# Patient Record
Sex: Female | Born: 1945 | Race: Black or African American | Hispanic: No
Health system: Midwestern US, Academic
[De-identification: ages and names within clinical notes are randomized; demographics above are authoritative.]

---

## 2018-05-26 ENCOUNTER — Encounter: Admit: 2018-05-26 | Discharge: 2018-05-26 | Payer: MEDICARE

## 2018-05-26 ENCOUNTER — Encounter: Admit: 2018-05-26 | Discharge: 2018-05-27 | Payer: MEDICARE

## 2018-05-26 DIAGNOSIS — R69 Illness, unspecified: Secondary | ICD-10-CM

## 2018-06-08 ENCOUNTER — Encounter: Admit: 2018-06-08 | Discharge: 2018-06-08 | Payer: MEDICARE

## 2018-06-10 ENCOUNTER — Encounter: Admit: 2018-06-10 | Discharge: 2018-06-11 | Payer: MEDICARE

## 2018-06-10 DIAGNOSIS — R69 Illness, unspecified: Principal | ICD-10-CM

## 2018-06-13 ENCOUNTER — Encounter: Admit: 2018-06-13 | Discharge: 2018-06-13 | Payer: MEDICARE

## 2018-06-13 DIAGNOSIS — R69 Illness, unspecified: Principal | ICD-10-CM

## 2018-06-16 ENCOUNTER — Encounter: Admit: 2018-06-16 | Discharge: 2018-06-16 | Payer: MEDICARE

## 2018-06-16 DIAGNOSIS — F419 Anxiety disorder, unspecified: ICD-10-CM

## 2018-06-16 DIAGNOSIS — I519 Heart disease, unspecified: ICD-10-CM

## 2018-06-16 DIAGNOSIS — M1990 Unspecified osteoarthritis, unspecified site: ICD-10-CM

## 2018-06-16 DIAGNOSIS — I10 Essential (primary) hypertension: ICD-10-CM

## 2018-06-16 DIAGNOSIS — R1900 Intra-abdominal and pelvic swelling, mass and lump, unspecified site: ICD-10-CM

## 2018-06-16 DIAGNOSIS — K862 Cyst of pancreas: Principal | ICD-10-CM

## 2018-06-16 DIAGNOSIS — H547 Unspecified visual loss: ICD-10-CM

## 2018-06-16 DIAGNOSIS — G4730 Sleep apnea, unspecified: ICD-10-CM

## 2018-06-16 DIAGNOSIS — R609 Edema, unspecified: ICD-10-CM

## 2018-06-16 DIAGNOSIS — E785 Hyperlipidemia, unspecified: ICD-10-CM

## 2018-06-16 DIAGNOSIS — I6521 Occlusion and stenosis of right carotid artery: ICD-10-CM

## 2018-06-16 DIAGNOSIS — J449 Chronic obstructive pulmonary disease, unspecified: ICD-10-CM

## 2018-06-16 DIAGNOSIS — J45909 Unspecified asthma, uncomplicated: ICD-10-CM

## 2018-06-16 DIAGNOSIS — E119 Type 2 diabetes mellitus without complications: ICD-10-CM

## 2018-06-16 DIAGNOSIS — M549 Dorsalgia, unspecified: ICD-10-CM

## 2018-06-16 DIAGNOSIS — C55 Malignant neoplasm of uterus, part unspecified: Principal | ICD-10-CM

## 2018-06-16 DIAGNOSIS — I2510 Atherosclerotic heart disease of native coronary artery without angina pectoris: ICD-10-CM

## 2018-06-16 NOTE — Progress Notes
Neurological: Positive for dizziness.   Psychiatric/Behavioral: Negative.          Objective:         ??? albuterol 0.083% (PROVENTIL) 2.5 mg /3 mL (0.083 %) nebulizer solution Inhale 2.5 mg by mouth into the lungs every 6 hours as needed.   ??? aspirin 325 mg tablet Take 325 mg by mouth.   ??? carvediloL (COREG) 25 mg tablet    ??? clopiDOGrel (PLAVIX) 75 mg tablet    ??? cyanocobalamin (vitamin B-12) (VITAMIN B-12 PO) Take  by mouth.   ??? dicyclomine (BENTYL) 10 mg capsule    ??? ferrous sulfate (FEOSOL) 325 mg (65 mg iron) tablet Take 325 mg by mouth daily. Take on an empty stomach at least 1 hour before or 2 hours after food.   ??? FLUoxetine (PROZAC) 20 mg capsule    ??? furosemide (LASIX) 40 mg tablet 1 Tab, EVERY OTHER DAY, PO, 45 Tab, 2 Number of Refills, 2, Route to Pharmacy Electronically, [facility], [id number]   ??? hydrALAZINE (APRESOLINE) 25 mg tablet 1 Tab, TID, PO, 270 Tab, 3 Number of Refills, 3, Route to Pharmacy Electronically, [id number]   ??? insulin aspart U-100 (NOVOLOG) 100 unit/mL injection Inject  under the skin.   ??? lisinopril (ZESTRIL) 10 mg tablet    ??? lovastatin (MEVACOR) 40 mg tablet    ??? metFORMIN (GLUCOPHAGE) 500 mg tablet    ??? potassium citrate (UROCIT-K) 10 mEq (1,080 mg) tablet Take 10 mEq by mouth.   ??? torsemide (DEMADEX) 20 mg tablet      Vitals:    06/16/18 1305   BP: 137/61   BP Source: Arm, Right Upper   Patient Position: Sitting   Pulse: 87   Resp: 18   Temp: 36.8 ???C (98.2 ???F)   TempSrc: Oral   SpO2: 100%   Weight: 93.9 kg (207 lb)   Height: 157.5 cm (62)   PainSc: Zero     Body mass index is 37.86 kg/m???.     Pain Score: Zero       Fatigue Scale: 0-None    Pain Addressed:  N/A    Patient Evaluated for a Clinical Trial: No treatment clinical trial available for this patient.     [place] Cooperative Oncology Group performance status is 2, Ambulatory and capable of all selfcare but unable to carry out any work activities. Up

## 2018-06-23 ENCOUNTER — Encounter: Admit: 2018-06-23 | Discharge: 2018-06-23 | Payer: MEDICARE

## 2018-11-14 ENCOUNTER — Encounter: Admit: 2018-11-14 | Discharge: 2018-11-14

## 2018-11-14 DIAGNOSIS — K862 Cyst of pancreas: Secondary | ICD-10-CM

## 2018-11-16 LAB — CREATININE

## 2018-11-16 LAB — BUN

## 2018-11-17 ENCOUNTER — Encounter: Admit: 2018-11-17 | Discharge: 2018-11-17

## 2018-11-17 DIAGNOSIS — K862 Cyst of pancreas: Secondary | ICD-10-CM

## 2018-12-08 ENCOUNTER — Encounter: Admit: 2018-12-08 | Discharge: 2018-12-08

## 2019-03-18 ENCOUNTER — Encounter: Admit: 2019-03-18 | Discharge: 2019-03-18 | Payer: MEDICARE

## 2020-01-05 ENCOUNTER — Encounter: Admit: 2020-01-05 | Discharge: 2020-01-05 | Payer: MEDICARE

## 2020-01-05 DIAGNOSIS — C9000 Multiple myeloma not having achieved remission: Secondary | ICD-10-CM

## 2020-01-05 NOTE — Telephone Encounter
Heme/BMT Navigation Intake Assessment Document    Patient Name:  Raven [name]  DOB:  11-Feb-1946    Date of Referral:  01/05/20    Diagnosis & Reason for Visit:  Transplant/Treatment Options - Myeloma       [place]-MR:  [id number] APPOINTMENT:  Future Appointments   Date Time Provider Department Center   01/24/2020  9:10 AM Pauletta Browns, MD [facility] [place] Exam   01/24/2020 10:15 AM BMT LAB [facility] [place] [facility]   01/24/2020 10:30 AM Johnnye Lana [place] Exam   01/24/2020 11:00 AM Pincus Large, LCSW [facility] [place] Exam     REFERRING PHYSICIAN:  Dr. Lyndee Hensen       FACILITY:  [facility]        CONTACT NAME:  Oleh Genin   PHONE:  [phone]                        INSURANCE:   [employer]     Location of Films:  PACS    Location of Pathology:  MAILED/COURIER and Patient notified outside pathology slides will be obtained for review by [place] pathologist and a facility and professional fee will be billed to their insurance.    NEEDS Assessment:    Genetic Counseling:  Genetic Assessment: Not assessed at this time        Nutrition:  Additional Nutrition Assessment: Not assessed at this time     Social & Financial:  Social and Financial Assessment: Reports adequate support system;No needs identified     Social and Financial Intervention: Referral placed to Child psychotherapist;Referral placed to Financial Counselor;Provided information about available services    Spiritual & Emotional:  Spiritual and Emotional Assessment: Reports adequate support system;No needs identified  Spiritual and Emotional Intervention: Provided information about available services;Services declined at this time    Physical:  Fall Risk: None identified     Communication:  Communication Barrier: No     Onc Fertility:   Onc Fertility Assessment: Not applicable     Additional Education:  Additional Education Documented: Yes    Patient Education    COVID-19 guidelines reviewed with patient, including: visitor and universal masking policies, and a temperature check at the facility entrance upon arrival.

## 2020-01-08 ENCOUNTER — Encounter: Admit: 2020-01-08 | Discharge: 2020-01-08 | Payer: MEDICARE

## 2020-01-09 ENCOUNTER — Encounter: Admit: 2020-01-09 | Discharge: 2020-01-09 | Payer: MEDICARE

## 2020-01-09 NOTE — Progress Notes
H&E Slides of ACC # unknown, Path Date 12/05/19 & 01/03/19, requested on 01/09/20 from  [facility].    Contact for outside path lab is [phone].     UPS shipping label faxed (fax # [phone]) with shipment tracking # [id number].

## 2020-01-12 ENCOUNTER — Encounter: Admit: 2020-01-12 | Discharge: 2020-01-12 | Payer: MEDICARE

## 2020-01-12 DIAGNOSIS — C9000 Multiple myeloma not having achieved remission: Secondary | ICD-10-CM

## 2020-01-18 ENCOUNTER — Encounter: Admit: 2020-01-18 | Discharge: 2020-01-18 | Payer: MEDICARE

## 2020-01-18 NOTE — Progress Notes
Onc Timeline Overview Note       Patient: Raven [name]   DOB: 02/01/2046  Diagnosis: Multiple myeloma  [place]-MR:  [id number] APPOINTMENT:  01/24/20 Dr. Tasia Catchings   REFERRING PHYSICIAN: Dr. Lyndee Hensen       FACILITY:  [facility]        CONTACT NAME:  Oleh Genin   PHONE:  [phone]                        INSURANCE:  [employer] Medicare   Allergies: Sulfa   Family Hx: Mother: pancreatic cancer. Sister: breast cancer. 2 sisters: uterine cancer. Sister: multiple myeloma   Medical Hx: HTN, DM, Asthma, uterine cancer 2011, sleep apnea, MI x2, anxiety, COPD, CAD, CHF, HLD, osteoarthritis.    Surgical Hx: Angioplasty, bilateral cataract removal, cardiac defibrillator, cardiac cath, coronary artery bypass, tubal ligation, hysterectomy.    Social Hx:  No alcohol or illicit drug use. Former smoker.    [name] is a 74 year old female who was referred to Dr. Donnajean Lopes in 2020 for anemia and CKD. Myeloma labs revealed an IgG kappa monoclonal protein of 1.5 g/dL. BMBX showed only about 5% plasma cells. PET scan was negative for lytic lesions. 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 Her creatinine began to increase and a BMBX was repeated in Aug 2021 showing 15-20% plasma cells.  Skeletal survey again did not show any lesions. UPEP did not reveal any abnormal proteins. Dr. Donnajean Lopes is considering starting CyBorD. It is unclear if her anemia and CKD are related to myeloma or not so he has referred her to discuss treatment recommendations and the role of autologous stem cell transplant.     Timeline of Events:          Multiple myeloma not having achieved remission (HCC)   11/16/2018 Pertinent Labs    WBC: 8.7 RBC: 3.09 HGB: 9.8 HCT: 29 PLT: 263 ANC: 4.1   BUN: 24 Creat: 1.66 Ca: 8.9      12/07/2018 Pertinent Labs    SPEP TP: 7.1 Alb: 4 Abn Prot Band: 1.4   IFES: IgG kappa  IgA: 50 IgG: 1679 IgM: 21   FKLC: 745.4 mg/L FLLC: 12.9 mg/L K/L Ratio: 57.78      01/03/2019 Imaging    Bone Survey  [facility]    IMPRESSION: No lytic or destructive bone lesion.      01/03/2019 Biopsy    BMBX  [facility]    Peripheral blood, bone marrow aspirate, biopsy and clot section:   1. Plasma cell proliferative disorder, with 4-5% kappa light chain restricted plasma cells.   2. Normocellular bone marrow with morphologically normal trilineage hematopoiesis. Unable to assess marrow storage iron.   3. The submitted peripheral blood smear shows increased targets and hypochromic red blood cells.     [place] Red Stain: Negative    mSMART FISH: High risk. 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          01/24/2019 Imaging    PET  [facility]    IMPRESSION:   1. Allowing for limitation of low resolution CT, no CT evidence of an osteolytic FDG avid lesion.   2. Nonspecific focally increased activity of the left lateral pterygoid muscle with associated anterior positioning of the left mandibular condyle. And imaging findings  may be related to muscle activation however consider follow-up CT with contrast if clinically indicated for further evaluation.   3. No suspicious lymphadenopathy.   4. Panniculitis.   5. No FDG activity within 2 small hepatic hypodensities, incompletely characterized within the right hepatic segment 4B and hepatic segment 8. Query for small cysts.   6. Severe pulmonary hypertension, diverticulosis, left internal oblique intramuscular/intermuscular lipoma.      04/12/2019 Pertinent Labs    WBC: 7.8 RBC: 3.66 HGB: 9.7 HCT: 31.2 PLT: 208 Gran#: 4.1   BUN: 44 Creat: 1.9 Ca: 9.9 TP: 7.5 Alb: 3.5   SPEP TP: 6.8 Alb: 3.7 Abn Prot Band: 1.4   IFES: IgG kappa  IgA: 51 IgG: 1821 IgM: 21   FKLC: 872 mg/L FLLC: 13.2 mg/L Ratio: 66.06      06/28/2019 Pertinent Labs    SPEP TP: 6.8 Alb: 3.8 Abn Prot Band: 1.4   IFES: IgG kappa  IgA: 45 IgG: 1839 IgM: 20   FKLC: 886.3 mg/L FLLC: 16.3 mg/L K/L Ratio: 54.37 08/23/2019 Pertinent Labs    WBC: 7.6 RBC: 3.14 HGB: 8.7 HCT: 27.8 PLT: 293 Gran#: 4.4   BUN: 35 Creat: 2.4 Ca: 10.1 TP: 7.4 Alb: 3.6   SPEP TP: 7 Alb: 3.8 Abn Prot Band: 1.4   IFES: IgG kappa   IgA: 45 IgG: 1720 IgM: 17  FKLC: 952.3 mg/L FLLC: 17 mg/L K/L ratio: 56.02      11/01/2019 Pertinent Labs    SPEP TP: 7.8 Alb: 4.1 Abn Prot Band: 1.9   IFES: IgG kappa  IgA: 49 IgG: 2226 IgM: 21  FKLC: 931.9 mg/L FLLC: 25.4 mg/L K/L ratio: 36.69      11/29/2019 Pertinent Labs    WBC: 9.1 RBC: 2.72 HGB: 7.6 HCT: 24.3 PLT: 258 Gran#: 5.7   BUN: 35 Creat: 2.33 Ca: 8.9 TP: 6.7 Alb: 3.3   SPEP TP: 6.7 ALB: 3.5 Abn Prot Band: 1.4   IFES: IgG kappa   IgA: 38 IgG: 1683 IgM: 17  FKLC: 823.4 mg/L FLLC: 17.8 mg/L K/L Ratio: 46.26   B2M: 9.95     12/01/2019 Imaging    Skeletal Survey  [facility]    Impression: Mild to moderate degenerative changes of the cervical, thoracic, or lumbar spine. No destructive or blastic bone lesions. There is cardiomegaly with evidence of mild CHF.      12/05/2019 Biopsy    BMBX  [facility]    Bone marrow aspirate, biopsy, cell clot and peripheral blood:  Peripheral blood with moderate normocytic normochromic anemia.  Moderately hypercellular bone marrow (50-60%) with mild erythroid hyperplasia and 15-20% kappa light chain restricted plasma cells, consistent with plasma cell neoplasm.   Flow Cytometry: Monoclonal plasma cells detected. 2.3%.    Cytogenetics: 46,XX[20]  MM FISH Panel: normal       12/20/2019 Pertinent Labs    Urine Measured Kappa Chains: <1  Urine Measured Lambda Chains: <1    UPEP TP: 247 mg/24hr ALB: 100 No abnormal protein  UIFE: No monoclonal immunoglobulin detected.   Creat 24hr urine: 1.1 g/24hr  Prot/Creat Ratio 224 mg/g     01/03/2020 Pertinent Labs    WBC: 9.4 RBC: 3.1 HGB: 8.8 HCT: 27.1 PLT: 189 Gran#: 6.1   BUN: 52 Creat: 2.75 Ca: 8.8 TP: 7.6 Alb: 3.5 AST: 29 ALT: 40 Alk Phos: 91

## 2020-01-24 ENCOUNTER — Encounter: Admit: 2020-01-24 | Discharge: 2020-01-24 | Payer: MEDICARE

## 2020-01-24 DIAGNOSIS — F419 Anxiety disorder, unspecified: Secondary | ICD-10-CM

## 2020-01-24 DIAGNOSIS — C9000 Multiple myeloma not having achieved remission: Secondary | ICD-10-CM

## 2020-01-24 DIAGNOSIS — I2510 Atherosclerotic heart disease of native coronary artery without angina pectoris: Secondary | ICD-10-CM

## 2020-01-24 DIAGNOSIS — C801 Malignant (primary) neoplasm, unspecified: Secondary | ICD-10-CM

## 2020-01-24 DIAGNOSIS — E785 Hyperlipidemia, unspecified: Secondary | ICD-10-CM

## 2020-01-24 DIAGNOSIS — I10 Essential (primary) hypertension: Secondary | ICD-10-CM

## 2020-01-24 DIAGNOSIS — M1990 Unspecified osteoarthritis, unspecified site: Secondary | ICD-10-CM

## 2020-01-24 DIAGNOSIS — I519 Heart disease, unspecified: Secondary | ICD-10-CM

## 2020-01-24 DIAGNOSIS — I6521 Occlusion and stenosis of right carotid artery: Secondary | ICD-10-CM

## 2020-01-24 DIAGNOSIS — E119 Type 2 diabetes mellitus without complications: Secondary | ICD-10-CM

## 2020-01-24 DIAGNOSIS — M549 Dorsalgia, unspecified: Secondary | ICD-10-CM

## 2020-01-24 DIAGNOSIS — I509 Heart failure, unspecified: Secondary | ICD-10-CM

## 2020-01-24 DIAGNOSIS — N183 Anemia due to stage 3 chronic kidney disease, unspecified whether stage 3a or 3b CKD (HCC): Secondary | ICD-10-CM

## 2020-01-24 DIAGNOSIS — J45909 Unspecified asthma, uncomplicated: Secondary | ICD-10-CM

## 2020-01-24 DIAGNOSIS — G4730 Sleep apnea, unspecified: Secondary | ICD-10-CM

## 2020-01-24 DIAGNOSIS — R609 Edema, unspecified: Secondary | ICD-10-CM

## 2020-01-24 DIAGNOSIS — J449 Chronic obstructive pulmonary disease, unspecified: Secondary | ICD-10-CM

## 2020-01-24 DIAGNOSIS — H547 Unspecified visual loss: Secondary | ICD-10-CM

## 2020-01-24 DIAGNOSIS — C55 Malignant neoplasm of uterus, part unspecified: Secondary | ICD-10-CM

## 2020-01-24 DIAGNOSIS — R1900 Intra-abdominal and pelvic swelling, mass and lump, unspecified site: Secondary | ICD-10-CM

## 2020-01-24 NOTE — Progress Notes
Continuum of Care Case Management Note         Blood and Marrow Transplant Clinic    Plan:   Spoke with patient for new patient visit.    Interventions:  Spoke with patient and her son for new patient visit.  Introduced the social work role on the BMT team.  Patient has a diagnosis of multiple myeloma and has been referred for transplant options.      Patient lives in [place]. Patient's on reports they are right over 30 minutes from [facility] and did not expect to need lodging. SW reviewed distance protocol and indicated medical team determines, family agreed they understood. Discussed the requirement for 24/7 caregiver support. SW will e-mail form for Advance Directive/DPOA, provided education on document and discussed that SW can assist with notarizing. SW briefly discuss financial assistance through [employer]. Patient reported she is unsure if transplant will occur. SW advised grants can be discussed in length once closer to transplant, patient agreed.     SW emailed DPOA and contact information to patient and her son at [email or web address].    Pincus Large LSCSW LCSW

## 2020-01-24 NOTE — Progress Notes
Raven [name] is a 75 y.o. female seen in initial consultation with Dr. Tasia Catchings.  [name], son, accompanied patient.     Diagnosis: IgG Kappa Multiple Myeloma ISS III  Date of diagnosis: 11/2019  Cytogenetics/FISH at diagnosis:   46,XX[20]    Pertinent medical/surgical hx:   Medical Hx: HTN, DM, Asthma, uterine cancer 2011, sleep apnea, MI x2, anxiety, COPD, CAD, CHF, HLD, osteoarthritis.    Surgical Hx: Angioplasty, bilateral cataract removal, cardiac defibrillator, cardiac cath, coronary artery bypass, tubal ligation, hysterectomy.    States only 1 operating kidney has generalized edema  Referring physician/phone: Dr. Emmie Niemann Pendurti??[phone], Dr. Kennedy Bucker Cardiologist  Prior therapy: surgery for uterine cancer  Current therapy: none  Plan: MM labs with Dr. Donnajean Lopes for further investigation  Testing: none today-MM labs to be done with Dr. Donnajean Lopes at her next visit 10/6  Research/studies: not discussed  Donor Options (if applicable): NA Auto  Social Situation:   Social Hx:  No alcohol or illicit drug use. Former smoker.    Lives with son who is retired from the [employer].  Insurance: [employer] education [employer] Medicare  Pretransplant Coordinator: Anell Barr, RN  Primary Transplant Physician: Dr. Tasia Catchings  Team Color: green      NEWS Score:  0  CMI:  ~10    Last Mammogram (Date/where)  [facility], [facility] 2020 now known as [place]  Last Colonoscopy (date)  2018     I have reviewed the distress thermometer and needs assessment, including physical, psychological, social, spiritual and behavioral needs.  The patient completed the assessment during this visit to identify psychosocial needs that may interfere with the patient's plan of care.  I have made the following referrals based on this assessment and discussion with the patient:  Presenter, broadcasting of Engineer, manufacturing, consents, and coordinator contact information given to patient.    Anell Barr, RN

## 2020-01-24 NOTE — Progress Notes
bName: Raven [name]          MRN: [id number]      DOB: 21-Mar-1946      AGE: 74 y.o.   DATE OF SERVICE: 01/24/2020    Subjective:             Reason for Visit:  New CA Pt      Raven [name] is a 74 y.o. female. Here for opinion on plasma cell dyscrasia    Cancer Staging  No matching staging information was found for the patient.    History of Present Illness      Onc Timeline Overview Note       Patient: Raven [name]   DOB: 02-05-2046  Diagnosis: Multiple myeloma  [place]-MR:  [id number] APPOINTMENT:  01/24/20 Dr. Tasia Catchings   REFERRING PHYSICIAN: Dr. Emmie Niemann Pendurti       FACILITY:  [facility]        CONTACT NAME:  Oleh Genin   PHONE:  [phone]                        INSURANCE:  [employer]   Allergies: Sulfa   Family Hx: Mother: pancreatic cancer. Sister: breast cancer. 2 sisters: uterine cancer. Sister: multiple myeloma   Medical Hx: HTN, DM, Asthma, uterine cancer 2011, sleep apnea, MI x2, anxiety, COPD, CAD- s/p angioplasty and CABG 12 years ago, CHF, HLD, osteoarthritis.     Surgical Hx: Angioplasty, bilateral cataract removal, cardiac defibrillator, cardiac cath, coronary artery bypass, tubal ligation, hysterectomy.   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    Social Hx:  No alcohol or illicit drug use. Former smoker.    [name] is a 74 year old female who was referred to Dr. Donnajean Lopes in 2020 for anemia and CKD. Myeloma labs revealed an IgG kappa monoclonal protein of 1.5 g/dL. BMBX showed only about 5% plasma cells. PET scan was negative for lytic lesions. 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 Her creatinine began to increase and a BMBX was repeated in Aug 2021 showing 15-20% plasma cells.  Skeletal survey again did not show any lesions. UPEP did not reveal any abnormal proteins. Dr. Donnajean Lopes is considering starting CyBorD. It is unclear if her anemia and CKD are related to myeloma or not so he has referred her to discuss treatment recommendations and the role of autologous stem cell transplant.     Timeline of Events:          Multiple myeloma not having achieved remission (HCC)   11/16/2018 Pertinent Labs    WBC: 8.7 RBC: 3.09 HGB: 9.8 HCT: 29 PLT: 263 ANC: 4.1   BUN: 24 Creat: 1.66 Ca: 8.9      12/07/2018 Pertinent Labs    SPEP TP: 7.1 Alb: 4 Abn Prot Band: 1.4   IFES: IgG kappa  IgA: 50 IgG: 1679 IgM: 21   FKLC: 745.4 mg/L FLLC: 12.9 mg/L K/L Ratio: 57.78      01/03/2019 Imaging    Bone Survey  [facility]    IMPRESSION: No lytic or destructive bone lesion.      01/03/2019 Biopsy    BMBX  [facility]    Peripheral blood, bone marrow aspirate, biopsy and clot section:   1. Plasma cell proliferative disorder, with 4-5% kappa light chain restricted plasma cells.   2. Normocellular bone marrow with morphologically  normal trilineage hematopoiesis. Unable to assess marrow storage iron.   3. The submitted peripheral blood smear shows increased targets and hypochromic red blood cells.     [place] Red Stain: Negative    mSMART FISH: High risk. 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          01/24/2019 Imaging    PET  [facility]    IMPRESSION:   1. Allowing for limitation of low resolution CT, no CT evidence of an osteolytic FDG avid lesion.   2. Nonspecific focally increased activity of the left lateral pterygoid muscle with associated anterior positioning of the left mandibular condyle. And imaging findings may be related to muscle activation however consider follow-up CT with contrast if clinically indicated for further evaluation.   3. No suspicious lymphadenopathy.   4. Panniculitis.   5. No FDG activity within 2 small hepatic hypodensities, incompletely characterized within the right hepatic segment 4B and hepatic segment 8. Query for small cysts.   6. Severe pulmonary hypertension, diverticulosis, left internal oblique intramuscular/intermuscular lipoma.      04/12/2019 Pertinent Labs    WBC: 7.8 RBC: 3.66 HGB: 9.7 HCT: 31.2 PLT: 208 Gran#: 4.1   BUN: 44 Creat: 1.9 Ca: 9.9 TP: 7.5 Alb: 3.5   SPEP TP: 6.8 Alb: 3.7 Abn Prot Band: 1.4   IFES: IgG kappa  IgA: 51 IgG: 1821 IgM: 21   FKLC: 872 mg/L FLLC: 13.2 mg/L Ratio: 66.06      06/28/2019 Pertinent Labs    SPEP TP: 6.8 Alb: 3.8 Abn Prot Band: 1.4   IFES: IgG kappa  IgA: 45 IgG: 1839 IgM: 20   FKLC: 886.3 mg/L FLLC: 16.3 mg/L K/L Ratio: 54.37      08/23/2019 Pertinent Labs    WBC: 7.6 RBC: 3.14 HGB: 8.7 HCT: 27.8 PLT: 293 Gran#: 4.4   BUN: 35 Creat: 2.4 Ca: 10.1 TP: 7.4 Alb: 3.6   SPEP TP: 7 Alb: 3.8 Abn Prot Band: 1.4   IFES: IgG kappa   IgA: 45 IgG: 1720 IgM: 17  FKLC: 952.3 mg/L FLLC: 17 mg/L K/L ratio: 56.02      11/01/2019 Pertinent Labs    SPEP TP: 7.8 Alb: 4.1 Abn Prot Band: 1.9   IFES: IgG kappa  IgA: 49 IgG: 2226 IgM: 21  FKLC: 931.9 mg/L FLLC: 25.4 mg/L K/L ratio: 36.69      11/29/2019 Pertinent Labs    WBC: 9.1 RBC: 2.72 HGB: 7.6 HCT: 24.3 PLT: 258 Gran#: 5.7   BUN: 35 Creat: 2.33 Ca: 8.9 TP: 6.7 Alb: 3.3   SPEP TP: 6.7 ALB: 3.5 Abn Prot Band: 1.4   IFES: IgG kappa   IgA: 38 IgG: 1683 IgM: 17  FKLC: 823.4 mg/L FLLC: 17.8 mg/L K/L Ratio: 46.26   B2M: 9.95     12/01/2019 Imaging    Skeletal Survey  [facility]    Impression: Mild to moderate degenerative changes of the cervical, thoracic, or lumbar spine. No destructive or blastic bone lesions. There is cardiomegaly with evidence of mild CHF.      12/05/2019 Biopsy    BMBX  [facility]    Bone marrow aspirate, biopsy, cell clot and peripheral blood:  Peripheral blood with moderate normocytic normochromic anemia.  Moderately hypercellular bone marrow (50-60%) with mild erythroid hyperplasia and 15-20% kappa light chain restricted plasma cells, consistent with plasma cell neoplasm.   Flow Cytometry: Monoclonal plasma cells detected. 2.3%.    Cytogenetics: 46,XX[20]  MM FISH Panel: normal       12/20/2019 Pertinent Labs    Urine Measured Kappa Chains: <1  Urine Measured Lambda Chains: <1    UPEP TP: 247 mg/24hr ALB: 100 No abnormal protein  UIFE: No monoclonal immunoglobulin detected.   Creat 24hr urine: 1.1 g/24hr  Prot/Creat Ratio 224 mg/g     01/03/2020 Pertinent Labs    WBC: 9.4 RBC: 3.1 HGB: 8.8 HCT: 27.1 PLT: 189 Gran#: 6.1   BUN: 52 Creat: 2.75 Ca: 8.8 TP: 7.6 Alb: 3.5 AST: 29 ALT: 40 Alk Phos: 91               Review of Systems   Constitutional: Negative.    HENT: Negative.    Eyes: Negative.    Respiratory: Positive for shortness of breath.    Cardiovascular: Positive for leg swelling (mild).   Gastrointestinal: Negative.    Endocrine: Negative.    Genitourinary: Negative.    Musculoskeletal: Negative.    Neurological: Negative.    Hematological: Negative.    Psychiatric/Behavioral: Negative.  Negative for confusion.   All other systems reviewed and are negative.      Objective:         ? albuterol 0.083% (PROVENTIL) 2.5 mg /3 mL (0.083 %) nebulizer solution Inhale 2.5 mg by mouth into the lungs every 6 hours as needed.   ? budesonide-formoterol HFA (SYMBICORT) 160-4.5 mcg/actuation inhalation Inhale 2 puffs by mouth into the lungs twice daily as needed.   ? carvediloL (COREG) 25 mg tablet Take 25 mg by mouth twice daily.   ? cholecalciferol (VITAMIN D) 1,000 units tablet Take 1,000 Units by mouth daily.   ? clopiDOGrel (PLAVIX) 75 mg tablet Take 75 mg by mouth daily.   ? cyanocobalamin (VITAMIN B-12) 1,000 mcg tablet Take 5,000 Units by mouth daily.   ? dicyclomine (BENTYL) 10 mg capsule Take 10 mg by mouth daily.   ? EMU OIL (BULK) MISC Use  as directed. Apply topically as needed   ? ferrous sulfate (FEOSOL) 325 mg (65 mg iron) tablet Take 325 mg by mouth daily. Take on an empty stomach at least 1 hour before or 2 hours after food.   ? FLUoxetine (PROZAC) 20 mg capsule Take 20 mg by mouth daily.   ? furosemide (LASIX) 40 mg tablet Take 20 mg by mouth every 48 hours.   ? hydrALAZINE (APRESOLINE) 50 mg tablet Take 50 mg by mouth three times daily. ? insulin aspart U-100 (NOVOLOG) 100 unit/mL injection Inject  under the skin. Per sliding scale   ? insulin detemir U-100(+) (LEVEMIR) 100 unit/mL vial Inject 25 Units under the skin at bedtime daily.   ? isosorbide mononitrate ER (IMDUR) 30 mg tablet Take 30 mg by mouth daily.   ? lisinopriL (ZESTRIL) 5 mg tablet Take 5 mg by mouth daily.   ? magnesium oxide (MAGOX) 400 mg (241.3 mg magnesium) tablet Take 400 mg by mouth daily.   ? nitroglycerin (NITROSTAT) 0.3 mg tablet Place 0.3 mg under tongue every 5 minutes as needed for Chest Pain.   ? potassium citrate (UROCIT-K) 10 mEq (1,080 mg) tablet Take 10 mEq by mouth daily.   ? rosuvastatin (CRESTOR) 20 mg tablet Take 20 mg by mouth daily.   ? torsemide (DEMADEX) 20 mg tablet Take 20 mg by mouth daily.       BP 138/74 (BP Source: Arm, Left Upper, Patient Position: Sitting)  - Pulse 73  - Temp 36.9 ?C (98.4 ?F) (Oral)  -  Resp 14  - Ht 158.8 cm (62.5)  - Wt 92.7 kg (204 lb 6.4 oz)  - SpO2 100%  - BMI 36.79 kg/m?   Wt Readings from Last 5 Encounters:   01/24/20 92.7 kg (204 lb 6.4 oz)   06/16/18 93.9 kg (207 lb)     Body mass index is 36.79 kg/m?Marland Kitchen     Pain Score: Zero       Fatigue Scale: 0-None    Pain Addressed:  N/A    Patient Evaluated for a Clinical Trial: No treatment clinical trial available for this patient.     Karnofsky Scale: 70% Cares for self; unable to do normal activity, or active work     Physical Exam  Vitals and nursing note reviewed.   HENT:      Head: Normocephalic and atraumatic.   Eyes:      Conjunctiva/sclera: Conjunctivae normal.   Cardiovascular:      Rate and Rhythm: Normal rate and regular rhythm.   Pulmonary:      Effort: Pulmonary effort is normal. No respiratory distress.      Breath sounds: Normal breath sounds.   Abdominal:      General: Bowel sounds are normal. There is no distension.      Palpations: Abdomen is soft.      Tenderness: There is no abdominal tenderness.   Musculoskeletal:         General: Swelling (mild) present. No tenderness. Normal range of motion.      Cervical back: Normal range of motion.   Skin:     General: Skin is warm.   Neurological:      Mental Status: She is alert and oriented to person, place, and time.      Gait: Gait is intact.   Psychiatric:         Mood and Affect: Affect normal.               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   Her anemia is likely related to CKD, and not to gammopathy.   She is on EPO and has not required transfusion for 2 months.     She has 2 of 3  Mayo 2018 criteria for high risk smoldering MM  Based on labs in August 2021 and time to progression is estimated around 29 months.   cytogenetics: Gain of 1q.     Smoldering MM high risk is defined as BM biopsy PC > 10%, IgG > 3gm/ IgA > 2gm, Bence Jones > 1gm.   The PETHEMA trial demonstrated a delayed Time to progression and improved PFS with revlimid/ dex in high risk smoldering MM setting, however no survival benefit was demonstrated. In her case, given multiple comorbidities, would agree with observation.               Active MM would be determined in this pt based on end organ involvement.   In her case, it is not clear that her renal insufficiency is related to gammopathy, given other comorbidities  A renal biopsy would help establish a diagnosis. Negative Bence jones favors other etiologies of renal failure.    If her renal function continues to decline despite optimal medical management  by nephrology, would consider a renal biopsy or empiric treatment as active Myeloma with CyborD regimen or revlimid (renally adjusted dosing) with velcade and dex.    If her anemia workup is negative and not responding to EPO/ trt for CKD, then it is reasonable to consider this as active myeloma and treat as above.         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   Anemia: Workup: Anemia of CKD  On retacrit.     FEN/Renal:  Evaluated creatinine-will monitor closely and renally dose meds, fluid as needed.    Endocrine:  N/A   DM    Cardiovascular:  CHF - On treatment.  Monitor   Imaging showing cardiomegaly and mild CHF  Recently had increased dose of diuretics.      Infectious Disease:  Continue prophylaxis anti-infectives    Pulmonary:  Shortness of Air   Likely sec to CHF    GI:  No active issues related to nausea, emesis or diarrhea    Integumentary:  N/A    Pain:  N/A    Psychology:  No active issues, monitor and offer support as needed.     Time 10 min chart review in clinic today, labs, pertinent imaging, prior provider note reviewed, 20 min with pt face to face performing ROS, PE and discussing current status and assessment and plan, 10 minutes post visit management and care coordination in clinic today. Total time = 60 min.  Discussed with Dr Donnajean Lopes.       Pauletta Browns, MD  Assistant Professor  Hematologic Malignancies and Cellular Therapy

## 2020-01-31 ENCOUNTER — Encounter: Admit: 2020-01-31 | Discharge: 2020-01-31 | Payer: MEDICARE

## 2020-01-31 NOTE — Telephone Encounter
Spoke with patient and son, who were getting ready for a visit with Dr. Donnajean Lopes today.  Notified them that Dr. Tasia Catchings spoke with Dr. Donnajean Lopes, and that Dr. Donnajean Lopes would be referring the patient to a nephrologist for further investigation into her kidney issues.  Also informed patient and son that if Dr. Donnajean Lopes would like the patient to be considered for a ASCT in the future that he will place another referral at that time.  Both patient and son verbalized understanding.

## 2022-06-05 ENCOUNTER — Encounter: Admit: 2022-06-05 | Discharge: 2022-06-05 | Payer: MEDICARE

## 2022-06-09 ENCOUNTER — Encounter: Admit: 2022-06-09 | Discharge: 2022-06-09 | Payer: MEDICARE

## 2022-06-11 ENCOUNTER — Encounter: Admit: 2022-06-11 | Discharge: 2022-06-11 | Payer: MEDICARE

## 2022-06-11 NOTE — Progress Notes
.  This prechart is intended to be a reference for patient appointments. Information is gathered from in chart as well as external records review. Information will be clarified/verified/updated in final documentation in the office visit.     Pre Clinic Pre Chart:    Problem List:  Rectal stricture  Multiple myeloma  Endometrial cancer    06/04/2022 - Med Onc:  11. She does continue to have issues with diverticulitis. She was actually admitted to the hospital in December 2023 for possible diverticulitis. She did have a positive fecal occult blood test at that time. CT scan done on April 16, 2022 showed a left anterior chest wall mass increasing in size with further ultrasound noting it to be either hematoma or possible abscess. General surgery was consulted and they did start her on IV Levaquin for possible chest wall abscess. She did subsequently undergo colonoscopy on June 03, 2022 for evaluation of a possible colonic mass. Due to significant tortuous sigmoid colon along with edema they were not able to reach the area of stricture. Patient then underwent a barium enema which showed short segment irregular lobulated circumferential wall thickening in the mid to proximal sigmoid colon measuring at least 4.7 cm in length with proximal and distal apple core morphology. Suspicious for malignant stricture. I will reach out to Dr. Broadus John regarding the plan for ongoing workup of this abnormality.     06/03/2022 - Barium Enema:  1.  Findings overall suspicious for malignant stricture extended short segment polypoid sigmoid colonic wall thickening concerning for neoplastic etiology.  Correlation with colonoscopy findings recommended.    06/03/2022 - Colonoscopy:  The colonoscope was advanced up to the sigmoid colon.  The findings are noted above.  The scope could not safely be passed any further.  The scope was slowly withdrawn and the entire mucosal surface distal was inspected.  Including retroflexion in the rectum. Scope was removed and barium enema was ordered.  Findings:  Because of the strain tortuosity of the sigmoid colon and edema, we are unable to reach the lesion seen on CT scan.  There was severe diverticular disease, but again could not not reach the level of the strictured area.    04/16/2022 - CT Abd/Pelv:  Left anterior chest wall mass has significantly increased in size, measuring 4.3 x 3.4 cm in diameter.  Differential consideration would include primary soft tissue neoplasm or metastasis.  An abscess is less likely consideration although maintained in the differential.  There are no pulmonary nodules.  There is no adenopathy.  Small nodular pancreatic tail lesion appears unchanged.  There is associated wall thickening mass or masslike inflammation of the proximal sigmoid colon which appears to be similar to the prior study.  This is suspicious for colon neoplasm.  Atypical appearance of diverticulitis is less likely consideration.

## 2022-06-16 ENCOUNTER — Encounter: Admit: 2022-06-16 | Discharge: 2022-06-16 | Payer: MEDICARE

## 2022-06-16 DIAGNOSIS — C55 Malignant neoplasm of uterus, part unspecified: Secondary | ICD-10-CM

## 2022-06-16 DIAGNOSIS — F419 Anxiety disorder, unspecified: Secondary | ICD-10-CM

## 2022-06-16 DIAGNOSIS — E119 Type 2 diabetes mellitus without complications: Secondary | ICD-10-CM

## 2022-06-16 DIAGNOSIS — I519 Heart disease, unspecified: Secondary | ICD-10-CM

## 2022-06-16 DIAGNOSIS — J449 Chronic obstructive pulmonary disease, unspecified: Secondary | ICD-10-CM

## 2022-06-16 DIAGNOSIS — M549 Dorsalgia, unspecified: Secondary | ICD-10-CM

## 2022-06-16 DIAGNOSIS — E785 Hyperlipidemia, unspecified: Secondary | ICD-10-CM

## 2022-06-16 DIAGNOSIS — J45909 Unspecified asthma, uncomplicated: Secondary | ICD-10-CM

## 2022-06-16 DIAGNOSIS — I6521 Occlusion and stenosis of right carotid artery: Secondary | ICD-10-CM

## 2022-06-16 DIAGNOSIS — R609 Edema, unspecified: Secondary | ICD-10-CM

## 2022-06-16 DIAGNOSIS — M1990 Unspecified osteoarthritis, unspecified site: Secondary | ICD-10-CM

## 2022-06-16 DIAGNOSIS — I10 Essential (primary) hypertension: Secondary | ICD-10-CM

## 2022-06-16 DIAGNOSIS — I2510 Atherosclerotic heart disease of native coronary artery without angina pectoris: Secondary | ICD-10-CM

## 2022-06-16 DIAGNOSIS — C9000 Multiple myeloma not having achieved remission: Secondary | ICD-10-CM

## 2022-06-16 DIAGNOSIS — R1900 Intra-abdominal and pelvic swelling, mass and lump, unspecified site: Secondary | ICD-10-CM

## 2022-06-16 DIAGNOSIS — C801 Malignant (primary) neoplasm, unspecified: Secondary | ICD-10-CM

## 2022-06-16 DIAGNOSIS — K6389 Other specified diseases of intestine: Secondary | ICD-10-CM

## 2022-06-16 DIAGNOSIS — H547 Unspecified visual loss: Secondary | ICD-10-CM

## 2022-06-16 DIAGNOSIS — G4730 Sleep apnea, unspecified: Secondary | ICD-10-CM

## 2022-06-16 NOTE — Progress Notes
Name: Raven [name]          MRN: [id number]      DOB: 05-27-1945      AGE: 77 y.o.   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 The patient presents for evaluation of a colonic stricture identified on a Dec '23 CT scan for a chest wall mass.  This was interpreted as wall thickening mass or masslike inflammation of the proximal sigmoid colon.  A colonoscopy was attempted 06/03/22 but they were not able to pass thru the sigmoid because of tortuosity and edema.  Severe diverticular disease was noted and no mass visualized.  Barium enema with a moderate length segment of narrowing in the sigmoid read as concerning for neoplasm.    The patient reports alternating constipation and loose stools, with bowel movements occurring every one to three days on average. There are no regular symptoms of nausea, vomiting, or signs of obstruction. However, the patient was hospitalized approximately 1.5 months ago with vomiting and abdominal pain, suspected to be a diverticular flare, and was treated with antibiotics.    The patient has a history of abdominal surgeries, including a laparoscopic cholecystectomy and a robotic total abdominal hysterectomy with bilateral salpingo-oophorectomy for endometrial cancer. The patient denies any family history of colon cancer.    The patient also reports a history of multiple episodes of diverticulitis, which have been increasing in frequency recently. The patient has been experiencing early satiety and selective appetite, often preferring smaller meals or specific foods. The patient also reports occasional dizziness and shortness of breath, particularly when attempting to walk long distances or climb stairs.    The patient lives with their son and his wife and uses a mobility aid due to difficulty walking long distances. The patient has had a few falls in the past six months when feeling unwell. The patient is currently receiving monthly chemotherapy for multiple myeloma and takes insulin for diabetes. The patient also has a history of sleep apnea and high blood pressure but denies any history of smoking.     Medical History:   Diagnosis Date    Anxiety     Arthritis     Asthma     Asymptomatic stenosis of right carotid artery     Back pain     Cancer of uterus (HCC)     COPD (chronic obstructive pulmonary disease) (HCC)     Coronary artery disease     Diabetes mellitus (HCC)     Dyslipidemia     Flank mass     Fluid retention     Heart disease     Hypertension     Other malignant neoplasm without specification of site     Sleep apnea     Vision decreased      Surgical History:   Procedure Laterality Date    COLONOSCOPY      HX HEART CATHETERIZATION       Family History   Problem Relation Age of Onset    Cancer Mother  Diabetes Mother     Hypertension Mother     Cancer-Breast Sister     Cancer-Ovarian Sister     Cancer Sister     Diabetes Sister     Heart Disease Sister     Hypertension Sister     Heart Disease Brother     Hypertension Brother     Stroke Brother      Social History     Socioeconomic History    Marital status: Widowed   Tobacco Use    Smoking status: Former     Current packs/day: 0.00     Average packs/day: 1 pack/day for 20.0 years (20.0 ttl pk-yrs)     Types: Cigarettes     Start date: 11     Quit date: 2010     Years since quitting: 14.1    Smokeless tobacco: Never   Substance and Sexual Activity    Alcohol use: Not Currently    Drug use: Not Currently        Review of Systems   Constitutional: Negative.    HENT: Negative.     Eyes: Negative.    Respiratory: Negative.     Cardiovascular: Negative.    Gastrointestinal: Negative.    Endocrine: Negative.    Genitourinary: Negative.    Musculoskeletal: Negative.    Allergic/Immunologic: Negative.    Neurological: Negative.    Hematological: Negative.    Psychiatric/Behavioral: Negative.       Objective:          acyclovir (ZOVIRAX) 400 mg tablet TAKE 1 TABLET BY MOUTH IN THE MORNING AND 1 AT BEDTIME    albuterol 0.083% (PROVENTIL) 2.5 mg /3 mL (0.083 %) nebulizer solution Inhale 3 mL by mouth into the lungs every 6 hours as needed.    albuterol-ipratropium (DUONEB) 0.5 mg-3 mg(2.5 mg base)/3 mL nebulizer solution USE 3 ML VIA NEBULIZER EVERY 6 TO 8 HOURS AS NEEDED FOR SHORTNESS OF BREATH OR WHEEZING    amoxicillin-potassium clavulanate (AUGMENTIN) 875/125 mg tablet Take one tablet by mouth twice daily. for 5 days    benzonatate (TESSALON PERLES) 100 mg capsule TAKE 1 CAPSULE BY MOUTH EVERY 8 HOURS FOR 7 DAYS    budesonide-formoterol HFA (SYMBICORT) 160-4.5 mcg/actuation inhalation Inhale two puffs by mouth into the lungs twice daily as needed.    carvediloL (COREG) 25 mg tablet Take one tablet by mouth twice daily.    carvediloL (COREG) 6.25 mg tablet TAKE 1 TABLET BY MOUTH WITH BREAKFAST AND WITH EVENING MEAL    cholecalciferol (VITAMIN D) 1,000 units tablet Take one tablet by mouth daily.    clopiDOGrel (PLAVIX) 75 mg tablet Take one tablet by mouth daily.    cyanocobalamin (VITAMIN B-12) 1,000 mcg tablet Take 5,000 Units by mouth daily.    dexAMETHasone (DECADRON) 4 mg tablet TAKE 5 TABLETS(20 MG) BY MOUTH WEEKLY    dicyclomine (BENTYL) 10 mg capsule Take one capsule by mouth daily.    diphenhydrAMINE HCL (BENADRYL) 25 mg capsule Take one capsule by mouth nightly as needed.    EMU OIL (BULK) MISC Use  as directed. Apply topically as needed    famotidine (PEPCID) 20 mg tablet Take one tablet by mouth daily.    febuxostat (ULORIC) 40 mg tablet TAKE 1/2 TABLET(20 MG) BY MOUTH DAILY    ferrous sulfate (FEOSOL) 325 mg (65 mg iron) tablet Take one tablet by mouth daily. Take on an empty stomach at least 1 hour before or 2 hours after food.    FLUoxetine (  PROZAC) 20 mg capsule Take one capsule by mouth daily.    furosemide (LASIX) 40 mg tablet Take one-half tablet by mouth every 48 hours.    hydrALAZINE (APRESOLINE) 50 mg tablet Take one tablet by mouth three times daily.    HYDROcodone/acetaminophen (NORCO) 7.5/325 mg tablet as Needed.    insulin aspart U-100 (NOVOLOG) 100 unit/mL injection Inject  under the skin. Per sliding scale    insulin detemir U-100(+) (LEVEMIR) 100 unit/mL vial Inject twenty five Units under the skin at bedtime daily.    isosorbide mononitrate ER (IMDUR) 30 mg tablet Take one tablet by mouth daily.    lisinopriL (ZESTRIL) 5 mg tablet Take one tablet by mouth daily.    LORazepam (ATIVAN) 1 mg tablet Take one-half tablet by mouth.    magnesium oxide (MAGOX) 400 mg (241.3 mg magnesium) tablet Take one tablet by mouth daily.    metOLazone (ZAROXOLYN) 5 mg tablet Take one tablet by mouth DAILY PRN.    metoprolol succinate XL (TOPROL XL) 25 mg extended release tablet Take one tablet by mouth daily.    midodrine (PROAMATINE) 5 mg tablet Take two tablets by mouth twice daily as needed.    nitroglycerin (NITROSTAT) 0.3 mg tablet Place one tablet under tongue every 5 minutes as needed for Chest Pain.    ondansetron HCL (ZOFRAN) 4 mg tablet     ONETOUCH VERIO TEST STRIPS test strip USE STRIP TO CHECK GLUCOSE TWICE DAILY FOR DIABETES MELLITUS    oxyCODONE-acetaminophen (PERCOCET) 5-325 mg tablet TAKE 1 TABLET BY MOUTH EVERY 6 HOURS AS NEEDED FOR MODERATE PAIN UP TO 5 DAYS. DO NOT EXCEED 4 PER 24 HOURS    potassium chloride SR (K-DUR) 20 mEq tablet TAKE 2 TABLETS BY MOUTH ONCE DAILY AS DIRECTED    potassium citrate (UROCIT-K) 10 mEq (1,080 mg) tablet Take one tablet by mouth daily.    prochlorperazine maleate (COMPAZINE) 10 mg tablet Take one tablet by mouth every 6 hours as needed.    rosuvastatin (CRESTOR) 20 mg tablet Take one tablet by mouth daily.    sertraline (ZOLOFT) 50 mg tablet Take one tablet by mouth daily.    torsemide (DEMADEX) 20 mg tablet Take one tablet by mouth daily.     Vitals:    06/16/22 1139   BP: 133/79   BP Source: Arm, Right Upper   Pulse: 87   Temp: (!) 35.9 ?C (96.7 ?F)   Resp: 18   SpO2: 99%   TempSrc: Temporal   PainSc: Zero   Weight: 78.6 kg (173 lb 3.2 oz)   Height: 157.5 cm (5' 2)     Body mass index is 31.68 kg/m?Marland Kitchen     Pain Score: Zero       Fatigue Scale: 0-None     Physical Exam  Constitutional:       Appearance: Normal appearance.   HENT:      Head: Normocephalic and atraumatic.      Mouth/Throat:      Mouth: Mucous membranes are moist.      Pharynx: Oropharynx is clear.   Eyes:      Extraocular Movements: Extraocular movements intact.      Conjunctiva/sclera: Conjunctivae normal.   Cardiovascular:      Rate and Rhythm: Normal rate.      Pulses: Normal pulses.   Pulmonary:      Effort: Pulmonary effort is normal. No respiratory distress.   Abdominal:      General: Abdomen is flat. There is no distension.  Palpations: Abdomen is soft.   Musculoskeletal:         General: Normal range of motion.      Cervical back: Normal range of motion.   Skin:     General: Skin is warm and dry.   Neurological:      General: No focal deficit present.      Mental Status: She is alert and oriented to person, place, and time.   Psychiatric:         Mood and Affect: Mood normal.         Behavior: Behavior normal.         Assessment and Plan:  Sigmoid Stricture: Patient has a history of diverticulitis and recent imaging revealed a sigmoid stricture that is 4.7 cm in length on barium enema 06/03/22. CT AP on 04/16/22 also showing wall thickening of sigmoid colon that could represent a mass. This area was unable to be evaluated on colonoscopy 06/03/22. Discussed repeating colonoscopy would probably be inconclusive again. The surgery to remove this mass like thickening would require a sigmoidectomy and possible temporary diverting ostomy. Discussed she would be at a high risk of complications postoperatively including death given her significant medical co morbidities.  The ACS risk calculator was used to guide this part of the discussion.  - Refer to a dietician to improve nutritional status.  Her albumin was 2.5 on 06/04/22.  She reports eating one meal a day.  - Return to cardiology for a risk assessment and possible optimization standpoint.  - I will have her return in ~4 weeks to see if she can make progress and become a surgical candidate.  I could potentially offer her a sigmoid colectomy with permanent colostomy in her future state.  Perhaps she can improve her functional status and an anastomosis could be performed +/- DLI.  That remains to be seen.  I understand there is concern for malignancy but in my review of the CT scan, endoscopy reports, and gastrograffin enema this appears more consistent with chronic diverticular disease.  Hence, I do not feel an urge to push operating at this time.  - Will re-evaluate nutritional status with CBC, CMP and pre-albumin at follow-up appointment    Coronary Artery Disease (CAD) and Heart Failure: Patient has a history of CAD, status post coronary artery bypass graft (CABG), and heart failure with reduced ejection fraction, status post implantable cardioverter-defibrillator (ICD) placement.  -Schedule a return cardiology visit to assess the patient's current cardiac status and potential risks associated with surgical intervention.    Malnutrition: Patient's albumin level is low, indicating potential malnutrition.  - Refer to a dietician to improve nutritional status, particularly albumin levels.  - Consider supplementation with nutritional shakes like Ensure or Boost.    Follow-up in four weeks with labs to reassess the patient's nutritional status, cardiac status, and potential for surgical intervention.       Deedra Ehrich, MD  Colon and Rectal Surgery  Pager: [phone]

## 2022-06-16 NOTE — Progress Notes
Pt stated  she has had dizzy spells causing her to lose balance

## 2022-06-23 ENCOUNTER — Encounter: Admit: 2022-06-23 | Discharge: 2022-06-23 | Payer: MEDICARE

## 2022-07-07 ENCOUNTER — Encounter: Admit: 2022-07-07 | Discharge: 2022-07-07 | Payer: MEDICARE

## 2022-07-09 ENCOUNTER — Encounter: Admit: 2022-07-09 | Discharge: 2022-07-09 | Payer: MEDICARE

## 2022-07-14 ENCOUNTER — Encounter: Admit: 2022-07-14 | Discharge: 2022-07-14 | Payer: MEDICARE

## 2022-07-14 ENCOUNTER — Inpatient Hospital Stay: Admit: 2022-07-14 | Discharge: 2022-07-14 | Payer: MEDICARE

## 2022-07-14 DIAGNOSIS — E785 Hyperlipidemia, unspecified: Secondary | ICD-10-CM

## 2022-07-14 DIAGNOSIS — I6521 Occlusion and stenosis of right carotid artery: Secondary | ICD-10-CM

## 2022-07-14 DIAGNOSIS — J449 Chronic obstructive pulmonary disease, unspecified: Secondary | ICD-10-CM

## 2022-07-14 DIAGNOSIS — C9000 Multiple myeloma not having achieved remission: Secondary | ICD-10-CM

## 2022-07-14 DIAGNOSIS — M549 Dorsalgia, unspecified: Secondary | ICD-10-CM

## 2022-07-14 DIAGNOSIS — J45909 Unspecified asthma, uncomplicated: Secondary | ICD-10-CM

## 2022-07-14 DIAGNOSIS — R609 Edema, unspecified: Secondary | ICD-10-CM

## 2022-07-14 DIAGNOSIS — K6389 Other specified diseases of intestine: Secondary | ICD-10-CM

## 2022-07-14 DIAGNOSIS — E119 Type 2 diabetes mellitus without complications: Secondary | ICD-10-CM

## 2022-07-14 DIAGNOSIS — C55 Malignant neoplasm of uterus, part unspecified: Secondary | ICD-10-CM

## 2022-07-14 DIAGNOSIS — H547 Unspecified visual loss: Secondary | ICD-10-CM

## 2022-07-14 DIAGNOSIS — C801 Malignant (primary) neoplasm, unspecified: Secondary | ICD-10-CM

## 2022-07-14 DIAGNOSIS — I10 Essential (primary) hypertension: Secondary | ICD-10-CM

## 2022-07-14 DIAGNOSIS — F419 Anxiety disorder, unspecified: Secondary | ICD-10-CM

## 2022-07-14 DIAGNOSIS — R1900 Intra-abdominal and pelvic swelling, mass and lump, unspecified site: Secondary | ICD-10-CM

## 2022-07-14 DIAGNOSIS — I519 Heart disease, unspecified: Secondary | ICD-10-CM

## 2022-07-14 DIAGNOSIS — G4730 Sleep apnea, unspecified: Secondary | ICD-10-CM

## 2022-07-14 DIAGNOSIS — I2510 Atherosclerotic heart disease of native coronary artery without angina pectoris: Secondary | ICD-10-CM

## 2022-07-14 DIAGNOSIS — M1990 Unspecified osteoarthritis, unspecified site: Secondary | ICD-10-CM

## 2022-07-14 LAB — PREALBUMIN: PREALBUMIN: 21 mg/dL (ref 17–34)

## 2022-07-14 LAB — CBC
HEMATOCRIT: 30 % — ABNORMAL LOW (ref 36–45)
HEMOGLOBIN: 9.6 g/dL — ABNORMAL LOW (ref 12.0–15.0)
MCH: 31 pg (ref 26–34)
MCHC: 31 g/dL — ABNORMAL LOW (ref 32.0–36.0)
MCV: 99 FL — ABNORMAL HIGH (ref 80–100)
MPV: 9 FL (ref 7–11)
PLATELET COUNT: 285 K/UL (ref 150–400)
RBC COUNT: 3 M/UL — ABNORMAL LOW (ref 4.0–5.0)
RDW: 22 % — ABNORMAL HIGH (ref 11–15)
WBC COUNT: 11 K/UL (ref 4.5–11.0)

## 2022-07-14 LAB — COMPREHENSIVE METABOLIC PANEL
ALT: 18 U/L (ref 7–56)
ANION GAP: 7 (ref 3–12)
AST: 15 U/L (ref 7–40)
CO2: 28 MMOL/L (ref 21–30)
EGFR: 30 mL/min — ABNORMAL LOW (ref >60)
SODIUM: 142 MMOL/L (ref 137–147)

## 2022-07-14 MED ORDER — NEOMYCIN 500 MG PO TAB
ORAL_TABLET | 0 refills | Status: AC
Start: 2022-07-14 — End: ?
  Filled 2022-07-28: qty 6, 1d supply, fill #1

## 2022-07-14 MED ORDER — SODIUM CHLORIDE 0.9 % IV SOLP
250 mL | INTRAVENOUS | 0 refills
Start: 2022-07-14 — End: ?

## 2022-07-14 MED ORDER — NALOXEGOL 12.5 MG PO TAB
25 mg | Freq: Once | ORAL | 0 refills
Start: 2022-07-14 — End: ?

## 2022-07-14 MED ORDER — ACETAMINOPHEN 500 MG PO TAB
1000 mg | Freq: Once | ORAL | 0 refills
Start: 2022-07-14 — End: ?

## 2022-07-14 MED ORDER — PRESURGERY KIT A
PACK | 0 refills | Status: AC
Start: 2022-07-14 — End: ?
  Filled 2022-07-28: qty 1, 1d supply, fill #1

## 2022-07-14 MED ORDER — CEFAZOLIN 2 GRAM IV SOLR
2 g | Freq: Once | INTRAVENOUS | 0 refills
Start: 2022-07-14 — End: ?

## 2022-07-14 MED ORDER — GABAPENTIN 300 MG PO CAP
600 mg | Freq: Once | ORAL | 0 refills
Start: 2022-07-14 — End: ?

## 2022-07-14 MED ORDER — CELECOXIB 100 MG PO CAP
200 mg | Freq: Once | ORAL | 0 refills
Start: 2022-07-14 — End: ?

## 2022-07-14 MED ORDER — METRONIDAZOLE IN NACL (ISO-OS) 500 MG/100 ML IV PGBK
500 mg | Freq: Once | INTRAVENOUS | 0 refills
Start: 2022-07-14 — End: ?

## 2022-07-14 MED ORDER — METRONIDAZOLE 500 MG PO TAB
ORAL_TABLET | 0 refills | Status: AC
Start: 2022-07-14 — End: ?
  Filled 2022-07-28: qty 6, 1d supply, fill #1

## 2022-07-14 NOTE — Progress Notes
Clinical Nutrition Assessment Summary    Raven [name] is a 77 y.o. 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 Pt seen by Dr Daphine Deutscher for surgical evaluation of a colonic stricture; dietitian referral placed for assistance with improving nutritional status.    Nutrition Assessment of Patient:  BMI Categories Adult: Obesity Class I: 30-34.9  Desired Weight: 62.2 kg  Estimated Calorie Needs: 1870-2175 kcal (30-35 kcal/kg desired wt)  Estimated Protein Needs: 81-94 g (1.3-1.5 g/kg)  Needs to promote: weight maintenance    Wt Readings from Last 5 Encounters:   07/14/22 81.6 kg (180 lb)   06/16/22 78.6 kg (173 lb 3.2 oz)   01/24/20 92.7 kg (204 lb 6.4 oz)   06/16/18 93.9 kg (207 lb)      Pt and family seen in clinic. Pt reports she has no appetite and little interest in food. Pt reports that she eats 1 meal per day and has been drinking 2-3 Boost per day; unsure of which type of Boost they have, but pt really likes it. Agreeable to trying higher kcal and protein options discussed. Pt reports that she eats mainly soft food. Pt denies wt loss. Pt reports she will need to take her lasix again since she is supposed to take them when her wt is over 170s. All questions answered.     Malnutrition Details:  Does not meet criteria    Nutrition Diagnosis:  Inadequate oral intake      Etiology: poor appetite      Signs & Symptoms: pt report          Intervention/Plan:  Educated patient on nutritional needs, protein recommendations, adequate hydration to maintain weight and strength.   Provided patient with list of foods high in protein. Reviewed dietary sources of protein, including meats, poultry, fish, eggs, milk, Greek yogurt, cottage cheese, soybeans and tofu, nuts/seeds/legumes, nut butters, oral supplements (powders and drinks). Encouraged inclusion of protein at all meals/snacks, discussed ideas including protein sources pt likes.  Recommend switching to Ensure Plus or Ensure Complete. Provided samples/ coupons and [facility] pharmacy discounted supplement program information.  Provided healthy shake recipes/recommendations for ready to drink shakes. Encouraged use of homemade smoothies, potentially using oral supplement as base with additions of ice cream, nut butter, fruit, spoonful oil for more calories.  Encouraged small, frequent meals and snacks, every 2-3 hours throughout the day and choose nutrient dense, high protein foods.   Discussed strategies to optimize intake in setting of low appetite, encouraged small frequent meals including preferred foods for appetite stimulation. Suggest using timer or cues to remind pt to attempt eating q2-3hr and maximizing intake when appetite is present.   Discussed energy-dense foods and methods of caloric enhancement, to include healthy sources of fat such as olive oil, avocado, hummus or garbanzo beans, nut butters.  Patient was provided with RD's contact information for further assistance if needed. Will be available PRN.    Nutrition Monitoring and Evaluation:  Goal: weight maintenance  Time Frame: ongoing    The patient was allowed to ask questions and actively participated in creating plan of care. I provided the patient with my contact information and instructed pt on how to get in touch with me if questions or concerns arise. Thank you for allowing nutrition services to participate in this patients care.  Follow up Date: prn        Bluford Main, MS, RD, LD  Phone: [phone]

## 2022-07-15 ENCOUNTER — Encounter: Admit: 2022-07-15 | Discharge: 2022-07-15 | Payer: MEDICARE

## 2022-07-16 ENCOUNTER — Encounter: Admit: 2022-07-16 | Discharge: 2022-07-16 | Payer: MEDICARE

## 2022-07-17 ENCOUNTER — Encounter: Admit: 2022-07-17 | Discharge: 2022-07-17 | Payer: MEDICARE

## 2022-07-21 ENCOUNTER — Encounter: Admit: 2022-07-21 | Discharge: 2022-07-21 | Payer: MEDICARE

## 2022-07-21 ENCOUNTER — Ambulatory Visit: Admit: 2022-07-21 | Discharge: 2022-07-21 | Payer: MEDICARE

## 2022-07-21 DIAGNOSIS — Z01818 Encounter for other preprocedural examination: Secondary | ICD-10-CM

## 2022-07-21 DIAGNOSIS — M549 Dorsalgia, unspecified: Secondary | ICD-10-CM

## 2022-07-21 DIAGNOSIS — J449 Chronic obstructive pulmonary disease, unspecified: Secondary | ICD-10-CM

## 2022-07-21 DIAGNOSIS — H547 Unspecified visual loss: Secondary | ICD-10-CM

## 2022-07-21 DIAGNOSIS — F419 Anxiety disorder, unspecified: Secondary | ICD-10-CM

## 2022-07-21 DIAGNOSIS — I2510 Atherosclerotic heart disease of native coronary artery without angina pectoris: Secondary | ICD-10-CM

## 2022-07-21 DIAGNOSIS — C55 Malignant neoplasm of uterus, part unspecified: Secondary | ICD-10-CM

## 2022-07-21 DIAGNOSIS — E785 Hyperlipidemia, unspecified: Secondary | ICD-10-CM

## 2022-07-21 DIAGNOSIS — R1900 Intra-abdominal and pelvic swelling, mass and lump, unspecified site: Secondary | ICD-10-CM

## 2022-07-21 DIAGNOSIS — J45909 Unspecified asthma, uncomplicated: Secondary | ICD-10-CM

## 2022-07-21 DIAGNOSIS — C801 Malignant (primary) neoplasm, unspecified: Secondary | ICD-10-CM

## 2022-07-21 DIAGNOSIS — G4730 Sleep apnea, unspecified: Secondary | ICD-10-CM

## 2022-07-21 DIAGNOSIS — I6521 Occlusion and stenosis of right carotid artery: Secondary | ICD-10-CM

## 2022-07-21 DIAGNOSIS — M1990 Unspecified osteoarthritis, unspecified site: Secondary | ICD-10-CM

## 2022-07-21 DIAGNOSIS — R609 Edema, unspecified: Secondary | ICD-10-CM

## 2022-07-21 DIAGNOSIS — I10 Essential (primary) hypertension: Secondary | ICD-10-CM

## 2022-07-21 DIAGNOSIS — E119 Type 2 diabetes mellitus without complications: Secondary | ICD-10-CM

## 2022-07-21 DIAGNOSIS — I519 Heart disease, unspecified: Secondary | ICD-10-CM

## 2022-07-21 NOTE — Pre-Anesthesia Patient Instructions
PREPROCEDURE INFORMATION    Arrival at the hospital  [facility]  [address]  [place], [place] [address]    Park in the P5 parking garage located at [address] [place], [place] [address].   If parking in the P5 garage, take the east elevators in the parking garage to the second level and walk to the entrance of the [employer].    Enter through the 1st floor main entrance and check in with Information Desk.   If you are a woman between the ages of 72 and 42, and have not had a hysterectomy, you will be asked for a urine sample prior to surgery.  Please do not urinate before arriving in the Surgery Waiting Room.  Once there, check in and let the attendant know if you need to provide a sample.    You will receive a call with your surgery arrival time between 2:30pm and 4:30pm the last business day before your procedure.  If you do not receive a call, please call [phone] before 4:30pm or [phone] after 4:30pm.    Eating or drinking before surgery  If you received a pre-surgery kit from your surgeon, follow the instructions in the kit. If you are diabetic, do not drink the carbohydrate nutritional drinks that may be included in your kit.    Planning transportation for outpatient procedure  For your safety, you will need to arrange for a responsible ride/person to accompany you home due to sedation or anesthesia with your procedure.  An [employer], taxi or other public transportation driver is not considered a responsible person to accompany you home.    Bath/Shower Instructions  Put on clean clothes after bath or shower.  Avoid using lotion and oils.  Wash using the antibacterial wipes you received in your pre-surgery kit as directed.    Morning of your procedure:  Brush your teeth and tongue  Do not smoke, vape, chew or user any tobacco products.  Do not shave the area where you will have surgery.  Remove nail polish, makeup and all jewelry (including piercings) before coming to the hospital.  Dress in clean, loose, comfortable clothing.    Valuables  Leave money, credit cards, jewelry, and any other valuables at home. If medications are being filled and picked up at a [facility] pharmacy, payment will be needed. The [facility] is not responsible for the loss or breakage of personal items.    What to bring to the hospital  ID/Insurance card  Medical Device card  Official documents for legal guardianship  Copy of your Living Will, Advanced Directives, and/or Durable Power of Attorney. If you have these documents, please bring them to the admissions office on the day of your surgery to be scanned into your records.  Do not bring medications from home unless instructed by a pharmacist.  CPAP/BiPAP machine (including all supplies)  Walker, cane, or motorized scooter  Cases for glasses/hearing aids/contact lens (bring solutions for contacts)  Stimulator remote  Insulin pump and supplies as directed by pharmacy     Notify us at [employer]: [phone] on the day of your procedure if:  You need to cancel your procedure.  You are going to be late.    Notify your surgeon if:  There is a possibility that you are pregnant.  You become ill with a cough, fever, sore throat, nausea, vomiting or flu-like symptoms.  You have any open wounds/sores that are red, painful, draining, or are  new since you last saw the doctor.  You need to cancel your procedure.    Preparing to get your medications at discharge  Your surgeon may prescribe you medications to take after your procedure.  If you like the convenience of having your medications filled here at [facility], please do the following:  Go to [facility] after your [facility] appointment to put a credit card on file.  Call [facility] pharmacy at [phone] (Monday-Friday 7am-9pm or Saturday and Sunday 9am-5pm) to put a credit card on file.  Bring a credit card or cash on the day of your procedure- please leave with a family member rather than bringing it into the preop area.    Current Visitor Policy:  Visitors must be free of fever and symptoms to be in our facilities.  No more than 2 visitors per patient are allowed.  Additional guidelines may vary, based on patient care area or patient's condition.  Patients in semiprivate rooms may have visitors, but visits should be coordinated so only two total visitors are in a room at a time due to space limitations.  Children younger than age 33 are allowed to visit inpatients.    Thank you for participating in your Preoperative Assessment visit today.    If you have any changes to your health or hospitalizations between now and your surgery, please call us at [phone].    Instructions given to patient via: verbal and printed copy

## 2022-07-21 NOTE — Progress Notes
Preoperative Medication Plan Note:    Raven [name] was seen in the [facility] on 07/21/22.  As part of the visit, an accurate medication list was obtained and the patient was given pre-op medication instructions for upcoming procedure scheduled on Plavix with Dr. Hassell Done.      Plavix : per fax from Dr. Tod Persia (cards), approved Plavix hold for 7 days prior to procedure, with the last dose on 07/30/22.    The plan above was communicated to the patient at [facility] visit and they verbalized understanding.    Colletta Maryland Alpa [place], [place]

## 2022-07-22 ENCOUNTER — Encounter: Admit: 2022-07-22 | Discharge: 2022-07-22 | Payer: MEDICARE

## 2022-07-23 ENCOUNTER — Encounter: Admit: 2022-07-23 | Discharge: 2022-07-23 | Payer: MEDICARE

## 2022-07-27 ENCOUNTER — Encounter: Admit: 2022-07-27 | Discharge: 2022-07-27 | Payer: MEDICARE

## 2022-07-28 ENCOUNTER — Encounter: Admit: 2022-07-28 | Discharge: 2022-07-28 | Payer: MEDICARE

## 2022-07-28 NOTE — Telephone Encounter
ERAS PROTOCOL NUTRITION CONSULT    RD contacted patient to assess nutritional status for upcoming surgery. Noted MST score of 1. Patient is planning for ROBOT ASSISTED LOW ANTERIOR RESECTION, POSSIBLE OPEN, POSSIBLE OSTOMY on 4/15.  RD spoke with patients son via telephone today. Patient son states that patient is doing well with nutrition, intake has improved recently. Patient is drinking Boost daily. Son states that he feels weight is stable, has not noticed any weight loss. Documented weights appear reported although do reflect a 10lb weight loss since 06/16/22. Pre-surgery kit has not yet arrived, although patient was told yesterday that it is on its way. Discussed ERAS protocol, encouraged using ERAS shakes to make milkshakes/smoothies. Encouraged continued efforts at PO intake leading up to surgery. All questions answered, RD remains available as needed.     RD reviewed the following information:  Discussed protein needs for surgery/healing. Reviewed foods high in protein and healthy shake recipes/recommendations for ready to drink shakes.   Reviewed nutritional shake component to PreSurgry Kit. Discussed Impact AR BID for 5 days before surgery, starting pre op day 6 as day prior to surgery is bowel prep. Reviewed carbohydrate drink 2-3 hours before procedure.    Richmond Campbell MS, RD, LD  Ambulatory Float Dietitian  Department of Clinical Nutrition

## 2022-07-30 ENCOUNTER — Encounter: Admit: 2022-07-30 | Discharge: 2022-07-30 | Payer: MEDICARE

## 2022-08-07 ENCOUNTER — Encounter: Admit: 2022-08-07 | Discharge: 2022-08-07 | Payer: MEDICARE

## 2022-08-10 ENCOUNTER — Encounter: Admit: 2022-08-10 | Discharge: 2022-08-10 | Payer: MEDICARE

## 2022-08-10 ENCOUNTER — Inpatient Hospital Stay: Admit: 2022-08-10 | Discharge: 2022-08-10 | Payer: MEDICARE

## 2022-08-10 DIAGNOSIS — I6521 Occlusion and stenosis of right carotid artery: Secondary | ICD-10-CM

## 2022-08-10 DIAGNOSIS — J449 Chronic obstructive pulmonary disease, unspecified: Secondary | ICD-10-CM

## 2022-08-10 DIAGNOSIS — H547 Unspecified visual loss: Secondary | ICD-10-CM

## 2022-08-10 DIAGNOSIS — C55 Malignant neoplasm of uterus, part unspecified: Secondary | ICD-10-CM

## 2022-08-10 DIAGNOSIS — M1990 Unspecified osteoarthritis, unspecified site: Secondary | ICD-10-CM

## 2022-08-10 DIAGNOSIS — I519 Heart disease, unspecified: Secondary | ICD-10-CM

## 2022-08-10 DIAGNOSIS — R1900 Intra-abdominal and pelvic swelling, mass and lump, unspecified site: Secondary | ICD-10-CM

## 2022-08-10 DIAGNOSIS — R609 Edema, unspecified: Secondary | ICD-10-CM

## 2022-08-10 DIAGNOSIS — I10 Essential (primary) hypertension: Secondary | ICD-10-CM

## 2022-08-10 DIAGNOSIS — G4730 Sleep apnea, unspecified: Secondary | ICD-10-CM

## 2022-08-10 DIAGNOSIS — J45909 Unspecified asthma, uncomplicated: Secondary | ICD-10-CM

## 2022-08-10 DIAGNOSIS — F419 Anxiety disorder, unspecified: Secondary | ICD-10-CM

## 2022-08-10 DIAGNOSIS — C801 Malignant (primary) neoplasm, unspecified: Secondary | ICD-10-CM

## 2022-08-10 DIAGNOSIS — E785 Hyperlipidemia, unspecified: Secondary | ICD-10-CM

## 2022-08-10 DIAGNOSIS — M549 Dorsalgia, unspecified: Secondary | ICD-10-CM

## 2022-08-10 DIAGNOSIS — I2510 Atherosclerotic heart disease of native coronary artery without angina pectoris: Secondary | ICD-10-CM

## 2022-08-10 DIAGNOSIS — E119 Type 2 diabetes mellitus without complications: Secondary | ICD-10-CM

## 2022-08-10 MED ORDER — DEXAMETHASONE SODIUM PHOSPHATE 10 MG/ML IJ SOLN
0 refills | Status: CP
Start: 2022-08-10 — End: ?

## 2022-08-10 MED ORDER — NOREPINEPHRINE IV DRIP STD CONC (AM)(OR)
INTRAVENOUS | 0 refills | Status: DC
Start: 2022-08-10 — End: 2022-08-10
  Administered 2022-08-10: 18:00:00 .03 ug/kg/min via INTRAVENOUS

## 2022-08-10 MED ORDER — ONDANSETRON HCL (PF) 4 MG/2 ML IJ SOLN
INTRAVENOUS | 0 refills | Status: DC
Start: 2022-08-10 — End: 2022-08-10

## 2022-08-10 MED ORDER — ROCURONIUM 10 MG/ML IV SOLN
INTRAVENOUS | 0 refills | Status: DC
Start: 2022-08-10 — End: 2022-08-10

## 2022-08-10 MED ORDER — SUGAMMADEX 100 MG/ML IV SOLN
INTRAVENOUS | 0 refills | Status: DC
Start: 2022-08-10 — End: 2022-08-10

## 2022-08-10 MED ORDER — ARTIFICIAL TEARS (PF) SINGLE DOSE DROPS GROUP
OPHTHALMIC | 0 refills | Status: DC
Start: 2022-08-10 — End: 2022-08-10

## 2022-08-10 MED ORDER — LIDOCAINE (PF) 200 MG/10 ML (2 %) IJ SYRG
INTRAVENOUS | 0 refills | Status: DC
Start: 2022-08-10 — End: 2022-08-10

## 2022-08-10 MED ORDER — CEFAZOLIN 1 GRAM IJ SOLR
INTRAVENOUS | 0 refills | Status: DC
Start: 2022-08-10 — End: 2022-08-10

## 2022-08-10 MED ORDER — FENTANYL CITRATE (PF) 50 MCG/ML IJ SOLN
INTRAVENOUS | 0 refills | Status: DC
Start: 2022-08-10 — End: 2022-08-10

## 2022-08-10 MED ORDER — FAMOTIDINE (PF) 20 MG/2 ML IV SOLN
INTRAVENOUS | 0 refills | Status: DC
Start: 2022-08-10 — End: 2022-08-10

## 2022-08-10 MED ORDER — BUPIVACAINE HCL 0.25 % (2.5 MG/ML) IJ SOLN
0 refills | Status: CP
Start: 2022-08-10 — End: ?

## 2022-08-10 MED ORDER — PROPOFOL INJ 10 MG/ML IV VIAL
INTRAVENOUS | 0 refills | Status: DC
Start: 2022-08-10 — End: 2022-08-10

## 2022-08-10 MED ADMIN — SODIUM CHLORIDE 0.9 % IV SOLP [27838]: 250.000 mL | INTRAVENOUS | @ 19:00:00 | Stop: 2022-08-11 | NDC 00338004902

## 2022-08-10 MED ADMIN — HYDROMORPHONE (PF) 2 MG/ML IJ SYRG [163476]: 0.5 mg | INTRAVENOUS | @ 23:00:00 | Stop: 2022-08-11 | NDC 00409131203

## 2022-08-10 MED ADMIN — FENTANYL CITRATE (PF) 50 MCG/ML IJ SOLN [3037]: 12.5 ug | INTRAVENOUS | Stop: 2022-08-11 | NDC 00409909412

## 2022-08-10 MED ADMIN — SODIUM CHLORIDE 0.9 % IV SOLP [27838]: 250 mL | INTRAVENOUS | @ 18:00:00 | Stop: 2022-08-11 | NDC 00338004902

## 2022-08-10 MED ADMIN — FENTANYL CITRATE (PF) 50 MCG/ML IJ SOLN [3037]: 25 ug | INTRAVENOUS | @ 23:00:00 | Stop: 2022-08-11 | NDC 00409909412

## 2022-08-10 MED ADMIN — FENTANYL CITRATE (PF) 50 MCG/ML IJ SOLN [3037]: 12.5 ug | INTRAVENOUS | @ 23:00:00 | Stop: 2022-08-11 | NDC 00409909412

## 2022-08-10 MED ADMIN — ACETAMINOPHEN 500 MG PO TAB [102]: 1000 mg | ORAL | @ 17:00:00 | Stop: 2022-08-10 | NDC 00904673061

## 2022-08-10 MED ADMIN — NALOXEGOL 12.5 MG PO TAB [324272]: 25 mg | ORAL | @ 17:00:00 | Stop: 2022-08-10 | NDC 82625880101

## 2022-08-10 MED ADMIN — GABAPENTIN 300 MG PO CAP [18308]: 600 mg | ORAL | @ 17:00:00 | Stop: 2022-08-10 | NDC 00904666661

## 2022-08-10 MED ADMIN — CELECOXIB 200 MG PO CAP [76958]: 200 mg | ORAL | @ 17:00:00 | Stop: 2022-08-10 | NDC 00904650361

## 2022-08-10 MED ADMIN — OXYCODONE 5 MG PO TAB [10814]: 5 mg | ORAL | @ 23:00:00 | Stop: 2022-08-10 | NDC 00904696661

## 2022-08-10 MED ADMIN — METRONIDAZOLE IN NACL (ISO-OS) 500 MG/100 ML IV PGBK [5018]: 500 mg | INTRAVENOUS | @ 18:00:00 | Stop: 2022-08-10 | NDC 00338105548

## 2022-08-11 ENCOUNTER — Ambulatory Visit: Admit: 2022-08-11 | Discharge: 2022-08-11 | Payer: MEDICARE

## 2022-08-11 ENCOUNTER — Encounter: Admit: 2022-08-11 | Discharge: 2022-08-11 | Payer: MEDICARE

## 2022-08-11 DIAGNOSIS — Z959 Presence of cardiac and vascular implant and graft, unspecified: Secondary | ICD-10-CM

## 2022-08-11 MED ADMIN — INSULIN ASPART 100 UNIT/ML SC FLEXPEN [87504]: 2 [IU] | SUBCUTANEOUS | Stop: 2022-08-11 | NDC 00169633910

## 2022-08-11 MED ADMIN — ACETAMINOPHEN 500 MG PO TAB [102]: 1000 mg | ORAL | @ 18:00:00 | Stop: 2022-08-14 | NDC 00904673061

## 2022-08-11 MED ADMIN — CARVEDILOL 3.125 MG PO TAB [79317]: 6.25 mg | ORAL | @ 14:00:00 | NDC 00904630061

## 2022-08-11 MED ADMIN — POTASSIUM CHLORIDE 20 MEQ PO TBTQ [35943]: 60 meq | ORAL | @ 11:00:00 | Stop: 2022-08-11 | NDC 00832532511

## 2022-08-11 MED ADMIN — SODIUM CHLORIDE 0.9 % IV SOLP [27838]: 250 mL | INTRAVENOUS | @ 02:00:00 | Stop: 2022-08-11 | NDC 00338004902

## 2022-08-11 MED ADMIN — ACETAMINOPHEN 500 MG PO TAB [102]: 1000 mg | ORAL | @ 11:00:00 | Stop: 2022-08-14 | NDC 00904673061

## 2022-08-11 MED ADMIN — ACETAMINOPHEN 500 MG PO TAB [102]: 1000 mg | ORAL | @ 02:00:00 | Stop: 2022-08-14 | NDC 00904673061

## 2022-08-11 MED ADMIN — WATER FOR INJECTION, STERILE IJ SOLN [79513]: 15 mL | INTRAVENOUS | @ 07:00:00 | Stop: 2022-08-11 | NDC 00409488723

## 2022-08-11 MED ADMIN — GABAPENTIN 100 MG PO CAP [18309]: 200 mg | ORAL | @ 02:00:00 | Stop: 2022-08-13 | NDC 00904666561

## 2022-08-11 MED ADMIN — METRONIDAZOLE IN NACL (ISO-OS) 500 MG/100 ML IV PGBK [5018]: 500 mg | INTRAVENOUS | @ 09:00:00 | Stop: 2022-08-11 | NDC 00409015201

## 2022-08-11 MED ADMIN — ROSUVASTATIN 10 MG PO TAB [88503]: 20 mg | ORAL | @ 14:00:00 | NDC 00904677961

## 2022-08-11 MED ADMIN — CEFAZOLIN 2 GRAM IV SOLR [462609]: 2 g | INTRAVENOUS | @ 18:00:00 | Stop: 2022-08-11 | NDC 44567084001

## 2022-08-11 MED ADMIN — TORSEMIDE 20 MG PO TAB [18293]: 20 mg | ORAL | @ 14:00:00 | NDC 68084053911

## 2022-08-11 MED ADMIN — ENOXAPARIN 30 MG/0.3 ML SC SYRG [85048]: 30 mg | SUBCUTANEOUS | @ 11:00:00 | NDC 63323055921

## 2022-08-11 MED ADMIN — INSULIN ASPART 100 UNIT/ML SC FLEXPEN [87504]: 2 [IU] | SUBCUTANEOUS | @ 07:00:00 | NDC 00169633910

## 2022-08-11 MED ADMIN — ISOSORBIDE MONONITRATE 30 MG PO TB24 [24521]: 30 mg | ORAL | @ 14:00:00 | NDC 00904644961

## 2022-08-11 MED ADMIN — FAMOTIDINE 20 MG PO TAB [10011]: 20 mg | ORAL | @ 14:00:00 | NDC 00904719361

## 2022-08-11 MED ADMIN — SERTRALINE 50 MG PO TAB [82509]: 50 mg | ORAL | @ 14:00:00 | NDC 00904692561

## 2022-08-11 MED ADMIN — LACTATED RINGERS IV SOLP [4318]: 1000.000 mL | INTRAVENOUS | @ 02:00:00 | Stop: 2022-08-12 | NDC 00338011704

## 2022-08-11 MED ADMIN — CEFAZOLIN 2 GRAM IV SOLR [462609]: 2 g | INTRAVENOUS | @ 07:00:00 | Stop: 2022-08-11 | NDC 44567084001

## 2022-08-11 MED ADMIN — TRAMADOL 50 MG PO TAB [14632]: 50 mg | ORAL | @ 02:00:00 | NDC 00904717961

## 2022-08-11 MED ADMIN — METRONIDAZOLE IN NACL (ISO-OS) 500 MG/100 ML IV PGBK [5018]: 500 mg | INTRAVENOUS | @ 02:00:00 | Stop: 2022-08-11 | NDC 00338105548

## 2022-08-11 MED ADMIN — CARVEDILOL 3.125 MG PO TAB [79317]: 6.25 mg | ORAL | @ 22:00:00 | NDC 00904630061

## 2022-08-11 MED ADMIN — GABAPENTIN 100 MG PO CAP [18309]: 200 mg | ORAL | @ 14:00:00 | Stop: 2022-08-13 | NDC 00904666561

## 2022-08-11 MED ADMIN — FAMOTIDINE 20 MG PO TAB [10011]: 20 mg | ORAL | @ 02:00:00 | NDC 63739064510

## 2022-08-11 MED ADMIN — NALOXEGOL 12.5 MG PO TAB [324272]: 25 mg | ORAL | @ 11:00:00 | Stop: 2022-08-11 | NDC 82625880101

## 2022-08-12 ENCOUNTER — Encounter: Admit: 2022-08-12 | Discharge: 2022-08-12 | Payer: MEDICARE

## 2022-08-12 DIAGNOSIS — E119 Type 2 diabetes mellitus without complications: Secondary | ICD-10-CM

## 2022-08-12 DIAGNOSIS — I6521 Occlusion and stenosis of right carotid artery: Secondary | ICD-10-CM

## 2022-08-12 DIAGNOSIS — E785 Hyperlipidemia, unspecified: Secondary | ICD-10-CM

## 2022-08-12 DIAGNOSIS — I519 Heart disease, unspecified: Secondary | ICD-10-CM

## 2022-08-12 DIAGNOSIS — I10 Essential (primary) hypertension: Secondary | ICD-10-CM

## 2022-08-12 DIAGNOSIS — C801 Malignant (primary) neoplasm, unspecified: Secondary | ICD-10-CM

## 2022-08-12 DIAGNOSIS — J45909 Unspecified asthma, uncomplicated: Secondary | ICD-10-CM

## 2022-08-12 DIAGNOSIS — G4730 Sleep apnea, unspecified: Secondary | ICD-10-CM

## 2022-08-12 DIAGNOSIS — M549 Dorsalgia, unspecified: Secondary | ICD-10-CM

## 2022-08-12 DIAGNOSIS — R1900 Intra-abdominal and pelvic swelling, mass and lump, unspecified site: Secondary | ICD-10-CM

## 2022-08-12 DIAGNOSIS — C55 Malignant neoplasm of uterus, part unspecified: Secondary | ICD-10-CM

## 2022-08-12 DIAGNOSIS — I2510 Atherosclerotic heart disease of native coronary artery without angina pectoris: Secondary | ICD-10-CM

## 2022-08-12 DIAGNOSIS — H547 Unspecified visual loss: Secondary | ICD-10-CM

## 2022-08-12 DIAGNOSIS — J449 Chronic obstructive pulmonary disease, unspecified: Secondary | ICD-10-CM

## 2022-08-12 DIAGNOSIS — R609 Edema, unspecified: Secondary | ICD-10-CM

## 2022-08-12 DIAGNOSIS — M1990 Unspecified osteoarthritis, unspecified site: Secondary | ICD-10-CM

## 2022-08-12 DIAGNOSIS — F419 Anxiety disorder, unspecified: Secondary | ICD-10-CM

## 2022-08-12 MED ADMIN — ISOSORBIDE MONONITRATE 30 MG PO TB24 [24521]: 30 mg | ORAL | @ 14:00:00 | NDC 00904644961

## 2022-08-12 MED ADMIN — ACETAMINOPHEN 500 MG PO TAB [102]: 1000 mg | ORAL | @ 03:00:00 | Stop: 2022-08-14 | NDC 00904673061

## 2022-08-12 MED ADMIN — ACETAMINOPHEN 500 MG PO TAB [102]: 1000 mg | ORAL | @ 11:00:00 | Stop: 2022-08-14 | NDC 00904673061

## 2022-08-12 MED ADMIN — METOLAZONE 5 MG PO TAB [10588]: 5 mg | ORAL | @ 14:00:00 | Stop: 2022-08-12 | NDC 51079002401

## 2022-08-12 MED ADMIN — CARVEDILOL 6.25 MG PO TAB [77309]: 6.25 mg | ORAL | @ 22:00:00 | NDC 00904630161

## 2022-08-12 MED ADMIN — CARVEDILOL 3.125 MG PO TAB [79317]: 6.25 mg | ORAL | @ 14:00:00 | NDC 00904630061

## 2022-08-12 MED ADMIN — ROSUVASTATIN 10 MG PO TAB [88503]: 20 mg | ORAL | @ 14:00:00 | NDC 00904677961

## 2022-08-12 MED ADMIN — GABAPENTIN 100 MG PO CAP [18309]: 200 mg | ORAL | @ 02:00:00 | Stop: 2022-08-13 | NDC 00904666561

## 2022-08-12 MED ADMIN — TORSEMIDE 20 MG PO TAB [18293]: 20 mg | ORAL | @ 14:00:00 | NDC 68084053911

## 2022-08-12 MED ADMIN — INSULIN GLARGINE 100 UNIT/ML (3 ML) SC INJ PEN [163596]: 12 [IU] | SUBCUTANEOUS | @ 03:00:00 | NDC 00088221901

## 2022-08-12 MED ADMIN — FAMOTIDINE 20 MG PO TAB [10011]: 20 mg | ORAL | @ 14:00:00 | NDC 00904719361

## 2022-08-12 MED ADMIN — ENOXAPARIN 30 MG/0.3 ML SC SYRG [85048]: 30 mg | SUBCUTANEOUS | @ 11:00:00 | NDC 00781323801

## 2022-08-12 MED ADMIN — ACETAMINOPHEN 500 MG PO TAB [102]: 1000 mg | ORAL | @ 20:00:00 | Stop: 2022-08-14 | NDC 00904673061

## 2022-08-12 MED ADMIN — POTASSIUM CHLORIDE 20 MEQ PO TBTQ [35943]: 20 meq | ORAL | @ 11:00:00 | Stop: 2022-08-12 | NDC 00832532511

## 2022-08-12 MED ADMIN — GABAPENTIN 100 MG PO CAP [18309]: 200 mg | ORAL | @ 14:00:00 | Stop: 2022-08-12 | NDC 00904666561

## 2022-08-12 MED ADMIN — SERTRALINE 50 MG PO TAB [82509]: 50 mg | ORAL | @ 14:00:00 | NDC 00904692561

## 2022-08-12 MED ADMIN — INSULIN ASPART 100 UNIT/ML SC FLEXPEN [87504]: 2 [IU] | SUBCUTANEOUS | @ 17:00:00 | NDC 00169633910

## 2022-08-13 ENCOUNTER — Encounter: Admit: 2022-08-13 | Discharge: 2022-08-13 | Payer: MEDICARE

## 2022-08-13 MED ADMIN — ACETAMINOPHEN 500 MG PO TAB [102]: 1000 mg | ORAL | @ 02:00:00 | Stop: 2022-08-14 | NDC 00904673061

## 2022-08-13 MED ADMIN — ROSUVASTATIN 20 MG PO TAB [88504]: 20 mg | ORAL | @ 13:00:00 | Stop: 2022-08-13 | NDC 68462026390

## 2022-08-13 MED ADMIN — CARVEDILOL 6.25 MG PO TAB [77309]: 6.25 mg | ORAL | @ 13:00:00 | Stop: 2022-08-13 | NDC 68084085411

## 2022-08-13 MED ADMIN — TORSEMIDE 20 MG PO TAB [18293]: 20 mg | ORAL | @ 13:00:00 | Stop: 2022-08-13 | NDC 68084053911

## 2022-08-13 MED ADMIN — ACETAMINOPHEN 500 MG PO TAB [102]: 1000 mg | ORAL | @ 20:00:00 | Stop: 2022-08-13 | NDC 00904673061

## 2022-08-13 MED ADMIN — METOLAZONE 5 MG PO TAB [10588]: 5 mg | ORAL | @ 15:00:00 | Stop: 2022-08-13 | NDC 51079002401

## 2022-08-13 MED ADMIN — ISOSORBIDE MONONITRATE 30 MG PO TB24 [24521]: 30 mg | ORAL | @ 13:00:00 | Stop: 2022-08-13 | NDC 00904644961

## 2022-08-13 MED ADMIN — HEPARIN, PORCINE (PF) 5,000 UNIT/0.5 ML IJ SYRG [95535]: 5000 [IU] | SUBCUTANEOUS | @ 11:00:00 | Stop: 2022-08-13 | NDC 00409131611

## 2022-08-13 MED ADMIN — POTASSIUM CHLORIDE 20 MEQ PO TBTQ [35943]: 20 meq | ORAL | @ 13:00:00 | Stop: 2022-08-13 | NDC 00832532510

## 2022-08-13 MED ADMIN — SERTRALINE 50 MG PO TAB [82509]: 50 mg | ORAL | @ 13:00:00 | Stop: 2022-08-13 | NDC 60687024211

## 2022-08-13 MED ADMIN — ACETAMINOPHEN 500 MG PO TAB [102]: 1000 mg | ORAL | @ 11:00:00 | Stop: 2022-08-13 | NDC 00904673061

## 2022-08-13 MED FILL — TRAMADOL 50 MG PO TAB: 50 mg | ORAL | 7 days supply | Qty: 20 | Fill #1 | Status: AC

## 2022-08-14 ENCOUNTER — Encounter: Admit: 2022-08-14 | Discharge: 2022-08-14 | Payer: MEDICARE

## 2022-08-14 NOTE — Telephone Encounter
The patient was called to follow up following their hospital discharge. Patient's son answered and states patient is asleep. I gave him our number for patient to call back if she has any concerns or questions. He states patient is feeling great and healing well.

## 2022-08-23 ENCOUNTER — Encounter: Admit: 2022-08-23 | Discharge: 2022-08-23 | Payer: MEDICARE

## 2022-08-27 ENCOUNTER — Encounter: Admit: 2022-08-27 | Discharge: 2022-08-27 | Payer: MEDICARE

## 2022-08-27 NOTE — Progress Notes
.  This prechart is intended to be a reference for patient appointments. Information is gathered from in chart as well as external records review. Information will be clarified/verified/updated in final documentation in the office visit.     Pre Clinic Pre Chart:    Raven [name] is a 77 year old female with a history of multiple myeloma, endometrial cancer status post total hysterectomy and bilateral salpingo-oophorectomy, recurrent sigmoid diverticulitis, coronary artery disease S/P coronary artery bypass graft, heart failure with reduced ejection fraction and implantable cardioverter-defibrillator placement, chronic kidney disease, and chronic obstructive pulmonary disease.  During a CT C/A/P in December '23 to followup on a chest wall mass she was found to have a segment in the sigmoid colon that could be a stricture or a circumferential mass. A colonoscopy in February was unable to fully pass the sigmoid colon due to tortuosity and edema, with severe diverticular disease noted but no mass visualized. A subsequent barium enema showed a moderate length segmentation in the sigmoid colon, raising concerns for a neoplasm.  After a period of prehabilitation Raven [name] underwent a robotic sigmoid colectomy and splenic flexure mobilization on 08/10/2022. Pathology returned as diverticulitis.    Problem List:          08/10/2022 - 1) Robotic sigmoid colectomy 2) Splenic flexure mobilization 3) ICG with assessment of colonic conduit perfusion 4) Flexible sigmoidoscopy **22 modifier for increased operative complexity - significant retroperitoneal fibrosis, multiple medical comorbidities.**  Findings:  Thickened edematous proximal and mid sigmoid colon, dense fibrosis to Gerota's fascia   Pathology:  A. Sigmoid colon, resection:    Segment of colon with diverticulosis, diverticulitis, abscess   formation, fat necrosis and reactive changes.   Fifteen reactive lymph nodes.  (0/15)

## 2022-09-01 ENCOUNTER — Encounter: Admit: 2022-09-01 | Discharge: 2022-09-01 | Payer: MEDICARE

## 2022-09-01 DIAGNOSIS — I519 Heart disease, unspecified: Secondary | ICD-10-CM

## 2022-09-01 DIAGNOSIS — R609 Edema, unspecified: Secondary | ICD-10-CM

## 2022-09-01 DIAGNOSIS — E785 Hyperlipidemia, unspecified: Secondary | ICD-10-CM

## 2022-09-01 DIAGNOSIS — E119 Type 2 diabetes mellitus without complications: Secondary | ICD-10-CM

## 2022-09-01 DIAGNOSIS — F419 Anxiety disorder, unspecified: Secondary | ICD-10-CM

## 2022-09-01 DIAGNOSIS — R1900 Intra-abdominal and pelvic swelling, mass and lump, unspecified site: Secondary | ICD-10-CM

## 2022-09-01 DIAGNOSIS — C55 Malignant neoplasm of uterus, part unspecified: Secondary | ICD-10-CM

## 2022-09-01 DIAGNOSIS — I10 Essential (primary) hypertension: Secondary | ICD-10-CM

## 2022-09-01 DIAGNOSIS — K6389 Other specified diseases of intestine: Secondary | ICD-10-CM

## 2022-09-01 DIAGNOSIS — C801 Malignant (primary) neoplasm, unspecified: Secondary | ICD-10-CM

## 2022-09-01 DIAGNOSIS — J449 Chronic obstructive pulmonary disease, unspecified: Secondary | ICD-10-CM

## 2022-09-01 DIAGNOSIS — M1990 Unspecified osteoarthritis, unspecified site: Secondary | ICD-10-CM

## 2022-09-01 DIAGNOSIS — K5792 Diverticulitis of intestine, part unspecified, without perforation or abscess without bleeding: Secondary | ICD-10-CM

## 2022-09-01 DIAGNOSIS — J45909 Unspecified asthma, uncomplicated: Secondary | ICD-10-CM

## 2022-09-01 DIAGNOSIS — I2510 Atherosclerotic heart disease of native coronary artery without angina pectoris: Secondary | ICD-10-CM

## 2022-09-01 DIAGNOSIS — G4730 Sleep apnea, unspecified: Secondary | ICD-10-CM

## 2022-09-01 DIAGNOSIS — I6521 Occlusion and stenosis of right carotid artery: Secondary | ICD-10-CM

## 2022-09-01 DIAGNOSIS — M549 Dorsalgia, unspecified: Secondary | ICD-10-CM

## 2022-09-01 DIAGNOSIS — H547 Unspecified visual loss: Secondary | ICD-10-CM

## 2022-09-01 NOTE — Progress Notes
Name: Raven [name]          MRN: [id number]      DOB: January 19, 1946      AGE: 77 y.o.   DATE OF SERVICE: 09/01/2022    Subjective:             Reason for Visit:  Post Operative Visit      Raven [name] is a 77 y.o. female.      Cancer Staging   No matching staging information was found for the patient.      History of Present Illness  Raven [name] is a 77 year old female with a history of multiple myeloma, endometrial cancer status post total hysterectomy and bilateral salpingo-oophorectomy, recurrent sigmoid diverticulitis, coronary artery disease S/P coronary artery bypass graft, heart failure with reduced ejection fraction and implantable cardioverter-defibrillator placement, chronic kidney disease, and chronic obstructive pulmonary disease.  During a CT C/A/P in December '23 to followup on a chest wall mass she was found to have a segment in the sigmoid colon that could be a stricture or a circumferential mass. A colonoscopy in February was unable to fully pass the sigmoid colon due to tortuosity and edema, with severe diverticular disease noted but no mass visualized. A subsequent barium enema showed a moderate length segmentation in the sigmoid colon, raising concerns for a neoplasm.  After a period of prehabilitation Raven [name] underwent a robotic sigmoid colectomy and splenic flexure mobilization on 08/10/2022. Pathology returned as diverticulitis.    She returns today for post operative visit. She reports a sensation of needing to have a bowel movement multiple times a day, with only a small amount of stool passed each time. They describe this as a new symptom post-surgery. The patient also mentions occasional straining during bowel movements.    The patient's post-operative recovery appears to be progressing well, with no other new symptoms reported. They have been adhering to the post-operative instructions, including the weight lifting restriction.    08/10/2022 - 1) Robotic sigmoid colectomy 2) Splenic flexure mobilization 3) ICG with assessment of colonic conduit perfusion 4) Flexible sigmoidoscopy **22 modifier for increased operative complexity - significant retroperitoneal fibrosis, multiple medical comorbidities.**  Findings:  Thickened edematous proximal and mid sigmoid colon, dense fibrosis to Gerota's fascia   Pathology:  A. Sigmoid colon, resection:    Segment of colon with diverticulosis, diverticulitis, abscess   formation, fat necrosis and reactive changes.   Fifteen reactive lymph nodes.  (0/15)     Medical History:   Diagnosis Date    Anxiety     Arthritis     Asthma     Asymptomatic stenosis of right carotid artery     Back pain     Cancer of uterus (HCC)     COPD (chronic obstructive pulmonary disease) (HCC)     Coronary artery disease     Diabetes mellitus (HCC)     Dyslipidemia     Flank mass     Fluid retention     Heart disease     Hypertension     Other malignant neoplasm without specification of site     Sleep apnea     Compliant w CPAP    Vision decreased      Surgical History:   Procedure Laterality Date    ROBOT ASSISTED LOW ANTERIOR RESECTION N/A 08/10/2022    Performed by Benetta Spar, MD at [facility]    BONE MARROW BIOPSY      CARDIAC DEFIBRILLATOR PLACEMENT  [employer]    CARDIAC SURGERY      CABG 3 V    CAROTID ENDARDECTOMY Right     COLONOSCOPY      HX CORONARY STENT PLACEMENT      HX HEART CATHETERIZATION      HX HYSTERECTOMY      TUNNELED VENOUS PORT PLACEMENT Right     Chest     Family History   Problem Relation Age of Onset    Cancer Mother     Diabetes Mother     Hypertension Mother     Cancer-Breast Sister     Cancer-Ovarian Sister     Cancer Sister     Diabetes Sister     Heart Disease Sister     Hypertension Sister     Heart Disease Brother     Hypertension Brother     Stroke Brother      Social History     Socioeconomic History    Marital status: Widowed   Tobacco Use    Smoking status: Former     Current packs/day: 0.00     Average packs/day: 1 pack/day for 20.0 years (20.0 ttl pk-yrs)     Types: Cigarettes     Start date: 65     Quit date: 2010     Years since quitting: 14.3    Smokeless tobacco: Never   Vaping Use    Vaping status: Never Used   Substance and Sexual Activity    Alcohol use: Not Currently    Drug use: Not Currently     Vaping/E-liquid Use    Vaping Use Never User      Vaping/E-liquid Substances    CBD No     Nicotine No     Flavored No     THC No                   Review of Systems   Constitutional: Negative.  Negative for chills, fever and unexpected weight change.   HENT: Negative.     Eyes: Negative.    Respiratory: Negative.     Cardiovascular: Negative.    Gastrointestinal: Negative.  Negative for abdominal distention, abdominal pain, anal bleeding, blood in stool, constipation, diarrhea, nausea, rectal pain and vomiting.   Endocrine: Negative.    Genitourinary: Negative.    Musculoskeletal: Negative.    Skin: Negative.  Negative for color change and wound.   Allergic/Immunologic: Negative.    Neurological: Negative.  Negative for weakness and light-headedness.   Hematological: Negative.    Psychiatric/Behavioral: Negative.           Objective:          acetaminophen (TYLENOL) 325 mg tablet Take three tablets by mouth every 8 hours as needed for Pain. Indications: pain    acyclovir (ZOVIRAX) 400 mg tablet Take two tablets by mouth every 7 days.    albuterol 0.083% (PROVENTIL) 2.5 mg /3 mL (0.083 %) nebulizer solution Inhale 3 mL by mouth into the lungs every 6 hours as needed.    albuterol sulfate (PROAIR HFA) 90 mcg/actuation HFA aerosol inhaler Inhale two puffs by mouth into the lungs every 6 hours as needed for Wheezing or Shortness of Breath.    budesonide-formoterol HFA (SYMBICORT) 160-4.5 mcg/actuation inhalation Inhale two puffs by mouth into the lungs twice daily as needed.    carvediloL (COREG) 6.25 mg tablet TAKE 1 TABLET BY MOUTH WITH BREAKFAST AND WITH EVENING MEAL    cholecalciferol (VITAMIN D) 1,000 units tablet  Take one tablet by mouth daily.    clopiDOGreL (PLAVIX) 75 mg tablet Take one tablet by mouth daily. Please hold until your clinic appointment  Indications: a sudden worsening of angina called acute coronary syndrome    cyanocobalamin (VITAMIN B-12) 1,000 mcg tablet Take 5,000 Units by mouth daily.    dexAMETHasone (DECADRON) 4 mg tablet TAKE 5 TABLETS(20 MG) BY MOUTH WEEKLY    dicyclomine (BENTYL) 10 mg capsule Take one capsule by mouth daily.    EMU OIL (BULK) MISC Use  as directed. Apply topically as needed    famotidine (PEPCID) 20 mg tablet Take one tablet by mouth daily.    febuxostat (ULORIC) 40 mg tablet TAKE 1/2 TABLET(20 MG) BY MOUTH DAILY    ferrous sulfate (FEOSOL) 325 mg (65 mg iron) tablet Take one tablet by mouth daily. Take on an empty stomach at least 1 hour before or 2 hours after food.    insulin aspart (U-100) (NOVOLOG FLEXPEN U-100 INSULIN) 100 unit/mL (3 mL) PEN Inject  under the skin. Per sliding scale ( she doesn't have sheet with her)    insulin detemir U-100 (LEVEMIR FLEXTOUCH) 100 unit/mL (3 mL) injection pen Inject twenty five Units under the skin at bedtime daily.    isosorbide mononitrate ER (IMDUR) 30 mg tablet Take one tablet by mouth daily.    metOLazone (ZAROXOLYN) 5 mg tablet Take one tablet by mouth DAILY PRN.    nitroglycerin (NITROSTAT) 0.4 mg tablet Place one tablet under tongue every 5 minutes as needed for Chest Pain.    ondansetron HCL (ZOFRAN) 4 mg tablet     ONETOUCH VERIO TEST STRIPS test strip USE STRIP TO CHECK GLUCOSE TWICE DAILY FOR DIABETES MELLITUS    potassium chloride SR (K-DUR) 20 mEq tablet TAKE 2 TABLETS BY MOUTH ONCE DAILY AS DIRECTED    prochlorperazine maleate (COMPAZINE) 10 mg tablet Take one tablet by mouth every 6 hours as needed.    rosuvastatin (CRESTOR) 20 mg tablet Take one tablet by mouth daily.    sertraline (ZOLOFT) 50 mg tablet Take one tablet by mouth daily.    torsemide (DEMADEX) 20 mg tablet Take one tablet by mouth daily.    traMADoL (ULTRAM) 50 mg tablet Take one tablet by mouth every 8 hours as needed for Pain. Indications: pain     Vitals:    09/01/22 1249 09/01/22 1254   BP: 109/87    BP Source: Arm, Right Upper    Pulse: 95    Temp: 36.2 ?C (97.2 ?F)    Resp: 18    SpO2: 100%    TempSrc: Temporal    PainSc:  Zero   Weight: 75.8 kg (167 lb 3.2 oz)      Body mass index is 30.58 kg/m?Marland Kitchen     Pain Score: Zero       Fatigue Scale: 4     Physical Exam  Vitals reviewed.   Constitutional:       General: She is not in acute distress.     Appearance: Normal appearance. She is well-developed.   HENT:      Head: Normocephalic and atraumatic.      Nose: Nose normal.   Eyes:      General: Lids are normal.      Conjunctiva/sclera: Conjunctivae normal.   Pulmonary:      Effort: Pulmonary effort is normal. No respiratory distress.   Abdominal:      Comments: Well healed incisions.   Musculoskeletal:  General: Normal range of motion.      Cervical back: Normal range of motion.   Neurological:      Mental Status: She is alert and oriented to person, place, and time.   Psychiatric:         Speech: Speech normal.         Behavior: Behavior normal.         Thought Content: Thought content normal.         Judgment: Judgment normal.               Assessment and Plan:  77yo female s/p robotic sigmoid colectomy and splenic flexure mobilization on 08/10/2022. Pathology returned as diverticulitis. Doing great.    Post-Operative Care:  - Reviewed that the patient has a 10lb weightlifting restriction for 6wks from the date of surgery. I warned the patient of the risk of hernia if lifting too soon. However, encouraged aerobic activity.  - Discussed that small frequent high protein meals will assist with healing. Examples of hard-boiled egg, peanut butter and crackers, or yogurt was given. Ideally should eat 6 small meals a day. If not having 6 supplements with protein supplemental shake.   - Wound care reviewed.  - Bowel habits change reviewed. Start a fiber supplement. This is common after a sigmoid resection.   - Pathology reviewed/released previously.     Diverticulosis:  - Fiber as above.   - Did discuss there would be a recommendation     Plan:  - RTC PRN.    .The patient and family were allowed to ask questions and voice concerns; these were addressed to the best of our ability. They expressed understanding of what was explained to them, and they agreed with the present plan. Patient has the phone numbers for the Cancer Center and was instructed on how to contact us with any questions or concerns.    My collaborating provider on this patient is Dr. Deedra Ehrich MD.    Brunilda Payor. Fredricka Bonine MSN, APRN, AGCNS-BC, AGPCNP-BC  Advanced Practice Provider  Colon and Rectal Surgery  Surgical Oncology  APP for Dr. Kathee Delton, Dr. Daphine Deutscher and Dr. Daiva Nakayama  Pager: [phone]  Available via Amie Critchley and [facility]

## 2022-09-15 ENCOUNTER — Encounter: Admit: 2022-09-15 | Discharge: 2022-09-15 | Payer: MEDICARE

## 2022-09-16 ENCOUNTER — Inpatient Hospital Stay: Admit: 2022-09-16 | Payer: MEDICARE

## 2022-09-16 ENCOUNTER — Encounter: Admit: 2022-09-16 | Discharge: 2022-09-16 | Payer: MEDICARE

## 2022-09-16 ENCOUNTER — Emergency Department: Admit: 2022-09-16 | Discharge: 2022-09-16 | Payer: MEDICARE

## 2022-09-16 DIAGNOSIS — K2900 Acute gastritis without bleeding: Secondary | ICD-10-CM

## 2022-09-16 DIAGNOSIS — R112 Nausea with vomiting, unspecified: Secondary | ICD-10-CM

## 2022-09-16 DIAGNOSIS — I5023 Acute on chronic systolic (congestive) heart failure: Secondary | ICD-10-CM

## 2022-09-16 DIAGNOSIS — N189 Chronic kidney disease, unspecified: Secondary | ICD-10-CM

## 2022-09-16 DIAGNOSIS — I509 Heart failure, unspecified: Secondary | ICD-10-CM

## 2022-09-16 LAB — LIPID PROFILE
CHOLESTEROL: 107 mg/dL (ref <200)
HDL: 50 mg/dL (ref >40)
LDL: 45 mg/dL (ref <100)
NON HDL CHOLESTEROL: 57 mg/dL
TRIGLYCERIDES: 72 mg/dL (ref <150)
VLDL: 14 mg/dL

## 2022-09-16 LAB — POC LACTATE: LACTIC ACID POC: 1.1 MMOL/L (ref 0.5–2.0)

## 2022-09-16 LAB — HIGH SENSITIVITY TROPONIN I 4 HR: HI SEN TNI 4 HR: 35 ng/L — ABNORMAL HIGH (ref <12)

## 2022-09-16 LAB — HIGH SENSITIVITY TROPONIN I 2 HOUR: HIGH SENSITIVITY TROPONIN I 2 HOUR: 37 ng/L — ABNORMAL HIGH (ref <12)

## 2022-09-16 LAB — COMPREHENSIVE METABOLIC PANEL
ALBUMIN: 3.7 g/dL (ref 3.5–5.0)
ALK PHOSPHATASE: 105 U/L — ABNORMAL LOW (ref 25–110)
ALT: 35 U/L (ref 7–56)
ANION GAP: 13 K/UL — ABNORMAL HIGH (ref 3–12)
AST: 43 U/L — ABNORMAL HIGH (ref 7–40)
BLD UREA NITROGEN: 64 mg/dL — ABNORMAL HIGH (ref 7–25)
CALCIUM: 8.8 mg/dL — ABNORMAL HIGH (ref 8.5–10.6)
CO2: 25 MMOL/L — ABNORMAL HIGH (ref 21–30)
EGFR: 24 mL/min — ABNORMAL LOW (ref >60)
SODIUM: 143 MMOL/L — ABNORMAL LOW (ref 137–147)
TOTAL BILIRUBIN: 0.9 mg/dL (ref 0.2–1.3)
TOTAL PROTEIN: 5.9 g/dL — ABNORMAL LOW (ref 6.0–8.0)

## 2022-09-16 LAB — POC GLUCOSE
POC GLUCOSE: 65 mg/dL — ABNORMAL LOW (ref 70–100)
POC GLUCOSE: 141 mg/dL — ABNORMAL HIGH (ref 70–100)
POC GLUCOSE: 201 mg/dL — ABNORMAL HIGH (ref 70–100)

## 2022-09-16 LAB — IRON + BINDING CAPACITY + %SAT+ FERRITIN
% SATURATION: 20 % — ABNORMAL LOW (ref 28–42)
FERRITIN: 174 ng/mL — ABNORMAL HIGH (ref 10–200)
IRON BINDING: 192 ug/dL — ABNORMAL LOW (ref 270–380)
IRON: 38 ug/dL — ABNORMAL LOW (ref 50–160)

## 2022-09-16 LAB — CBC AND DIFF
ABSOLUTE BASO COUNT: 0 K/UL (ref 0–0.20)
ABSOLUTE EOS COUNT: 0.8 K/UL — ABNORMAL HIGH (ref 0–0.45)
ABSOLUTE MONO COUNT: 1.2 K/UL — ABNORMAL HIGH (ref 0–0.80)
MDW (MONOCYTE DISTRIBUTION WIDTH): 19 (ref <20.7)
WBC COUNT: 10 K/UL (ref 4.5–11.0)

## 2022-09-16 LAB — HIGH SENSITIVITY TROPONIN I 0 HOUR: HIGH SENSITIVITY TROPONIN I 0 HOUR: 39 ng/L — ABNORMAL HIGH (ref <12)

## 2022-09-16 LAB — POC CREATININE, RAD: CREATININE, POC: 2.3 mg/dL — ABNORMAL HIGH (ref 0.4–1.00)

## 2022-09-16 LAB — MAGNESIUM: MAGNESIUM: 1.8 mg/dL — ABNORMAL HIGH (ref 1.6–2.6)

## 2022-09-16 MED ORDER — ONDANSETRON 4 MG PO TBDI
4 mg | ORAL | 0 refills | Status: AC | PRN
Start: 2022-09-16 — End: ?

## 2022-09-16 MED ORDER — PIPERACILLIN/TAZOBACTAM 4.5 G/100ML NS IVPB (MB+)
4.5 g | Freq: Two times a day (BID) | INTRAVENOUS | 0 refills | Status: AC
Start: 2022-09-16 — End: ?
  Administered 2022-09-17 – 2022-09-18 (×6): 4.5 g via INTRAVENOUS

## 2022-09-16 MED ORDER — POLYETHYLENE GLYCOL 3350 17 GRAM PO PWPK
1 | Freq: Every day | ORAL | 0 refills | Status: AC | PRN
Start: 2022-09-16 — End: ?

## 2022-09-16 MED ORDER — ONDANSETRON HCL (PF) 4 MG/2 ML IJ SOLN
4 mg | INTRAVENOUS | 0 refills | Status: AC | PRN
Start: 2022-09-16 — End: ?
  Administered 2022-09-17 – 2022-09-18 (×2): 4 mg via INTRAVENOUS

## 2022-09-16 MED ORDER — MELATONIN 5 MG PO TAB
5 mg | Freq: Every evening | ORAL | 0 refills | Status: AC | PRN
Start: 2022-09-16 — End: ?
  Administered 2022-09-19 – 2022-09-21 (×2): 5 mg via ORAL

## 2022-09-16 MED ORDER — DEXTROSE 50 % IN WATER (D50W) IV SYRG
12.5-25 g | INTRAVENOUS | 0 refills | Status: AC | PRN
Start: 2022-09-16 — End: ?

## 2022-09-16 MED ORDER — PANTOPRAZOLE 40 MG IV SOLR
40 mg | Freq: Every day | INTRAVENOUS | 0 refills | Status: DC
Start: 2022-09-16 — End: 2022-09-16

## 2022-09-16 MED ORDER — PANTOPRAZOLE 40 MG IV SOLR
40 mg | Freq: Once | INTRAVENOUS | 0 refills | Status: CP
Start: 2022-09-16 — End: ?
  Administered 2022-09-16: 19:00:00 40 mg via INTRAVENOUS

## 2022-09-16 MED ORDER — FUROSEMIDE 10 MG/ML IJ SOLN
40 mg | Freq: Once | INTRAVENOUS | 0 refills | Status: CP
Start: 2022-09-16 — End: ?
  Administered 2022-09-16: 19:00:00 40 mg via INTRAVENOUS

## 2022-09-16 MED ORDER — BUDESONIDE-FORMOTEROL 160-4.5 MCG/ACTUATION IN HFAA
2 | Freq: Two times a day (BID) | RESPIRATORY_TRACT | 0 refills | Status: AC
Start: 2022-09-16 — End: ?
  Administered 2022-09-17: 04:00:00 2 via RESPIRATORY_TRACT

## 2022-09-16 MED ORDER — SENNOSIDES-DOCUSATE SODIUM 8.6-50 MG PO TAB
1 | Freq: Every day | ORAL | 0 refills | Status: AC | PRN
Start: 2022-09-16 — End: ?

## 2022-09-16 MED ORDER — IMS MIXTURE TEMPLATE
20 mg | ORAL | 0 refills | Status: AC
Start: 2022-09-16 — End: ?
  Administered 2022-09-18 (×2): 20 mg via ORAL

## 2022-09-16 MED ORDER — INSULIN GLARGINE 100 UNIT/ML (3 ML) SC INJ PEN
15 [IU] | Freq: Every evening | SUBCUTANEOUS | 0 refills | Status: AC
Start: 2022-09-16 — End: ?
  Administered 2022-09-17: 02:00:00 15 [IU] via SUBCUTANEOUS

## 2022-09-16 MED ORDER — SERTRALINE 50 MG PO TAB
50 mg | Freq: Every day | ORAL | 0 refills | Status: AC
Start: 2022-09-16 — End: ?
  Administered 2022-09-17 – 2022-09-23 (×7): 50 mg via ORAL

## 2022-09-16 MED ORDER — FERROUS SULFATE 325 MG (65 MG IRON) PO TAB
325 mg | ORAL | 0 refills | Status: AC
Start: 2022-09-16 — End: ?
  Administered 2022-09-18 – 2022-09-23 (×3): 325 mg via ORAL

## 2022-09-16 MED ORDER — INSULIN ASPART 100 UNIT/ML SC FLEXPEN
0-6 [IU] | Freq: Before meals | SUBCUTANEOUS | 0 refills | Status: AC
Start: 2022-09-16 — End: ?
  Administered 2022-09-18: 14:00:00 2 [IU] via SUBCUTANEOUS

## 2022-09-16 MED ORDER — ROSUVASTATIN 20 MG PO TAB
20 mg | Freq: Every day | ORAL | 0 refills | Status: AC
Start: 2022-09-16 — End: ?
  Administered 2022-09-17 – 2022-09-21 (×5): 20 mg via ORAL

## 2022-09-16 MED ORDER — PANTOPRAZOLE 40 MG IV SOLR
40 mg | Freq: Every day | INTRAVENOUS | 0 refills | Status: AC
Start: 2022-09-16 — End: ?
  Administered 2022-09-17 – 2022-09-19 (×3): 40 mg via INTRAVENOUS

## 2022-09-16 MED ORDER — HEPARIN, PORCINE (PF) 5,000 UNIT/0.5 ML IJ SYRG
5000 [IU] | SUBCUTANEOUS | 0 refills | Status: AC
Start: 2022-09-16 — End: ?
  Administered 2022-09-17 – 2022-09-19 (×5): 5000 [IU] via SUBCUTANEOUS

## 2022-09-16 MED ORDER — ALBUTEROL SULFATE 2.5 MG /3 ML (0.083 %) IN NEBU
2.5 mg | RESPIRATORY_TRACT | 0 refills | Status: AC | PRN
Start: 2022-09-16 — End: ?

## 2022-09-16 MED ORDER — FUROSEMIDE 10 MG/ML IJ SOLN
40 mg | Freq: Every day | INTRAVENOUS | 0 refills | Status: AC
Start: 2022-09-16 — End: ?
  Administered 2022-09-17: 14:00:00 40 mg via INTRAVENOUS

## 2022-09-16 MED ORDER — PIPERACILLIN/TAZOBACTAM 4.5 G/NS (MB+)(EXTENDED INFUSION)
4.5 g | INTRAVENOUS | 0 refills | Status: DC
Start: 2022-09-16 — End: 2022-09-17

## 2022-09-16 MED ORDER — PIPERACILLIN/TAZOBACTAM 4.5 G/100ML NS IVPB (MB+)
4.5 g | Freq: Once | INTRAVENOUS | 0 refills | Status: CP
Start: 2022-09-16 — End: ?
  Administered 2022-09-17 (×2): 4.5 g via INTRAVENOUS

## 2022-09-16 NOTE — Progress Notes
RT Adult Assessment Note    NAME:Raven [name]             MRN: [id number]             DOB:Sep 30, 1945          AGE: 78 y.o.  ADMISSION DATE: 09/16/2022             DAYS ADMITTED: LOS: 0 days    Additional Comments:  Impressions of the patient: pt resting on bed.  Intervention(s)/outcome(s): RT eval.    Vital Signs:  Pulse: 92  RR: 18 PER MINUTE  SpO2: 98 %  O2 Device: None (Room air)  Liter Flow:    O2%:      Breath Sounds:   All Breath Sounds: Decreased  Respiratory Effort:   Respiratory Effort/Pattern: SOA (Short of air)  Comments:

## 2022-09-16 NOTE — Consults
Cardiology Consultation Report    Raven [name]  Admission Date:  09/16/2022                     Assessment/Plan:    Principal Problem:    Acute on chronic systolic heart failure [facility])      Reason for Consult: Heart failure    Assessment:    Acute on chronic HFrEF (EF 30-35%)   Ischemic cardiomyopathy  CAD s/p CABG 12/06/2008 x 3 (LIMA, RSV-EVH)  History of PCI  Severe mitral valve regurgitation  ICD in situ  Sigmoid mass status post surgical resection 08/10/2022  Multiple myeloma on chemotherapy  Carotid artery disease with prior endarterectomy  Chronic kidney disease, stage III    Recent Cardiac Testing:  EKG 09/16/2022: Sinus rhythm, heart rate 86, PACs, incomplete left bundle branch block  Echocardiogram 11/26/2021 ([facility]): Moderate reduced LV systolic function, EF 30-35%, grade 2 diastolic dysfunction, mildly reduced RV function, biatrial dilation, severe mitral valve regurgitation, moderate tricuspid valve regurgitation, estimated RVSP 43 mmHg  Echocardiogram 09/30/2021 ([facility]): Moderately dilated LV, severely reduced EF 25%, global hypokinesis, grade 3 diastolic dysfunction, normal RV size and function, severe mitral valve regurgitation, moderate TR  Stress test 12/30/2011: LVEF 33%, large size, severely intense, fixed perfusion abnormality in the mid anterior, apical anterior, apical septal, apical inferior, apical lateral and apex segments consistent with infarction in LAD territory    Home Cardiac Medications: Carvedilol 6.25 mg twice daily, clopidogrel 75 mg daily, isosorbide mononitrate 30 mg daily, metolazone 5 mg as needed, rosuvastatin 20 mg daily, torsemide 40 mg daily    Primary Cardiologist: Dr. Electa Sniff [facility]    Cardiac markers notable for NT proBNP 16,500, high-sensitivity troponin 39.  Chest x-ray showing diffuse interstitial lung edema.  CT abdomen pelvis with nodular opacities in the lung bases concerning for inflammatory or infectious etiology.    Impression and Recommendations:    I had the pleasure of seeing Raven [name] for evaluation of acute on chronic systolic heart failure.  Her EF has been moderate to severely reduced for years in the 30% range.  She has documented severe mitral valve regurgitation since at least 2021.  She has not been having necessarily anginal symptoms and unclear when last ischemic evaluation was performed.    Agree with IV diuretics and would start with furosemide 40mg  IV BID and reassess response  Okay to hold carvedilol given marginal blood pressures.  Will reassess reintroducing, potentially at a lower dose if needed, or switching to metoprolol  Hold isosorbide mononitrate.  May benefit from BiDil however unsure if blood pressure would tolerate.  Please monitor I's and O's and obtain daily standing weights.  Her dry weight has been a moving target with recent surgery and weight loss  If kidney function remains stable, may be candidate for spironolactone or SGLT2 to add to her GDMT  Would obtain updated echocardiogram  Please interrogate ICD to evaluate heart failure trends and any occult arrhythmias  May ultimately benefit from referral to the structural heart team regarding her severe mitral valve regurgitation  Please check iron panel and replace as needed    I appreciate the consult.  Please feel free to reach out with further questions.    Patient was seen and discussed with Dr. Angus Palms.  Please see attestation for any additional recommendations.    Higinio Plan, MD  Cardiovascular Disease Fellow  __________________________________________________________________________________    Chief Complaint: Shortness of breath  History of Present Illness: Raven [name] is a 77 y.o. female with CAD and prior CABG, ischemic cardiomyopathy (EF 30-35%), ICD, severe mitral valve regurgitation, multiple myeloma on maintenance therapy, and recent sigmoid mass resection who presents to the ER today for evaluation of shortness of breath as well as dry heaving and vomiting.  She reports feeling short of breath for the last couple weeks.  She gets short of breath walking around her house from her bedroom to the bathroom.  She has good days and bad days.  Sometimes she does not have any energy.  She feels better after dexamethasone which she takes weekly as part of her multiple myeloma therapy.  She has done reasonably well after recent sigmoid mass resection.  This was found to be diverticulitis and was not cancerous.  She saw her primary cardiologist at the beginning of May and sounded like things were stable.  She reports taking torsemide 20 mg a day for symptom management of her heart failure.  However yesterday she felt more short of breath and her weight was up some.  She took of metolazone in the morning.  She was having difficulty breathing last night and could not sleep very well.  She took another metolazone this morning open symptoms would improve but she remains short of breath.  She denies any chest pain or palpitations.  She is unsure when the last stress test or heart catheterization was performed.  She does not recall any discussion about interventions for her mitral valve.  She had her ICD checked last month and seems to be working appropriately.    Past Medical History:   Diagnosis Date    Anxiety     Arthritis     Asthma     Asymptomatic stenosis of right carotid artery     Back pain     Cancer of uterus (HCC)     COPD (chronic obstructive pulmonary disease) (HCC)     Coronary artery disease     Diabetes mellitus (HCC)     Dyslipidemia     Flank mass     Fluid retention     Heart disease     Hypertension     Other malignant neoplasm without specification of site     Sleep apnea     Compliant w CPAP    Vision decreased      Surgical History:   Procedure Laterality Date    ROBOT ASSISTED LOW ANTERIOR RESECTION N/A 08/10/2022    Performed by Benetta Spar, MD at [facility]    BONE MARROW BIOPSY CARDIAC DEFIBRILLATOR PLACEMENT      [employer]    CARDIAC SURGERY      CABG 3 V    CAROTID ENDARDECTOMY Right     COLONOSCOPY      HX CORONARY STENT PLACEMENT      HX HEART CATHETERIZATION      HX HYSTERECTOMY      TUNNELED VENOUS PORT PLACEMENT Right     Chest     Family History   Problem Relation Name Age of Onset    Cancer Mother      Diabetes Mother      Hypertension Mother      Cancer-Breast Sister      Cancer-Ovarian Sister      Cancer Sister      Diabetes Sister      Heart Disease Sister      Hypertension Sister      Heart Disease Brother  Hypertension Brother      Stroke Brother       Social History     Socioeconomic History    Marital status: Widowed   Tobacco Use    Smoking status: Former     Current packs/day: 0.00     Average packs/day: 1 pack/day for 20.0 years (20.0 ttl pk-yrs)     Types: Cigarettes     Start date: 36     Quit date: 2010     Years since quitting: 14.3    Smokeless tobacco: Never   Vaping Use    Vaping status: Never Used   Substance and Sexual Activity    Alcohol use: Not Currently    Drug use: Not Currently        Allergies:  Bortezomib, Allopurinol, and Sulfa (sulfonamide antibiotics)    Medications:  Scheduled Meds:Continuous Infusions:  PRN and Respiratory Meds:       Review of Systems:  Gen: Normal   HEENT: Normal   Resp: Shortness of breath  CV: Normal  Abd: Nausea  GU: Normal  Musculoskeletal: Normal  Heme: Normal   Endo: Normal  Derm: Normal  Psych: Normal   Neuro: Normal  Extremities: Normal      Physical Exam:  Vital Signs: Last Filed In 24 Hours Vital Signs: 24 Hour Range   BP: 117/80 (05/22 1426)  Temp: 37.1 ?C (98.8 ?F) (05/22 1117)  Pulse: 93 (05/22 1426)  Respirations: 19 PER MINUTE (05/22 1426)  SpO2: 97 % (05/22 1426)  O2 Device: None (Room air) (05/22 1117) BP: (104-117)/(60-83)   Temp:  [37.1 ?C (98.8 ?F)]   Pulse:  [92-93]   Respirations:  [14 PER MINUTE-19 PER MINUTE]   SpO2:  [96 %-98 %]   O2 Device: None (Room air)     No intake or output data in the 24 hours ending 09/16/22 1517     Physical Exam   GENERAL: The patient is well developed, well nourished, resting comfortably and in no distress.   HEENT: No abnormalities of the visible oro-nasopharynx, conjunctiva or sclera are noted.  NECK: There is elevated JVP approximately 10 cm.  Surgical scar overlying right neck from prior endarterectomy.  No bruits are heard.  Chest: Lung fields are diminished in the anterior fields and reduced in the bases.  CV: There is a regular rhythm with extrasystole. The heart sounds are obscured by significant low pitched systolic murmur best appreciated at the apex.  Displaced PMI  ABD: The abdomen is soft and supple with normal bowel sounds.  Healing laparoscopic incision sites.  Neuro: There are no focal motor defects. Cognitive function appears normal.  Ext: There is no peripheral edema.  Peripheral pulses are satisfactory.    SKIN: There are no rashes and no cellulitis.  Skin is warm and well perfused.    PSYCH: The patient is calm, rationale and oriented.    Lab/Radiology/Other Diagnostic Tests:  CBC w/Diff    Lab Results   Component Value Date/Time    WBC 10.8 09/16/2022 12:17 PM    RBC 3.26 (L) 09/16/2022 12:17 PM    HGB 10.1 (L) 09/16/2022 12:17 PM    HCT 30.4 (L) 09/16/2022 12:17 PM    MCV 93.1 09/16/2022 12:17 PM    MCH 31.0 09/16/2022 12:17 PM    MCHC 33.3 09/16/2022 12:17 PM    RDW 23.1 (H) 09/16/2022 12:17 PM    PLTCT 200 09/16/2022 12:17 PM    MPV 9.9 09/16/2022 12:17 PM    Lab  Results   Component Value Date/Time    NEUT 57 09/16/2022 12:17 PM    ANC 6.18 09/16/2022 12:17 PM    LYMA 23 (L) 09/16/2022 12:17 PM    ALC 2.43 09/16/2022 12:17 PM    MONA 12 09/16/2022 12:17 PM    AMC 1.28 (H) 09/16/2022 12:17 PM    EOSA 8 (H) 09/16/2022 12:17 PM    AEC 0.82 (H) 09/16/2022 12:17 PM    BASA 0 09/16/2022 12:17 PM    ABC 0.04 09/16/2022 12:17 PM       Comprehensive Metabolic Profile    Lab Results   Component Value Date/Time    NA 143 09/16/2022 12:17 PM    K 4.2 09/16/2022 12:17 PM CL 105 09/16/2022 12:17 PM    CO2 25 09/16/2022 12:17 PM    GAP 13 (H) 09/16/2022 12:17 PM    BUN 64 (H) 09/16/2022 12:17 PM    CR 2.3 (H) 09/16/2022 12:19 PM    CR 2.08 (H) 09/16/2022 12:17 PM    GLU 112 (H) 09/16/2022 12:17 PM    Lab Results   Component Value Date/Time    CA 8.8 09/16/2022 12:17 PM    ALBUMIN 3.7 09/16/2022 12:17 PM    TOTPROT 5.9 (L) 09/16/2022 12:17 PM    ALKPHOS 105 09/16/2022 12:17 PM    AST 43 (H) 09/16/2022 12:17 PM    ALT 35 09/16/2022 12:17 PM    TOTBILI 0.9 09/16/2022 12:17 PM       Thyroid Studies    No results found for: TSH, FREET4 No results found for: TNI, CKMB, MYOGLB        Donne Anon, MD

## 2022-09-16 NOTE — Consults
[employer] Surgery Consult     Patient: Raven [name], [id number]  Admission Date:  09/16/2022, LOS: 0 days  Admission Diagnosis: Acute on chronic systolic heart failure [facility]) [I50.23]  Date of Service: Sep 16, 2022  CONSULT ONCOLOGIC SURGERY PHYSICIAN  Consult performed by: Starleen Arms, MD  Consult ordered by: Hinton Rao, MD       ASSESSMENT:   Raven [name] is a 77 y.o. female with PMH of history of multiple myeloma, endometrial cancer status post total hysterectomy and bilateral salpingo-oophorectomy recurrent sigmoid diverticulitis who went to the OR 5/4 for LAR for diverticular disease.     Tachycardic but hemodynamically stable.  Normal abdominal exam.  Labs demonstrate normal hemoglobin, normal white blood cell count, normal bicarb, normal creatinine (baseline) with elevated BNP of over 16,000.  CT scan of the emergency room without IV contrast demonstrates mild stranding fat in the right upper quadrant near the gastric outlet and also around the bladder; no free air, no free fluid, no evidence of small bowel obstruction.    PLAN:  - No surgically intervenable pathology.   - If admission is required, would consider medical consult for question of HF vs COPD excerebration.     Discussed with Dr. Arva Chafe, MD  _____________________________________________________________________________    HPI: Raven [name] is a 77 y.o. female known history of multiple myeloma, endometrial cancer status post total hysterectomy and bilateral salpingo-oophorectomy recurrent sigmoid diverticulitis.  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    Patient went to the operating room with Dr. Martin/15/2024 with a question of sigmoid mass for which malignancy versus diverticular strictures or of concern.  Patient ultimately tolerated the operation well and was seen in clinic 09/01/2022 or pathology demonstrated benign disease.    Patient describes shortness of breath over the past two days such that she was having trouble lying flat. She also describes nausea and vomiting. She has been having bowel function and passing flatus over this time. She has had some mild RUQ pain that she describes as distinctly different than any pain she had post-operatively.     Past Medical History:   Diagnosis Date    Anxiety     Arthritis     Asthma     Asymptomatic stenosis of right carotid artery     Back pain     Cancer of uterus (HCC)     COPD (chronic obstructive pulmonary disease) (HCC)     Coronary artery disease     Diabetes mellitus (HCC)     Dyslipidemia     Flank mass     Fluid retention     Heart disease     Hypertension     Other malignant neoplasm without specification of site     Sleep apnea     Compliant w CPAP    Vision decreased      Surgical History:   Procedure Laterality Date    ROBOT ASSISTED LOW ANTERIOR RESECTION N/A 08/10/2022    Performed by Benetta Spar, MD at [facility] OR    BONE MARROW BIOPSY      CARDIAC DEFIBRILLATOR PLACEMENT      [employer]    CARDIAC SURGERY      CABG 3 V    CAROTID ENDARDECTOMY Right     COLONOSCOPY      HX CORONARY STENT PLACEMENT  HX HEART CATHETERIZATION      HX HYSTERECTOMY      TUNNELED VENOUS PORT PLACEMENT Right     Chest     Family History   Problem Relation Name Age of Onset    Cancer Mother      Diabetes Mother      Hypertension Mother      Cancer-Breast Sister      Cancer-Ovarian Sister      Cancer Sister      Diabetes Sister      Heart Disease Sister      Hypertension Sister      Heart Disease Brother      Hypertension Brother      Stroke Brother       Social History     Tobacco Use    Smoking status: Former     Current packs/day: 0.00     Average packs/day: 1 pack/day for 20.0 years (20.0 ttl pk-yrs)     Types: Cigarettes     Start date: 81     Quit date: 2010     Years since quitting: 14.3    Smokeless tobacco: Never   Substance Use Topics    Alcohol use: Not Currently     Your Current Medications:         Instructions    acetaminophen (TYLENOL) 325 mg tablet Take three tablets by mouth every 8 hours as needed for Pain. Indications: pain    acyclovir (ZOVIRAX) 400 mg tablet Take two tablets by mouth every 7 days.    albuterol 0.083% (PROVENTIL) 2.5 mg /3 mL (0.083 %) nebulizer solution Inhale 3 mL by mouth into the lungs every 6 hours as needed.    albuterol sulfate (PROAIR HFA) 90 mcg/actuation HFA aerosol inhaler Inhale two puffs by mouth into the lungs every 6 hours as needed for Wheezing or Shortness of Breath.    budesonide-formoterol HFA (SYMBICORT) 160-4.5 mcg/actuation inhalation Inhale two puffs by mouth into the lungs twice daily as needed.    carvediloL (COREG) 6.25 mg tablet TAKE 1 TABLET BY MOUTH WITH BREAKFAST AND WITH EVENING MEAL    cholecalciferol (VITAMIN D) 1,000 units tablet Take one tablet by mouth daily.    clopiDOGreL (PLAVIX) 75 mg tablet Take one tablet by mouth daily. Please hold until your clinic appointment  Indications: a sudden worsening of angina called acute coronary syndrome    cyanocobalamin (VITAMIN B-12) 1,000 mcg tablet Take 5,000 Units by mouth daily.    dexAMETHasone (DECADRON) 4 mg tablet TAKE 5 TABLETS(20 MG) BY MOUTH WEEKLY    dicyclomine (BENTYL) 10 mg capsule Take one capsule by mouth daily.    EMU OIL (BULK) MISC Use  as directed. Apply topically as needed    famotidine (PEPCID) 20 mg tablet Take one tablet by mouth every 7 days. On chemo days    febuxostat (ULORIC) 40 mg tablet TAKE 1/2 TABLET(20 MG) BY MOUTH DAILY    ferrous sulfate (FEOSOL) 325 mg (65 mg iron) tablet Take one tablet by mouth daily. Take on an empty stomach at least 1 hour before or 2 hours after food.    insulin aspart (U-100) (NOVOLOG FLEXPEN U-100 INSULIN) 100 unit/mL (3 mL) PEN Inject  under the skin. Per sliding scale ( she doesn't have sheet with her)    insulin detemir U-100 (LEVEMIR FLEXTOUCH) 100 unit/mL (3 mL) injection pen Inject twenty five Units under the skin at bedtime daily.    isosorbide mononitrate ER (IMDUR) 30 mg tablet Take one  tablet by mouth daily.    metOLazone (ZAROXOLYN) 5 mg tablet Take one tablet by mouth DAILY PRN.    nitroglycerin (NITROSTAT) 0.4 mg tablet Place one tablet under tongue every 5 minutes as needed for Chest Pain.    ondansetron HCL (ZOFRAN) 4 mg tablet     ONETOUCH VERIO TEST STRIPS test strip USE STRIP TO CHECK GLUCOSE TWICE DAILY FOR DIABETES MELLITUS    potassium chloride SR (K-DUR) 20 mEq tablet TAKE 2 TABLETS BY MOUTH ONCE DAILY AS DIRECTED    prochlorperazine maleate (COMPAZINE) 10 mg tablet Take one tablet by mouth every 6 hours as needed.    rosuvastatin (CRESTOR) 20 mg tablet Take one tablet by mouth daily.    sertraline (ZOLOFT) 50 mg tablet Take one tablet by mouth daily.    torsemide (DEMADEX) 20 mg tablet Take one tablet by mouth daily.    traMADoL (ULTRAM) 50 mg tablet Take one tablet by mouth every 8 hours as needed for Pain. Indications: pain            ROS  Negative other than that which is reviewed in HPI.    Vitals:  BP: (104-117)/(60-83)   Temp:  [37.1 ?C (98.8 ?F)]   Pulse:  [92-93]   Respirations:  [14 PER MINUTE-19 PER MINUTE]   SpO2:  [96 %-98 %]   O2 Device: None (Room air)    Physical Exam  Vitals reviewed.   Constitutional:       General: She is not in acute distress.     Appearance: She is not ill-appearing.   Cardiovascular:      Rate and Rhythm: Normal rate and regular rhythm.   Pulmonary:      Effort: Pulmonary effort is normal. No respiratory distress.   Abdominal:      General: Abdomen is flat. There is no distension.      Palpations: Abdomen is soft. There is no mass.      Tenderness: There is no abdominal tenderness. There is no guarding.      Comments: Incisions healing appropriately.    Skin:     General: Skin is dry.      Coloration: Skin is not jaundiced or pale.   Neurological:      General: No focal deficit present.      Mental Status: She is alert and oriented to person, place, and time.       Lab/Radiology/Other Diagnostic Tests:  Lab Results   Component Value Date/Time    NA 143 09/16/2022 12:17 PM    K 4.2 09/16/2022 12:17 PM    CL 105 09/16/2022 12:17 PM    CO2 25 09/16/2022 12:17 PM    BUN 64 (H) 09/16/2022 12:17 PM    CR 2.3 (H) 09/16/2022 12:19 PM    CR 2.08 (H) 09/16/2022 12:17 PM    MG 1.8 09/16/2022 12:17 PM        Lab Results   Component Value Date/Time    HGB 10.1 (L) 09/16/2022 12:17 PM    HCT 30.4 (L) 09/16/2022 12:17 PM    WBC 10.8 09/16/2022 12:17 PM    PLTCT 200 09/16/2022 12:17 PM     Lab Results   Component Value Date/Time    GLUPOC 265 (H) 08/13/2022 12:26 PM    GLUPOC 169 (H) 08/13/2022 08:04 AM    GLUPOC 189 (H) 08/13/2022 03:42 AM      Physical Exam      CT ABD/PELV WO CONTRAST   Final Result  1. Interval sigmoidectomy and high rectal anastomosis August 10, 2022. Mild stranding within the surrounding fat that either represents inflammation or postoperative scarring.   2. Development of minimal reactive ascites.   3. Development of mild stranding within the fat in the right upper quadrant that abuts the gastric outlet and duodenal bulb that is also in the bladder fossa region. This most likely represents inflammation related to gastritis or duodenitis. Endoscopy would better evaluate.   4. Development of numerous tiny, ill-defined nodular opacities in the lung bases that are most likely infectious or inflammatory.   5. Several cystic lesions within the pancreatic tail are unchanged since December 2023, though have progressed since September 2020. Endoscopic ultrasound is recommended to better evaluate.          Finalized by Rosilyn Mings, M.D. on 09/16/2022 1:08 PM. Dictated by Rosilyn Mings, M.D. on 09/16/2022 12:50 PM.         CHEST SINGLE VIEW   Final Result         Cardiac silhouette enlargement with diffuse interstitial lung edema. Superimposed infection or aspiration difficult to entirely exclude.          Finalized by Micki Riley, MD on 09/16/2022 12:35 PM. Dictated by Micki Riley, MD on 09/16/2022 12:32 PM.

## 2022-09-16 NOTE — H&P (View-Only)
Admission History and Physical    Name:  Raven [name]   NGEXB'M Date:  09/16/2022  Admission Date: 09/16/2022  LOS: 0 days                     Assessment/Plan:    Principal Problem:    Acute on chronic systolic heart failure (HCC)      Raven [name] is a 77 y.o. female with a history of multiple myeloma on daratumumab, endometrial cancer s/p hysterectomy, HFrEF s/p ICD, CAD s/p CABG, COPD, CKD, and diverticulosis s/p sigmoid colon mass resection 08/10/22 who was admitted for nausea/vomiting, poor PO tolerance, and acute on chronic HFrEF.    Nausea/vomiting  Oral intolerance  Fat stranding at gastric outlet/proximal duodenum  Diverticulosis s/p sigmoid colon mass resection 08/10/22  CT abd/pelv 5/22: RUQ fat abutting the gastric outlet, possibly gastritis or duodenitis.  Recent surgery with surgical oncology for colonic mass resection, found to be diverticulitis. No signs of malignancy.  PLAN  > Clear liquid diet per patient preference  > NPO at [facility] for possible intervention  > Blood cultures x 2  > Start Zosyn for possible gastritis/duodenitis  > IV pantoprazole 40mg  daily  > Consult surgical oncology due to recent surgery  > Consult GI for consideration of endoscopy to visualize CT findings    Acute on chronic HFrEF  CAD s/p CABG  ICD  Dry weight reportedly 164 lb, although has been changing due to recent surgery and weight loss  BNP 16,488  PTA carvedilol 6.25 mg twice daily, Imdur 30 mg daily, torsemide 20 mg daily, metolazone 5 mg daily as needed  ED: IV Lasix 40mg   PLAN  > Consult heart failure  > Start IV Lasix 40mg  daily  > Daily standing weights, strict I/O  > Device interrogation  > Echocardiogram ordered  > Hold PTA carvedilol and Imdur due to soft blood pressures, will slowly re-introduce tomorrow  > Hold PTA Plavix, will resume tomorrow if no plans for intervention    Multiple myeloma  Endometrial cancer s/p total hysterectomy and bilateral salpingo oophorectomy  Follows at [facility] oncology for daratumumab, next treatment due 09/24/22  PLAN  > PTA dexamethasone 20mg  every Thursday  > Holding PTA febuxostat due to non-formulary and patient has allergy to allopurinol    T2DM  PTA glargine 25 units daily, sliding scale aspart  PLAN  > Start glargine 15 units daily, LDCF  > Check HbA1c    CKD  Baseline Cr ~1.7-2.0    COPD > PTA Symbicort  OSA > PTA CPAP    FEN: DIET CLEAR LIQUID  Code Status: Full  DVT PPx: heparin  Disposition: Admit to medicine.    Seen and discussed with Dr. Lynnae January.    Roetta Sessions. Arlester Marker, MD  Internal Medicine PGY-3  ________________________________________________________________________    HPI:  Patient reports acute onset of nausea, vomiting, and poor p.o. intake a couple of days prior to presentation.  She also has associated generalized abdominal pain.  Denies diarrhea or constipation, had a small bowel movement earlier this morning.  Has not been able to tolerate very much p.o. intake and was not able to take her oral meds today.  She also reports progressive worsening of orthopnea and dyspnea on exertion over the past few days.  She denies increased lower extremity swelling.  Reports that her dry weight is around 164 pounds.  She has been compliant with her GDMT and diuretics.  Denies tobacco, alcohol, or recreational substance use.  She lives with her son, daughter-in-law.  She manages her own medications but her daughter sometimes helps out.    Review of Systems:  A 14-point review of systems is negative except for: HPI.    Past Medical History:   Diagnosis Date    Anxiety     Arthritis     Asthma     Asymptomatic stenosis of right carotid artery     Back pain     Cancer of uterus (HCC)     COPD (chronic obstructive pulmonary disease) (HCC)     Coronary artery disease     Diabetes mellitus (HCC)     Dyslipidemia     Flank mass     Fluid retention     Heart disease     Hypertension     Other malignant neoplasm without specification of site     Sleep apnea     Compliant w CPAP    Vision decreased      Surgical History:   Procedure Laterality Date    ROBOT ASSISTED LOW ANTERIOR RESECTION N/A 08/10/2022    Performed by Benetta Spar, MD at [facility] OR    BONE MARROW BIOPSY      CARDIAC DEFIBRILLATOR PLACEMENT      [employer]    CARDIAC SURGERY      CABG 3 V    CAROTID ENDARDECTOMY Right     COLONOSCOPY      HX CORONARY STENT PLACEMENT      HX HEART CATHETERIZATION      HX HYSTERECTOMY      TUNNELED VENOUS PORT PLACEMENT Right     Chest     Family History   Problem Relation Name Age of Onset    Cancer Mother      Diabetes Mother      Hypertension Mother      Cancer-Breast Sister      Cancer-Ovarian Sister      Cancer Sister      Diabetes Sister      Heart Disease Sister      Hypertension Sister      Heart Disease Brother      Hypertension Brother      Stroke Brother       Social History     Tobacco Use    Smoking status: Former     Current packs/day: 0.00     Average packs/day: 1 pack/day for 20.0 years (20.0 ttl pk-yrs)     Types: Cigarettes     Start date: 32     Quit date: 2010     Years since quitting: 14.3    Smokeless tobacco: Never   Vaping Use    Vaping status: Never Used   Substance and Sexual Activity    Alcohol use: Not Currently    Drug use: Not Currently        Medications  Scheduled Meds:Continuous Infusions:  PRN and Respiratory Meds:      Objective:                          Vital Signs: Last Filed                 Vital Signs: 24 Hour Range   BP: 117/80 (05/22 1426)  Temp: 37.1 ?C (98.8 ?F) (05/22 1117)  Pulse: 93 (05/22 1426)  Respirations: 19 PER MINUTE (05/22 1426)  SpO2: 97 % (05/22 1426)  O2 Device: None (Room air) (05/22 1117) BP: (  104-117)/(60-83)   Temp:  [37.1 ?C (98.8 ?F)]   Pulse:  [92-93]   Respirations:  [14 PER MINUTE-19 PER MINUTE]   SpO2:  [96 %-98 %]   O2 Device: None (Room air)     There were no vitals filed for this visit.    Intake/Output Summary:  (Last 24 hours)  No intake or output data in the 24 hours ending 09/16/22 1542        Physical Exam  General:  Alert, oriented x3, in no acute distress  HEENT:  NC/AT. EOMI, no scleral icterus.  Heart:  RRR. No murmurs, rubs, or gallops.  Lungs:  CTAB. No wheeze, rales, or rhonchi. Normal work of breathing. On room air.  Abdomen:  Soft, non-distended, mildly tender to palpation in bilateral lower quadrants. Normal bowel sounds.  Extremities:  No BLE edema.  Skin:  No visible rash or lesions. No jaundice. Normal turgor.   MSK:  No joint swelling or tenderness  Neuro:  Moves all extremities.  Psych:  Normal mood and affect    Lab Review  Pertinent labs reviewed    Point of Care Testing  (Last 24 hours)  Glucose: (!) 112 (09/16/22 1217)    Radiology and other Diagnostics Review:    CT ABD/PELV WO CONTRAST    Result Date: 09/16/2022  CT ABDOMEN AND PELVIS Clinical Indication: vomiting, recent surgery. Robotic sigmoid colectomy and splenic flexure mobilization August 10, 2022. Technique: Multiple contiguous axial CT images were obtained through the abdomen and pelvis without IV contrast. Post processing coronal and sagittal reconstruction images were made from the axial images. IV contrast: None. Bowel contrast:  None Comparison: Several prior exams, including the most recent CT April 16, 2022 FINDINGS: Limited evaluation without the use of IV contrast which includes the viscera and vasculature. Lower Thorax: Mild cardiomegaly. Partial visualization of a cardiac conduction device. Calcific coronary artery disease. Development of a miniscule right pleural effusion. Development of numerous sub-5 mm ill-defined nodular opacities in both lung bases. Liver and Biliary system: Normal size liver. Redemonstration of a prior cholecystectomy, though there is now mild ill-defined stranding within the gallbladder fossa fat that also abuts the gastric outlet and duodenal bulb. Spleen: Unremarkable. Adrenal Glands and Kidneys: Small bilateral adrenal gland nodules that are centrally fat attenuation and peripherally calcified are all unchanged since at least September 2020 and benign. Normal size left kidney without a renal calculus or hydronephrosis. 9 mm hemorrhagic cyst is present anteriorly within the left kidney. Right kidney is again severely atrophic without hydronephrosis and with an unchanged small cyst. Pancreas and Retroperitoneum: Unchanged mild diffuse atrophy of the pancreas. Several low-attenuation cystic lesions are present in the pancreatic tail that have not significantly changed since 2023, though have progressed in size and number since September 2020. The largest of these is a bilobed cystic lesion measuring 32 mm, previously 30 mm (series 302 image 50). Development of mild ill-defined and nodular thickening in the lower central retroperitoneum, such as adjacent to the expected course of the IMA origin that is most likely postoperative (series 302 image 87). Aorta and Major Vessels: Normal caliber abdominal aorta. Severe, diffuse calcific atherosclerosis. Bowel, Mesentery, and Peritoneal space: High rectal anastomosis with mild fat stranding about the anastomosis site. Mild distal colonic diverticulosis. Normal appendix. No dilated bowel loops. No pneumoperitoneum. Minimal ascites. Pelvis: Prior hysterectomy. Minimally distended urinary bladder. No iliac or inguinal lymphadenopathy. Abdominal Wall and Osseous Structures: Unchanged large intramuscular lipoma in the lateral left abdominal wall. Mild body wall  edema. Mild thoracolumbar spondylosis. Unchanged mild central superior endplate compression deformity of L1.     1. Interval sigmoidectomy and high rectal anastomosis August 10, 2022. Mild stranding within the surrounding fat that either represents inflammation or postoperative scarring. 2. Development of minimal reactive ascites. 3. Development of mild stranding within the fat in the right upper quadrant that abuts the gastric outlet and duodenal bulb that is also in the bladder fossa region. This most likely represents inflammation related to gastritis or duodenitis. Endoscopy would better evaluate. 4. Development of numerous tiny, ill-defined nodular opacities in the lung bases that are most likely infectious or inflammatory. 5. Several cystic lesions within the pancreatic tail are unchanged since December 2023, though have progressed since September 2020. Endoscopic ultrasound is recommended to better evaluate.  Finalized by Rosilyn Mings, M.D. on 09/16/2022 1:08 PM. Dictated by Rosilyn Mings, M.D. on 09/16/2022 12:50 PM.     CHEST SINGLE VIEW    Result Date: 09/16/2022  CHEST SINGLE VIEW INDICATION: SOB. COMPARISON STUDY: External chest CT dated 04/16/2022. FINDINGS: Right subclavian chest port catheter with tip in the mid SVC. Left chest wall transvenous single lead ICD with leads coursing to the right ventricle. Diffuse ill-defined opacities with vascular indistinctness. No large pleural effusion. Stable cardiac silhouette enlargement. Aorta is calcified, indicating atherosclerosis. Coronary artery bypass grafting.     Cardiac silhouette enlargement with diffuse interstitial lung edema. Superimposed infection or aspiration difficult to entirely exclude.  Finalized by Micki Riley, MD on 09/16/2022 12:35 PM. Dictated by Micki Riley, MD on 09/16/2022 12:32 PM.       Roetta Sessions. Arlester Marker, MD

## 2022-09-16 NOTE — Consults
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 CT obtained in the ED notable for fat stranding in the RUQ abutting the gastric outlet and duodenal bulb for which GI is consulted.    Patient reports onset of nonradiating epigastric abdominal pain along with nonbloody emesis about two days ago. Daughter also with N/V symptoms yesterday, but she attributes this to a new medication she has been taking. No fever or chills. She is having small bowel movements since surgery, passing gas. Not requiring any pain medications. She also notes a shortness of breath with lying flat which is partially what prompted her to present to the ED today.     Denies prior EGD. No NSAIDs. No alcohol or tobacco.  Last colonoscopy appears to be 2020 at OSH, reportedly with left sided diverticular disease, and two 1-7mm polyps which were treated with bipolar cautery. Flex sig at OSH in Feb was unable to traverse the sigmoid due to dense diverticular disease but no mass was noted.     CTAP 5/22: mild diffuse atrophy of the pancrease, several low attentuation cystic lesion in the pancreatic tail unchanged since 2023 but increase since 2020 (largest 32mm), development of mild ill-defined and nodular thickening of the lower central retroperitoneum likely postoperative, high rectal anastomosis with mild fat stranding about the anastomosis site (inflammatory vs post op), ill defined stranding within the gallbladder fossa fat that also abuts the gastric outlet and duodenal bulb likely related to gastritis or duodenitis      Assessment/ Plan:  Raven [name] is a 77 y.o. 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 CT obtained in the ED notable for fat stranding in the RUQ abutting the gastric outlet and duodenal bulb for which GI is consulted.    #Abnormal CT - fat stranding at the gastric outlet / proximal duodenum  #Epigastric pain  #Nausea/vomiting  #Complicated sigmoid diverticulitis sp LAR 08/10/22  #Antiplatelet use (Plavix, last dose 5/21)  #Pancreatic cyst    Recommendations:  -- NPO at midnight  -- Tentative plan for EGD tomorrow for further evaluation of CT findings, nausea/vomiting, epigastric pain  -- Recommend outpatient EUS for evaluation of pancreatic cysts      Patient seen/discussed with Dr. Janene Madeira.    Loanne Drilling, MD  Gastroenterology and Hepatology    -----------------------------  PMH:  Past Medical History:   Diagnosis Date    Anxiety     Arthritis     Asthma     Asymptomatic stenosis of right carotid artery     Back pain     Cancer of uterus [facility])     COPD (chronic obstructive pulmonary disease) (HCC)     Coronary artery disease     Diabetes mellitus (HCC)     Dyslipidemia  Flank mass     Fluid retention     Heart disease     Hypertension     Other malignant neoplasm without specification of site     Sleep apnea     Compliant w CPAP    Vision decreased        Current medications:  No current facility-administered medications on file prior to encounter.     Current Outpatient Medications on File Prior to Encounter   Medication Sig Dispense Refill    acetaminophen (TYLENOL) 325 mg tablet Take three tablets by mouth every 8 hours as needed for Pain. Indications: pain 40 tablet 0    acyclovir (ZOVIRAX) 400 mg tablet Take two tablets by mouth every 7 days.      albuterol 0.083% (PROVENTIL) 2.5 mg /3 mL (0.083 %) nebulizer solution Inhale 3 mL by mouth into the lungs every 6 hours as needed.      albuterol sulfate (PROAIR HFA) 90 mcg/actuation HFA aerosol inhaler Inhale two puffs by mouth into the lungs every 6 hours as needed for Wheezing or Shortness of Breath.      budesonide-formoterol HFA (SYMBICORT) 160-4.5 mcg/actuation inhalation Inhale two puffs by mouth into the lungs twice daily as needed.      carvediloL (COREG) 6.25 mg tablet TAKE 1 TABLET BY MOUTH WITH BREAKFAST AND WITH EVENING MEAL      cholecalciferol (VITAMIN D) 1,000 units tablet Take one tablet by mouth daily.      clopiDOGreL (PLAVIX) 75 mg tablet Take one tablet by mouth daily. Please hold until your clinic appointment  Indications: a sudden worsening of angina called acute coronary syndrome      cyanocobalamin (VITAMIN B-12) 1,000 mcg tablet Take 5,000 Units by mouth daily.      dexAMETHasone (DECADRON) 4 mg tablet TAKE 5 TABLETS(20 MG) BY MOUTH WEEKLY      dicyclomine (BENTYL) 10 mg capsule Take one capsule by mouth daily.      EMU OIL (BULK) MISC Use  as directed. Apply topically as needed      famotidine (PEPCID) 20 mg tablet Take one tablet by mouth every 7 days. On chemo days      febuxostat (ULORIC) 40 mg tablet TAKE 1/2 TABLET(20 MG) BY MOUTH DAILY      ferrous sulfate (FEOSOL) 325 mg (65 mg iron) tablet Take one tablet by mouth daily. Take on an empty stomach at least 1 hour before or 2 hours after food.      insulin aspart (U-100) (NOVOLOG FLEXPEN U-100 INSULIN) 100 unit/mL (3 mL) PEN Inject  under the skin. Per sliding scale ( she doesn't have sheet with her)      insulin detemir U-100 (LEVEMIR FLEXTOUCH) 100 unit/mL (3 mL) injection pen Inject twenty five Units under the skin at bedtime daily.      isosorbide mononitrate ER (IMDUR) 30 mg tablet Take one tablet by mouth daily.      metOLazone (ZAROXOLYN) 5 mg tablet Take one tablet by mouth DAILY PRN.      nitroglycerin (NITROSTAT) 0.4 mg tablet Place one tablet under tongue every 5 minutes as needed for Chest Pain.      ondansetron HCL (ZOFRAN) 4 mg tablet       ONETOUCH VERIO TEST STRIPS test strip USE STRIP TO CHECK GLUCOSE TWICE DAILY FOR DIABETES MELLITUS      potassium chloride SR (K-DUR) 20 mEq tablet TAKE 2 TABLETS BY MOUTH ONCE DAILY AS DIRECTED      prochlorperazine maleate (COMPAZINE)  10 mg tablet Take one tablet by mouth every 6 hours as needed.      rosuvastatin (CRESTOR) 20 mg tablet Take one tablet by mouth daily.      sertraline (ZOLOFT) 50 mg tablet Take one tablet by mouth daily.      torsemide (DEMADEX) 20 mg tablet Take one tablet by mouth daily.      traMADoL (ULTRAM) 50 mg tablet Take one tablet by mouth every 8 hours as needed for Pain. Indications: pain 20 tablet 0       PSH:  Surgical History:   Procedure Laterality Date    ROBOT ASSISTED LOW ANTERIOR RESECTION N/A 08/10/2022    Performed by Benetta Spar, MD at [facility] OR    BONE MARROW BIOPSY      CARDIAC DEFIBRILLATOR PLACEMENT      [employer]    CARDIAC SURGERY      CABG 3 V    CAROTID ENDARDECTOMY Right     COLONOSCOPY      HX CORONARY STENT PLACEMENT      HX HEART CATHETERIZATION      HX HYSTERECTOMY      TUNNELED VENOUS PORT PLACEMENT Right     Chest       SH:  Social History     Socioeconomic History    Marital status: Widowed   Tobacco Use    Smoking status: Former     Current packs/day: 0.00     Average packs/day: 1 pack/day for 20.0 years (20.0 ttl pk-yrs)     Types: Cigarettes     Start date: 27     Quit date: 2010     Years since quitting: 14.3    Smokeless tobacco: Never   Vaping Use    Vaping status: Never Used   Substance and Sexual Activity    Alcohol use: Not Currently    Drug use: Not Currently       FH:  Family History   Problem Relation Name Age of Onset    Cancer Mother      Diabetes Mother      Hypertension Mother      Cancer-Breast Sister      Cancer-Ovarian Sister      Cancer Sister Diabetes Sister      Heart Disease Sister      Hypertension Sister      Heart Disease Brother      Hypertension Brother      Stroke Brother         Review of Systems:  10 point ROS negative except as above.  Please see HPI for additional pertinent documentation    Physical Exam:  Vitals:    09/16/22 1117 09/16/22 1300 09/16/22 1426   BP: 104/60 108/83 117/80   BP Source: Arm, Right Upper     Pulse: 93 92 93   Temp: 37.1 ?C (98.8 ?F)     SpO2: 98% 96% 97%   O2 Device: None (Room air)       Constitutional- Vitals above, no acute distress.   Head - Normocephalic, atraumatic.   Eyes -  EOMI grossly. No icterus or injection.   Ears, nose, mouth, throat- No oral ulcer or bleeding.   Neck - No swelling or tracheal deviation.   Respiratory - Symmetric chest rise, no increased work of breathing.  Cardiovascular - Peripheral pulses intact, no pedal edema.  Gastrointestinal - Soft, nondistended, mild epigastric tenderness, healing laparoscopic sites  Skin - No exposed rash, lesion.  Neurologic -  No CN deficit, normal fluid speech  Psychiatric - Judgement intact, thought content appropriate.    Labs/Imaging:  Pertient labs/imaging were reviewed on initiation of progress note.

## 2022-09-16 NOTE — Telephone Encounter
Raven [name]'s daughter in [name] had this RN paged and reports that Raven [name] has been constipated and vomiting and they knew they should call should symptoms begin again. I called Raven [name] back and she confirmed that Raven [name] has been unable to keep any food down for several days.     They were already going to be headed to the ED, but asked if they should come to [place] versus another location. I advised if they prefer to have Dr. Daphine Deutscher available to be apart of care needs, we would encourage the [place] ED, but it is their choice of local / closest ED vs [place]. She v/u.

## 2022-09-17 ENCOUNTER — Inpatient Hospital Stay: Admit: 2022-09-17 | Discharge: 2022-09-17 | Payer: MEDICARE

## 2022-09-17 ENCOUNTER — Encounter: Admit: 2022-09-17 | Discharge: 2022-09-17 | Payer: MEDICARE

## 2022-09-17 ENCOUNTER — Inpatient Hospital Stay: Admit: 2022-09-17 | Discharge: 2022-09-16 | Payer: MEDICARE

## 2022-09-17 DIAGNOSIS — I2510 Atherosclerotic heart disease of native coronary artery without angina pectoris: Secondary | ICD-10-CM

## 2022-09-17 DIAGNOSIS — I10 Essential (primary) hypertension: Secondary | ICD-10-CM

## 2022-09-17 DIAGNOSIS — R609 Edema, unspecified: Secondary | ICD-10-CM

## 2022-09-17 DIAGNOSIS — C55 Malignant neoplasm of uterus, part unspecified: Secondary | ICD-10-CM

## 2022-09-17 DIAGNOSIS — R1900 Intra-abdominal and pelvic swelling, mass and lump, unspecified site: Secondary | ICD-10-CM

## 2022-09-17 DIAGNOSIS — J45909 Unspecified asthma, uncomplicated: Secondary | ICD-10-CM

## 2022-09-17 DIAGNOSIS — M549 Dorsalgia, unspecified: Secondary | ICD-10-CM

## 2022-09-17 DIAGNOSIS — E119 Type 2 diabetes mellitus without complications: Secondary | ICD-10-CM

## 2022-09-17 DIAGNOSIS — C801 Malignant (primary) neoplasm, unspecified: Secondary | ICD-10-CM

## 2022-09-17 DIAGNOSIS — E785 Hyperlipidemia, unspecified: Secondary | ICD-10-CM

## 2022-09-17 DIAGNOSIS — M1990 Unspecified osteoarthritis, unspecified site: Secondary | ICD-10-CM

## 2022-09-17 DIAGNOSIS — I519 Heart disease, unspecified: Secondary | ICD-10-CM

## 2022-09-17 DIAGNOSIS — J449 Chronic obstructive pulmonary disease, unspecified: Secondary | ICD-10-CM

## 2022-09-17 DIAGNOSIS — G4730 Sleep apnea, unspecified: Secondary | ICD-10-CM

## 2022-09-17 DIAGNOSIS — H547 Unspecified visual loss: Secondary | ICD-10-CM

## 2022-09-17 DIAGNOSIS — F419 Anxiety disorder, unspecified: Secondary | ICD-10-CM

## 2022-09-17 DIAGNOSIS — I6521 Occlusion and stenosis of right carotid artery: Secondary | ICD-10-CM

## 2022-09-17 MED ORDER — LACTATED RINGERS IV SOLP
INTRAVENOUS | 0 refills | Status: DC
Start: 2022-09-17 — End: 2022-09-17

## 2022-09-17 MED ORDER — PROPOFOL 10 MG/ML IV EMUL 50 ML (INFUSION)(AM)(OR)
INTRAVENOUS | 0 refills | Status: DC
Start: 2022-09-17 — End: 2022-09-17

## 2022-09-17 MED ORDER — EPINEPHRINE 0.1 MG/ML IJ SYRG
INTRAVENOUS | 0 refills | Status: DC
Start: 2022-09-17 — End: 2022-09-17

## 2022-09-17 MED ORDER — PHENYLEPHRINE HCL IN 0.9% NACL 1 MG/10 ML (100 MCG/ML) IV SYRG
INTRAVENOUS | 0 refills | Status: DC
Start: 2022-09-17 — End: 2022-09-17

## 2022-09-17 MED ORDER — LIDOCAINE (PF) 20 MG/ML (2 %) IJ SOLN
INTRAVENOUS | 0 refills | Status: DC
Start: 2022-09-17 — End: 2022-09-17

## 2022-09-17 MED ORDER — PROPOFOL INJ 10 MG/ML IV VIAL
INTRAVENOUS | 0 refills | Status: DC
Start: 2022-09-17 — End: 2022-09-17

## 2022-09-17 MED ADMIN — POTASSIUM CHLORIDE IN WATER 10 MEQ/50 ML IV PGBK [11075]: 10 meq | INTRAVENOUS | @ 15:00:00 | Stop: 2022-09-17 | NDC 00338070541

## 2022-09-17 MED ADMIN — POTASSIUM CHLORIDE IN WATER 10 MEQ/50 ML IV PGBK [11075]: 10 meq | INTRAVENOUS | @ 18:00:00 | Stop: 2022-09-17 | NDC 00338070541

## 2022-09-17 MED ADMIN — POTASSIUM CHLORIDE IN WATER 10 MEQ/50 ML IV PGBK [11075]: 10 meq | INTRAVENOUS | @ 17:00:00 | Stop: 2022-09-17 | NDC 00338070541

## 2022-09-17 MED ADMIN — SODIUM CHLORIDE 0.9 % IV SOLP [27838]: 250 mL | INTRAVENOUS | @ 01:00:00 | Stop: 2022-09-17 | NDC 00338004902

## 2022-09-17 NOTE — Progress Notes
Vascular Access Team consulted to obtain lab specimen.    Ultrasound Used: Yes    How Many Attempts: 1    Location of Unsuccessful Attempts: n/a    At 1930 approximately 20 ml of blood obtained from RAC. Patient tolerated the procedure well. Specimen labeled and sent to lab    At 1945 approximately 20 ml of blood obtained from RAC. Patient tolerated the procedure well. Specimen labeled and sent to lab

## 2022-09-18 ENCOUNTER — Inpatient Hospital Stay: Admit: 2022-09-18 | Discharge: 2022-09-18 | Payer: MEDICARE

## 2022-09-18 ENCOUNTER — Encounter: Admit: 2022-09-18 | Discharge: 2022-09-18 | Payer: MEDICARE

## 2022-09-18 DIAGNOSIS — I519 Heart disease, unspecified: Secondary | ICD-10-CM

## 2022-09-18 DIAGNOSIS — M1990 Unspecified osteoarthritis, unspecified site: Secondary | ICD-10-CM

## 2022-09-18 DIAGNOSIS — C55 Malignant neoplasm of uterus, part unspecified: Secondary | ICD-10-CM

## 2022-09-18 DIAGNOSIS — J45909 Unspecified asthma, uncomplicated: Secondary | ICD-10-CM

## 2022-09-18 DIAGNOSIS — C801 Malignant (primary) neoplasm, unspecified: Secondary | ICD-10-CM

## 2022-09-18 DIAGNOSIS — R1900 Intra-abdominal and pelvic swelling, mass and lump, unspecified site: Secondary | ICD-10-CM

## 2022-09-18 DIAGNOSIS — I10 Essential (primary) hypertension: Secondary | ICD-10-CM

## 2022-09-18 DIAGNOSIS — J449 Chronic obstructive pulmonary disease, unspecified: Secondary | ICD-10-CM

## 2022-09-18 DIAGNOSIS — M549 Dorsalgia, unspecified: Secondary | ICD-10-CM

## 2022-09-18 DIAGNOSIS — F419 Anxiety disorder, unspecified: Secondary | ICD-10-CM

## 2022-09-18 DIAGNOSIS — I6521 Occlusion and stenosis of right carotid artery: Secondary | ICD-10-CM

## 2022-09-18 DIAGNOSIS — H547 Unspecified visual loss: Secondary | ICD-10-CM

## 2022-09-18 DIAGNOSIS — G4730 Sleep apnea, unspecified: Secondary | ICD-10-CM

## 2022-09-18 DIAGNOSIS — E785 Hyperlipidemia, unspecified: Secondary | ICD-10-CM

## 2022-09-18 DIAGNOSIS — I2510 Atherosclerotic heart disease of native coronary artery without angina pectoris: Secondary | ICD-10-CM

## 2022-09-18 DIAGNOSIS — R609 Edema, unspecified: Secondary | ICD-10-CM

## 2022-09-18 DIAGNOSIS — E119 Type 2 diabetes mellitus without complications: Secondary | ICD-10-CM

## 2022-09-18 MED ADMIN — POTASSIUM CHLORIDE 20 MEQ PO TBTQ [35943]: 30 meq | ORAL | @ 14:00:00 | Stop: 2022-09-18 | NDC 00832532510

## 2022-09-18 MED ADMIN — POTASSIUM CHLORIDE 10 MEQ PO TBTQ [35942]: 30 meq | ORAL | @ 14:00:00 | Stop: 2022-09-18 | NDC 00245531789

## 2022-09-18 MED ADMIN — SODIUM CHLORIDE 0.9 % IV SOLP [27838]: 250 mL | INTRAVENOUS | @ 14:00:00 | Stop: 2022-09-18 | NDC 00338004902

## 2022-09-19 ENCOUNTER — Inpatient Hospital Stay: Admit: 2022-09-19 | Discharge: 2022-09-19 | Payer: MEDICARE

## 2022-09-19 ENCOUNTER — Encounter: Admit: 2022-09-19 | Discharge: 2022-09-19 | Payer: MEDICARE

## 2022-09-19 DIAGNOSIS — R609 Edema, unspecified: Secondary | ICD-10-CM

## 2022-09-19 DIAGNOSIS — I6521 Occlusion and stenosis of right carotid artery: Secondary | ICD-10-CM

## 2022-09-19 DIAGNOSIS — J45909 Unspecified asthma, uncomplicated: Secondary | ICD-10-CM

## 2022-09-19 DIAGNOSIS — J449 Chronic obstructive pulmonary disease, unspecified: Secondary | ICD-10-CM

## 2022-09-19 DIAGNOSIS — G4730 Sleep apnea, unspecified: Secondary | ICD-10-CM

## 2022-09-19 DIAGNOSIS — M549 Dorsalgia, unspecified: Secondary | ICD-10-CM

## 2022-09-19 DIAGNOSIS — I2510 Atherosclerotic heart disease of native coronary artery without angina pectoris: Secondary | ICD-10-CM

## 2022-09-19 DIAGNOSIS — I519 Heart disease, unspecified: Secondary | ICD-10-CM

## 2022-09-19 DIAGNOSIS — I10 Essential (primary) hypertension: Secondary | ICD-10-CM

## 2022-09-19 DIAGNOSIS — R1900 Intra-abdominal and pelvic swelling, mass and lump, unspecified site: Secondary | ICD-10-CM

## 2022-09-19 DIAGNOSIS — C55 Malignant neoplasm of uterus, part unspecified: Secondary | ICD-10-CM

## 2022-09-19 DIAGNOSIS — C801 Malignant (primary) neoplasm, unspecified: Secondary | ICD-10-CM

## 2022-09-19 DIAGNOSIS — E119 Type 2 diabetes mellitus without complications: Secondary | ICD-10-CM

## 2022-09-19 DIAGNOSIS — E785 Hyperlipidemia, unspecified: Secondary | ICD-10-CM

## 2022-09-19 DIAGNOSIS — M1990 Unspecified osteoarthritis, unspecified site: Secondary | ICD-10-CM

## 2022-09-19 DIAGNOSIS — F419 Anxiety disorder, unspecified: Secondary | ICD-10-CM

## 2022-09-19 DIAGNOSIS — H547 Unspecified visual loss: Secondary | ICD-10-CM

## 2022-09-19 MED ADMIN — POTASSIUM CHLORIDE IN WATER 10 MEQ/50 ML IV PGBK [11075]: 10 meq | INTRAVENOUS | @ 14:00:00 | Stop: 2022-09-19 | NDC 00338070541

## 2022-09-19 MED ADMIN — SODIUM CHLORIDE 0.9 % IV SOLP [27838]: 1175 [IU]/h | INTRAVENOUS | @ 18:00:00 | NDC 00338004902

## 2022-09-19 MED ADMIN — AMOXICILLIN-POT CLAVULANATE 500-125 MG PO TAB [33227]: 500 mg | ORAL | @ 14:00:00 | Stop: 2022-09-22 | NDC 00093227434

## 2022-09-19 MED ADMIN — CLOPIDOGREL 75 MG PO TAB [78966]: 75 mg | ORAL | @ 14:00:00 | NDC 00904629461

## 2022-09-19 MED ADMIN — POTASSIUM CHLORIDE 20 MEQ PO TBTQ [35943]: 60 meq | ORAL | @ 11:00:00 | Stop: 2022-09-19 | NDC 00832532510

## 2022-09-19 MED ADMIN — AMOXICILLIN-POT CLAVULANATE 500-125 MG PO TAB [33227]: 500 mg | ORAL | @ 03:00:00 | Stop: 2022-09-22 | NDC 00093227434

## 2022-09-19 MED ADMIN — POTASSIUM CHLORIDE 20 MEQ PO TBTQ [35943]: 40 meq | ORAL | @ 04:00:00 | Stop: 2022-09-19 | NDC 00832532510

## 2022-09-19 MED ADMIN — BUMETANIDE 0.25 MG/ML IJ SOLN [9308]: 2 mg | INTRAVENOUS | @ 04:00:00 | Stop: 2022-09-19 | NDC 72205010101

## 2022-09-19 MED ADMIN — AMOXICILLIN-POT CLAVULANATE 500-125 MG PO TAB [33227]: 500 mg | ORAL | @ 22:00:00 | Stop: 2022-09-22 | NDC 00093227434

## 2022-09-19 MED ADMIN — ISOSORBIDE MONONITRATE 30 MG PO TB24 [24521]: 15 mg | ORAL | @ 18:00:00 | NDC 00904644961

## 2022-09-19 MED ADMIN — BUMETANIDE 0.25 MG/ML IJ SOLN [9308]: 4 mg | INTRAVENOUS | @ 07:00:00 | Stop: 2022-09-19 | NDC 72205010101

## 2022-09-19 MED ADMIN — BUMETANIDE 0.25 MG/ML IJ SOLN [9308]: 1 mg/h | INTRAVENOUS | @ 07:00:00 | Stop: 2022-09-19 | NDC 70860040641

## 2022-09-19 MED ADMIN — METHOCARBAMOL 750 MG PO TAB [4972]: 750 mg | ORAL | @ 14:00:00 | NDC 70010077005

## 2022-09-19 MED ADMIN — POTASSIUM CHLORIDE IN WATER 10 MEQ/50 ML IV PGBK [11075]: 10 meq | INTRAVENOUS | @ 11:00:00 | Stop: 2022-09-19 | NDC 00338070541

## 2022-09-19 MED ADMIN — ACETAMINOPHEN 325 MG PO TAB [101]: 650 mg | ORAL | @ 09:00:00 | NDC 00904677361

## 2022-09-19 MED ADMIN — MAGNESIUM SULFATE IN D5W 1 GRAM/100 ML IV PGBK [166578]: 1 g | INTRAVENOUS | @ 05:00:00 | Stop: 2022-09-19 | NDC 00264440054

## 2022-09-19 MED ADMIN — ACETAMINOPHEN 325 MG PO TAB [101]: 650 mg | ORAL | @ 14:00:00 | NDC 00904677361

## 2022-09-19 MED ADMIN — CHLOROTHIAZIDE SODIUM 500 MG IV SOLR [81318]: 500 mg | INTRAVENOUS | @ 07:00:00 | Stop: 2022-09-19 | NDC 25021030520

## 2022-09-19 MED ADMIN — MILRINONE IN 5 % DEXTROSE 20 MG/100 ML (200 MCG/ML) IV PGBK [169596]: 0.25 ug/kg/min | INTRAVENOUS | @ 22:00:00 | NDC 55150028701

## 2022-09-19 MED ADMIN — ACETAMINOPHEN 325 MG PO TAB [101]: 650 mg | ORAL | @ 03:00:00 | NDC 00904677361

## 2022-09-19 MED ADMIN — LIDOCAINE 5 % TP PTMD [80759]: 2 | TOPICAL | @ 12:00:00 | NDC 00591352511

## 2022-09-19 MED ADMIN — HEPARIN (PORCINE) 10,000 UNIT/ML IJ SOLN [10177]: 1175 [IU]/h | INTRAVENOUS | @ 18:00:00 | NDC 63323054209

## 2022-09-19 MED ADMIN — MAGNESIUM SULFATE IN D5W 1 GRAM/100 ML IV PGBK [166578]: 1 g | INTRAVENOUS | @ 04:00:00 | Stop: 2022-09-19 | NDC 00264440054

## 2022-09-19 MED ADMIN — BUMETANIDE 0.25 MG/ML IJ SOLN [9308]: 1 mg/h | INTRAVENOUS | @ 15:00:00 | Stop: 2022-09-19 | NDC 70860040641

## 2022-09-19 MED ADMIN — MELATONIN 3 MG PO TAB [16830]: 3 mg | ORAL | @ 06:00:00 | Stop: 2022-09-19 | NDC 20555003600

## 2022-09-19 MED ADMIN — MILRINONE IN 5 % DEXTROSE 20 MG/100 ML (200 MCG/ML) IV PGBK [169596]: 0.125 ug/kg/min | INTRAVENOUS | @ 03:00:00 | NDC 55150028701

## 2022-09-20 ENCOUNTER — Inpatient Hospital Stay: Admit: 2022-09-20 | Discharge: 2022-09-20 | Payer: MEDICARE

## 2022-09-20 MED ADMIN — CLOPIDOGREL 75 MG PO TAB [78966]: 75 mg | ORAL | @ 14:00:00 | NDC 00904629461

## 2022-09-20 MED ADMIN — AMOXICILLIN-POT CLAVULANATE 500-125 MG PO TAB [33227]: 500 mg | ORAL | @ 22:00:00 | Stop: 2022-09-22 | NDC 00093227434

## 2022-09-20 MED ADMIN — MILRINONE IN 5 % DEXTROSE 20 MG/100 ML (200 MCG/ML) IV PGBK [169596]: 0.25 ug/kg/min | INTRAVENOUS | @ 15:00:00 | NDC 55150028701

## 2022-09-20 MED ADMIN — PANTOPRAZOLE 40 MG PO TBEC [80436]: 40 mg | ORAL | @ 15:00:00 | NDC 00904647461

## 2022-09-20 MED ADMIN — POTASSIUM CHLORIDE 20 MEQ PO TBTQ [35943]: 60 meq | ORAL | @ 10:00:00 | Stop: 2022-09-20 | NDC 00832532510

## 2022-09-20 MED ADMIN — MAGNESIUM SULFATE IN D5W 1 GRAM/100 ML IV PGBK [166578]: 1 g | INTRAVENOUS | @ 06:00:00 | Stop: 2022-09-20 | NDC 00264440054

## 2022-09-20 MED ADMIN — ISOSORBIDE MONONITRATE 30 MG PO TB24 [24521]: 15 mg | ORAL | @ 14:00:00 | Stop: 2022-09-20 | NDC 00904644961

## 2022-09-20 MED ADMIN — AMOXICILLIN-POT CLAVULANATE 500-125 MG PO TAB [33227]: 500 mg | ORAL | @ 14:00:00 | Stop: 2022-09-22 | NDC 00093227434

## 2022-09-20 MED ADMIN — DICLOFENAC SODIUM 1 % TP GEL [168032]: 2 g | TOPICAL | @ 02:00:00 | NDC 69097052444

## 2022-09-20 MED ADMIN — POTASSIUM CHLORIDE 20 MEQ PO TBTQ [35943]: 40 meq | ORAL | @ 03:00:00 | Stop: 2022-09-20 | NDC 00832532510

## 2022-09-20 MED ADMIN — POTASSIUM CHLORIDE IN WATER 10 MEQ/50 ML IV PGBK [11075]: 10 meq | INTRAVENOUS | @ 19:00:00 | Stop: 2022-09-20 | NDC 00338070541

## 2022-09-20 MED ADMIN — METOPROLOL SUCCINATE 25 MG PO TB24 [81866]: 12.5 mg | ORAL | @ 02:00:00 | NDC 00904632261

## 2022-09-20 MED ADMIN — POTASSIUM CHLORIDE IN WATER 10 MEQ/50 ML IV PGBK [11075]: 10 meq | INTRAVENOUS | @ 20:00:00 | Stop: 2022-09-20 | NDC 00338070541

## 2022-09-20 MED ADMIN — SODIUM CHLORIDE 0.9 % IV SOLP [27838]: 575 [IU]/h | INTRAVENOUS | @ 23:00:00 | NDC 00338004902

## 2022-09-20 MED ADMIN — HEPARIN (PORCINE) 10,000 UNIT/ML IJ SOLN [10177]: 575 [IU]/h | INTRAVENOUS | @ 23:00:00 | NDC 63323054209

## 2022-09-20 MED ADMIN — POTASSIUM CHLORIDE IN WATER 10 MEQ/50 ML IV PGBK [11075]: 10 meq | INTRAVENOUS | @ 18:00:00 | Stop: 2022-09-20 | NDC 00338070541

## 2022-09-21 ENCOUNTER — Inpatient Hospital Stay: Admit: 2022-09-21 | Discharge: 2022-09-21 | Payer: MEDICARE

## 2022-09-21 ENCOUNTER — Encounter: Admit: 2022-09-21 | Discharge: 2022-09-21 | Payer: MEDICARE

## 2022-09-21 MED ADMIN — BUMETANIDE 0.25 MG/ML IJ SOLN [9308]: 2 mg | INTRAVENOUS | @ 17:00:00 | Stop: 2022-09-21 | NDC 72205010101

## 2022-09-21 MED ADMIN — MILRINONE IN 5 % DEXTROSE 20 MG/100 ML (200 MCG/ML) IV PGBK [169596]: 0.25 ug/kg/min | INTRAVENOUS | @ 08:00:00 | NDC 55150028701

## 2022-09-21 MED ADMIN — MAGNESIUM SULFATE IN D5W 1 GRAM/100 ML IV PGBK [166578]: 1 g | INTRAVENOUS | @ 20:00:00 | Stop: 2022-09-21 | NDC 00264440054

## 2022-09-21 MED ADMIN — MAGNESIUM SULFATE IN D5W 1 GRAM/100 ML IV PGBK [166578]: 1 g | INTRAVENOUS | @ 02:00:00 | Stop: 2022-09-21 | NDC 00264440054

## 2022-09-21 MED ADMIN — POTASSIUM CHLORIDE 20 MEQ PO TBTQ [35943]: 20 meq | ORAL | @ 13:00:00 | Stop: 2022-09-21 | NDC 00832532510

## 2022-09-21 MED ADMIN — CLOPIDOGREL 75 MG PO TAB [78966]: 75 mg | ORAL | @ 13:00:00 | Stop: 2022-09-21 | NDC 00904629461

## 2022-09-21 MED ADMIN — AMOXICILLIN-POT CLAVULANATE 500-125 MG PO TAB [33227]: 500 mg | ORAL | @ 23:00:00 | Stop: 2022-09-21 | NDC 00093227434

## 2022-09-21 MED ADMIN — METOPROLOL SUCCINATE 25 MG PO TB24 [81866]: 12.5 mg | ORAL | @ 01:00:00 | NDC 00904632261

## 2022-09-21 MED ADMIN — AMOXICILLIN-POT CLAVULANATE 500-125 MG PO TAB [33227]: 500 mg | ORAL | @ 13:00:00 | Stop: 2022-09-21 | NDC 00093227434

## 2022-09-21 MED ADMIN — PANTOPRAZOLE 40 MG PO TBEC [80436]: 40 mg | ORAL | @ 17:00:00 | NDC 00904647461

## 2022-09-22 MED ADMIN — BUMETANIDE 0.5 MG PO TAB [9309]: 2 mg | ORAL | @ 17:00:00 | NDC 69238148901

## 2022-09-22 MED ADMIN — MAGNESIUM SULFATE IN D5W 1 GRAM/100 ML IV PGBK [166578]: 1 g | INTRAVENOUS | @ 20:00:00 | Stop: 2022-09-22 | NDC 00264440054

## 2022-09-22 MED ADMIN — ASPIRIN 81 MG PO CHEW [680]: 81 mg | ORAL | @ 14:00:00 | NDC 00904679430

## 2022-09-22 MED ADMIN — POTASSIUM CHLORIDE 20 MEQ PO TBTQ [35943]: 20 meq | ORAL | @ 09:00:00 | Stop: 2022-09-22 | NDC 00832532510

## 2022-09-22 MED ADMIN — HEPARIN (PORCINE) BOLUS FOR CONTINUOUS INFUSION (VIAL) - APTT MAIN [251015]: 1570 [IU] | INTRAVENOUS | @ 09:00:00 | Stop: 2022-09-22 | NDC 63323054005

## 2022-09-22 MED ADMIN — APIXABAN 5 MG PO TAB [315778]: 5 mg | ORAL | @ 23:00:00 | NDC 00003089431

## 2022-09-22 MED ADMIN — PROPOFOL INJ 10 MG/ML IV VIAL [210331]: 80 ug/kg/min | INTRAVENOUS | @ 19:00:00 | Stop: 2022-09-22 | NDC 25021060820

## 2022-09-22 MED ADMIN — ROSUVASTATIN 10 MG PO TAB [88503]: 10 mg | ORAL | @ 14:00:00 | NDC 00904677961

## 2022-09-22 MED ADMIN — MILRINONE IN 5 % DEXTROSE 20 MG/100 ML (200 MCG/ML) IV PGBK [169596]: 0.25 ug/kg/min | INTRAVENOUS | @ 02:00:00 | NDC 55150028701

## 2022-09-22 MED ADMIN — PANTOPRAZOLE 40 MG PO TBEC [80436]: 40 mg | ORAL | @ 16:00:00 | NDC 00904647461

## 2022-09-22 MED ADMIN — METOPROLOL SUCCINATE 25 MG PO TB24 [81866]: 12.5 mg | ORAL | @ 17:00:00 | Stop: 2022-09-22 | NDC 51079016901

## 2022-09-22 MED ADMIN — HEPARIN (PORCINE) 10,000 UNIT/ML IJ SOLN [10177]: 675 [IU]/h | INTRAVENOUS | @ 11:00:00 | Stop: 2022-09-22 | NDC 63323054209

## 2022-09-22 MED ADMIN — MILRINONE IN 5 % DEXTROSE 20 MG/100 ML (200 MCG/ML) IV PGBK [169596]: 0.25 ug/kg/min | INTRAVENOUS | @ 19:00:00 | NDC 55150028701

## 2022-09-22 MED ADMIN — METOPROLOL SUCCINATE 25 MG PO TB24 [81866]: 25 mg | ORAL | @ 02:00:00 | NDC 00904632261

## 2022-09-22 MED ADMIN — SODIUM CHLORIDE 0.9 % IV SOLP [27838]: 675 [IU]/h | INTRAVENOUS | @ 11:00:00 | Stop: 2022-09-22 | NDC 00338004902

## 2022-09-23 ENCOUNTER — Encounter: Admit: 2022-09-23 | Discharge: 2022-09-23 | Payer: MEDICARE

## 2022-09-23 ENCOUNTER — Inpatient Hospital Stay: Admit: 2022-09-23 | Discharge: 2022-09-23 | Payer: MEDICARE

## 2022-09-23 MED ADMIN — APIXABAN 5 MG PO TAB [315778]: 5 mg | ORAL | @ 14:00:00 | Stop: 2022-09-24 | NDC 00003089431

## 2022-09-23 MED ADMIN — POTASSIUM CHLORIDE 20 MEQ PO TBTQ [35943]: 60 meq | ORAL | @ 14:00:00 | Stop: 2022-09-23 | NDC 00832532510

## 2022-09-23 MED ADMIN — PANTOPRAZOLE 40 MG PO TBEC [80436]: 40 mg | ORAL | @ 16:00:00 | Stop: 2022-09-24 | NDC 00904647461

## 2022-09-23 MED ADMIN — MILRINONE IN 5 % DEXTROSE 20 MG/100 ML (200 MCG/ML) IV PGBK [169596]: 0.25 ug/kg/min | INTRAVENOUS | @ 10:00:00 | Stop: 2022-09-24 | NDC 55150028701

## 2022-09-23 MED ADMIN — ASPIRIN 81 MG PO CHEW [680]: 81 mg | ORAL | @ 14:00:00 | Stop: 2022-09-24 | NDC 00904679430

## 2022-09-23 MED ADMIN — BUMETANIDE 1 MG PO TAB [9310]: 2 mg | ORAL | @ 14:00:00 | Stop: 2022-09-23 | NDC 00904701606

## 2022-09-23 MED ADMIN — METOPROLOL SUCCINATE 25 MG PO TB24 [81866]: 25 mg | ORAL | @ 02:00:00 | Stop: 2022-09-23 | NDC 00904632261

## 2022-09-23 MED ADMIN — ROSUVASTATIN 20 MG PO TAB [88504]: 10 mg | ORAL | @ 14:00:00 | Stop: 2022-09-24 | NDC 68462026390

## 2022-09-23 MED FILL — METOLAZONE 5 MG PO TAB: 5 mg | ORAL | 30 days supply | Qty: 30 | Fill #1 | Status: CP

## 2022-09-23 MED FILL — ROSUVASTATIN 10 MG PO TAB: 10 mg | ORAL | 90 days supply | Qty: 90 | Fill #1 | Status: CP

## 2022-09-23 MED FILL — POTASSIUM CHLORIDE 20 MEQ PO TBTQ: 20 mEq | ORAL | 90 days supply | Qty: 90 | Fill #1 | Status: CP

## 2022-09-23 MED FILL — TORSEMIDE 20 MG PO TAB: 20 mg | ORAL | 30 days supply | Qty: 60 | Fill #1 | Status: CP

## 2022-09-23 MED FILL — METOPROLOL SUCCINATE 25 MG PO TB24: 25 mg | ORAL | 30 days supply | Qty: 45 | Fill #1 | Status: CP

## 2022-09-23 MED FILL — APIXABAN 5 MG PO TAB: 5 mg | ORAL | 30 days supply | Qty: 60 | Fill #1 | Status: CP

## 2022-09-24 ENCOUNTER — Encounter: Admit: 2022-09-24 | Discharge: 2022-09-24 | Payer: MEDICARE

## 2022-09-24 MED ORDER — PANTOPRAZOLE 40 MG PO TBEC
40 mg | ORAL_TABLET | Freq: Every day | ORAL | 1 refills | 90.00000 days | Status: AC
Start: 2022-09-24 — End: ?

## 2022-09-24 NOTE — Telephone Encounter
Patient Discharge Date from hospital: 09/23/22  Date Call Attempted: 09/24/22  Number of Attempts: 2  Date Call Completed:  09/24/22      Two Patient Identifier complete: Yes [x]     Next Appointment    Next follow-up appointment on  09/30/2022 at 1300 with [name], APRN at her [facility]    Does pt have transportation?  Yes [x]     No []    NA []      Home Health    [facility] Ph) [phone] Fax) [phone]  [facility] Ph) [phone] Fax) [phone]     Medications    Does pt have all medications? Yes  []     No [x]     Uloric, Protonix, ASA. Pt will pick up ASA and Uloric at [facility]. Protonix script sent to [facility] per protocol. Reviewed changes with pt.     START taking:  aspirin  Start taking on: Sep 24, 2022  ELIQUIS (apixaban)  metoprolol succinate XL (TOPROL XL)  milrinone (PRIMACOR)  pantoprazole DR (PROTONIX)  CHANGE how you take:  febuxostat (ULORIC)  metOLazone (ZAROXOLYN)  potassium chloride SR (K-DUR)  rosuvastatin (CRESTOR)  torsemide (DEMADEX)  STOP taking:  carvediloL 6.25 mg tablet (COREG)  clopiDOGreL 75 mg tablet (PLAVIX)  famotidine 20 mg tablet (PEPCID)  isosorbide mononitrate ER 30 mg tablet,extended  release 24 hr (IMDUR)    Diet    - 200 mg cholesterol, 2 G Na, 2 L fluid restriction   - 1600 and 2000 calories per day. This is equal to 60g (grams) of carbohydrates per meal, and 30g of carbohydrates for a bedtime snack     Is patient following prescribed diet and restrictions?  Yes [x]    No []      Scale/Weight    Does pt have a scale at home?  Yes [x]    No []     Did pt weight first thing this morning?  Yes []    No [x]      If yes, what was pt's first morning weight today?  Educated provided    Signs and Symptoms    Pt reports the following symptoms:     No BLE edema or upper abdominal bloating/chest tightness. No SOA.      PICC line dressing intact without drainage, increased redness/swelling.    Pt verbalized understanding of signs and symptoms of HF and when to contact a provider or seek immediate assistance at the ER.    Was pt given zone sheet? Yes [x]   No []     Intervention(s)    Pt educated on the importance of weighing daily first thing in the morning before dressing, before eating or drinking, and after voiding using the same scale in the same location and write results down in note pad or log. Notify us for weight gains of 3 lbs in one day or 5 lbs in one week. Notify your provider for increased SOA.  Notify your provider for swelling or increased swelling in BLE or abdominal fullness/bloating. Advised to check B/P at least once daily. Check 1-2 hours after am meds. Log results. Check B/P other times if feeling lightheaded, dizzy, or if you feel your heart rate is elevated. Document the time you checked and any symptoms you may be feeling at the time. Call 911 for sudden, severe chest/pain pressure/SOA develops. Be sure to keep your follow up appointment and bring your weight logs, B/P logs, and medication list with you to your appointment. Call us at  [phone] if you have any questions.    Tunneled Catheter Line Care As directed  Home Care Instructions:  The bandage over the catheter exit site must be kept clean and dry. Only a healthcare provider may change the  dressing. Place a waterproof covering, such as plastic wrap, over the catheter when showering. Never submerge the  catheter in a bathtub, hot tub, or swimming pool.    Plan of Care    Continued education needed for heart failure symptom management and when to contact our office.

## 2022-09-29 ENCOUNTER — Encounter: Admit: 2022-09-29 | Discharge: 2022-09-29 | Payer: MEDICARE

## 2022-09-29 DIAGNOSIS — R0609 Other forms of dyspnea: Secondary | ICD-10-CM

## 2022-09-29 DIAGNOSIS — I340 Nonrheumatic mitral (valve) insufficiency: Secondary | ICD-10-CM

## 2022-09-29 DIAGNOSIS — I5043 Acute on chronic combined systolic (congestive) and diastolic (congestive) heart failure: Secondary | ICD-10-CM

## 2022-09-29 DIAGNOSIS — R9389 Abnormal findings on diagnostic imaging of other specified body structures: Secondary | ICD-10-CM

## 2022-09-29 DIAGNOSIS — I361 Nonrheumatic tricuspid (valve) insufficiency: Secondary | ICD-10-CM

## 2022-09-29 DIAGNOSIS — K862 Cyst of pancreas: Secondary | ICD-10-CM

## 2022-09-29 DIAGNOSIS — C9000 Multiple myeloma not having achieved remission: Secondary | ICD-10-CM

## 2022-09-29 DIAGNOSIS — R570 Cardiogenic shock: Secondary | ICD-10-CM

## 2022-09-29 DIAGNOSIS — Z79899 Other long term (current) drug therapy: Secondary | ICD-10-CM

## 2022-09-29 DIAGNOSIS — N1832 Stage 3b chronic kidney disease (HCC): Secondary | ICD-10-CM

## 2022-09-29 DIAGNOSIS — K2900 Acute gastritis without bleeding: Secondary | ICD-10-CM

## 2022-09-29 DIAGNOSIS — K317 Polyp of stomach and duodenum: Secondary | ICD-10-CM

## 2022-09-30 ENCOUNTER — Encounter: Admit: 2022-09-30 | Discharge: 2022-09-30 | Payer: MEDICARE

## 2022-09-30 ENCOUNTER — Ambulatory Visit: Admit: 2022-09-30 | Discharge: 2022-09-30 | Payer: MEDICARE

## 2022-09-30 DIAGNOSIS — R609 Edema, unspecified: Secondary | ICD-10-CM

## 2022-09-30 DIAGNOSIS — R1900 Intra-abdominal and pelvic swelling, mass and lump, unspecified site: Secondary | ICD-10-CM

## 2022-09-30 DIAGNOSIS — E119 Type 2 diabetes mellitus without complications: Secondary | ICD-10-CM

## 2022-09-30 DIAGNOSIS — I10 Essential (primary) hypertension: Secondary | ICD-10-CM

## 2022-09-30 DIAGNOSIS — M1990 Unspecified osteoarthritis, unspecified site: Secondary | ICD-10-CM

## 2022-09-30 DIAGNOSIS — G4730 Sleep apnea, unspecified: Secondary | ICD-10-CM

## 2022-09-30 DIAGNOSIS — C55 Malignant neoplasm of uterus, part unspecified: Secondary | ICD-10-CM

## 2022-09-30 DIAGNOSIS — C801 Malignant (primary) neoplasm, unspecified: Secondary | ICD-10-CM

## 2022-09-30 DIAGNOSIS — F419 Anxiety disorder, unspecified: Secondary | ICD-10-CM

## 2022-09-30 DIAGNOSIS — J45909 Unspecified asthma, uncomplicated: Secondary | ICD-10-CM

## 2022-09-30 DIAGNOSIS — M549 Dorsalgia, unspecified: Secondary | ICD-10-CM

## 2022-09-30 DIAGNOSIS — I519 Heart disease, unspecified: Secondary | ICD-10-CM

## 2022-09-30 DIAGNOSIS — I6521 Occlusion and stenosis of right carotid artery: Secondary | ICD-10-CM

## 2022-09-30 DIAGNOSIS — E785 Hyperlipidemia, unspecified: Secondary | ICD-10-CM

## 2022-09-30 DIAGNOSIS — H547 Unspecified visual loss: Secondary | ICD-10-CM

## 2022-09-30 DIAGNOSIS — J449 Chronic obstructive pulmonary disease, unspecified: Secondary | ICD-10-CM

## 2022-09-30 DIAGNOSIS — I2510 Atherosclerotic heart disease of native coronary artery without angina pectoris: Secondary | ICD-10-CM

## 2022-09-30 DIAGNOSIS — Z79899 Other long term (current) drug therapy: Secondary | ICD-10-CM

## 2022-09-30 MED ORDER — TORSEMIDE 20 MG PO TAB
20 mg | ORAL_TABLET | Freq: Every day | ORAL | 1 refills | 67.50000 days | Status: AC
Start: 2022-09-30 — End: ?

## 2022-09-30 NOTE — Telephone Encounter
Call to pt for update on planned vacation to [facility] with request for pt to cb as DM would like to discuss pt heart function before pt decides to go to [facility] no answer LM w cb#

## 2022-09-30 NOTE — Progress Notes
Date of Service: 09/30/2022      HPI       I had the pleasure of seeing Raven [name], who is accompanied by a family member in the [facility] today for posthospital follow-up.    Patient is a 77 y.o. 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      She was inititaly admitted 5/22 to the medicine service for nausea/vomiting, poor oral intake, and worsening shortness of breath/orthopnea. A CT abd/pelvis demonstrated fat stranding at the gastric outlet/proximal duodenum, likely gastritis or duodenitis, pancreatic cysts, and numerous tiny, ill-defined nodular opacities in the lung bases, likely infectious or inflammatory. Surgery- Onc was consulted due her recent colon resection, with no acute surgical recommendations. GI was consulted and she under went EGD on 5/23 with normal esophagus, 3cm hiatal hernia, congestive gastropathy, erythematous mucosa biopsied in prepyloric region 6mm sessile polyp in gastric body, 2 polyps in duodenum, poylps not removed due to Plavix taken 5/21. She completed course of Augmentin for possible gastritis/duodenitis.      Cardiology was consulted for assistance with her acute on chronic HFrEF. Her NT-proBNP was 16,500. An echocardiogram showed an EF 30% with severe TR and mod-severe MR. She was diuresed with lasix but her creatinine continued to worsen with diuretics. On 09/18/2022, she was taken for a RHC which demonstrated elevated filling pressures with marginal indices. She was transferred to CICU with leave in [place]. She was started on milrinone and aggressively diuresed with IV bumex. She also developed new onset atrial fibrillation and was started on IV heparin and beta blocker for rate control. She was transitioned to Eliquis prior to discharge. Her BB was up titrated for PVC frequency. Her PTA Plavix was discontinued 5/27 and she was started on bASA to allow intervention from GI prospective in near future. After outpatient GI procedures with biopsy, if biopsies are negative for malignancy, plan to evaluate for potential mitral valve intervention in future. Palliative care was consulted given multiple co morbidities with plan to follow along outpatient. She was continued on milrinone 0.55mcg/kg/min at discharge. She will be seen in post hospital follow up on 09/30/22 by Raven Abraham, APRN. Follow up labs will be performed 09/28/22.         The patient, born on 03-Sep-1945, presents for a follow-up visit after a recent hospitalization for heart failure. She reports feeling well since discharge, with the exception of some residual dyspnea. The patient notes that she can comfortably walk short distances, such as from her car to the clinic, but becomes short of breath with further exertion. She utilizes a walker and a scooter for mobility assistance.    The patient has been attempting to monitor her weight daily, but reports issues with her scale. She sleeps well, using a CPAP machine for sleep apnea, and denies any nocturnal cough or dyspnea. She also denies any chest pressure, tightness, or palpitations.    The patient is responsible for managing her own medications, which include Eliquis 5 mg twice daily, metolazone as needed, metoprolol (one and a half pills), milrinone, Crestor, and torsemide (two 20 mg pills daily). She reports no vomiting or blood in her  stool, but did note a small amount of blood in her nasal mucus upon blowing her nose.    In addition to her cardiac issues, the patient has a history of multiple myeloma, which is reportedly under control. She receives weekly treatments, including a shot to boost her blood count, at an oncology clinic. She also has a port for infusions. Her most recent labs showed a slight increase in creatinine and a drop in hemoglobin, which may necessitate a blood transfusion during her next chemotherapy session.          Patient Discharge Date from hospital: 09/23/22  Date Call Attempted: 09/24/22  Number of Attempts: 2  Date Call Completed:  09/24/22        Two Patient Identifier complete: Yes [x]      Next Appointment     Next follow-up appointment on  09/30/2022 at 1300 with Raven [name]               Vitals:    09/30/22 1252   BP: 109/55   BP Source: Arm, Right Upper   Pulse: 58   SpO2: 98%   O2 Device: None (Room air)   PainSc: Zero   Weight: 75.8 kg (167 lb)   Height: 157.5 cm (5' 2)     Body mass index is 30.54 kg/m?Marland Kitchen    Past Medical History  Patient Active Problem List    Diagnosis Date Noted    Acute gastritis without hemorrhage 09/23/2022    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    Goals of care, counseling/discussion 09/22/2022    Receiving inotropic medication 09/19/2022    Hypokalemia 09/19/2022    Coronary artery disease 09/19/2022    Hx of CABG 09/19/2022    Type 2 diabetes mellitus (HCC) 09/19/2022    Chronic kidney disease 09/19/2022    Mitral valve insufficiency 09/19/2022    Abnormal finding on imaging 09/18/2022    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    Gastric polyp 09/18/2022    Polyp of duodenum 09/18/2022    Acute on chronic combined systolic and diastolic CHF (congestive heart failure) (HCC) 09/16/2022    Diverticulitis 08/10/2022    Multiple myeloma not having achieved remission (HCC) 01/05/2020       ROS:See HPI      Physical Exam  General Appearance: In NAD, pale  Neck Veins: normal JVP, neck veins are not distended; no HJR   Chest Inspection: right SCL dressing CDI  Respiratory Effort: breathing comfortably, no respiratory distress   Auscultation/Percussion: lungs clear to auscultation, no rales, rhonchi or wheezing   Cardiac Rhythm: regular rhythm and tachycardic rate Cardiac Auscultation: S1, S2 normal, no rub, no definite S3  or S4   Murmurs: no murmur   Peripheral Circulation: normal peripheral circulation   Pedal Pulses: normal symmetric pedal pulses   Lower Extremity Edema: no lower extremity edema   Abdominal Exam: soft, non-tender, no obvious masses, bowel sounds normal   Gait & Station: walks without assistance   Orientation: oriented to person, place and time   Affect & Mood: appropriate and sustained affect   Language and Memory: patient responsive and seems to comprehend information   Neurologic Exam: neurological assessment grossly intact   Vital signs were reviewed      Cardiovascular Studies:    Echocardiographic Findings 09/18/2022     Left Ventricle The left ventricular wall thickness is normal. Normal geometry. The left ventricular systolic function is moderately reduced. The visually estimated ejection fraction is 30%. Unable to assess left ventricular diastolic function.  Unable to assess left atrial pressure.   Right Ventricle The right ventricle is mildly dilated. The right ventricular systolic function is mildly reduced. Pacemaker lead present in the ventricle.   Left Atrium Mildly dilated.   Right Atrium Not well seen. Normal size. Pacemaker lead present in the right atrium.   IVC/SVC Inferior vena cava was not well seen; right atrial pressure could not be estimated.   Mitral Valve No stenosis. Moderate to severe regurgitation. There is mild mitral annular calcification without stenosis.   Tricuspid Valve Valve has a dilated annulus. No stenosis. Severe regurgitation.   Aortic Valve The aortic valve was not well seen. Trace regurgitation.   Pulmonary The pulmonic valve was not seen well but no Doppler evidence of stenosis.   Aorta The ascending aorta is mildly dilated.   Pericardium No pericardial effusion.   Preliminary read of EKG today shows sinus tachycardia with ventricular rate 126 bpm   5/24 RHC RA 13, RV 70/12, PA 72/27/47, PCWP 17, CO 1.9, CI 1.1 via FICK, CO 2.5, CI 1.4 via thermo with PA sat 41%.      Gastric biopsy 09/17/22:  Oxyntic-type mucosa with focally active gastritis.   Antral-type mucosa with features of reactive gastropathy.   Immunohistochemical stain for H. pylori is negative.      Problems Addressed Today  No diagnosis found.           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    The patient today describes NYHA Functional Class III, stage C symptoms.    >Diuretic Therapy   Current Dose:  torsemide 40 mg daily Changes:    Reduce torsemide to 20 mg daily     Patient appears hypovolemic today   Estimated dry weight is 170.     I reviewed most recent labs and most recent cardiac testing with patient and family.  Shared medical decision making involves eliciting patient and/or family preferences, education and explaining risks and benefits of management options.  HF Therapy:   GDMT Current Dose Changes made at visit   BB Toprol-XL 37.5 mg daily    ACEI/ARB/ARNI None, CKD    Aldosterone antagonist None, CKD    SGLT2i None PTA, at next office visit can consider addition of this    Hydralazine/Nitrate Not applicable, on inotropes    Ivabradine Not applicable    Cardiac rehab Not applicable    Advanced Therapies Candidacy/Contraindications   N/A            Ambulatory cardiogenic shock: She underwent right heart cath on 09/18/2022 that showed cardiac index of 1.1, she was started on milrinone, currently on 0.25 mcg/kg/min  Multiple myeloma: Follows at [facility], was seen during this admission by Dr. Mitchell Heir.  She is scheduled to have chemotherapy tomorrow.  -She is anemic with CBC dropping from 8.6-7.7, she tells me that if her hemoglobin is less than 8 they usually give her a unit of blood, she will be seen in oncology tomorrow  Lab Results   Component Value Date    KAPPAFLC 1.65 09/19/2022    LAMBDAFLC 0.81 09/19/2022    FREELCRAT 2.04 (H) 09/19/2022    IMMUNOFIXS  09/19/2022     NO PARAPROTEIN SEEN  CONFIRMED BY IMMUNOFIXATION GEL      NTPROBNP 17,048.0 (H) 09/18/2022    NTPROBNP 16,488.0 (H) 09/16/2022       [employer] ICD in Situ  - Device Interrogation: Presenting rhythm:  VS, irregular,  Suggestive of Afib, V rates 60s-90s bpm    HR histogram since 09/02/22 shows V rates >90bpm ~60% of time, > 110bpm ~40%     Atrial fibrillation; new   - Rates 90's -100's  > Continue Eliquis 5mg  BID   > Toprol as above   > If rate uncontrolled could consider AVN ablation in future          CAD  s/p CABG x3 '10  hx PCI in '10  HLD  Carotid artery disease with prior endarterectomy '16  - PTA Plavix s/p CEA, this was stopped 09/21/22 and started on bASA so intervention by GI could be performed in outpt setting       COPD   OSA with CPAP use  - Oxygenating well on room air  - Reports improvement of shortness of breath since admission  - PTA Proair    Hx diverticulosis s/p sigmoid colon mass resection 08/10/22  Pancreatic cysts  Fat stranding at gastric outlet/proximal duodenum  - Recent surgery with surgical oncology for colonic mass resection, found to be diverticulitis. No signs of malignancy.  - BC on admission Negative   - CT abd/pelv 5/22: RUQ fat abutting the gastric outlet, possibly gastritis or duodenitis. Several cystic lesions in tail of pancreas  - EGD 5/23: normal esophagus, 3cm hiatal hernia, congestive gastropathy, erythematous mucosa biopsied in prepyloric region, 6mm sessile polyp in gastric  body, 2 polyps in duodenum, poylps not removed due to Plavix taken 5/21  - No surgical intervention per surgical oncology   - Completed course of Augmentin 500mg  BID for possible gastritis/duodenitis         CKD stage III:  Lab Results   Component Value Date    CR 1.85 (H) 09/28/2022    CR 1.76 (H) 09/23/2022    CR 1.77 (H) 09/22/2022           Diabetes Mellitus Type II- Managed by Primary team.    Lab Results   Component Value Date/Time    HGBA1C 8.0 (H) 09/16/2022 07:02 PM    HGBA1C 7.4 (H) 07/14/2022 02:44 PM         Lab Results   Component Value Date    CHOL 107 09/16/2022    TRIG 72 09/16/2022    HDL 50 09/16/2022    LDL 45 09/16/2022    VLDL 14 09/16/2022    NONHDLCHOL 57 09/16/2022   .         Follow up appointment: 6/28 with me (, it sounds like she is planning on a trip to [facility], she did not discuss that with me, I am concerned about her recently being discharged from cardiac ICU, being on inotropes and having a heart rate of 126 at rest.  She might need a follow-up phone call on Friday to see how she is doing, however her hemoglobin is doing.    I have personally documented the HPI, exam and medical decision making.  Patient education: I reviewed recent lab results and current medications, medication instructions, discussed heart failure signs & symptoms,  low  sodium diet, fluid restriction and daily weights.I have instructed the patient on the plan of care and they verbalize understanding of the plan. Please see AVS for full patient teaching. Patient advised to call our office if s/he has any problems, questions, worsening symptoms, or concerns prior to the next appointment.   Thanks for allowing me to see this nice patient. If I can be of additional assistance, please don't hesitate  to contact me.     Current Outpatient Medications on File Prior to Visit   Medication Sig Dispense Refill    acetaminophen (TYLENOL) 325 mg tablet Take three tablets by mouth every 8 hours as needed for Pain. Indications: pain 40 tablet 0    acyclovir (ZOVIRAX) 400 mg tablet Take two tablets by mouth every 7 days.      albuterol sulfate (PROAIR HFA) 90 mcg/actuation HFA aerosol inhaler Inhale two puffs by mouth into the lungs every 6 hours as needed for Wheezing or Shortness of Breath.      apixaban (ELIQUIS) 5 mg tablet Take one tablet by mouth twice daily. Indications: treatment to prevent blood clots in chronic atrial fibrillation 180 tablet 0    aspirin 81 mg chewable tablet Chew one tablet by mouth daily. Indications: stroke prevention cholecalciferol (VITAMIN D) 1,000 units tablet Take one tablet by mouth daily.      cyanocobalamin (VITAMIN B-12) 1,000 mcg tablet Take 5,000 Units by mouth daily.      dexAMETHasone (DECADRON) 4 mg tablet TAKE 5 TABLETS(20 MG) BY MOUTH WEEKLY      dicyclomine (BENTYL) 10 mg capsule Take one capsule by mouth daily.      EMU OIL (BULK) MISC Use  as directed. Apply topically as needed      febuxostat (ULORIC) 40 mg tablet Take one-half tablet by mouth daily. Indications: taken on chemo days 45 tablet 0    ferrous sulfate (FEOSOL) 325 mg (65 mg iron) tablet Take one tablet by mouth daily. Take on an empty stomach at least 1 hour before or 2 hours after food.      insulin aspart (U-100) (NOVOLOG FLEXPEN U-100 INSULIN) 100 unit/mL (3 mL) PEN Inject  under the skin. Per sliding scale ( she doesn't have sheet with her)      insulin detemir U-100 (LEVEMIR FLEXTOUCH) 100 unit/mL (3 mL) injection pen Inject twenty five Units under the skin at bedtime daily.      metOLazone (ZAROXOLYN) 5 mg tablet Take one tablet by mouth as Needed (For weight gain of 3lbs in 24 hrs). Indications: accumulation of fluid resulting from chronic heart failure 30 tablet 0    metoprolol succinate XL (TOPROL XL) 25 mg extended release tablet Take 1.5 tablets by mouth daily. Indications: chronic heart failure, ventricular rate control in atrial fibrillation 90 tablet 1    milrinone (PRIMACOR) 200 mcg/mL infusion Administer 19.575 mcg/min through vein Continuous. Indications: sudden and serious symptoms of heart failure called acute decompensated heart failure      nitroglycerin (NITROSTAT) 0.4 mg tablet Place one tablet under tongue every 5 minutes as needed for Chest Pain.      ONETOUCH VERIO TEST STRIPS test strip USE STRIP TO CHECK GLUCOSE TWICE DAILY FOR DIABETES MELLITUS      pantoprazole DR (PROTONIX) 40 mg tablet Take one tablet by mouth daily. Indications: gastritis 90 tablet 1    potassium chloride SR (K-DUR) 20 mEq tablet Take one tablet by mouth daily. Indications: prevention of low potassium in the blood 90 tablet 1    prochlorperazine maleate (COMPAZINE) 10 mg tablet Take one tablet by mouth every 6 hours as needed.      rosuvastatin (CRESTOR) 10 mg tablet Take one tablet by mouth daily. Indications: hardening of the arteries due to plaque buildup 90 tablet 1    sertraline (ZOLOFT) 50 mg tablet Take one tablet by mouth daily.      torsemide (DEMADEX) 20 mg tablet Take two tablets by  mouth daily. Indications: accumulation of fluid resulting from chronic heart failure 60 tablet 1     No current facility-administered medications on file prior to visit.       Raven [name] AGPCNP-BC, CHFN,HF-Cert  Advanced Heart Failure APP  [facility]  Collaborating physician: Dr. Vanetta Shawl      Plan:  The following is a copy of the written instructions and plan I gave to the patient during her office visit.    There are no Patient Instructions on file for this visit.    Future Appointments   Date Time Provider Department Center   10/28/2022  1:00 PM Jarrett Ables, APRN-NP [email or web address]   03/10/2023  2:20 PM Dahiya, Juliann Pulse [facility]

## 2022-10-01 ENCOUNTER — Encounter: Admit: 2022-10-01 | Discharge: 2022-10-01 | Payer: MEDICARE

## 2022-10-02 ENCOUNTER — Encounter: Admit: 2022-10-02 | Discharge: 2022-10-02 | Payer: MEDICARE

## 2022-10-02 NOTE — Telephone Encounter
Received call from [facility] reporting Dr. Chales Abrahams wants to speak with Dr. Bing Matter about pts care, call back number [phone]

## 2022-10-05 ENCOUNTER — Encounter: Admit: 2022-10-05 | Discharge: 2022-10-05 | Payer: MEDICARE

## 2022-10-05 NOTE — Telephone Encounter
See encounter from 6/7

## 2022-10-05 NOTE — Telephone Encounter
Spoke with pt, she is concerned with milrinone line stating her oncology provider says it is too close to her port. Pt says she was not able to get chemo due to this. Pt asks that oncology with [facility] be contacted, sge sees Dr. Chales Abrahams.

## 2022-10-06 ENCOUNTER — Encounter: Admit: 2022-10-06 | Discharge: 2022-10-06 | Payer: MEDICARE

## 2022-10-06 NOTE — Telephone Encounter
Received call from [facility] stating that the patient is nearing ERI and when they called her about an alert from her device she stated that she has transferred care to [facility]. Transfer request has been submitted. BC

## 2022-10-07 ENCOUNTER — Encounter: Admit: 2022-10-07 | Discharge: 2022-10-07 | Payer: MEDICARE

## 2022-10-07 NOTE — Telephone Encounter
Spoke to pt on the phone she reports no signs of infection, no fever, no chills, she denies any drainage/heat/discoloration at site of the line. Will call if she develops any.

## 2022-10-07 NOTE — Telephone Encounter
-----   Message from Cherly Beach sent at 10/06/2022  5:32 PM CDT -----  Raven [name] weekly inotrope labs show white blood cell elevation of 13.58 from 9.76 a week ago, her hemoglobin has dropped from 7.7-7.3, her chemistry shows stable potassium of 4.2, creatinine is stable as well.  Can you please check with her tomorrow to see if she has any fever or chills or any drainage from her inotrope site?  I think she was leaving for [facility] (I did not encourage this as she is on inotropes with low hemoglobin and fast heart rate)

## 2022-10-08 ENCOUNTER — Encounter: Admit: 2022-10-08 | Discharge: 2022-10-08 | Payer: MEDICARE

## 2022-10-08 NOTE — Telephone Encounter
Received call from APRN Eber Jones with [facility] oncology called reporting pts Hemoglobin 7.5 today so blood was given and pts WBC was over 15000 which is higher than it was previously. Call back number if needed is [phone]

## 2022-10-10 ENCOUNTER — Encounter: Admit: 2022-10-10 | Discharge: 2022-10-10 | Payer: MEDICARE

## 2022-10-10 NOTE — Telephone Encounter
Received AMS notification regarding weight gain.  I returned call to [facility] Debbie at [phone] and she reports patient weight yesterday was 169.6 and today was 172.4.  Patient reports an increase in lower extremity swelling, but otherwise feels well.  Patient denies increase in SOA, but reports oxygen saturation of 86%.  HHRN reports patient was not at home to re-check that and her oximeter does not read well a lot of times.  BP today 116/65, HR 109.  Both normal for patient.  Patient taking torsemide 20mg  daily (reduced from 40mg  daily on 6/5).  Patient has f/u on 6/28.  I told HHRN I would reach out to provider and call back with recommendation.    Message sent to Bunnie Philips through AMS connect.

## 2022-10-10 NOTE — Telephone Encounter
Received message from Bunnie Philips:    Take one extra dose of torsemide 20mg  this afternoon.  She should take an extra dose of potassium chloride with this extra dose of torsemide.    I called and reviewed with Debbie, [facility], who verbalizes understanding and is going to call instruction to patient.

## 2022-10-13 ENCOUNTER — Encounter: Admit: 2022-10-13 | Discharge: 2022-10-13 | Payer: MEDICARE

## 2022-10-13 DIAGNOSIS — K5792 Diverticulitis of intestine, part unspecified, without perforation or abscess without bleeding: Secondary | ICD-10-CM

## 2022-10-13 LAB — CBC
HEMATOCRIT: 9.4 — ABNORMAL LOW (ref 12–15)
HEMOGLOBIN: 9.4 — ABNORMAL LOW (ref 12–15)
MCH: 29
MCHC: 31 — ABNORMAL LOW (ref 32–36)
MCV: 94
MPV: 10
PLATELET COUNT: 368
RBC COUNT: 3.2 — ABNORMAL LOW (ref 4–5)
RDW: 20 — ABNORMAL LOW (ref 11.5–14.5)
WBC COUNT: 17 — ABNORMAL HIGH (ref 4–11)

## 2022-10-14 ENCOUNTER — Encounter: Admit: 2022-10-14 | Discharge: 2022-10-14 | Payer: MEDICARE

## 2022-10-16 ENCOUNTER — Encounter: Admit: 2022-10-16 | Discharge: 2022-10-16 | Payer: MEDICARE

## 2022-10-19 ENCOUNTER — Encounter: Admit: 2022-10-19 | Discharge: 2022-10-19 | Payer: MEDICARE

## 2022-10-20 ENCOUNTER — Encounter: Admit: 2022-10-20 | Discharge: 2022-10-20 | Payer: MEDICARE

## 2022-10-20 DIAGNOSIS — I5043 Acute on chronic combined systolic (congestive) and diastolic (congestive) heart failure: Secondary | ICD-10-CM

## 2022-10-21 ENCOUNTER — Encounter: Admit: 2022-10-21 | Discharge: 2022-10-21 | Payer: MEDICARE

## 2022-10-22 ENCOUNTER — Encounter: Admit: 2022-10-22 | Discharge: 2022-10-22 | Payer: MEDICARE

## 2022-10-23 ENCOUNTER — Encounter: Admit: 2022-10-23 | Discharge: 2022-10-23 | Payer: MEDICARE

## 2022-10-23 ENCOUNTER — Ambulatory Visit: Admit: 2022-10-23 | Discharge: 2022-10-23 | Payer: MEDICARE

## 2022-10-23 DIAGNOSIS — I340 Nonrheumatic mitral (valve) insufficiency: Secondary | ICD-10-CM

## 2022-10-23 DIAGNOSIS — Z79899 Other long term (current) drug therapy: Secondary | ICD-10-CM

## 2022-10-23 DIAGNOSIS — I5043 Acute on chronic combined systolic (congestive) and diastolic (congestive) heart failure: Secondary | ICD-10-CM

## 2022-10-23 DIAGNOSIS — Z951 Presence of aortocoronary bypass graft: Secondary | ICD-10-CM

## 2022-10-23 DIAGNOSIS — I071 Rheumatic tricuspid insufficiency: Secondary | ICD-10-CM

## 2022-10-23 DIAGNOSIS — J449 Chronic obstructive pulmonary disease, unspecified: Secondary | ICD-10-CM

## 2022-10-23 DIAGNOSIS — K5792 Diverticulitis of intestine, part unspecified, without perforation or abscess without bleeding: Secondary | ICD-10-CM

## 2022-10-23 DIAGNOSIS — R1900 Intra-abdominal and pelvic swelling, mass and lump, unspecified site: Secondary | ICD-10-CM

## 2022-10-23 DIAGNOSIS — I2510 Atherosclerotic heart disease of native coronary artery without angina pectoris: Secondary | ICD-10-CM

## 2022-10-23 DIAGNOSIS — E119 Type 2 diabetes mellitus without complications: Secondary | ICD-10-CM

## 2022-10-23 DIAGNOSIS — I519 Heart disease, unspecified: Secondary | ICD-10-CM

## 2022-10-23 DIAGNOSIS — M549 Dorsalgia, unspecified: Secondary | ICD-10-CM

## 2022-10-23 DIAGNOSIS — M1990 Unspecified osteoarthritis, unspecified site: Secondary | ICD-10-CM

## 2022-10-23 DIAGNOSIS — F419 Anxiety disorder, unspecified: Secondary | ICD-10-CM

## 2022-10-23 DIAGNOSIS — H547 Unspecified visual loss: Secondary | ICD-10-CM

## 2022-10-23 DIAGNOSIS — J45909 Unspecified asthma, uncomplicated: Secondary | ICD-10-CM

## 2022-10-23 DIAGNOSIS — I6521 Occlusion and stenosis of right carotid artery: Secondary | ICD-10-CM

## 2022-10-23 DIAGNOSIS — C55 Malignant neoplasm of uterus, part unspecified: Secondary | ICD-10-CM

## 2022-10-23 DIAGNOSIS — R609 Edema, unspecified: Secondary | ICD-10-CM

## 2022-10-23 DIAGNOSIS — G4730 Sleep apnea, unspecified: Secondary | ICD-10-CM

## 2022-10-23 DIAGNOSIS — C801 Malignant (primary) neoplasm, unspecified: Secondary | ICD-10-CM

## 2022-10-23 DIAGNOSIS — E785 Hyperlipidemia, unspecified: Secondary | ICD-10-CM

## 2022-10-23 DIAGNOSIS — I10 Essential (primary) hypertension: Secondary | ICD-10-CM

## 2022-10-23 LAB — BASIC METABOLIC PANEL
ANION GAP: 15 — ABNORMAL HIGH (ref 3–12)
BLD UREA NITROGEN: 48 mg/dL — ABNORMAL HIGH (ref 7–25)
CALCIUM: 9.3 mg/dL (ref 8.5–10.6)
CHLORIDE: 100 MMOL/L (ref 98–110)
CO2: 22 MMOL/L (ref 21–30)
CREATININE: 1.8 mg/dL — ABNORMAL HIGH (ref 0.4–1.00)
EGFR: 28 mL/min — ABNORMAL LOW (ref >60)
GLUCOSE,PANEL: 354 mg/dL — ABNORMAL HIGH (ref 70–100)
POTASSIUM: 4.8 MMOL/L (ref 3.5–5.1)
SODIUM: 137 MMOL/L (ref 137–147)

## 2022-10-23 MED ORDER — TORSEMIDE 20 MG PO TAB
20 mg | ORAL | 0 refills | 67.50000 days | Status: AC
Start: 2022-10-23 — End: ?

## 2022-10-23 NOTE — Progress Notes
Date of Service: 10/23/2022      HPI       I had the pleasure of seeing Raven [name], who is accompanied by a family member in the [facility] today for heart failure follow-up.    Patient is a 77 y.o. 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      She was inititaly admitted 5/22 to the medicine service for nausea/vomiting, poor oral intake, and worsening shortness of breath/orthopnea. A CT abd/pelvis demonstrated fat stranding at the gastric outlet/proximal duodenum, likely gastritis or duodenitis, pancreatic cysts, and numerous tiny, ill-defined nodular opacities in the lung bases, likely infectious or inflammatory. Surgery- Onc was consulted due her recent colon resection, with no acute surgical recommendations. GI was consulted and she under went EGD on 5/23 with normal esophagus, 3cm hiatal hernia, congestive gastropathy, erythematous mucosa biopsied in prepyloric region 6mm sessile polyp in gastric body, 2 polyps in duodenum, poylps not removed due to Plavix taken 5/21. She completed course of Augmentin for possible gastritis/duodenitis.      Cardiology was consulted for assistance with her acute on chronic HFrEF. Her NT-proBNP was 16,500. An echocardiogram showed an EF 30% with severe TR and mod-severe MR. She was diuresed with lasix but her creatinine continued to worsen with diuretics. On 09/18/2022, she was taken for a RHC which demonstrated elevated filling pressures with marginal indices. She was transferred to CICU with leave in [place]. She was started on milrinone and aggressively diuresed with IV bumex. She also developed new onset atrial fibrillation and was started on IV heparin and beta blocker for rate control. She was transitioned to Eliquis prior to discharge. Her BB was up titrated for PVC frequency. Her PTA Plavix was discontinued 5/27 and she was started on bASA to allow intervention from GI prospective in near future. After outpatient GI procedures with biopsy, if biopsies are negative for malignancy, plan to evaluate for potential mitral valve intervention in future. Palliative care was consulted given multiple co morbidities with plan to follow along outpatient. She was continued on milrinone 0.36mcg/kg/min at discharge.          I had last seen her in clinic for posthospital follow-up on 09/30/2022, at that office visit I had reduced her torsemide from 40 mg daily to 20 mg daily.      TODAY'S VISIT     The patient, with a history of myeloma, presents for a post-hospital follow-up. She reports her breathing is same as usual, with limited ambulation due to shortness of breath. She has a persistent cough, for which medication was recently prescribed. She denies fever, chills, chest pain, pressure, and palpitations. She has a home health nurse who provided a scale and pulse monitor; the patient reports her pulse usually runs high, around 110.    The patient is on Eliquis 5mg  twice a day and denies any issues with vomiting blood or blood in stools. She has noticed an increase in nocturnal sweating, with her gown becoming soaked, but denies any associated fever.    She has a port for her myeloma infusion, which shows some redness but no drainage. She also reports unexplained bruising and marks appearing  on her skin.    The patient is also on Torsemide 20mg  once a day for fluid management, which she only takes if she gains two pounds. She reports it has been a long time since she last needed to take it.    She recently had chemotherapy and is on dexamethasone as part of her treatment regimen. She has not seen her primary care physician since her hospital discharge.            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    Goals of care, counseling/discussion 09/22/2022    Receiving inotropic medication 09/19/2022    Hypokalemia 09/19/2022    Coronary artery disease 09/19/2022    Hx of CABG 09/19/2022    Type 2 diabetes mellitus (HCC) 09/19/2022    Chronic kidney disease 09/19/2022    Mitral valve insufficiency 09/19/2022    Abnormal finding on imaging 09/18/2022    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    Gastric polyp 09/18/2022    Polyp of duodenum 09/18/2022    Acute on chronic combined systolic and diastolic CHF (congestive heart failure) (HCC) 09/16/2022    Diverticulitis 08/10/2022    Multiple myeloma not having achieved remission (HCC) 01/05/2020       ROS:See HPI      Physical Exam  General Appearance: In NAD, pale  Neck Veins: normal JVP, neck veins are not distended; no HJR   Chest Inspection: right SCL dressing CDI, slight redness noted at insertion site, no drainage or palpable cord noted  Respiratory Effort: breathing comfortably, no respiratory distress   Auscultation/Percussion: lungs clear to auscultation, no rales, rhonchi or wheezing   Cardiac Rhythm: regular rhythm and tachycardic rate   Cardiac Auscultation: S1, S2 normal, no rub, no definite S3  or S4   Murmurs: no murmur   Peripheral Circulation: normal peripheral circulation   Pedal Pulses: normal symmetric pedal pulses   Lower Extremity Edema: no lower extremity edema   Abdominal Exam: soft, non-tender, no obvious masses, bowel sounds normal   Gait & Station: walks without assistance   Orientation: oriented to person, place and time   Affect & Mood: appropriate and sustained affect   Language and Memory: patient responsive and seems to comprehend information   Neurologic Exam: neurological assessment grossly intact   Vital signs were reviewed      Cardiovascular Studies:Echocardiographic Findings 09/18/2022   Left Ventricle The left ventricular wall thickness is normal. Normal geometry. The left ventricular systolic function is moderately reduced. The visually estimated ejection fraction is 30%. Unable to assess left ventricular diastolic function. Unable to assess left atrial pressure.   Right Ventricle The right ventricle is mildly dilated. The right ventricular systolic function is mildly reduced. Pacemaker lead present in the ventricle.   Left Atrium Mildly dilated.   Right Atrium Not well seen. Normal size. Pacemaker lead present in the right atrium.   IVC/SVC Inferior vena cava was not well seen; right atrial pressure could  not be estimated.   Mitral Valve No stenosis. Moderate to severe regurgitation. There is mild mitral annular calcification without stenosis.   Tricuspid Valve Valve has a dilated annulus. No stenosis. Severe regurgitation.   Aortic Valve The aortic valve was not well seen. Trace regurgitation.   Pulmonary The pulmonic valve was not seen well but no Doppler evidence of stenosis.   Aorta The ascending aorta is mildly dilated.   Pericardium No pericardial effusion.   Preliminary read of EKG today shows sinus tachycardia with ventricular rate 126 bpm   5/24 RHC RA 13, RV 70/12, PA 72/27/47, PCWP 17, CO 1.9, CI 1.1 via FICK, CO 2.5, CI 1.4 via thermo with PA sat 41%.      Gastric biopsy 09/17/22:  Oxyntic-type mucosa with focally active gastritis.   Antral-type mucosa with features of reactive gastropathy.   Immunohistochemical stain for H. pylori is negative.      Problems Addressed Today  No diagnosis found.           Assessment/Plan:  HFrEF due to ICM  The most recent echo as noted above. The patient today describes NYHA Functional Class III, stage C symptoms.    >Diuretic Therapy   Current Dose:  torsemide 20 mg daily and metolazone 5 mg as needed weight gain 3 pounds overnight or 5 pounds in 1 week Changes:   6/28: Will change the torsemide to 20 mg every other day, DC the metolazone.  I think she needs an NT proBNP today     She appears euvolemic on physical exam, estimated dry weight is 165 pounds.  I reviewed most recent labs and most recent cardiac testing with patient and family.  Shared medical decision making involves eliciting patient and/or family preferences, education and explaining risks and benefits of management options.  HF Therapy:   GDMT Current Dose Changes made at visit   BB Toprol-XL 37.5 mg daily    ACEI/ARB/ARNI None, CKD    Aldosterone antagonist None, CKD    SGLT2i None PTA, at next office visit can consider addition of this    Hydralazine/Nitrate Not applicable, on inotropes    Ivabradine Not applicable    Cardiac rehab Not applicable    Advanced Therapies Candidacy/Contraindications   N/A            Ambulatory cardiogenic shock: She underwent right heart cath on 09/18/2022 that showed cardiac index of 1.1, she was started on milrinone, currently on 0.25 mcg/kg/min  -She remains tachycardic, she has elevated white blood cell count that is difficult to ascertain whether this is related to her dexamethasone when she gets chemotherapy.  She had initially denied any fever or chills however she sometimes wakes up with her nightgown soaked.  There is slight redness at the insertion site of her tunneled PICC line, she tells me her chemotherapy port is right next to it And they are unable to access the port for chemotherapy.  I think we need to get a set of blood cultures on her just to make sure that this is not having any infection, if there are are any changes in blood cultures then we may need to discontinue the PICC line and put it in the other arm.  -I will update Dr. Hedda Slade who discharged her and Dr. [name] who will see her next in September    Multiple myeloma: Follows at [facility], was seen during this admission by Dr. Mitchell Heir.    -She had chemotherapy on 6/27 at [facility] oncology she  was given Darzalex subcutaneous as well as Retacrit.  Next oncology G visit is scheduled on 7/11  -  Lab Results   Component Value Date    KAPPAFLC 1.65 09/19/2022    LAMBDAFLC 0.81 09/19/2022    FREELCRAT 2.04 (H) 09/19/2022    IMMUNOFIXS  09/19/2022     NO PARAPROTEIN SEEN  CONFIRMED BY IMMUNOFIXATION GEL      NTPROBNP 17,048.0 (H) 09/18/2022    NTPROBNP 16,488.0 (H) 09/16/2022       [employer] ICD in Situ  - Device Interrogation: Presenting rhythm:  VS, irregular, Suggestive of Afib, V rates 60s-90s bpm    HR histogram since 09/02/22 shows V rates >90bpm ~60% of time, > 110bpm ~40%     Atrial fibrillation; last EKG on 6/5 showed sinus tachycardia with PVCs.  - Rates 90's -100's  > Continue Eliquis 5mg  BID   > Toprol as above   > If rate uncontrolled could consider AVN ablation in future          CAD s/p CABG x3 '10, PCI in '10  HLD  Carotid artery disease with prior endarterectomy '16  - PTA Plavix s/p CEA, this was stopped 09/21/22 and started on bASA so intervention by GI could be performed in outpt setting       COPD   OSA with CPAP use  - PTA Proair    Hx diverticulosis s/p sigmoid colon mass resection 08/10/22  Pancreatic cysts  Fat stranding at gastric outlet/proximal duodenum  - Recent surgery with surgical oncology for colonic mass resection, found to be diverticulitis. No signs of malignancy.  - BC on admission Negative   - CT abd/pelv 5/22: RUQ fat abutting the gastric outlet, possibly gastritis or duodenitis. Several cystic lesions in tail of pancreas  - EGD 5/23: normal esophagus, 3cm hiatal hernia, congestive gastropathy, erythematous mucosa biopsied in prepyloric region, 6mm sessile polyp in gastric  body, 2 polyps in duodenum, poylps not removed due to Plavix taken 5/21  - No surgical intervention per surgical oncology   - Completed course of Augmentin 500mg  BID for possible gastritis/duodenitis         CKD stage III:  Lab Results   Component Value Date    CR 1.49 (H) 10/19/2022    CR 1.74 (H) 10/12/2022    CR 1.70 (H) 10/05/2022           Diabetes Mellitus Type II- Managed by Primary team.    Lab Results   Component Value Date/Time    HGBA1C 8.0 (H) 09/16/2022 07:02 PM    HGBA1C 7.4 (H) 07/14/2022 02:44 PM         Lab Results   Component Value Date    CHOL 107 09/16/2022    TRIG 72 09/16/2022    HDL 50 09/16/2022    LDL 45 09/16/2022    VLDL 14 09/16/2022    NONHDLCHOL 57 09/16/2022   .         Follow up appointment: 1 month with me, then see Dr. [name] on 9/9.  Will see her sooner if there is any worsening NT proBNP or abnormalities with the blood cultures    I have personally documented the HPI, exam and medical decision making.  Patient education: I reviewed recent lab results and current medications, medication instructions, discussed heart failure signs & symptoms,  low  sodium diet, fluid restriction and daily weights.I have instructed the patient on the plan of care and they verbalize understanding of the plan. Please  see AVS for full patient teaching. Patient advised to call our office if s/he has any problems, questions, worsening symptoms, or concerns prior to the next appointment.   Thanks for allowing me to see this nice patient. If I can be of additional assistance, please don't hesitate to contact me.     Current Outpatient Medications on File Prior to Visit   Medication Sig Dispense Refill    acetaminophen (TYLENOL) 325 mg tablet Take three tablets by mouth every 8 hours as needed for Pain. Indications: pain 40 tablet 0    acyclovir (ZOVIRAX) 400 mg tablet Take two tablets by mouth every 7 days.      albuterol sulfate (PROAIR HFA) 90 mcg/actuation HFA aerosol inhaler Inhale two puffs by mouth into the lungs every 6 hours as needed for Wheezing or Shortness of Breath.      apixaban (ELIQUIS) 5 mg tablet Take one tablet by mouth twice daily. Indications: treatment to prevent blood clots in chronic atrial fibrillation 180 tablet 0    aspirin 81 mg chewable tablet Chew one tablet by mouth daily. Indications: stroke prevention      cholecalciferol (VITAMIN D) 1,000 units tablet Take one tablet by mouth daily.      cyanocobalamin (VITAMIN B-12) 1,000 mcg tablet Take 5,000 Units by mouth daily.      dexAMETHasone (DECADRON) 4 mg tablet TAKE 5 TABLETS(20 MG) BY MOUTH WEEKLY      dicyclomine (BENTYL) 10 mg capsule Take one capsule by mouth daily.      EMU OIL (BULK) MISC Use  as directed. Apply topically as needed      febuxostat (ULORIC) 40 mg tablet Take one-half tablet by mouth daily. Indications: taken on chemo days 45 tablet 0    ferrous sulfate (FEOSOL) 325 mg (65 mg iron) tablet Take one tablet by mouth daily. Take on an empty stomach at least 1 hour before or 2 hours after food.      insulin aspart (U-100) (NOVOLOG FLEXPEN U-100 INSULIN) 100 unit/mL (3 mL) PEN Inject  under the skin. Per sliding scale ( she doesn't have sheet with her)      insulin detemir U-100 (LEVEMIR FLEXTOUCH) 100 unit/mL (3 mL) injection pen Inject twenty five Units under the skin at bedtime daily.      metOLazone (ZAROXOLYN) 5 mg tablet Take one tablet by mouth as Needed (For weight gain of 3lbs in 24 hrs). Indications: accumulation of fluid resulting from chronic heart failure 30 tablet 0    metoprolol succinate XL (TOPROL XL) 25 mg extended release tablet Take 1.5 tablets by mouth daily. Indications: chronic heart failure, ventricular rate control in atrial fibrillation 90 tablet 1    milrinone (PRIMACOR) 200 mcg/mL infusion Administer 19.575 mcg/min through vein Continuous. Indications: sudden and serious symptoms of heart failure called acute decompensated heart failure      nitroglycerin (NITROSTAT) 0.4 mg tablet Place one tablet under tongue every 5 minutes as needed for Chest Pain.      ONETOUCH VERIO TEST STRIPS test strip USE STRIP TO CHECK GLUCOSE TWICE DAILY FOR DIABETES MELLITUS      pantoprazole DR (PROTONIX) 40 mg tablet Take one tablet by mouth daily. Indications: gastritis 90 tablet 1    potassium chloride SR (K-DUR) 20 mEq tablet Take one tablet by mouth daily. Indications: prevention of low potassium in the blood 90 tablet 1    prochlorperazine maleate (COMPAZINE) 10 mg tablet Take one tablet by mouth every 6 hours as needed.  rosuvastatin (CRESTOR) 10 mg tablet Take one tablet by mouth daily. Indications: hardening of the arteries due to plaque buildup 90 tablet 1    sertraline (ZOLOFT) 50 mg tablet Take one tablet by mouth daily.      torsemide (DEMADEX) 20 mg tablet Take one tablet by mouth daily. Indications: accumulation of fluid resulting from chronic heart failure 180 tablet 1     No current facility-administered medications on file prior to visit.       [name] AGPCNP-BC, CHFN,HF-Cert  Advanced Heart Failure APP  [facility]  Collaborating physician: Dr. Vanetta Shawl      Plan:  The following is a copy of the written instructions and plan I gave to the patient during her office visit.    There are no Patient Instructions on file for this visit.    Future Appointments   Date Time Provider Department Center   10/23/2022  2:30 PM Fredricka Bonine, APRN-NP [id number] Exam   10/28/2022  1:00 PM Jarrett Ables, APRN-NP [email or web address]   01/04/2023  3:30 PM Vanetta Shawl I, MD [facility] C[facility] Exam   03/17/2023  2:40 PM Dahiya, Juliann Pulse [facility]       Total Time Today was 40 minutes in the following activities: Preparing to see the patient, Obtaining and/or reviewing separately obtained history, Performing a medically appropriate examination and/or evaluation, Counseling and educating the patient/family/caregiver, Ordering medications, tests, or procedures, Referring and communication with other health care professionals (when not separately reported), Documenting clinical information in the electronic or other health record, Independently interpreting results (not separately reported) and communicating results to the patient/family/caregiver, and Care coordination (not separately reported)[id number]

## 2022-10-26 ENCOUNTER — Encounter: Admit: 2022-10-26 | Discharge: 2022-10-26 | Payer: MEDICARE

## 2022-10-26 DIAGNOSIS — I5043 Acute on chronic combined systolic (congestive) and diastolic (congestive) heart failure: Secondary | ICD-10-CM

## 2022-10-26 MED ORDER — TORSEMIDE 20 MG PO TAB
20 mg | Freq: Every day | ORAL | 0 refills | 67.50000 days | Status: AC | PRN
Start: 2022-10-26 — End: ?
  Filled 2022-11-09: qty 60, 30d supply, fill #1

## 2022-10-26 NOTE — Telephone Encounter
Spoke with pt, she is agreeable to plan. Lab order faxed to [facility] per pt request.

## 2022-10-27 ENCOUNTER — Encounter: Admit: 2022-10-27 | Discharge: 2022-10-27 | Payer: MEDICARE

## 2022-10-28 ENCOUNTER — Encounter: Admit: 2022-10-28 | Discharge: 2022-10-28 | Payer: MEDICARE

## 2022-10-28 ENCOUNTER — Ambulatory Visit: Admit: 2022-10-28 | Discharge: 2022-10-29 | Payer: MEDICARE

## 2022-10-28 ENCOUNTER — Ambulatory Visit: Admit: 2022-10-28 | Discharge: 2022-10-28 | Payer: MEDICARE

## 2022-10-28 DIAGNOSIS — C55 Malignant neoplasm of uterus, part unspecified: Secondary | ICD-10-CM

## 2022-10-28 DIAGNOSIS — H547 Unspecified visual loss: Secondary | ICD-10-CM

## 2022-10-28 DIAGNOSIS — I519 Heart disease, unspecified: Secondary | ICD-10-CM

## 2022-10-28 DIAGNOSIS — R198 Other specified symptoms and signs involving the digestive system and abdomen: Secondary | ICD-10-CM

## 2022-10-28 DIAGNOSIS — I10 Essential (primary) hypertension: Secondary | ICD-10-CM

## 2022-10-28 DIAGNOSIS — M1990 Unspecified osteoarthritis, unspecified site: Secondary | ICD-10-CM

## 2022-10-28 DIAGNOSIS — K862 Cyst of pancreas: Secondary | ICD-10-CM

## 2022-10-28 DIAGNOSIS — E785 Hyperlipidemia, unspecified: Secondary | ICD-10-CM

## 2022-10-28 DIAGNOSIS — C801 Malignant (primary) neoplasm, unspecified: Secondary | ICD-10-CM

## 2022-10-28 DIAGNOSIS — K317 Polyp of stomach and duodenum: Secondary | ICD-10-CM

## 2022-10-28 DIAGNOSIS — R1900 Intra-abdominal and pelvic swelling, mass and lump, unspecified site: Secondary | ICD-10-CM

## 2022-10-28 DIAGNOSIS — F419 Anxiety disorder, unspecified: Secondary | ICD-10-CM

## 2022-10-28 DIAGNOSIS — K219 Gastro-esophageal reflux disease without esophagitis: Secondary | ICD-10-CM

## 2022-10-28 DIAGNOSIS — G4730 Sleep apnea, unspecified: Secondary | ICD-10-CM

## 2022-10-28 DIAGNOSIS — I2510 Atherosclerotic heart disease of native coronary artery without angina pectoris: Secondary | ICD-10-CM

## 2022-10-28 DIAGNOSIS — M549 Dorsalgia, unspecified: Secondary | ICD-10-CM

## 2022-10-28 DIAGNOSIS — I6521 Occlusion and stenosis of right carotid artery: Secondary | ICD-10-CM

## 2022-10-28 DIAGNOSIS — J45909 Unspecified asthma, uncomplicated: Secondary | ICD-10-CM

## 2022-10-28 DIAGNOSIS — E119 Type 2 diabetes mellitus without complications: Secondary | ICD-10-CM

## 2022-10-28 DIAGNOSIS — J449 Chronic obstructive pulmonary disease, unspecified: Secondary | ICD-10-CM

## 2022-10-28 DIAGNOSIS — R609 Edema, unspecified: Secondary | ICD-10-CM

## 2022-10-28 NOTE — Progress Notes
Date of Service: 10/28/2022    Subjective:             Raven [name] is a 77 y.o. female.    History of Present Illness    Raven [name] is a pleasant 77 y.o. female patient of Dr. 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     She was admitted to [facility] for acute on chronic heart failure exacerbation. She reported epigastric abdominal pain, nausea and vomiting. CT obtained in the ED was notable for fat stranding in the RUQ abutting the gastric outlet and duodenal bulb for which GI was consulted. She had EGD 09/17/2022 that showed hiatal hernia, no evidence of obstruction; 1 gastric polyp and 2 duodenal polyps that were not removed due to recent Plavix dose, and erythematous mucosa in the stomach (biopsied); pathology revealed mild inflammation, no H pylori.     She presents today to establish care in the GI clinic and is accompanied by her daughter and daughter-in-law. She reports that she has been feeling well since discharge. She denies abdominal pain. She also reports that nausea and vomiting has improved. She remains on pantoprazole 40mg  daily for acid reflux and this works well. She denies breakthrough symptoms. She denies specific GI concerns. She does report that her stools can fluctuate between constipation and diarrhea.     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 She was unaware that these cysts were present, although CT report mentions they were visualized previously in 2020 and 2023.  She reports that her mom had pancreatic cancer and was diagnosed in her 10s. We also discussed gastric and duodenal polyps that were not removed during inpatient EGD due to patient being on Plavix. She is still taking Plavix and this would need to be held prior to polypectomy.                      Objective:         acetaminophen (TYLENOL) 325 mg tablet Take three tablets by mouth every 8 hours as needed for Pain. Indications: pain    acyclovir (ZOVIRAX) 400 mg tablet Take two tablets by mouth every 7 days.    albuterol sulfate (PROAIR HFA) 90 mcg/actuation HFA aerosol inhaler Inhale two puffs by mouth into the lungs every 6 hours as needed for Wheezing or Shortness of Breath.    apixaban (ELIQUIS) 5 mg tablet Take one tablet by mouth twice daily. Indications: treatment to prevent blood clots in chronic atrial fibrillation    aspirin 81 mg chewable tablet Chew one tablet by mouth daily. Indications: stroke prevention    cholecalciferol (VITAMIN D) 1,000 units tablet Take one tablet by mouth daily.    cyanocobalamin (VITAMIN B-12) 1,000 mcg tablet Take 5,000 Units by mouth daily.    dexAMETHasone (DECADRON) 4 mg tablet TAKE 5 TABLETS(20 MG) BY MOUTH WEEKLY    dicyclomine (BENTYL) 10 mg capsule Take one capsule by mouth daily.    EMU OIL (BULK) MISC Use  as directed. Apply topically as needed    febuxostat (ULORIC) 40 mg tablet Take one-half tablet by mouth daily. Indications: taken on chemo days    ferrous sulfate (FEOSOL) 325 mg (65 mg iron) tablet Take one tablet by mouth daily. Take on an empty stomach at least 1 hour  before or 2 hours after food.    insulin aspart (U-100) (NOVOLOG FLEXPEN U-100 INSULIN) 100 unit/mL (3 mL) PEN Inject  under the skin. Per sliding scale ( she doesn't have sheet with her)    insulin detemir U-100 (LEVEMIR FLEXTOUCH) 100 unit/mL (3 mL) injection pen Inject twenty five Units under the skin at bedtime daily.    metoprolol succinate XL (TOPROL XL) 25 mg extended release tablet Take 1.5 tablets by mouth daily. Indications: chronic heart failure, ventricular rate control in atrial fibrillation    milrinone (PRIMACOR) 200 mcg/mL infusion Administer 19.575 mcg/min through vein Continuous. Indications: sudden and serious symptoms of heart failure called acute decompensated heart failure    nitroglycerin (NITROSTAT) 0.4 mg tablet Place one tablet under tongue every 5 minutes as needed for Chest Pain.    ONETOUCH VERIO TEST STRIPS test strip USE STRIP TO CHECK GLUCOSE TWICE DAILY FOR DIABETES MELLITUS    pantoprazole DR (PROTONIX) 40 mg tablet Take one tablet by mouth daily. Indications: gastritis    potassium chloride SR (K-DUR) 20 mEq tablet Take one tablet by mouth daily. Indications: prevention of low potassium in the blood    prochlorperazine maleate (COMPAZINE) 10 mg tablet Take one tablet by mouth every 6 hours as needed.    rosuvastatin (CRESTOR) 10 mg tablet Take one tablet by mouth daily. Indications: hardening of the arteries due to plaque buildup    sertraline (ZOLOFT) 50 mg tablet Take one tablet by mouth daily.    torsemide (DEMADEX) 20 mg tablet Take one tablet by mouth daily as needed. Indications: accumulation of fluid resulting from chronic heart failure     Vitals:    10/28/22 1257   BP: 117/74   Pulse: 109   Temp: 36.4 ?C (97.6 ?F)   SpO2: 100%   TempSrc: Skin   PainSc: Zero   Weight: 74.8 kg (165 lb)   Height: 157.5 cm (5' 2)     Body mass index is 30.18 kg/m?Marland Kitchen     Physical Exam  Constitutional:       Appearance: Normal appearance. She is not ill-appearing.   Neurological:      Mental Status: She is alert.   Psychiatric:         Attention and Perception: Attention normal.         Mood and Affect: Mood normal.         Speech: Speech normal.         Behavior: Behavior normal. Behavior is cooperative.              Assessment and Plan:      1. Pancreatic cyst  - EGD/EUS with polypectomy with Dr. Keenan Bachelor, dx: pancreatic cyst, gastric polyp, duodenal polyp, Main, PAC appointment first. Will need to hold Plavix    2. Gastric polyp  - EGD with polypectomy as above    3. Polyp of duodenum  - EGD with polypectomy as above    4. Alternating constipation and diarrhea  - Can begin fiber supplement if alternating constipation and diarrhea are bothersome      5. Gastroesophageal reflux disease without esophagitis  - Continue pantoprazole 40mg  daily                       RTC 6 months, in person with Adelina Mings, APRN             Total Time Today was 60 minutes in the following activities: Preparing to see the patient, Obtaining and/or reviewing  separately obtained history, Performing a medically appropriate examination and/or evaluation, Counseling and educating the patient/family/caregiver, Ordering medications, tests, or procedures, Referring and communication with other health care professionals (when not separately reported), Documenting clinical information in the electronic or other health record, and Care coordination (not separately reported)

## 2022-10-30 ENCOUNTER — Encounter: Admit: 2022-10-30 | Discharge: 2022-10-30 | Payer: MEDICARE

## 2022-10-30 DIAGNOSIS — I5043 Acute on chronic combined systolic (congestive) and diastolic (congestive) heart failure: Secondary | ICD-10-CM

## 2022-10-30 NOTE — Telephone Encounter
Spoke with pt, she was appreciative on update from labs. Pt also confirms she would like her device to be managed at [facility].

## 2022-11-01 ENCOUNTER — Encounter: Admit: 2022-11-01 | Discharge: 2022-11-01 | Payer: MEDICARE

## 2022-11-01 ENCOUNTER — Emergency Department: Admit: 2022-11-01 | Discharge: 2022-11-01 | Payer: MEDICARE

## 2022-11-01 ENCOUNTER — Inpatient Hospital Stay: Admit: 2022-11-01 | Payer: MEDICARE

## 2022-11-01 DIAGNOSIS — I5023 Acute on chronic systolic (congestive) heart failure: Secondary | ICD-10-CM

## 2022-11-01 DIAGNOSIS — I5043 Acute on chronic combined systolic (congestive) and diastolic (congestive) heart failure: Secondary | ICD-10-CM

## 2022-11-01 DIAGNOSIS — E8779 Other fluid overload: Secondary | ICD-10-CM

## 2022-11-01 LAB — CBC AND DIFF
ABSOLUTE NEUTROPHIL COUNT MANUAL: 12 K/UL — ABNORMAL HIGH (ref 1.8–7.0)
MDW (MONOCYTE DISTRIBUTION WIDTH): 15 (ref <20.7)
WBC COUNT: 14 K/UL — ABNORMAL HIGH (ref 4.5–11.0)

## 2022-11-01 LAB — HIGH SENSITIVITY TROPONIN I 0 HOUR: HIGH SENSITIVITY TROPONIN I 0 HOUR: 31 ng/L — ABNORMAL HIGH (ref <12)

## 2022-11-01 LAB — INFLUENZA A/B AND RSV PCR
FLU A: NEGATIVE
FLU B: NEGATIVE
RSV: NEGATIVE

## 2022-11-01 LAB — COMPREHENSIVE METABOLIC PANEL
ALBUMIN: 3.3 g/dL — ABNORMAL LOW (ref 3.5–5.0)
ALK PHOSPHATASE: 64 U/L — ABNORMAL HIGH (ref 25–110)
ALT: 16 U/L (ref 7–56)
ANION GAP: 13 % — ABNORMAL HIGH (ref 3–12)
AST: 14 U/L (ref 7–40)
BLD UREA NITROGEN: 70 mg/dL — ABNORMAL HIGH (ref 7–25)
CALCIUM: 8.4 mg/dL — ABNORMAL LOW (ref 8.5–10.6)
CHLORIDE: 113 MMOL/L — ABNORMAL HIGH (ref 98–110)
CO2: 16 MMOL/L — ABNORMAL LOW (ref 21–30)
CREATININE: 1.8 mg/dL — ABNORMAL HIGH (ref 0.4–1.00)
EGFR: 28 mL/min — ABNORMAL LOW (ref >60)
GLUCOSE,PANEL: 164 mg/dL — ABNORMAL HIGH (ref 70–100)
SODIUM: 142 MMOL/L — ABNORMAL LOW (ref 137–147)
TOTAL BILIRUBIN: 0.6 mg/dL (ref 0.2–1.3)
TOTAL PROTEIN: 5.4 g/dL — ABNORMAL LOW (ref 6.0–8.0)

## 2022-11-01 LAB — HIGH SENSITIVITY TROPONIN I 2 HOUR: HIGH SENSITIVITY TROPONIN I 2 HOUR: 37 ng/L — ABNORMAL HIGH (ref <12)

## 2022-11-01 LAB — HIGH SENSITIVITY TROPONIN I 4 HR
HI SEN TNI 4 HR: 38 ng/L — ABNORMAL HIGH (ref <12)
HI SEN TNI DELTA 4-2: 1

## 2022-11-01 LAB — COVID-19 (SARS-COV-2) PCR

## 2022-11-01 LAB — MAGNESIUM: MAGNESIUM: 2 mg/dL — ABNORMAL LOW (ref 1.6–2.6)

## 2022-11-01 LAB — D-DIMER: D-DIMER: 575 ng{FEU}/mL — ABNORMAL HIGH (ref <500)

## 2022-11-01 LAB — LACTIC ACID(LACTATE): LACTIC ACID: 1.5 MMOL/L (ref 0.5–2.0)

## 2022-11-01 LAB — BETA HYDROXYBUTYRATE (KETONES): BETA HYDROXYBUTYRATE: 0.2 MMOL/L (ref <0.3)

## 2022-11-01 MED ORDER — SERTRALINE 50 MG PO TAB
50 mg | Freq: Every day | ORAL | 0 refills
Start: 2022-11-01 — End: ?
  Administered 2022-11-02 – 2022-11-10 (×9): 50 mg via ORAL

## 2022-11-01 MED ORDER — POLYETHYLENE GLYCOL 3350 17 GRAM PO PWPK
1 | Freq: Every day | ORAL | 0 refills | PRN
Start: 2022-11-01 — End: ?
  Administered 2022-11-05: 02:00:00 17 g via ORAL

## 2022-11-01 MED ORDER — PANTOPRAZOLE 40 MG PO TBEC
40 mg | Freq: Every day | ORAL | 0 refills
Start: 2022-11-01 — End: ?
  Administered 2022-11-02 – 2022-11-10 (×9): 40 mg via ORAL

## 2022-11-01 MED ORDER — METOPROLOL SUCCINATE 25 MG PO TB24
37.5 mg | Freq: Every day | ORAL | 0 refills
Start: 2022-11-01 — End: ?
  Administered 2022-11-02 – 2022-11-10 (×9): 37.5 mg via ORAL

## 2022-11-01 MED ORDER — MILRINONE IN 5 % DEXTROSE 20 MG/100 ML (200 MCG/ML) IV PGBK
.25 ug/kg/min | INTRAVENOUS | 0 refills
Start: 2022-11-01 — End: ?
  Administered 2022-11-02: 06:00:00 0.25 ug/kg/min via INTRAVENOUS
  Administered 2022-11-02 – 2022-11-10 (×15): 0.3 ug/kg/min via INTRAVENOUS

## 2022-11-01 MED ORDER — DICYCLOMINE 10 MG PO CAP
10 mg | Freq: Every day | ORAL | 0 refills
Start: 2022-11-01 — End: ?
  Administered 2022-11-02 – 2022-11-10 (×9): 10 mg via ORAL

## 2022-11-01 MED ORDER — ROSUVASTATIN 20 MG PO TAB
10 mg | Freq: Every day | ORAL | 0 refills
Start: 2022-11-01 — End: ?
  Administered 2022-11-02 – 2022-11-10 (×9): 10 mg via ORAL

## 2022-11-01 MED ORDER — INSULIN GLARGINE 100 UNIT/ML (3 ML) SC INJ PEN
25 [IU] | Freq: Every evening | SUBCUTANEOUS | 0 refills
Start: 2022-11-01 — End: ?

## 2022-11-01 MED ORDER — ACETAMINOPHEN 500 MG PO TAB
1000 mg | ORAL | 0 refills | PRN
Start: 2022-11-01 — End: ?
  Administered 2022-11-06: 11:00:00 1000 mg via ORAL

## 2022-11-01 MED ORDER — SENNOSIDES-DOCUSATE SODIUM 8.6-50 MG PO TAB
1 | Freq: Every day | ORAL | 0 refills | PRN
Start: 2022-11-01 — End: ?
  Administered 2022-11-05: 02:00:00 1 via ORAL

## 2022-11-01 MED ORDER — HELP MEDICATION
Freq: Every day | 0 refills
Start: 2022-11-01 — End: ?

## 2022-11-01 MED ORDER — ONDANSETRON HCL (PF) 4 MG/2 ML IJ SOLN
4 mg | INTRAVENOUS | 0 refills | PRN
Start: 2022-11-01 — End: ?
  Administered 2022-11-04: 14:00:00 4 mg via INTRAVENOUS

## 2022-11-01 MED ORDER — FUROSEMIDE 10 MG/ML IJ SOLN
40 mg | Freq: Once | INTRAVENOUS | 0 refills | Status: CP
Start: 2022-11-01 — End: ?
  Administered 2022-11-02: 03:00:00 40 mg via INTRAVENOUS

## 2022-11-01 MED ORDER — MELATONIN 5 MG PO TAB
5 mg | Freq: Every evening | ORAL | 0 refills | PRN
Start: 2022-11-01 — End: ?
  Administered 2022-11-02 – 2022-11-10 (×6): 5 mg via ORAL

## 2022-11-01 MED ORDER — APIXABAN 5 MG PO TAB
5 mg | Freq: Two times a day (BID) | ORAL | 0 refills
Start: 2022-11-01 — End: ?
  Administered 2022-11-02 – 2022-11-09 (×13): 5 mg via ORAL

## 2022-11-01 MED ORDER — ONDANSETRON 4 MG PO TBDI
4 mg | ORAL | 0 refills | PRN
Start: 2022-11-01 — End: ?
  Administered 2022-11-10: 14:00:00 4 mg via ORAL

## 2022-11-01 MED ORDER — ASPIRIN 81 MG PO CHEW
81 mg | Freq: Every day | ORAL | 0 refills
Start: 2022-11-01 — End: ?
  Administered 2022-11-02 – 2022-11-10 (×9): 81 mg via ORAL

## 2022-11-02 ENCOUNTER — Encounter: Admit: 2022-11-02 | Discharge: 2022-11-02 | Payer: MEDICARE

## 2022-11-02 ENCOUNTER — Inpatient Hospital Stay: Admit: 2022-11-02 | Discharge: 2022-11-02 | Payer: MEDICARE

## 2022-11-02 MED ADMIN — FUROSEMIDE 10 MG/ML IJ SOLN [3291]: 40 mg | INTRAVENOUS | @ 15:00:00 | Stop: 2022-11-02 | NDC 71288020304

## 2022-11-02 MED ADMIN — FUROSEMIDE 10 MG/ML IJ SOLN [3291]: 40 mg | INTRAVENOUS | @ 21:00:00 | NDC 71288020304

## 2022-11-02 MED ADMIN — BUDESONIDE-FORMOTEROL 160-4.5 MCG/ACTUATION IN HFAA [163006]: 2 | RESPIRATORY_TRACT | @ 14:00:00 | NDC 00186037028

## 2022-11-02 MED ADMIN — INSULIN GLARGINE 100 UNIT/ML (3 ML) SC INJ PEN [163596]: 25 [IU] | SUBCUTANEOUS | @ 06:00:00 | NDC 00088221905

## 2022-11-02 NOTE — ED Notes
Patient transported to medical floor via cart by transport staff. Patient has all belongings. No signs of acute distress. Transport took motorized scooter with patient at this time.

## 2022-11-03 ENCOUNTER — Encounter: Admit: 2022-11-03 | Discharge: 2022-11-03 | Payer: MEDICARE

## 2022-11-03 MED ADMIN — FUROSEMIDE 10 MG/ML IJ SOLN [3291]: 40 mg | INTRAVENOUS | @ 14:00:00 | NDC 71288020304

## 2022-11-03 MED ADMIN — SODIUM CHLORIDE 0.9 % IV SOLP [27838]: 250 mL | INTRAVENOUS | @ 10:00:00 | Stop: 2022-11-03 | NDC 00338004902

## 2022-11-03 MED ADMIN — BENZONATATE 100 MG PO CAP [988]: 100 mg | ORAL | @ 03:00:00 | NDC 00904715361

## 2022-11-03 MED ADMIN — POTASSIUM CHLORIDE 20 MEQ PO TBTQ [35943]: 40 meq | ORAL | @ 22:00:00 | Stop: 2022-11-03 | NDC 00832532510

## 2022-11-03 MED ADMIN — MAGNESIUM SULFATE IN D5W 1 GRAM/100 ML IV PGBK [166578]: 1 g | INTRAVENOUS | @ 15:00:00 | Stop: 2022-11-03 | NDC 00264440054

## 2022-11-03 MED ADMIN — POTASSIUM CHLORIDE 20 MEQ PO TBTQ [35943]: 40 meq | ORAL | @ 10:00:00 | Stop: 2022-11-03 | NDC 00832532510

## 2022-11-03 MED ADMIN — MAGNESIUM SULFATE IN D5W 1 GRAM/100 ML IV PGBK [166578]: 1 g | INTRAVENOUS | @ 10:00:00 | Stop: 2022-11-03 | NDC 00264440054

## 2022-11-03 MED ADMIN — MAGNESIUM SULFATE IN D5W 1 GRAM/100 ML IV PGBK [166578]: 1 g | INTRAVENOUS | @ 19:00:00 | Stop: 2022-11-03 | NDC 00264440054

## 2022-11-03 MED ADMIN — INSULIN ASPART 100 UNIT/ML SC FLEXPEN [87504]: 1 [IU] | SUBCUTANEOUS | @ 02:00:00 | NDC 00169633910

## 2022-11-03 MED ADMIN — FUROSEMIDE 10 MG/ML IJ SOLN [3291]: 40 mg | INTRAVENOUS | @ 22:00:00 | NDC 71288020304

## 2022-11-04 ENCOUNTER — Encounter: Admit: 2022-11-04 | Discharge: 2022-11-04 | Payer: MEDICARE

## 2022-11-04 ENCOUNTER — Inpatient Hospital Stay: Admit: 2022-11-04 | Discharge: 2022-11-04 | Payer: MEDICARE

## 2022-11-04 DIAGNOSIS — J449 Chronic obstructive pulmonary disease, unspecified: Secondary | ICD-10-CM

## 2022-11-04 DIAGNOSIS — I10 Essential (primary) hypertension: Secondary | ICD-10-CM

## 2022-11-04 DIAGNOSIS — C55 Malignant neoplasm of uterus, part unspecified: Secondary | ICD-10-CM

## 2022-11-04 DIAGNOSIS — H547 Unspecified visual loss: Secondary | ICD-10-CM

## 2022-11-04 DIAGNOSIS — I2510 Atherosclerotic heart disease of native coronary artery without angina pectoris: Secondary | ICD-10-CM

## 2022-11-04 DIAGNOSIS — J45909 Unspecified asthma, uncomplicated: Secondary | ICD-10-CM

## 2022-11-04 DIAGNOSIS — F419 Anxiety disorder, unspecified: Secondary | ICD-10-CM

## 2022-11-04 DIAGNOSIS — R1900 Intra-abdominal and pelvic swelling, mass and lump, unspecified site: Secondary | ICD-10-CM

## 2022-11-04 DIAGNOSIS — C801 Malignant (primary) neoplasm, unspecified: Secondary | ICD-10-CM

## 2022-11-04 DIAGNOSIS — M1990 Unspecified osteoarthritis, unspecified site: Secondary | ICD-10-CM

## 2022-11-04 DIAGNOSIS — I519 Heart disease, unspecified: Secondary | ICD-10-CM

## 2022-11-04 DIAGNOSIS — I6521 Occlusion and stenosis of right carotid artery: Secondary | ICD-10-CM

## 2022-11-04 DIAGNOSIS — E119 Type 2 diabetes mellitus without complications: Secondary | ICD-10-CM

## 2022-11-04 DIAGNOSIS — G4730 Sleep apnea, unspecified: Secondary | ICD-10-CM

## 2022-11-04 DIAGNOSIS — M549 Dorsalgia, unspecified: Secondary | ICD-10-CM

## 2022-11-04 DIAGNOSIS — R609 Edema, unspecified: Secondary | ICD-10-CM

## 2022-11-04 DIAGNOSIS — E785 Hyperlipidemia, unspecified: Secondary | ICD-10-CM

## 2022-11-04 MED ADMIN — SODIUM CHLORIDE 0.9 % IV SOLP [27838]: 500 mL | @ 16:00:00 | Stop: 2022-11-04 | NDC 00338004904

## 2022-11-04 MED ADMIN — BENZONATATE 100 MG PO CAP [988]: 100 mg | ORAL | @ 01:00:00 | NDC 00904715361

## 2022-11-04 MED ADMIN — FUROSEMIDE 10 MG/ML IJ SOLN [3291]: 40 mg | INTRAVENOUS | @ 14:00:00 | Stop: 2022-11-04 | NDC 71288020304

## 2022-11-04 MED ADMIN — POTASSIUM CHLORIDE 20 MEQ PO TBTQ [35943]: 40 meq | ORAL | @ 14:00:00 | Stop: 2022-11-04 | NDC 00832532510

## 2022-11-04 MED ADMIN — AMIODARONE 200 MG PO TAB [9066]: 400 mg | ORAL | @ 20:00:00 | Stop: 2022-11-12 | NDC 00904699361

## 2022-11-04 MED ADMIN — CEFAZOLIN 1 GRAM IJ SOLR [1445]: 500 mL | @ 16:00:00 | Stop: 2022-11-04 | NDC 00143992490

## 2022-11-04 MED ADMIN — ACYCLOVIR 400 MG PO TAB [8971]: 400 mg | ORAL | @ 14:00:00 | NDC 00904579061

## 2022-11-04 MED ADMIN — SODIUM CHLORIDE 0.9 % IV SOLP [27838]: 1000.000 mL | INTRAVENOUS | @ 14:00:00 | Stop: 2022-11-05 | NDC 00338004904

## 2022-11-04 MED ADMIN — ACYCLOVIR 400 MG PO TAB [8971]: 400 mg | ORAL | @ 02:00:00 | NDC 00904579061

## 2022-11-05 ENCOUNTER — Inpatient Hospital Stay: Admit: 2022-11-05 | Discharge: 2022-11-05 | Payer: MEDICARE

## 2022-11-05 MED ADMIN — DEXAMETHASONE 6 MG PO TAB [2328]: 20 mg | ORAL | @ 16:00:00 | NDC 00054418625

## 2022-11-05 MED ADMIN — AMIODARONE 200 MG PO TAB [9066]: 400 mg | ORAL | @ 02:00:00 | Stop: 2022-11-12 | NDC 00904699361

## 2022-11-05 MED ADMIN — ACYCLOVIR 400 MG PO TAB [8971]: 400 mg | ORAL | @ 16:00:00 | NDC 00904579061

## 2022-11-05 MED ADMIN — AMIODARONE 200 MG PO TAB [9066]: 400 mg | ORAL | @ 16:00:00 | Stop: 2022-11-12 | NDC 00904699361

## 2022-11-05 MED ADMIN — ACYCLOVIR 400 MG PO TAB [8971]: 400 mg | ORAL | @ 02:00:00 | NDC 00904579061

## 2022-11-05 MED ADMIN — TORSEMIDE 20 MG PO TAB [18293]: 20 mg | ORAL | @ 19:00:00 | Stop: 2022-11-05 | NDC 68084053911

## 2022-11-05 MED ADMIN — TORSEMIDE 20 MG PO TAB [18293]: 20 mg | ORAL | @ 16:00:00 | Stop: 2022-11-05 | NDC 68084053911

## 2022-11-05 MED ADMIN — POLYETHYLENE GLYCOL 3350 17 GRAM PO PWPK [25424]: 17 g | ORAL | @ 16:00:00 | NDC 00904693186

## 2022-11-05 MED ADMIN — DEXAMETHASONE 2 MG PO TAB [2326]: 20 mg | ORAL | @ 16:00:00 | NDC 00054817625

## 2022-11-06 MED ADMIN — ACYCLOVIR 400 MG PO TAB [8971]: 400 mg | ORAL | @ 13:00:00 | NDC 00904579061

## 2022-11-06 MED ADMIN — POLYETHYLENE GLYCOL 3350 17 GRAM PO PWPK [25424]: 17 g | ORAL | @ 02:00:00 | NDC 00904693186

## 2022-11-06 MED ADMIN — TORSEMIDE 20 MG PO TAB [18293]: 40 mg | ORAL | @ 13:00:00 | NDC 68084053911

## 2022-11-06 MED ADMIN — INSULIN ASPART 100 UNIT/ML SC FLEXPEN [87504]: 1 [IU] | SUBCUTANEOUS | @ 13:00:00 | NDC 00169633910

## 2022-11-06 MED ADMIN — AMIODARONE 200 MG PO TAB [9066]: 400 mg | ORAL | @ 13:00:00 | Stop: 2022-11-12 | NDC 00904699361

## 2022-11-06 MED ADMIN — HYDROXYZINE HCL 25 MG PO TAB [3774]: 50 mg | ORAL | @ 07:00:00 | Stop: 2022-11-06 | NDC 00904661761

## 2022-11-06 MED ADMIN — ACYCLOVIR 400 MG PO TAB [8971]: 400 mg | ORAL | @ 02:00:00 | NDC 50268006111

## 2022-11-06 MED ADMIN — AMIODARONE 200 MG PO TAB [9066]: 400 mg | ORAL | @ 02:00:00 | Stop: 2022-11-12 | NDC 00904699361

## 2022-11-06 MED ADMIN — POLYETHYLENE GLYCOL 3350 17 GRAM PO PWPK [25424]: 17 g | ORAL | @ 13:00:00 | NDC 00904693186

## 2022-11-07 MED ADMIN — TORSEMIDE 20 MG PO TAB [18293]: 40 mg | ORAL | @ 14:00:00 | NDC 68084053911

## 2022-11-07 MED ADMIN — ACYCLOVIR 400 MG PO TAB [8971]: 400 mg | ORAL | @ 14:00:00 | NDC 00904579061

## 2022-11-07 MED ADMIN — AMIODARONE 200 MG PO TAB [9066]: 400 mg | ORAL | @ 14:00:00 | Stop: 2022-11-12 | NDC 00904699361

## 2022-11-07 MED ADMIN — AMIODARONE 200 MG PO TAB [9066]: 400 mg | ORAL | @ 03:00:00 | Stop: 2022-11-12 | NDC 00904699361

## 2022-11-07 MED ADMIN — ACYCLOVIR 400 MG PO TAB [8971]: 400 mg | ORAL | @ 03:00:00 | NDC 50268006111

## 2022-11-07 MED ADMIN — INSULIN GLARGINE 100 UNIT/ML (3 ML) SC INJ PEN [163596]: 25 [IU] | SUBCUTANEOUS | @ 03:00:00 | NDC 00088221905

## 2022-11-08 MED ADMIN — TORSEMIDE 20 MG PO TAB [18293]: 40 mg | ORAL | @ 13:00:00 | NDC 68084053911

## 2022-11-08 MED ADMIN — ACYCLOVIR 400 MG PO TAB [8971]: 400 mg | ORAL | @ 13:00:00 | NDC 50268006111

## 2022-11-08 MED ADMIN — ACYCLOVIR 400 MG PO TAB [8971]: 400 mg | ORAL | @ 03:00:00 | NDC 50268006111

## 2022-11-08 MED ADMIN — AMIODARONE 200 MG PO TAB [9066]: 400 mg | ORAL | @ 13:00:00 | Stop: 2022-11-12 | NDC 00904699361

## 2022-11-08 MED ADMIN — AMIODARONE 200 MG PO TAB [9066]: 400 mg | ORAL | @ 03:00:00 | Stop: 2022-11-12 | NDC 00904699361

## 2022-11-09 ENCOUNTER — Encounter: Admit: 2022-11-09 | Discharge: 2022-11-09 | Payer: MEDICARE

## 2022-11-09 ENCOUNTER — Inpatient Hospital Stay: Admit: 2022-11-09 | Discharge: 2022-11-09 | Payer: MEDICARE

## 2022-11-09 MED ADMIN — ACYCLOVIR 400 MG PO TAB [8971]: 400 mg | ORAL | @ 14:00:00 | NDC 50268006111

## 2022-11-09 MED ADMIN — POLYETHYLENE GLYCOL 3350 17 GRAM PO PWPK [25424]: 17 g | ORAL | @ 03:00:00 | NDC 00904693186

## 2022-11-09 MED ADMIN — POTASSIUM CHLORIDE 20 MEQ PO TBTQ [35943]: 20 meq | ORAL | @ 17:00:00 | Stop: 2022-11-09 | NDC 00832532510

## 2022-11-09 MED ADMIN — AMIODARONE 200 MG PO TAB [9066]: 400 mg | ORAL | @ 14:00:00 | Stop: 2022-11-12 | NDC 63739005110

## 2022-11-09 MED ADMIN — POTASSIUM CHLORIDE 20 MEQ PO TBTQ [35943]: 60 meq | ORAL | @ 11:00:00 | Stop: 2022-11-09 | NDC 00832532510

## 2022-11-09 MED ADMIN — AMIODARONE 200 MG PO TAB [9066]: 400 mg | ORAL | @ 02:00:00 | Stop: 2022-11-12 | NDC 63739005110

## 2022-11-09 MED ADMIN — ACYCLOVIR 400 MG PO TAB [8971]: 400 mg | ORAL | @ 02:00:00 | NDC 50268006111

## 2022-11-09 MED ADMIN — TORSEMIDE 20 MG PO TAB [18293]: 40 mg | ORAL | @ 14:00:00 | NDC 68084053911

## 2022-11-09 MED FILL — AMIODARONE 200 MG PO TAB: 200 mg | ORAL | 34 days supply | Qty: 40 | Fill #1 | Status: CP

## 2022-11-09 MED FILL — BUDESONIDE-FORMOTEROL 160-4.5 MCG/ACTUATION IN HFAA: RESPIRATORY_TRACT | 30 days supply | Qty: 10.2 | Fill #1 | Status: CP

## 2022-11-10 ENCOUNTER — Inpatient Hospital Stay: Admit: 2022-11-10 | Discharge: 2022-11-10 | Payer: MEDICARE

## 2022-11-10 ENCOUNTER — Encounter: Admit: 2022-11-10 | Discharge: 2022-11-10 | Payer: MEDICARE

## 2022-11-10 DIAGNOSIS — I6521 Occlusion and stenosis of right carotid artery: Secondary | ICD-10-CM

## 2022-11-10 DIAGNOSIS — I519 Heart disease, unspecified: Secondary | ICD-10-CM

## 2022-11-10 DIAGNOSIS — H547 Unspecified visual loss: Secondary | ICD-10-CM

## 2022-11-10 DIAGNOSIS — E119 Type 2 diabetes mellitus without complications: Secondary | ICD-10-CM

## 2022-11-10 DIAGNOSIS — M1990 Unspecified osteoarthritis, unspecified site: Secondary | ICD-10-CM

## 2022-11-10 DIAGNOSIS — M549 Dorsalgia, unspecified: Secondary | ICD-10-CM

## 2022-11-10 DIAGNOSIS — G4730 Sleep apnea, unspecified: Secondary | ICD-10-CM

## 2022-11-10 DIAGNOSIS — J45909 Unspecified asthma, uncomplicated: Secondary | ICD-10-CM

## 2022-11-10 DIAGNOSIS — R609 Edema, unspecified: Secondary | ICD-10-CM

## 2022-11-10 DIAGNOSIS — F419 Anxiety disorder, unspecified: Secondary | ICD-10-CM

## 2022-11-10 DIAGNOSIS — I2510 Atherosclerotic heart disease of native coronary artery without angina pectoris: Secondary | ICD-10-CM

## 2022-11-10 DIAGNOSIS — E785 Hyperlipidemia, unspecified: Secondary | ICD-10-CM

## 2022-11-10 DIAGNOSIS — C801 Malignant (primary) neoplasm, unspecified: Secondary | ICD-10-CM

## 2022-11-10 DIAGNOSIS — J449 Chronic obstructive pulmonary disease, unspecified: Secondary | ICD-10-CM

## 2022-11-10 DIAGNOSIS — C55 Malignant neoplasm of uterus, part unspecified: Secondary | ICD-10-CM

## 2022-11-10 DIAGNOSIS — I10 Essential (primary) hypertension: Secondary | ICD-10-CM

## 2022-11-10 DIAGNOSIS — R1900 Intra-abdominal and pelvic swelling, mass and lump, unspecified site: Secondary | ICD-10-CM

## 2022-11-10 MED ORDER — PROPOFOL 10 MG/ML IV EMUL 20 ML (INFUSION)(AM)(OR)
INTRAVENOUS | 0 refills | Status: DC
Start: 2022-11-10 — End: 2022-11-10
  Administered 2022-11-10: 18:00:00 20 mg via INTRAVENOUS

## 2022-11-10 MED ORDER — SODIUM CHLORIDE 0.9 % IV SOLP
INTRAVENOUS | 0 refills | Status: DC
Start: 2022-11-10 — End: 2022-11-10

## 2022-11-10 MED ADMIN — ACYCLOVIR 400 MG PO TAB [8971]: 400 mg | ORAL | @ 02:00:00 | NDC 50268006111

## 2022-11-10 MED ADMIN — AMIODARONE 200 MG PO TAB [9066]: 400 mg | ORAL | @ 02:00:00 | Stop: 2022-11-12 | NDC 63739005110

## 2022-11-10 MED ADMIN — TORSEMIDE 20 MG PO TAB [18293]: 40 mg | ORAL | @ 14:00:00 | Stop: 2022-11-10 | NDC 68084053911

## 2022-11-10 MED ADMIN — AMIODARONE 200 MG PO TAB [9066]: 400 mg | ORAL | @ 14:00:00 | Stop: 2022-11-10 | NDC 63739005110

## 2022-11-10 MED ADMIN — DEXTROSE 50 % IN WATER (D50W) IV SYRG [2365]: 25 mL | INTRAVENOUS | @ 13:00:00 | Stop: 2022-11-10 | NDC 70092147550

## 2022-11-10 MED ADMIN — ACYCLOVIR 400 MG PO TAB [8971]: 400 mg | ORAL | @ 14:00:00 | Stop: 2022-11-10 | NDC 50268006111

## 2022-11-10 MED ADMIN — DEXTROSE 50 % IN WATER (D50W) IV SYRG [2365]: 25 mL | INTRAVENOUS | @ 16:00:00 | Stop: 2022-11-10 | NDC 70092147550

## 2022-11-10 MED ADMIN — POTASSIUM CHLORIDE 20 MEQ PO TBTQ [35943]: 40 meq | ORAL | @ 14:00:00 | Stop: 2022-11-10 | NDC 00832532510

## 2022-11-10 MED ADMIN — DEXTROSE 50 % IN WATER (D50W) IV SYRG [2365]: 25 mL | INTRAVENOUS | @ 09:00:00 | Stop: 2022-11-10 | NDC 70092147550

## 2022-11-10 NOTE — Anesthesia Pre-Procedure Evaluation
Anesthesia Pre-Procedure Evaluation    Name: Raven [name]      MRN: [id number]     DOB: 10/01/1945     Age: 77 y.o.     Sex: female   _________________________________________________________________________     Procedure Info:   Procedure Information       Date/Time: 11/10/22 1300    Scheduled providers: Gabriel Cirri, MD; Clance Boll, RN; Lavell Luster, RT(R),LRT; Demetra Shiner, MD    Procedure: IR CENTRAL VENOUS CATHETER    Location: Interventional Radiology: [facility]          Physical Assessment  Vital Signs (last filed in past 24 hours):  BP: 107/44 (07/16 1102)  Temp: 36.8 ?C (98.2 ?F) (07/16 1102)  Pulse: 78 (07/16 1102)  Respirations: 16 PER MINUTE (07/16 1102)  SpO2: 98 % (07/16 1102)  O2 Device: None (Room air) (07/16 1102)  Weight: 75.3 kg (166 lb) (07/16 0215)      Patient History   Allergies   Allergen Reactions    Bortezomib RASH     Noted in Provider note 8/22 cancer treatment drug    Allopurinol HIVES    Sulfa (Sulfonamide Antibiotics) NAUSEA ONLY        Current Medications    Medication Directions   acetaminophen (TYLENOL) 325 mg tablet Take three tablets by mouth every 8 hours as needed for Pain. Indications: pain   acyclovir (ZOVIRAX) 400 mg tablet Take one tablet by mouth twice daily.   albuterol sulfate (PROAIR HFA) 90 mcg/actuation HFA aerosol inhaler Inhale two puffs by mouth into the lungs every 6 hours as needed for Wheezing or Shortness of Breath.   amiodarone (CORDARONE) 200 mg tablet Take two tablets by mouth twice daily for through 11/11/22, THEN one tablet daily thereafter starting 11/12/22. Continue on this dose until follow up. Indications: prevention of recurrent atrial fibrillation   apixaban (ELIQUIS) 5 mg tablet Take one tablet by mouth twice daily. Indications: treatment to prevent blood clots in chronic atrial fibrillation   aspirin 81 mg chewable tablet Chew one tablet by mouth daily. Indications: stroke prevention   budesonide-formoterol HFA (SYMBICORT) 160-4.5 mcg/actuation aerosol inhaler Inhale two puffs by mouth into the lungs twice daily. Indications: bronchospasm prevention with COPD   cholecalciferol (VITAMIN D) 1,000 units tablet Take one tablet by mouth daily.   cyanocobalamin (VITAMIN B-12) 1,000 mcg tablet Take 5,000 Units by mouth daily.   dexAMETHasone (DECADRON) 4 mg tablet TAKE 5 TABLETS(20 MG) BY MOUTH WEEKLY   dicyclomine (BENTYL) 10 mg capsule Take one capsule by mouth daily.   EMU OIL (BULK) MISC Use  as directed. Apply topically as needed   febuxostat (ULORIC) 40 mg tablet Take one-half tablet by mouth daily. Indications: taken on chemo days   ferrous sulfate (FEOSOL) 325 mg (65 mg iron) tablet Take one tablet by mouth daily. Take on an empty stomach at least 1 hour before or 2 hours after food.   insulin aspart (U-100) (NOVOLOG FLEXPEN U-100 INSULIN) 100 unit/mL (3 mL) PEN Inject  under the skin. Per sliding scale ( she doesn't have sheet with her)   insulin detemir U-100 (LEVEMIR FLEXTOUCH) 100 unit/mL (3 mL) injection pen Inject twenty five Units under the skin at bedtime daily.   metoprolol succinate XL (TOPROL XL) 25 mg extended release tablet Take 1.5 tablets by mouth daily. Indications: chronic heart failure, ventricular rate control in atrial fibrillation   milrinone (PRIMACOR) 200 mcg/mL infusion Administer 22.68 mcg/min through vein Continuous. Indications: sudden and  serious symptoms of heart failure called acute decompensated heart failure   nitroglycerin (NITROSTAT) 0.4 mg tablet Place one tablet under tongue every 5 minutes as needed for Chest Pain.   ONETOUCH VERIO TEST STRIPS test strip USE STRIP TO CHECK GLUCOSE TWICE DAILY FOR DIABETES MELLITUS   pantoprazole DR (PROTONIX) 40 mg tablet Take one tablet by mouth daily. Indications: gastritis   potassium chloride SR (K-DUR) 20 mEq tablet Take one tablet by mouth daily. Indications: prevention of low potassium in the blood   prochlorperazine maleate (COMPAZINE) 10 mg tablet Take one tablet by mouth every 6 hours as needed.   rosuvastatin (CRESTOR) 10 mg tablet Take one tablet by mouth daily. Indications: hardening of the arteries due to plaque buildup   sertraline (ZOLOFT) 50 mg tablet Take one tablet by mouth daily.   torsemide (DEMADEX) 20 mg tablet Take two tablets by mouth daily. Indications: accumulation of fluid resulting from chronic heart failure       Review of Systems/Medical History      Patient summary reviewed  Pertinent labs reviewed    PONV Screening: Non-smoker and Female sex    No history of anesthetic complications    No family history of anesthetic complications      Airway - negative        Pulmonary       Not a current smoker (20 pyh; quit 2010)        Asthma      COPD (rare SOA; denies hospitalizations), mild         Obstructive Sleep Apnea          Interventions: CPAP; compliant      Cardiovascular       Recent diagnostic studies:          ECG and echocardiogram      Exercise tolerance: <4 METS (3.63 METs per [facility]ASI)       Beta Blocker therapy: Yes      Beta blockers within 24 hours: Yes       : AICD and checked 07/07/2022- battery life 4.9 months                        Device Indication(s): heart failure and ventricular arrhythmia                        ICD with no prior shock          CIED interrogated last 12 months                        Battery life < 1 year              Magnet response: Defibrillator disabled                Hypertension, well controlled          Valvular problems/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):  MR          Coronary artery disease      Coronary artery bypass graft (2010)        PTCA (2010)            Dysrhythmias (VT)      PVD (moderate L carotid stenosis 2023 [place]; Hx of R CEA 2016)      CHF (EF 30-35% 11/2021;)        Dyspnea on exertion (stable)      GI/Hepatic/Renal  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)         No nausea      No vomiting        Severe diverticular disease        Neuro/Psych       No seizures        No CVA      Musculoskeletal Back pain      Arthritis:  osteo        Endocrine/Other       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; 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), well controlled, type 2, using insulin      Most recent Hgb A1C:7.2 - 9        Anemia      History of blood transfusion (07/2021- no reaction)        Malignancy (multiple myeloma- monthly Darzalex monthly (last dose 07/02/2022), uterine cancer- TAH/BSO; current colon mass s/p LAR):      Constitution - negative       Physical Exam    Airway Findings      Mallampati: I      TM distance: >3 FB      Neck ROM: full      Mouth opening: good    Dental Findings:       Upper dentures      Comments: 7 bottom teeth remain    Cardiovascular Findings:       Rhythm: regular      Rate: normal      Other findings: Murmur, peripheral edema (trace BLE)    Pulmonary Findings:       Breath sounds clear to auscultation.    Abdominal Findings:       Obese    Neurological Findings:       Alert and oriented x 3    Constitutional findings:       No acute distress      Well-developed       Diagnostic Tests  Hematology:   Lab Results   Component Value Date    HGB 7.4 11/10/2022    HCT 22.5 11/10/2022    PLTCT 224 11/10/2022    WBC 10.3 11/10/2022    NEUT 61 10/26/2022    ANC 12.22 11/01/2022    ANC 2 10/05/2022    LYMPH 10 11/01/2022    ALC 1.80 10/26/2022    MONA 1.18 10/26/2022    AMC 1.18 10/26/2022    EOSA 8 09/23/2022    ABC 0.05 10/26/2022    MCV 90.9 11/10/2022    MCH 29.7 11/10/2022    MCHC 32.7 11/10/2022    MPV 7.8 11/10/2022    RDW 24.5 11/10/2022         General Chemistry:   Lab Results   Component Value Date    NA 141 11/10/2022    K 3.5 11/10/2022    CL 106 11/10/2022    CO2 28 11/10/2022    GAP 7 11/10/2022    BUN 55 11/10/2022    CR 1.98 11/10/2022    GLU 128 11/10/2022    CA 8.4 11/10/2022    ALBUMIN 3.2 11/10/2022    LACTIC 1.5 11/01/2022    MG 2.0 11/06/2022    TOTBILI 0.9 11/10/2022    PO4 4.1 11/02/2022      Coagulation:   Lab Results   Component Value Date    PT 14.5 09/18/2022    PTT 70.6 09/22/2022    INR 1.3 09/18/2022 PAC Plan  T&S    T&C, CBC and BMP              Alerts    Anesthesia Plan    ASA score: 4   Plan: MAC  Induction method: intravenous  NPO status: acceptable      Informed Consent  Anesthetic plan and risks discussed with patient.  Use of blood products discussed with patient  Blood Consent: consented      Plan discussed with: anesthesiologist, CRNA and surgeon/proceduralist.

## 2022-11-10 NOTE — Anesthesia Post-Procedure Evaluation
Post-Anesthesia Evaluation    Name: Ronnye Casagrande      MRN: [id number]     DOB: [birth year]     Age: 77 y.o.     Sex: female   __________________________________________________________________________     Procedure Information       Anesthesia Start Date/Time: 11/10/22 1310    Scheduled providers: Gabriel Cirri, MD; Clance Boll, RN; Lavell Luster, RT(R),LRT; Demetra Shiner, MD    Procedure: IR CENTRAL VENOUS CATHETER    Location: Interventional Radiology: [facility]            Post-Anesthesia Vitals  BP: 139/65 (07/16 1415)  Pulse: 83 (07/16 1415)  SpO2: 97 % (07/16 1415)  O2 Device: None (Room air) (07/16 1350)   Vitals Value Taken Time   BP 139/65 11/10/22 1415   Temp     Pulse 83 11/10/22 1415   Respirations     SpO2 97 % 11/10/22 1415   O2 Device None (Room air) 11/10/22 1350   ABP     ART BP           Post Anesthesia Evaluation Note    Evaluation location: Pre/Post  Patient participation: recovered; patient participated in evaluation  Level of consciousness: alert    Pain score: 0  Pain management: adequate    Hydration: normovolemia  Temperature: 36.0?C - 38.4?C  Airway patency: adequate    Perioperative Events       Post-op nausea and vomiting: no PONV    Postoperative Status  Cardiovascular status: hemodynamically stable  Respiratory status: spontaneous ventilation        Perioperative Events  There were no known complications for this encounter.

## 2022-11-11 ENCOUNTER — Encounter: Admit: 2022-11-11 | Discharge: 2022-11-11 | Payer: MEDICARE

## 2022-11-11 NOTE — Telephone Encounter
Patient Discharge Date from hospital: 11/10/22  Date Call Attempted: 11/11/22  Number of Attempts: 1  Date Call Completed:  11/05/22    Two Patient Identifier complete: Yes [x]     Next Appointment    Next follow-up appointment on  11/17/2022 at 1300 with [name], APRN at our [facility] clinic    Transportation    Does pt have transportation?  Yes [x]     No []    NA []      Home Health    [facility] Ph) [phone] Fax) [phone]  [facility] Ph) [phone] Fax) [phone]     Medications    Does pt have all medications? Yes  [x]     No []       START taking:  amiodarone (CORDARONE)  Start taking on: November 09, 2022  CHANGE how you take:  milrinone (PRIMACOR)  torsemide [facility])    Diet    200 mg cholesterol, 2 G Na, 2 L fluid restriction     Is patient following prescribed diet and restrictions?  Yes [x]    No []      Scale/Weight    Does pt have a scale at home?  Yes [x]    No []     Did pt weight first thing this morning?  Yes [x]    No []      If yes, what was pt's first morning weight today? 164.4     Signs and Symptoms    Pt reports the following symptoms:     No BLE edema or upper abdominal bloating/chest tightness. States her SOA is better since admission.      PICC line dressing intact, without drainage, increased redness/swelling     L chest incision approximated, without drainage, increased redness/swelling. Rates pain 0/10.     Pt verbalized understanding of signs and symptoms of HF and when to contact a provider or seek immediate assistance at the ER.    Was pt given zone sheet? Yes [x]   No []     Intervention(s)    Pt educated on the importance of weighing daily first thing in the morning before dressing, before eating or drinking, and after voiding using the same scale in the same location and write results down in note pad or log. Notify us for weight gains of 3 lbs in one day or 5 lbs in one week. Notify your provider for increased SOA.  Notify your provider for swelling or increased swelling in BLE or abdominal fullness/bloating. Advised to check B/P at least once daily. Check 1-2 hours after am meds. Log results. Check B/P other times if feeling lightheaded, dizzy, or if you feel your heart rate is elevated. Document the time you checked and any symptoms you may be feeling at the time. Call 911 for sudden, severe chest/pain pressure/SOA develops. Be sure to keep your follow up appointment and bring your weight logs, B/P logs, and medication list with you to your appointment. Call us at [phone] if you have any questions.      DISCHARGE INCISION CARE As directed  You may shower once you are home; however avoid direct contact with the incision (allow the water to hit the back of  your shoulder rather than directly on the incision).  Do not submerge incision in tub, pool, hot tub, or lake for 4 weeks.  Unless your incision is bleeding or draining, keep it open to air.   Avoid applying deodorants, powders, creams, lotions, etc. to your incision for 4 weeks.  There are  no stitches to be removed. Your incision is closed with skin glue and there are internal sutures which will dissolve on their own. Please do not pick or pull at the skin glue. Please allow it to wear off on its own.  Your incision should gradually look better each day. Please notify our office immediately if you notice any of the following:  -an increase in swelling or redness  -any drainage  -increasing pain at the incision site  -fever over 100 degrees    Incision Care As directed  -Your incision was closed with surgical glue called Dermabond. There is no need for an additional dressing  -Do not pick or peel at the Dermabond. It will gradually flake off in a few weeks  -Avoid cream, lotion, or salve near the incision until it has fully healed  -It is normal to have discoloration or bruising    Bathing Restrictions/OK to shower  - Ok to shower but allow water to flow over the incision briefly with the shower stream to your back  - Avoid allowing the water to spray directly on the incision  - Gently pat dry with a clean towel  ?Avoid soaking or submerging the incision in water (bathtubs, swimming pools, hot tubs, or lakes) for 1  month  POST PROCEDURE ACTIVITY  Do not raise your elbow higher than shoulder level on the affected side for the first 4 weeks. It is important to continue to move your shoulder joint gently each day to prevent stiffening or freezing of the joint.  You can typically resume driving 5 days after surgery. There may be special considerations if you passed out before your device was implanted.  If you are an active sleeper, you should use your sling at night until the restrictions on your affected arm are lifted.  No pushing, pulling, or lifting greater than 10 - 15 pounds for one month with the affected arm.  You should avoid bowling, swimming, golfing or swinging a baseball bat with the affected arm for 90 days.  Avoid any activity/exercise which involves rough contact with device site or might cause a heavy blow to the skin over the device.  You may participate in sexual activity in 1 week; however avoid placing all your weight on arms for 4 weeks.  You may return to work in 3-5 days unless instructed otherwise. This will vary with your occupation, age, and overall physical condition.      Plan of Care    Continued education needed for heart failure symptom management and when to contact our office.

## 2022-11-12 ENCOUNTER — Encounter: Admit: 2022-11-12 | Discharge: 2022-11-12 | Payer: MEDICARE

## 2022-11-16 ENCOUNTER — Encounter: Admit: 2022-11-16 | Discharge: 2022-11-16 | Payer: MEDICARE

## 2022-11-17 ENCOUNTER — Encounter: Admit: 2022-11-17 | Discharge: 2022-11-17 | Payer: MEDICARE

## 2022-11-17 ENCOUNTER — Ambulatory Visit: Admit: 2022-11-17 | Discharge: 2022-11-18 | Payer: MEDICARE

## 2022-11-17 DIAGNOSIS — I10 Essential (primary) hypertension: Secondary | ICD-10-CM

## 2022-11-17 DIAGNOSIS — J449 Chronic obstructive pulmonary disease, unspecified: Secondary | ICD-10-CM

## 2022-11-17 DIAGNOSIS — I5022 Chronic systolic (congestive) heart failure: Secondary | ICD-10-CM

## 2022-11-17 DIAGNOSIS — F419 Anxiety disorder, unspecified: Secondary | ICD-10-CM

## 2022-11-17 DIAGNOSIS — I519 Heart disease, unspecified: Secondary | ICD-10-CM

## 2022-11-17 DIAGNOSIS — C55 Malignant neoplasm of uterus, part unspecified: Secondary | ICD-10-CM

## 2022-11-17 DIAGNOSIS — E119 Type 2 diabetes mellitus without complications: Secondary | ICD-10-CM

## 2022-11-17 DIAGNOSIS — J45909 Unspecified asthma, uncomplicated: Secondary | ICD-10-CM

## 2022-11-17 DIAGNOSIS — I2510 Atherosclerotic heart disease of native coronary artery without angina pectoris: Secondary | ICD-10-CM

## 2022-11-17 DIAGNOSIS — C801 Malignant (primary) neoplasm, unspecified: Secondary | ICD-10-CM

## 2022-11-17 DIAGNOSIS — Z79899 Other long term (current) drug therapy: Secondary | ICD-10-CM

## 2022-11-17 DIAGNOSIS — E785 Hyperlipidemia, unspecified: Secondary | ICD-10-CM

## 2022-11-17 DIAGNOSIS — M549 Dorsalgia, unspecified: Secondary | ICD-10-CM

## 2022-11-17 DIAGNOSIS — R609 Edema, unspecified: Secondary | ICD-10-CM

## 2022-11-17 DIAGNOSIS — H547 Unspecified visual loss: Secondary | ICD-10-CM

## 2022-11-17 DIAGNOSIS — M1990 Unspecified osteoarthritis, unspecified site: Secondary | ICD-10-CM

## 2022-11-17 DIAGNOSIS — R1900 Intra-abdominal and pelvic swelling, mass and lump, unspecified site: Secondary | ICD-10-CM

## 2022-11-17 DIAGNOSIS — I6521 Occlusion and stenosis of right carotid artery: Secondary | ICD-10-CM

## 2022-11-17 DIAGNOSIS — G4730 Sleep apnea, unspecified: Secondary | ICD-10-CM

## 2022-11-17 MED ORDER — POTASSIUM CHLORIDE 20 MEQ PO TBTQ
30 meq | ORAL_TABLET | Freq: Every day | ORAL | 3 refills | 30.00000 days | Status: AC
Start: 2022-11-17 — End: ?

## 2022-11-17 MED ORDER — TORSEMIDE 20 MG PO TAB
ORAL_TABLET | ORAL | 3 refills | 67.50000 days | Status: AC
Start: 2022-11-17 — End: ?

## 2022-11-17 NOTE — Progress Notes
Date of Service: 11/17/2022      HPI       I had the pleasure of seeing Raven [name], who is accompanied by a family member in the [facility] today for heart failure follow-up. Patient follows with Dr. Vanetta Shawl    Patient is a 77 y.o. 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       She was recently hospitalized at [facility] from 7/7 through 7/16 with a 4 pound weight gain since last office visit with me.  Her milrinone dose was increased to 0.3 mics per kilogram per minute, device interrogation showed ERI, EP was consulted with recommended upgrade of her ICD to CRT-D.  She had leukocytosis, ID was consulted and had no contraindications for GEN change and device upgrade.  She was transition to torsemide 40 mg daily in addition she also had significant anemia with a hemoglobin of 6.6 requiring blood transfusion.  On 7/15 her CBC for her milrinone became dislodged, she underwent replacement in interventional radiology on 7/16.  She was recommended to have outpatient follow-up with GI for EGD/EUS with polypectomy for gastric and duodenal polyps and needs an incision check today.          Patient Discharge Date from hospital: 11/10/22  Date Call Attempted: 11/11/22  Number of Attempts: 1  Date Call Completed:  11/05/22   Two Patient Identifier complete: Yes [x]    Next Appointment  11/17/2022 at 1300 with [name], APRN at our [facility] clinic        TODAY'S VISIT      Raven [name], accompanied by her son Raven [name], presents for a follow-up visit after a recent hospitalization. During the hospital stay, her hemoglobin had dropped to 6.6, but has since improved to 7.4 post-discharge. She denies any episodes of hematemesis or hematochezia. She reports nocturnal dyspnea but is able to sleep in bed with one pillow. She denies any need to sleep in a recliner.    During her hospitalization, a new pacemaker defibrillator was implanted. She has been experiencing weight fluctuations, with a recent increase of approximately 5 pounds. She reports occasional swelling in her ankles and feet, which her son also noticed.    Her current medication regimen includes amiodarone 200 mg daily, Eliquis 5 mg twice daily, torsemide 40 mg daily, potassium 20 mg daily, and metoprolol 37 mg daily. She has no issues with her melanoma pump infusion.    She has a history of melanoma and is currently receiving treatment, including regular injections. However, due to recent hospitalization and a holiday, she missed a couple of weeks of her injections. She also received a blood transfusion during her hospital stay.    She has plans for a trip to [facility] in the future, but there are concerns about her ability to travel due to her current health status.                 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    Goals of care, counseling/discussion 09/22/2022    Receiving inotropic medication 09/19/2022    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 09/19/2022    Mitral valve insufficiency 09/19/2022    Abnormal finding on imaging 09/18/2022    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    Gastric polyp 09/18/2022    Polyp of duodenum 09/18/2022    Diverticulitis 08/10/2022    History of MI (myocardial infarction) 05/21/2022    Acute cystitis without hematuria 03/18/2022    Colitis 03/18/2022    Compression fracture of L1 lumbar vertebra (HCC) 03/18/2022    Leukocytosis 03/18/2022    Intractable low back pain 03/17/2022    Chronic kidney disease, 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/2022    Hypokalemia 11/25/2021    S/P drug eluting coronary stent placement 08/21/2021    Benign essential hypertension 08/21/2021    Left carotid stenosis 08/21/2021    Malignant neoplasm of uterus (HCC) 08/21/2021    Obstructive sleep apnea syndrome 08/21/2021    Coronary artery disease involving native coronary artery of native heart without angina pectoris 08/12/2021    Cardiomyopathy, ischemic 08/12/2021    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    Multiple myeloma (HCC) 01/05/2020    Type 2 diabetes mellitus without complications (HCC) 12/06/2018    Chronic obstructive pulmonary disease, unspecified (HCC) 12/06/2018    Dyslipidemia 12/06/2018       ROS:See HPI      Physical Exam  General Appearance: In NAD, pale  Neck Veins: normal JVP, neck veins are not distended; no HJR   Chest Inspection: right SCL dressing CDI, slight redness noted at insertion site, no drainage or palpable cord noted  Respiratory Effort: breathing comfortably, no respiratory distress   Auscultation/Percussion: lungs clear to auscultation, no rales, rhonchi or wheezing   Cardiac Rhythm: regular rhythm and tachycardic rate   Cardiac Auscultation: S1, S2 normal, no rub, no definite S3  or S4   Murmurs: no murmur   Peripheral Circulation: normal peripheral circulation   Pedal Pulses: normal symmetric pedal pulses   Lower Extremity Edema: 1+ right lower extremity edema   Abdominal Exam: soft, non-tender, no obvious masses, bowel sounds normal   Gait & Station: In a wheelchair today  Orientation: oriented to person, place and time   Affect & Mood: appropriate and sustained affect   Language and Memory: patient responsive and seems to comprehend information Neurologic Exam: neurological assessment grossly intact   Vital signs were reviewed      Cardiovascular Studies:Echocardiographic Findings 09/18/2022   Left Ventricle The left ventricular wall thickness is normal. Normal geometry. The left ventricular systolic function is moderately reduced. The visually estimated ejection fraction is 30%. Unable to assess left ventricular diastolic function. Unable to assess left atrial pressure.   Right Ventricle The right ventricle is mildly dilated. The right ventricular systolic function is mildly reduced. Pacemaker lead present in the ventricle.   Left Atrium Mildly dilated.   Right Atrium Not well seen. Normal size. Pacemaker lead present in the right atrium.   IVC/SVC Inferior vena cava was not well seen; right atrial pressure could not be  estimated.   Mitral Valve No stenosis. Moderate to severe regurgitation. There is mild mitral annular calcification without stenosis.   Tricuspid Valve Valve has a dilated annulus. No stenosis. Severe regurgitation.   Aortic Valve The aortic valve was not well seen. Trace regurgitation.   Pulmonary The pulmonic valve was not seen well but no Doppler evidence of stenosis.   Aorta The ascending aorta is mildly dilated.   Pericardium No pericardial effusion.      5/24 RHC RA 13, RV 70/12, PA 72/27/47, PCWP 17, CO 1.9, CI 1.1 via FICK, CO 2.5, CI 1.4 via thermo with PA sat 41%.      Gastric biopsy 09/17/22:  Oxyntic-type mucosa with focally active gastritis.   Antral-type mucosa with features of reactive gastropathy.   Immunohistochemical stain for H. pylori is negative.      Problems Addressed Today  Encounter Diagnoses   Name Primary?    Chronic systolic heart failure (HCC) Yes    Receiving inotropic medication               Assessment/Plan:  HFrEF due to ICM  The most recent echo as noted above.    The patient today describes NYHA Functional Class III, stage C symptoms.    >Diuretic Therapy   Current Dose:  torsemide 40mg  daily   Changes: 7/23: Will change the torsemide to 40 mg twice daily for 3 days then reduce to 40 mg a.m., 20 mg p.m.     She appears hypervolemic today weight today is 172 pounds, dry weight has been 165 pounds..  I reviewed most recent labs and most recent cardiac testing with patient and family.  Shared medical decision making involves eliciting patient and/or family preferences, education and explaining risks and benefits of management options.  HF Therapy:   GDMT Current Dose Changes made at visit   BB Toprol-XL 37.5 mg daily    ACEI/ARB/ARNI None, CKD    Aldosterone antagonist None, CKD    SGLT2i None PTA, at next office visit can consider addition of this    Hydralazine/Nitrate Not applicable, on inotropes    Ivabradine Not applicable    Cardiac rehab Not applicable    Advanced Therapies Candidacy/Contraindications   N/A            Ambulatory cardiogenic shock: She underwent right heart cath on 09/18/2022 that showed cardiac index of 1.1, she was started on milrinone, currently on 0. 3 0 mcg/kg/min    CRT-D in situ: She underwent GEN change with Dr. Venita Lick, she did not meet criteria for upgrade to CRT-D since the QRS duration was less than 0.12.    Multiple myeloma: Follows at [facility], was seen during this admission by Dr. Mitchell Heir.    She has chronic anemia, her hemoglobin on 7/13 was 6.6, she did have a unit of blood, repeat CBC on 7/22 showed hemoglobin 7.4  -She is scheduled to see GI in November.  She reports that she will be seeing oncology and she usually gets a medication that stimulates RBC production    -She follows with  [facility] oncology she was given Darzalex subcutaneous as well as Retacrit.     -  Lab Results   Component Value Date    KAPPAFLC 1.65 09/19/2022    LAMBDAFLC 0.81 09/19/2022    FREELCRAT 2.04 (H) 09/19/2022    IMMUNOFIXS  09/19/2022     NO PARAPROTEIN SEEN  CONFIRMED BY IMMUNOFIXATION GEL      NTPROBNP 14,358.0 (H) 11/01/2022  NTPROBNP 13,442.0 (H) 11/01/2022    NTPROBNP 5,573.0 (H) 10/23/2022       [employer]. Jude ICD in Situ  - Device Interrogation: Presenting rhythm:  VS, irregular, Suggestive of Afib, V rates 60s-90s bpm    HR histogram since 09/02/22 shows V rates >90bpm ~60% of time, > 110bpm ~40%     Atrial fibrillation; last EKG on 6/5 showed sinus tachycardia with PVCs.  - Rates 90's -100's  > Continue Eliquis 5mg  BID   > Toprol as above   > If rate uncontrolled could consider AVN ablation in future          CAD s/p CABG x3 '10, PCI in '10  HLD  Carotid artery disease with prior endarterectomy '16  - PTA Plavix s/p CEA, this was stopped 09/21/22 and started on bASA so intervention by GI could be performed in outpt setting       COPD   OSA with CPAP use  - PTA Proair    Hx diverticulosis s/p sigmoid colon mass resection 08/10/22  Pancreatic cysts  Fat stranding at gastric outlet/proximal duodenum  - Recent surgery with surgical oncology for colonic mass resection, found to be diverticulitis. No signs of malignancy.  - BC on admission Negative   - CT abd/pelv 5/22: RUQ fat abutting the gastric outlet, possibly gastritis or duodenitis. Several cystic lesions in tail of pancreas  - EGD 5/23: normal esophagus, 3cm hiatal hernia, congestive gastropathy, erythematous mucosa biopsied in prepyloric region, 6mm sessile polyp in gastric  body, 2 polyps in duodenum, poylps not removed due to Plavix taken 5/21  - No surgical intervention per surgical oncology    -Will see GI in November         CKD stage III:  Lab Results   Component Value Date    CR 2.1 (H) 11/16/2022    CR 1.98 (H) 11/10/2022    CR 2.13 (H) 11/09/2022           Diabetes Mellitus Type II- Managed by Primary team.    Lab Results   Component Value Date/Time    HGBA1C 8.0 (H) 09/16/2022 07:02 PM    HGBA1C 7.4 (H) 07/14/2022 02:44 PM         Lab Results   Component Value Date    CHOL 107 09/16/2022    TRIG 72 09/16/2022    HDL 50 09/16/2022    LDL 45 09/16/2022    VLDL 14 09/16/2022    NONHDLCHOL 57 09/16/2022       Follow up appointment:   she will see Dr. [name] on 9/9, I will see her on 8/8.    I have personally documented the HPI, exam and medical decision making.  Patient education: I reviewed recent lab results and current medications, medication instructions, discussed heart failure signs & symptoms,  low  sodium diet, fluid restriction and daily weights.I have instructed the patient on the plan of care and they verbalize understanding of the plan. Please see AVS for full patient teaching. Patient advised to call our office if s/he has any problems, questions, worsening symptoms, or concerns prior to the next appointment.   Thanks for allowing me to see this nice patient. If I can be of additional assistance, please don't hesitate to contact me.     Current Outpatient Medications on File Prior to Visit   Medication Sig Dispense Refill    acetaminophen (TYLENOL) 325 mg tablet Take three tablets by mouth every 8 hours as needed for Pain. Indications: pain  40 tablet 0    acyclovir (ZOVIRAX) 400 mg tablet Take one tablet by mouth twice daily.      albuterol sulfate (PROAIR HFA) 90 mcg/actuation HFA aerosol inhaler Inhale two puffs by mouth into the lungs every 6 hours as needed for Wheezing or Shortness of Breath.      amiodarone (CORDARONE) 200 mg tablet Take two tablets by mouth twice daily for through 11/11/22, THEN one tablet daily thereafter starting 11/12/22. Continue on this dose until follow up. Indications: prevention of recurrent atrial fibrillation 40 tablet 0    apixaban (ELIQUIS) 5 mg tablet Take one tablet by mouth twice daily. Indications: treatment to prevent blood clots in chronic atrial fibrillation 180 tablet 0    aspirin 81 mg chewable tablet Chew one tablet by mouth daily. Indications: stroke prevention      budesonide-formoterol HFA (SYMBICORT) 160-4.5 mcg/actuation aerosol inhaler Inhale two puffs by mouth into the lungs twice daily. Indications: bronchospasm prevention with COPD 30.6 g 0    cholecalciferol (VITAMIN D) 1,000 units tablet Take one tablet by mouth daily.      cyanocobalamin (VITAMIN B-12) 1,000 mcg tablet Take 5,000 Units by mouth daily.      dexAMETHasone (DECADRON) 4 mg tablet TAKE 5 TABLETS(20 MG) BY MOUTH WEEKLY      dicyclomine (BENTYL) 10 mg capsule Take one capsule by mouth daily.      EMU OIL (BULK) MISC Use  as directed. Apply topically as needed      febuxostat (ULORIC) 40 mg tablet Take one-half tablet by mouth daily. Indications: taken on chemo days 45 tablet 0    ferrous sulfate (FEOSOL) 325 mg (65 mg iron) tablet Take one tablet by mouth daily. Take on an empty stomach at least 1 hour before or 2 hours after food.      insulin aspart (U-100) (NOVOLOG FLEXPEN U-100 INSULIN) 100 unit/mL (3 mL) PEN Inject  under the skin. Per sliding scale ( she doesn't have sheet with her)      insulin detemir U-100 (LEVEMIR FLEXTOUCH) 100 unit/mL (3 mL) injection pen Inject twenty five Units under the skin at bedtime daily.      metoprolol succinate XL (TOPROL XL) 25 mg extended release tablet Take 1.5 tablets by mouth daily. Indications: chronic heart failure, ventricular rate control in atrial fibrillation 90 tablet 1    milrinone (PRIMACOR) 200 mcg/mL infusion Administer 22.68 mcg/min through vein Continuous. Indications: sudden and serious symptoms of heart failure called acute decompensated heart failure      nitroglycerin (NITROSTAT) 0.4 mg tablet Place one tablet under tongue every 5 minutes as needed for Chest Pain.      ONETOUCH VERIO TEST STRIPS test strip USE STRIP TO CHECK GLUCOSE TWICE DAILY FOR DIABETES MELLITUS      pantoprazole DR (PROTONIX) 40 mg tablet Take one tablet by mouth daily. Indications: gastritis 90 tablet 1    prochlorperazine maleate (COMPAZINE) 10 mg tablet Take one tablet by mouth every 6 hours as needed.      rosuvastatin (CRESTOR) 10 mg tablet Take one tablet by mouth daily. Indications: hardening of the arteries due to plaque buildup 90 tablet 1    sertraline (ZOLOFT) 50 mg tablet Take one tablet by mouth daily.       No current facility-administered medications on file prior to visit.       [name] AGPCNP-BC, CHFN,HF-Cert  Advanced Heart Failure APP  [facility]  Collaborating physician: Dr. Vanetta Shawl  Plan:  The following is a copy of the written instructions and plan I gave to the patient during her office visit.    Patient Instructions   Thank you for choosing the [facility].   Your Care Plan:   Medications: Will increase your torsemide to 40 mg twice daily for 3 days, then reduce it to 40 mg in the morning, 20 mg in the afternoon.  Take an extra 10 mill equivalents potassium every day.  Labs: Get a repeat chemistry in 1 week.- WIll ask [facility] to do with next Monday's home visit  Test:   Follow up appointment lets move that appointment that is scheduled with me on August 13 up to the eighth.      Please let us know if your weight is not getting closer to your dry weight of 165 pounds, if you are dizzy or lightheaded.    If you are being seen in the Heart Failure clinic:   Follow a 2000 mg sodium diet and 2-liter fluid restriction.    Call for any worsening symptoms of shortness of breath, swelling, sudden weight gain, lightheadedness, heart racing, or chest pain.  Weigh yourself daily and call us if your weight increase is greater than 3 pounds overnight OR 5 pounds in one week.   It is helpful for you to bring your list of current medications with you to all clinic visit.     Contacting our office:     -Business Hours: Monday-Friday, 8:00 am-4:30 pm (excluding Holidays).      -For medical questions or concerns, please send [place] a message through your MyChart account or call the Advance Heart Failure team nursing triage line at [phone]. Please leave a detailed message with your name, date of birth, and reason for your call.  If your message is received before 3:30pm, every effort will be made to call you back the same day. Please allow time for [place] to review your chart prior to call back.      -For medication refills please start by contacting your pharmacy. You can also send [place] a prescription question through your MyChart or call the nurse triage line above.      -Should you have an immediate need of the weekend/nights and holidays, please call our on-call triage line at [phone].    -To schedule any follow up appointments or to change an existing appointment please contact our scheduling department at [phone].     Below are [place] outpatient lab locations that do not require an appointment. You can walk in during the times below:     [facility] ([address] [place], [place], [place]): M-F 7:30am-4:30pm; Avoid noon-1pm   [facility] ([address], [place], [place]): M-F 7a-6p; Saturday 7a-12p   [facility] ([address], [place], [place]) M-F 7:30a-5:30p;  Sat and Sun 7a-12p   [facility] ([address], [place], [place]) M-F 8a-5p   [facility] ([address] [place], [place], [place]) M-F 8a-4:30p  [facility] ([address] [place], [place]) M-F 8a-4p  Jeanell Sparrow: [address] [place], [place] [address] Phone [phone] M-T 7a-6p F: 7a -4p    Team [place] Providers:  Dr. Vanetta Shawl, MD  Dr. Lyndel Safe, MD   [name], AGCNP-BC, [facility], HF-Cert                                   Ivory Broad, APRN, ANP-C, [facility]                                          [place], [place]                                                 Nurses:   Elayne Snare  Magdalen Spatz  [place] Poore  [facility]  [address] [place], [place] [address]  Phone: [phone] Fax: [phone]    At the [facility], you and your family are our top priority.  You may receive a survey via email or text message that we are asking you to complete.  We value your feedback to ensure you are satisfied with every visit and we are continually providing the highest quality of patient care.  We know your time is valuable and thank you in advance for completing the survey.                             Future Appointments   Date Time Provider Department Center   12/08/2022  3:30 PM Jed Limerick CV[facility] CVM Exam   01/04/2023  3:30 PM Arneta Cliche, MD [facility] CVM Exam   03/17/2023  2:40 PM Dahiya, Dellis Anes IM

## 2022-11-19 ENCOUNTER — Encounter: Admit: 2022-11-19 | Discharge: 2022-11-19 | Payer: MEDICARE

## 2022-11-23 ENCOUNTER — Encounter: Admit: 2022-11-23 | Discharge: 2022-11-23 | Payer: MEDICARE

## 2022-11-24 ENCOUNTER — Encounter: Admit: 2022-11-24 | Discharge: 2022-11-24 | Payer: MEDICARE

## 2022-11-26 ENCOUNTER — Encounter: Admit: 2022-11-26 | Discharge: 2022-11-26 | Payer: MEDICARE

## 2022-11-26 MED ORDER — TORSEMIDE 20 MG PO TAB
40 mg | ORAL_TABLET | Freq: Two times a day (BID) | ORAL | 3 refills | 67.50000 days | Status: AC
Start: 2022-11-26 — End: ?

## 2022-11-26 NOTE — Telephone Encounter
[name], Raven  P Cvm Nurse Hf Team [place]  Hello!    Pt states shed does not have enough Torsemide to get through the weekend due to frequency & dose increase.

## 2022-11-27 ENCOUNTER — Encounter: Admit: 2022-11-27 | Discharge: 2022-11-27 | Payer: MEDICARE

## 2022-11-27 NOTE — Progress Notes
Date of Service: 11/30/2022      HPI       Raven [name] was seen today in the Heart Failure Clinic today. Raven [name] is a 77 y.o. 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     Raven [name] was last seen by [name], APRN  on 11/17/22.  At that time, her weight was noted to be 172 pounds and she was hypervolemic upon exam, and  she was instructed to increase her torsemide for 3 days and then resume her normal dosing.     There was a phone call from an APRN from [facility] reporting that the patient appeared hypervolemic upon exam.  She was instructed to increase her torsemide to 40mg  po BID until she was seen today.      However, an additional phone call was placed on 11/29/2022 with complaints of ongoing weight gain.  Raven Anis, APRN, was working that day and instructed her to increase her torsemide to 60 mg twice daily.    Today Raven [name] since the clinic accompanied by family.  Her weight decreased 1 pound since yesterday.  Her dry weight on her home scale was 165 pounds.  Today, on her scale, she was 172.4 pounds.  She does admit to being a little more dyspneic with exertion.  She does have lower extremity edema as well.  She denies any PND or orthopnea, dizziness or lightheadedness, chest pain or palpitations.  She did take increased dose of torsemide 60 mg last night as well as again this morning.    She is hoping to travel with family to [facility] in late October 2024.         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 09/19/2022    Mitral valve insufficiency 09/19/2022    Abnormal finding on imaging 09/18/2022    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    Gastric polyp 09/18/2022    Polyp of duodenum 09/18/2022    Diverticulitis 08/10/2022    History of MI (myocardial infarction) 05/21/2022    Acute cystitis without hematuria 03/18/2022    Colitis 03/18/2022    Compression fracture of L1 lumbar vertebra (HCC) 03/18/2022    Leukocytosis 03/18/2022    Intractable low back pain 03/17/2022    Chronic kidney disease, 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/2022    Hypokalemia 11/25/2021    S/P drug eluting coronary stent placement 08/21/2021    Benign essential hypertension 08/21/2021    Left carotid stenosis 08/21/2021  Malignant neoplasm of uterus (HCC) 08/21/2021    Obstructive sleep apnea syndrome 08/21/2021    Coronary artery disease involving native coronary artery of native heart without angina pectoris 08/12/2021    Cardiomyopathy, ischemic 08/12/2021    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    Multiple myeloma (HCC) 01/05/2020    Type 2 diabetes mellitus without complications (HCC) 12/06/2018    Chronic obstructive pulmonary disease, unspecified (HCC) 12/06/2018    Dyslipidemia 12/06/2018             Physical Exam  General Appearance: In NAD  Neck Veins: JVD ~6cm sitting upright, +HJR  Chest Inspection: chest is normal in appearance   Respiratory Effort: breathing comfortably, no respiratory distress   Auscultation/Percussion: lungs clear to auscultation, no rales, rhonchi or wheezing   Cardiac Rhythm: regular rhythm and normal rate   Cardiac Auscultation: S1, S2 normal, no rub, no definite S3  or S4   Murmurs: 2/6 systolic  murmur   Abdominal Exam: soft, non-tender, bowel sounds normal   Gait & Station: uses motorized scooter  Orientation: oriented to person, place and time   Affect & Mood: appropriate and sustained affect   Language and Memory: patient responsive and seems to comprehend information   Neurologic Exam: neurological assessment grossly intact   Vital signs were reviewed  CVC to right chest      Cardiovascular Studies  Echo 09/17/22:  Severely depressed left ventricular systolic function. LVEF 30%  Mild right ventricular enlargement.   Moderate to severe mitral regurgitation. Mitral annular calcification is present.   The aortic valve is not well seen.  Trace regurgitation. Calcified annulus with acoustic shadowing. Mean gradient is 4-5 mmHg.  Dilated annulus with severe tricuspid valve regurgitation   No pericardial effusion.     RHC 09/18/22  RA 13  RV 70/12  PA 72/27 with mean 47  PCWP 17  TPG 30  PVR 12  CO/CI 2.5/1.4 by thermo  CO/CI 1.9/1.1 by Fick      Problems Addressed Today  Encounter Diagnoses   Name Primary?    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     Coronary artery disease involving native coronary artery of native heart, unspecified whether angina present     Nonrheumatic mitral valve regurgitation               Assessment/Plan:    Chronic cardiogenic shock  - RHC 09/18/2022:  CI 1.1 by Fick,   - now on milrinone 0.41mcg/kg/min     HFrEF   ICM  - Estimated dry weight: ~165 pounds  - hypervolemic upon exam  - Diuretic regimen:   Home diuretics: Current Changes    Torsemide 60mg  last evening and this morning (had been torsemide 40mg  po BID) 8/5: Continue torsemide 40mg  po BID with metolazone 5mg  po PRN as directed (take one tab 8/6)       - NYHA class III, stage D symptoms  - GDMT: GDMT Current Dose Changes   BB Metoprolol XL 37.5mg  po daily    ACEI/ARB/ARNI  None-CKD    Aldosterone Antagonist None-CKD    SGLT-2 Inhibitor  none    Hydralazine/Nitrate  none    Ivabradine none      - Last echo: 08/2022 as noted   - Device: ICD in situ; last interrogation 11/05/22: normal device function, AP<1%, VP<1%, stable heart failure diagnostics  - Labs: repeat labs on 8/7 with  HH  - Cardiac Rehab: referred 11/04/22  - Follow-up: 2 weeks with Annabelle Harman    CAD  - s/p CABG x 3 2010   - s/p PCI 2010  - denying anginal type symptoms   - continue asa and statin.    Severe tricuspid regurgitation  Moderate mitral regurgitation  - Potential mitral intervention evaluation in future depending on cancer outcomes etc.    Hypertension:  - continue medications as noted above    CKD  - recent creatinine trends 1.7-2.1  - last creatinine on 11/22/00: 2.02  - HH drawing weekly labs - next draw 8/7    Paroxysmal Atrial Fibrillation  - Diagnosed during May 2024 visit   - oral anticoagulation: Eliquis 5mg  po BID  - denies bleeding issues.   - Rate controlled with beta-blocker and amiodarone 200mg  po daily      Diabetes Mellitus Type II  - HgA1C   Lab Results   Component Value Date/Time    HGBA1C 8.0 (H) 09/16/2022 07:02 PM    HGBA1C 7.4 (H) 07/14/2022 02:44 PM    - management per PCP    Dyslipidemia   Lab Results   Component Value Date    CHOL 107 09/16/2022    TRIG 72 09/16/2022    HDL 50 09/16/2022    LDL 45 09/16/2022    VLDL 14 09/16/2022    NONHDLCHOL 57 09/16/2022      - continue rosuvastatin 10 mg po daily      Multiple myeloma  Endometrial cancer s/p total hysterectomy and bilateral salpingo oophorectomy   - Follows with Dr. Mitchell Heir at [facility] in [place]  - Chemotherapy 10/22/2022  - Receiving subcutaneous Darzalex as well as Retacrit    Obesity  - Body mass index is 31.93 kg/m?.   - Weight loss, Cardiac Healthy diet, and exercise encouraged    History of diverticulosis status post sigmoid colon mass resection 08/10/2022  Pancreatic cyst  Fat stranding at gastric outlet proximal duodenum  - Recent surgery with surgical oncology for colonic mass resection, found to be diverticulitis. No signs of malignancy.  - CT abd/pelv 5/22: RUQ fat abutting the gastric outlet, possibly gastritis or duodenitis. Several cystic lesions in tail of pancreas  - EGD 5/23: normal esophagus, 3cm hiatal hernia, congestive gastropathy, erythematous mucosa biopsied in prepyloric region, 6mm sessile polyp in gastric  body, 2 polyps in duodenum, poylps not removed due to Plavix taken 5/21  - No surgical intervention per surgical oncology   - Completed course of Augmentin 500mg  BID for possible gastritis/duodenitis     COPD  OSA on CPAP  Reviews tobacco abuse  - 1 PPD for 20 years quit in 2010  - PTA ProAir           Plan:  The following is a copy of the written instructions and plan I gave to the patient during her office visit.    Patient Instructions   Thank you for choosing the [facility].   Your Care Plan:   Medications: continue torsemide 40mg  (2 tabs) twice a day.  Take metolazone 5mg  (1 tab) tomorrow morning with your torsemide.  Continue 1.5 tabs of potassium every day  Tomorrow, when you take metolazone, take an extra potassium  Labs: labs on Wednesday  Test: none  Follow up appointment: 2 weeks with Annabelle Harman    If you are being seen in the Heart Failure clinic:   Follow a 2000 mg sodium diet and 2-liter fluid restriction.  Call for any worsening symptoms of shortness of breath, swelling, sudden weight gain, lightheadedness, heart racing, or chest pain.  Weigh yourself daily and call us if your weight increase is greater than 3 pounds overnight OR 5 pounds in one week.   It is helpful for you to bring your list of current medications with you to all clinic visit.     Contacting our office:     -Business Hours: Monday-Friday, 8:00 am-4:30 pm (excluding Holidays).      -For medical questions or concerns, please send [place] a message through your MyChart account or call the Advance Heart Failure team nursing triage line at [phone]. Please leave a detailed message with your name, date of birth, and reason for your call.  If your message is received before 3:30pm, every effort will be made to call you back the same day.  Please allow time for [place] to review your chart prior to call back.      -For medication refills please start by contacting your pharmacy. You can also send [place] a prescription question through your MyChart or call the nurse triage line above.      -Should you have an immediate need of the weekend/nights and holidays, please call our on-call triage line at [phone].    -To schedule any follow up appointments or to change an existing appointment please contact our scheduling department at [phone].     Below are [facility] outpatient lab locations that do not require an appointment. You can walk in during the times below:     [facility] ([address] [place], [place], [place]): M-F 7:30am-4:30pm; Avoid noon-1pm   [facility] ([address] [place], [place]): M-F 7a-6p; Saturday 7a-12p   [facility] ([address], [place], [place]) M-F 7:30a-5:30p;  Sat and Sun 7a-12p   [facility] ([address], [place], [place]) M-F 8a-5p   [facility] ([address], [place], [place]) M-F 8a-4:30p  [facility] ([address] [place], [place]) M-F 8a-4p  Jeanell Sparrow: [address] [place], [place] [address] Phone [phone] M-T 7a-6p F: [phone] -4p    Team [facility] Providers:                                                                                                                                    Dr. Vanetta Shawl, MD  Dr. Lyndel Safe, MD   [name], AGCNP-BC, [facility], HF-Cert                                   Ivory Broad, APRN, ANP-C, CHFN Inge Rise, ANP-C  Nurses:   Lili Albers  Tamara Amick  [place] Poore  [facility] at [facility]  [address] [place], [place] [address]  Phone: [phone] Fax: [phone]    At the [facility], you and your family are our top priority.  You may receive a survey via email or text message that we are asking you to complete.  We value your feedback to ensure you are satisfied with every visit and we are continually providing the highest quality of patient care.  We know your time is valuable and thank you in advance for completing the survey.                             Future Appointments   Date Time Provider Department Center   12/02/2022 10:00 AM Vertell Novak, MD [facility] IM   12/14/2022  8:00 AM Fredricka Bonine, APRN-NP [facility] CVM Exam   01/04/2023  3:00 PM Arneta Cliche, MD [facility] C[facility] Exam             Total Time Today was 40 minutes in the following activities: Preparing to see the patient, Obtaining and/or reviewing separately obtained history, Performing a medically appropriate examination and/or evaluation, Counseling and educating the patient/family/caregiver, Ordering medications, tests, or procedures and Documenting clinical information in the electronic or other health record.      Ivory Broad, APRN, CHFN  Ad[facility] Failure APP  [facility]  Collaborating physicians: Drs. Zubair Shah/Tarun Dalia

## 2022-11-29 ENCOUNTER — Encounter: Admit: 2022-11-29 | Discharge: 2022-11-29 | Payer: MEDICARE

## 2022-11-29 NOTE — Telephone Encounter
Received AMS alert from Debbie with [facility] reporting patient having weight gain.  I returned call to Debbie at [phone] and she reports patient has gain 1.6 pounds in 1 day and 4.2 pounds in 2 days.  Patient's weight today was 173.8.  Patient is experiencing worsening shortness of breath and worsening lower extremity edema.  Patient did have pizza yesterday, for which Debbie educated patient on sodium intake.  Patient's torsemide dosing was increased to 40mg  BID 8/1 and patient reports she has not noticed any difference with the increased dosing.  Patient's blood pressure yesterday was 132/74, has not taken today.  Pulse and O2 today both 94.  Patient does have follow-up with [place] tomorrow.  I told Eunice Blase I would review with heart failure provider on call and return call with recommendations.  Understanding verbalized.    AMS notification sent to Jan, on call NP, who recommends patient increase torsemide dosing to 60mg  BID today and increase potassium to today.  Patient to have follow-up and labs tomorrow.    I returned call to [facility] and reviewed recommendation.  Understanding verbalized.

## 2022-11-30 ENCOUNTER — Encounter: Admit: 2022-11-30 | Discharge: 2022-11-30 | Payer: MEDICARE

## 2022-11-30 ENCOUNTER — Ambulatory Visit: Admit: 2022-11-30 | Discharge: 2022-11-30 | Payer: MEDICARE

## 2022-11-30 DIAGNOSIS — C801 Malignant (primary) neoplasm, unspecified: Secondary | ICD-10-CM

## 2022-11-30 DIAGNOSIS — I5043 Acute on chronic combined systolic (congestive) and diastolic (congestive) heart failure: Secondary | ICD-10-CM

## 2022-11-30 DIAGNOSIS — I6521 Occlusion and stenosis of right carotid artery: Secondary | ICD-10-CM

## 2022-11-30 DIAGNOSIS — E119 Type 2 diabetes mellitus without complications: Secondary | ICD-10-CM

## 2022-11-30 DIAGNOSIS — M549 Dorsalgia, unspecified: Secondary | ICD-10-CM

## 2022-11-30 DIAGNOSIS — F419 Anxiety disorder, unspecified: Secondary | ICD-10-CM

## 2022-11-30 DIAGNOSIS — M1990 Unspecified osteoarthritis, unspecified site: Secondary | ICD-10-CM

## 2022-11-30 DIAGNOSIS — J449 Chronic obstructive pulmonary disease, unspecified: Secondary | ICD-10-CM

## 2022-11-30 DIAGNOSIS — I340 Nonrheumatic mitral (valve) insufficiency: Secondary | ICD-10-CM

## 2022-11-30 DIAGNOSIS — C55 Malignant neoplasm of uterus, part unspecified: Secondary | ICD-10-CM

## 2022-11-30 DIAGNOSIS — H547 Unspecified visual loss: Secondary | ICD-10-CM

## 2022-11-30 DIAGNOSIS — R609 Edema, unspecified: Secondary | ICD-10-CM

## 2022-11-30 DIAGNOSIS — I519 Heart disease, unspecified: Secondary | ICD-10-CM

## 2022-11-30 DIAGNOSIS — E785 Hyperlipidemia, unspecified: Secondary | ICD-10-CM

## 2022-11-30 DIAGNOSIS — R570 Cardiogenic shock: Secondary | ICD-10-CM

## 2022-11-30 DIAGNOSIS — J45909 Unspecified asthma, uncomplicated: Secondary | ICD-10-CM

## 2022-11-30 DIAGNOSIS — R1900 Intra-abdominal and pelvic swelling, mass and lump, unspecified site: Secondary | ICD-10-CM

## 2022-11-30 DIAGNOSIS — I2510 Atherosclerotic heart disease of native coronary artery without angina pectoris: Secondary | ICD-10-CM

## 2022-11-30 DIAGNOSIS — G4730 Sleep apnea, unspecified: Secondary | ICD-10-CM

## 2022-11-30 DIAGNOSIS — I10 Essential (primary) hypertension: Secondary | ICD-10-CM

## 2022-11-30 MED ORDER — METOLAZONE 5 MG PO TAB
5 mg | Freq: Every day | ORAL | 0 refills | 84.00000 days | Status: AC | PRN
Start: 2022-11-30 — End: ?

## 2022-12-02 ENCOUNTER — Encounter: Admit: 2022-12-02 | Discharge: 2022-12-02 | Payer: MEDICARE

## 2022-12-03 ENCOUNTER — Encounter: Admit: 2022-12-03 | Discharge: 2022-12-03 | Payer: MEDICARE

## 2022-12-04 ENCOUNTER — Encounter: Admit: 2022-12-04 | Discharge: 2022-12-04 | Payer: MEDICARE

## 2022-12-04 NOTE — Telephone Encounter
Reviewed with Raven [name] she recommends that patient take an extra 30 mEq potassium today.     Called and spoke with patient. Confirmed patient's identity with full name and DOB. She took metolazone and potassium as prescribed and weight is down to 161 lbs. She reports she is feeling well and asymptomatic today. She is agreeable to recommendations above, she does not have any questions or concerns at this time.

## 2022-12-07 ENCOUNTER — Encounter: Admit: 2022-12-07 | Discharge: 2022-12-07 | Payer: MEDICARE

## 2022-12-07 LAB — COMPREHENSIVE METABOLIC PANEL
ALBUMIN: 3.6 (ref 3.5–5.0)
ALK PHOSPHATASE: 78 (ref 44–150)
ALT: 16 (ref 0–34)
AST: 19 (ref 0–34)
BLD UREA NITROGEN: 64 — ABNORMAL HIGH (ref 9–23)
CALCIUM: 9 (ref 8.3–10.6)
CHLORIDE: 103 — ABNORMAL LOW (ref 98–109)
CO2: 35 — ABNORMAL HIGH (ref 20–31)
CREATININE: 2.4 — ABNORMAL HIGH (ref 0.55–1.02)
GFR ESTIMATED: 20 — ABNORMAL LOW
GLUCOSE,PANEL: 128 — ABNORMAL HIGH (ref 70–100)
POTASSIUM: 2.8 — ABNORMAL LOW (ref 3.4–5.1)
SODIUM: 145 — ABNORMAL HIGH (ref 136–145)
TOTAL BILIRUBIN: 0.6 (ref 0.2–1.1)
TOTAL PROTEIN: 5.3 — ABNORMAL LOW (ref 5.7–8.2)

## 2022-12-07 LAB — CBC AND DIFF
HEMATOCRIT: 34 — ABNORMAL LOW (ref 36–45)
RBC COUNT: 3.4 — ABNORMAL LOW (ref 4.00–5.00)
WBC COUNT: 6.8 (ref 4.00–11.00)

## 2022-12-07 LAB — MAGNESIUM: MAGNESIUM: 1.8 — ABNORMAL LOW (ref 1.6–2.6)

## 2022-12-08 ENCOUNTER — Encounter: Admit: 2022-12-08 | Discharge: 2022-12-08 | Payer: MEDICARE

## 2022-12-08 DIAGNOSIS — I2510 Atherosclerotic heart disease of native coronary artery without angina pectoris: Secondary | ICD-10-CM

## 2022-12-09 ENCOUNTER — Encounter: Admit: 2022-12-09 | Discharge: 2022-12-09 | Payer: MEDICARE

## 2022-12-09 NOTE — Telephone Encounter
Received call from [facility] reporting pts labs have been entered but wanted to confirm HF provider saw that pts potassium was 2.8.

## 2022-12-09 NOTE — Telephone Encounter
Called and spoke with patient. She reports that she did not take the extra potassium as instructed on Friday. Reviewed with BK, she still recommends that patient proceed with plan to take an additional 60 mEq today. Patient confirms plan.    Called and spoke with patient son, [name]. Reviewed plan with him. He reports that they will be getting chemo tomorrow and ask that clinic to fax [place] labs.

## 2022-12-09 NOTE — Telephone Encounter
[name], APRN-NP sent to P Cvm Nurse Hf Team [facility]  Thank you, and no more metolazone.  Her creatinine is up a bit.

## 2022-12-10 ENCOUNTER — Encounter: Admit: 2022-12-10 | Discharge: 2022-12-10 | Payer: MEDICARE

## 2022-12-10 LAB — POTASSIUM: POTASSIUM: 4 % — ABNORMAL HIGH (ref 3.6–4.7)

## 2022-12-11 ENCOUNTER — Encounter: Admit: 2022-12-11 | Discharge: 2022-12-11 | Payer: MEDICARE

## 2022-12-11 DIAGNOSIS — I2510 Atherosclerotic heart disease of native coronary artery without angina pectoris: Secondary | ICD-10-CM

## 2022-12-14 ENCOUNTER — Encounter: Admit: 2022-12-14 | Discharge: 2022-12-14 | Payer: MEDICARE

## 2022-12-14 ENCOUNTER — Ambulatory Visit: Admit: 2022-12-14 | Discharge: 2022-12-15 | Payer: MEDICARE

## 2022-12-14 DIAGNOSIS — C55 Malignant neoplasm of uterus, part unspecified: Secondary | ICD-10-CM

## 2022-12-14 DIAGNOSIS — M549 Dorsalgia, unspecified: Secondary | ICD-10-CM

## 2022-12-14 DIAGNOSIS — E785 Hyperlipidemia, unspecified: Secondary | ICD-10-CM

## 2022-12-14 DIAGNOSIS — G4730 Sleep apnea, unspecified: Secondary | ICD-10-CM

## 2022-12-14 DIAGNOSIS — Z7901 Long term (current) use of anticoagulants: Secondary | ICD-10-CM

## 2022-12-14 DIAGNOSIS — R570 Cardiogenic shock: Secondary | ICD-10-CM

## 2022-12-14 DIAGNOSIS — C801 Malignant (primary) neoplasm, unspecified: Secondary | ICD-10-CM

## 2022-12-14 DIAGNOSIS — I493 Ventricular premature depolarization: Secondary | ICD-10-CM

## 2022-12-14 DIAGNOSIS — E119 Type 2 diabetes mellitus without complications: Secondary | ICD-10-CM

## 2022-12-14 DIAGNOSIS — Z95810 Presence of automatic (implantable) cardiac defibrillator: Secondary | ICD-10-CM

## 2022-12-14 DIAGNOSIS — J45909 Unspecified asthma, uncomplicated: Secondary | ICD-10-CM

## 2022-12-14 DIAGNOSIS — I519 Heart disease, unspecified: Secondary | ICD-10-CM

## 2022-12-14 DIAGNOSIS — Z955 Presence of coronary angioplasty implant and graft: Secondary | ICD-10-CM

## 2022-12-14 DIAGNOSIS — H547 Unspecified visual loss: Secondary | ICD-10-CM

## 2022-12-14 DIAGNOSIS — J449 Chronic obstructive pulmonary disease, unspecified: Secondary | ICD-10-CM

## 2022-12-14 DIAGNOSIS — I5022 Chronic systolic (congestive) heart failure: Secondary | ICD-10-CM

## 2022-12-14 DIAGNOSIS — I5043 Acute on chronic combined systolic (congestive) and diastolic (congestive) heart failure: Secondary | ICD-10-CM

## 2022-12-14 DIAGNOSIS — F419 Anxiety disorder, unspecified: Secondary | ICD-10-CM

## 2022-12-14 DIAGNOSIS — I2510 Atherosclerotic heart disease of native coronary artery without angina pectoris: Secondary | ICD-10-CM

## 2022-12-14 DIAGNOSIS — Z9989 Dependence on other enabling machines and devices: Secondary | ICD-10-CM

## 2022-12-14 DIAGNOSIS — R609 Edema, unspecified: Secondary | ICD-10-CM

## 2022-12-14 DIAGNOSIS — Z79899 Other long term (current) drug therapy: Secondary | ICD-10-CM

## 2022-12-14 DIAGNOSIS — I10 Essential (primary) hypertension: Secondary | ICD-10-CM

## 2022-12-14 DIAGNOSIS — I6521 Occlusion and stenosis of right carotid artery: Secondary | ICD-10-CM

## 2022-12-14 DIAGNOSIS — N183 Chronic kidney disease, stage 3 unspecified (HCC): Secondary | ICD-10-CM

## 2022-12-14 DIAGNOSIS — M1990 Unspecified osteoarthritis, unspecified site: Secondary | ICD-10-CM

## 2022-12-14 DIAGNOSIS — R1900 Intra-abdominal and pelvic swelling, mass and lump, unspecified site: Secondary | ICD-10-CM

## 2022-12-14 DIAGNOSIS — I6529 Occlusion and stenosis of unspecified carotid artery: Secondary | ICD-10-CM

## 2022-12-14 NOTE — Progress Notes
Date of Service: 12/14/2022      HPI       I had the pleasure of seeing Raven [name], who is accompanied by her son in the [facility] today for heart failure follow-up. Patient follows with Dr. Vanetta Shawl    Patient is a 77 y.o. 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       She was recently hospitalized at [facility] from 7/7 through 7/16 with a 4 pound weight gain since last office visit with me.  Her milrinone dose was increased to 0.3 mics per kilogram per minute, device interrogation showed ERI, EP was consulted with recommended upgrade of her ICD to CRT-D.  She had leukocytosis, ID was consulted and had no contraindications for GEN change and device upgrade.  She was transition to torsemide 40 mg daily in addition she also had significant anemia with a hemoglobin of 6.6 requiring blood transfusion.  On 7/15 her CBC for her milrinone became dislodged, she underwent replacement in interventional radiology on 7/16.  She was recommended to have outpatient follow-up with GI for EGD/EUS with polypectomy for gastric and duodenal polyps and needs an incision check .     She was last seen in clinic on 8/5 by Ivory Broad APRN, at that office visit she had recommended a 5 mg metolazone with extra potassium and repeat chemistry.    TODAY'S VISIT      Raven [name], a patient with a history of heart failure and myeloma, presents for a routine follow-up. The patient reports no recent issues with breathing, coughing, chest pain, or pressure. She denies any swelling in her ankles. The patient's weight has been stable, with a slight decrease noted.    The patient has been managing her heart failure with metoprolol and torsemide, and her myeloma with Darzalex. She also takes metolazone as needed for fluid retention, but it is not used daily. The patient's hemoglobin levels have improved from 7.4 to 10.4, indicating a positive response to her myeloma treatment.    The patient has been using a motorized scooter for mobility and has a chairlift installed at home to assist with stairs. She reports no issues with mobility around the house. The patient also receives weekly home health visits for dressing changes related to her milrinone infusion.    The patient's overall condition appears to be stable with no new or worsening symptoms reported.            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 09/19/2022    Mitral valve insufficiency 09/19/2022    Abnormal finding on imaging 09/18/2022    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    Gastric polyp 09/18/2022    Polyp of duodenum 09/18/2022    Diverticulitis 08/10/2022    History of MI (myocardial infarction) 05/21/2022    Acute cystitis without hematuria 03/18/2022    Colitis 03/18/2022    Compression fracture of L1 lumbar vertebra (HCC) 03/18/2022    Leukocytosis 03/18/2022    Intractable low back pain 03/17/2022    Chronic kidney disease, 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/2022    Hypokalemia 11/25/2021    S/P drug eluting coronary stent placement 08/21/2021    Benign essential hypertension 08/21/2021    Left carotid stenosis 08/21/2021    Malignant neoplasm of uterus (HCC) 08/21/2021    Obstructive sleep apnea syndrome 08/21/2021    Coronary artery disease involving native coronary artery of native heart without angina pectoris 08/12/2021    Cardiomyopathy, ischemic 08/12/2021    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    Multiple myeloma (HCC) 01/05/2020    Type 2 diabetes mellitus without complications (HCC) 12/06/2018    Chronic obstructive pulmonary disease, unspecified (HCC) 12/06/2018    Dyslipidemia 12/06/2018       ROS:See HPI      Physical Exam  General Appearance: In NAD, pale  Neck Veins: normal JVP, neck veins are not distended; no HJR   Chest Inspection: right SCL dressing CDI, slight redness noted at insertion site, no drainage or palpable cord noted  Respiratory Effort: breathing comfortably, no respiratory distress   Auscultation/Percussion: lungs clear to auscultation, no rales, rhonchi or wheezing   Cardiac Rhythm: regular rhythm and tachycardic rate   Cardiac Auscultation: S1, S2 normal, no rub, no definite S3  or S4   Murmurs: no murmur   Peripheral Circulation: normal peripheral circulation   Pedal Pulses: normal symmetric pedal pulses   Lower Extremity Edema: 1+ right lower extremity edema   Abdominal Exam: soft, non-tender, no obvious masses, bowel sounds normal   Gait & Station: In a wheelchair today  Orientation: oriented to person, place and time   Affect & Mood: appropriate and sustained affect   Language and Memory: patient responsive and seems to comprehend information   Neurologic Exam: neurological assessment grossly intact   Vital signs were reviewed      Cardiovascular Studies:Echocardiographic Findings 09/18/2022   Left Ventricle The left ventricular wall thickness is normal. Normal geometry. The left ventricular systolic function is moderately reduced. The visually estimated ejection fraction is 30%. Unable to assess left ventricular diastolic function. Unable to assess left atrial pressure.   Right Ventricle The right ventricle is mildly dilated. The right ventricular systolic function is mildly reduced. Pacemaker lead present in the ventricle.   Left Atrium Mildly dilated.   Right Atrium Not well seen. Normal size. Pacemaker lead present in the right atrium.   IVC/SVC Inferior vena cava was not well seen; right atrial pressure could not be estimated.   Mitral Valve No stenosis. Moderate to severe regurgitation. There is mild mitral annular calcification without stenosis.   Tricuspid Valve Valve has a dilated annulus. No stenosis. Severe regurgitation.   Aortic Valve The aortic valve was not well seen. Trace regurgitation.   Pulmonary The pulmonic valve was not seen well but no Doppler evidence of stenosis.   Aorta The ascending aorta is mildly dilated.   Pericardium No pericardial effusion.  5/24 RHC RA 13, RV 70/12, PA 72/27/47, PCWP 17, CO 1.9, CI 1.1 via FICK, CO 2.5, CI 1.4 via thermo with PA sat 41%.      Gastric biopsy 09/17/22:  Oxyntic-type mucosa with focally active gastritis.   Antral-type mucosa with features of reactive gastropathy.   Immunohistochemical stain for H. pylori is negative.      Problems Addressed Today  No diagnosis found.             Assessment/Plan:  HFrEF due to ICM  The most recent echo as noted above.    The patient today describes NYHA Functional Class III, stage C symptoms.    >Diuretic Therapy   Current Dose:  torsemide 40mg  twice daily with as needed metolazone 5 mg Changes:        She appears euvolemic today, clinic weight is 164 pounds, dry weight has been 165 pounds..  I reviewed most recent labs and most recent cardiac testing with patient and family.  Shared medical decision making involves eliciting patient and/or family preferences, education and explaining risks and benefits of management options.  HF Therapy:   GDMT Current Dose Changes made at visit   BB Toprol-XL 37.5 mg daily    ACEI/ARB/ARNI None, CKD Aldosterone antagonist None, CKD    SGLT2i None PTA, at next office visit can consider addition of this    Hydralazine/Nitrate Not applicable, on inotropes    Ivabradine Not applicable    Cardiac rehab Not applicable    Advanced Therapies Candidacy/Contraindications   N/A            Ambulatory cardiogenic shock: She underwent right heart cath on 09/18/2022 that showed cardiac index of 1.1, she was started on milrinone, currently on 0.30 mcg/kg/min    CRT-D in situ: She underwent GEN change with Dr. Venita Lick, she did not meet criteria for upgrade to CRT-D since the QRS duration was less than 0.12.    Multiple myeloma: Follows at [facility], was seen during this admission by Dr. Mitchell Heir.    She has chronic anemia, her hemoglobin on 7/13 was 6.6, she did have a unit of blood, repeat CBC on 7/22 showed hemoglobin 7.4  -She is scheduled to see GI in November.  She reports that she will be seeing oncology and she usually gets a medication that stimulates RBC production    -She follows with  [facility] oncology she was given Darzalex subcutaneous as well as Retacrit.     -  Lab Results   Component Value Date    KAPPAFLC 1.65 09/19/2022    LAMBDAFLC 0.81 09/19/2022    FREELCRAT 2.04 (H) 09/19/2022    IMMUNOFIXS  09/19/2022     NO PARAPROTEIN SEEN  CONFIRMED BY IMMUNOFIXATION GEL      NTPROBNP 14,358.0 (H) 11/01/2022    NTPROBNP 13,442.0 (H) 11/01/2022    NTPROBNP 5,573.0 (H) 10/23/2022      Atrial fibrillation; last EKG on 6/5 showed sinus tachycardia with PVCs.  - Rates 90's -100's  > Continue Eliquis 5mg  BID   > Toprol as above   > If rate uncontrolled could consider AVN ablation in future          CAD s/p CABG x3 '10, PCI in '10  HLD  Carotid artery disease with prior endarterectomy '16  - PTA Plavix s/p CEA, this was stopped 09/21/22 and started on bASA so intervention by GI could be performed in outpt setting       COPD   OSA with CPAP  use  - PTA Proair    Hx diverticulosis s/p sigmoid colon mass resection 08/10/22  Pancreatic cysts  Fat stranding at gastric outlet/proximal duodenum  - Recent surgery with surgical oncology for colonic mass resection, found to be diverticulitis. No signs of malignancy.  - BC on admission Negative   - CT abd/pelv 5/22: RUQ fat abutting the gastric outlet, possibly gastritis or duodenitis. Several cystic lesions in tail of pancreas  - EGD 5/23: normal esophagus, 3cm hiatal hernia, congestive gastropathy, erythematous mucosa biopsied in prepyloric region, 6mm sessile polyp in gastric  body, 2 polyps in duodenum, poylps not removed due to Plavix taken 5/21  - No surgical intervention per surgical oncology    -Will see GI in November       CKD stage III:  Lab Results   Component Value Date    CR 2.44 (H) 12/07/2022    CR 2.02 (H) 11/23/2022    CR 2.1 (H) 11/16/2022           Diabetes Mellitus Type II- Managed by Primary team.    Lab Results   Component Value Date/Time    HGBA1C 8.0 (H) 09/16/2022 07:02 PM    HGBA1C 7.4 (H) 07/14/2022 02:44 PM         Lab Results   Component Value Date    CHOL 107 09/16/2022    TRIG 72 09/16/2022    HDL 50 09/16/2022    LDL 45 09/16/2022    VLDL 14 09/16/2022    NONHDLCHOL 57 09/16/2022       Follow up appointment:   she will see Dr. [name] on 9/9,    I have personally documented the HPI, exam and medical decision making.  Patient education: I reviewed recent lab results and current medications, medication instructions, discussed heart failure signs & symptoms,  low  sodium diet, fluid restriction and daily weights.I have instructed the patient on the plan of care and they verbalize understanding of the plan. Please see AVS for full patient teaching. Patient advised to call our office if s/he has any problems, questions, worsening symptoms, or concerns prior to the next appointment.   Thanks for allowing me to see this nice patient. If I can be of additional assistance, please don't hesitate to contact me.     Current Outpatient Medications on File Prior to Visit   Medication Sig Dispense Refill    acetaminophen (TYLENOL) 325 mg tablet Take three tablets by mouth every 8 hours as needed for Pain. Indications: pain 40 tablet 0    acyclovir (ZOVIRAX) 400 mg tablet Take one tablet by mouth twice daily.      albuterol sulfate (PROAIR HFA) 90 mcg/actuation HFA aerosol inhaler Inhale two puffs by mouth into the lungs every 6 hours as needed for Wheezing or Shortness of Breath.      amiodarone (CORDARONE) 200 mg tablet Take two tablets by mouth twice daily for through 11/11/22, THEN one tablet daily thereafter starting 11/12/22. Continue on this dose until follow up. Indications: prevention of recurrent atrial fibrillation 40 tablet 0    apixaban (ELIQUIS) 5 mg tablet Take one tablet by mouth twice daily. Indications: treatment to prevent blood clots in chronic atrial fibrillation 180 tablet 0    aspirin 81 mg chewable tablet Chew one tablet by mouth daily. Indications: stroke prevention      budesonide-formoterol HFA (SYMBICORT) 160-4.5 mcg/actuation aerosol inhaler Inhale two puffs by mouth into the lungs twice daily. Indications: bronchospasm prevention with COPD 30.6 g 0  cholecalciferol (VITAMIN D) 1,000 units tablet Take one tablet by mouth daily.      cyanocobalamin (VITAMIN B-12) 1,000 mcg tablet Take 5,000 Units by mouth daily.      dexAMETHasone (DECADRON) 4 mg tablet TAKE 5 TABLETS(20 MG) BY MOUTH WEEKLY      dicyclomine (BENTYL) 10 mg capsule Take one capsule by mouth daily.      EMU OIL (BULK) MISC Use  as directed. Apply topically as needed      febuxostat (ULORIC) 40 mg tablet Take one-half tablet by mouth daily. Indications: taken on chemo days 45 tablet 0    ferrous sulfate (FEOSOL) 325 mg (65 mg iron) tablet Take one tablet by mouth daily. Take on an empty stomach at least 1 hour before or 2 hours after food.      insulin aspart (U-100) (NOVOLOG FLEXPEN U-100 INSULIN) 100 unit/mL (3 mL) PEN Inject  under the skin. Per sliding scale ( she doesn't have sheet with her)      insulin detemir U-100 (LEVEMIR FLEXTOUCH) 100 unit/mL (3 mL) injection pen Inject twenty five Units under the skin at bedtime daily.      metOLazone (ZAROXOLYN) 5 mg tablet Take one tablet by mouth daily as needed.      metoprolol succinate XL (TOPROL XL) 25 mg extended release tablet Take 1.5 tablets by mouth daily. Indications: chronic heart failure, ventricular rate control in atrial fibrillation 90 tablet 1    milrinone (PRIMACOR) 200 mcg/mL infusion Administer 22.68 mcg/min through vein Continuous. Indications: sudden and serious symptoms of heart failure called acute decompensated heart failure      nitroglycerin (NITROSTAT) 0.4 mg tablet Place one tablet under tongue every 5 minutes as needed for Chest Pain.      ONETOUCH VERIO TEST STRIPS test strip USE STRIP TO CHECK GLUCOSE TWICE DAILY FOR DIABETES MELLITUS      pantoprazole DR (PROTONIX) 40 mg tablet Take one tablet by mouth daily. Indications: gastritis 90 tablet 1    potassium chloride SR (K-DUR) 20 mEq tablet Take 1.5 tablets by mouth daily. Indications: prevention of low potassium in the blood 140 tablet 3    prochlorperazine maleate (COMPAZINE) 10 mg tablet Take one tablet by mouth every 6 hours as needed.      rosuvastatin (CRESTOR) 10 mg tablet Take one tablet by mouth daily. Indications: hardening of the arteries due to plaque buildup 90 tablet 1    sertraline (ZOLOFT) 50 mg tablet Take one tablet by mouth daily.      torsemide (DEMADEX) 20 mg tablet Take two tablets by mouth twice daily. Indications: accumulation of fluid resulting from chronic heart failure 360 tablet 3     No current facility-administered medications on file prior to visit.       [name] AGPCNP-BC, CHFN,HF-Cert  Advanced Heart Failure APP  [facility]  Collaborating physician: Dr. Vanetta Shawl      Plan:  The following is a copy of the written instructions and plan I gave to the patient during her office visit.    Patient Instructions   Thank you for choosing the [facility].   Your Care Plan:   Medications:   Labs:   Test:   Follow up appointment     If you are being seen in the Heart Failure clinic:   Follow a 2000 mg sodium diet and 2-liter fluid restriction.    Call for any worsening symptoms of shortness of breath, swelling, sudden  weight gain, lightheadedness, heart racing, or chest pain.  Weigh yourself daily and call us if your weight increase is greater than 3 pounds overnight OR 5 pounds in one week.   It is helpful for you to bring your list of current medications with you to all clinic visit.     Contacting our office:     -Business Hours: Monday-Friday, 8:00 am-4:30 pm (excluding Holidays).      -For medical questions or concerns, please send [place] a message through your MyChart account or call the Advance Heart Failure team nursing triage line at [phone]. Please leave a detailed message with your name, date of birth, and reason for your call.  If your message is received before 3:30pm, every effort will be made to call you back the same day.  Please allow time for [place] to review your chart prior to call back.      -For medication refills please start by contacting your pharmacy. You can also send [place] a prescription question through your MyChart or call the nurse triage line above.      -Should you have an immediate need of the weekend/nights and holidays, please call our on-call triage line at [phone].    -To schedule any follow up appointments or to change an existing appointment please contact our scheduling department at [phone].     Below are [facility] outpatient lab locations that do not require an appointment. You can walk in during the times below:     [facility] ([address] [place], [place], [place]): M-F 7:30am-4:30pm; Avoid noon-1pm   [facility] ([address] [place], [place]): M-F 7a-6p; Saturday 7a-12p   [facility] ([address], [place], [place]) M-F 7:30a-5:30p;  Sat and Sun 7a-12p   [facility] Outpatient lab ([address], [place], [place]) M-F 8a-5p   [facility] ([address] [place], [place], [place]) M-F 8a-4:30p  [facility] ([address] [place], [place]) M-F 8a-4p  Jeanell Sparrow: [address] [place], [place] [address] Phone [phone] M-T 7a-6p F: [phone] -4p    Team [place] Providers:                                                                                                                                    Dr. Vanetta Shawl, MD  Dr. Lyndel Safe, MD   [name], AGCNP-BC, [facility], HF-Cert                                   Ivory Broad, APRN, ANP-C, [facility]  Inge Rise, ANP-C                                                 Nurses:   Laurina Bustle  [place] Poore  [facility]  [address] [place], [place] [address]  Phone: [phone] Fax: [phone]    At the [facility], you and your family are our top priority.  You may receive a survey via email or text message that we are asking you to complete.  We value your feedback to ensure you are satisfied with every visit and we are continually providing the highest quality of patient care.  We know your time is valuable and thank you in advance for completing the survey.                             Future Appointments   Date Time Provider Department Center   01/04/2023  3:00 PM Arneta Cliche, MD [facility] CVM Exam

## 2022-12-14 NOTE — Telephone Encounter
Pt called reporting she does not have amiodarone and is not sure when the last time she took it was. Pt seen today by APRN, asks for new prescription if she is to continue amiodarone.

## 2022-12-15 ENCOUNTER — Encounter: Admit: 2022-12-15 | Discharge: 2022-12-15 | Payer: MEDICARE

## 2022-12-15 DIAGNOSIS — G4733 Obstructive sleep apnea (adult) (pediatric): Secondary | ICD-10-CM

## 2022-12-15 DIAGNOSIS — E1122 Type 2 diabetes mellitus with diabetic chronic kidney disease: Secondary | ICD-10-CM

## 2022-12-15 DIAGNOSIS — I4891 Unspecified atrial fibrillation: Secondary | ICD-10-CM

## 2022-12-15 DIAGNOSIS — I255 Ischemic cardiomyopathy: Secondary | ICD-10-CM

## 2022-12-15 DIAGNOSIS — Z951 Presence of aortocoronary bypass graft: Secondary | ICD-10-CM

## 2022-12-15 DIAGNOSIS — R000 Tachycardia, unspecified: Secondary | ICD-10-CM

## 2022-12-15 MED ORDER — AMIODARONE 200 MG PO TAB
200 mg | ORAL_TABLET | Freq: Every day | ORAL | 1 refills | 42.00000 days | Status: AC
Start: 2022-12-15 — End: ?

## 2022-12-15 NOTE — Telephone Encounter
Call to pt no answer LM Amiodarone refilled at her preferred pharmacy.  [facility]

## 2022-12-17 ENCOUNTER — Encounter: Admit: 2022-12-17 | Discharge: 2022-12-17 | Payer: MEDICARE

## 2022-12-17 NOTE — Telephone Encounter
Pt also called reporting HHRN will see her today at 1630.

## 2022-12-17 NOTE — Telephone Encounter
[address] [facility] Aundra Millet [phone] called reporting she has spoken with pt and will see ger later today to assess line.

## 2022-12-17 NOTE — Telephone Encounter
Received call from [facility] pharmacist Frazier Butt reporting pt is having some down stream occlusions and infusion was alarming last night. Raynelle Fanning believes pt will need cathflo and is doubtful this will covered in the home so will need to have this done at infusion center. Call back ext [phone]

## 2022-12-17 NOTE — Telephone Encounter
Called and spoke with PharmD Raynelle Fanning, she reports patient needs Cathflow in center so insurance would cover. Reviewed with Infusion RN, noted patient has HH though [facility] not [facility]. Called Raynelle Fanning back to clarify if the [facility] had attempted to troubleshoot like, Raynelle Fanning states she left a message for [facility] to troubleshoot line yesterday, however she never heard back from [facility]. Called [facility] Megan at [phone], had to leave a message, asked [facility] to see patient today to troubleshoot alerts and line. Asked her for a call back to HF triage with update after she saw pt today.     Called and spoke with patient and son. Son reports that line has been alerting around 4 AM for the last two days

## 2022-12-21 ENCOUNTER — Encounter: Admit: 2022-12-21 | Discharge: 2022-12-21 | Payer: MEDICARE

## 2022-12-22 ENCOUNTER — Encounter: Admit: 2022-12-22 | Discharge: 2022-12-22 | Payer: MEDICARE

## 2022-12-22 DIAGNOSIS — I5022 Chronic systolic (congestive) heart failure: Secondary | ICD-10-CM

## 2022-12-22 NOTE — Progress Notes
Amiodarone Monitoring 12/22/22:     [facility] Protocol:  Due at baseline and every 6 months:  Labs: ALT, AST, TSH.    Due at baseline and every 12 months: K, Mg, Office Visit, ECG(can use V rate for HR), Chest Imaging (CXR, PFT or high resolution CT), BP and an eye exam.     Amiodarone status:  Amiodarone monitoring completed.  Next amiodarone review is due in 180 days.    Most recent test results (every 6 months: AST, ALT, TSH w Free T4 within normal limits. Every 12 months: Potassium, Magnesium)  Lab Results   Component Value Date/Time    AST 18 12/22/2022 12:00 AM    ALT 19 12/22/2022 12:00 AM    TSH 0.57 09/18/2022 09:25 PM    K 3.4 12/22/2022 12:00 AM    MG 1.8 12/07/2022 12:00 AM       BP:   BP Readings from Last 1 Encounters:   12/14/22 114/67       ECG:   Most recent results for 12-Lead ECG   ECG 12-LEAD    Collection Time: 11/05/22  4:46 AM   Result Value Status    VENTRICULAR RATE 96 Final    P-R INTERVAL 178 Final    QRS DURATION 108 Final    Q-T INTERVAL 370 Final    QTC CALCULATION (BAZETT) 467 Final    P AXIS 22 Final    R AXIS -10 Final    T AXIS -41 Final    Impression    Sinus rhythm with premature atrial complexes  Minimal voltage criteria for LVH, may be normal variant ( Cornell product )  Nonspecific T wave abnormality  Abnormal ECG  When compared with ECG of 05-Nov-2022 04:36, (Unconfirmed)  No significant change was found  Confirmed by Modena Jansky ([id number]) on 11/05/2022 8:20:47 AM   [facility] MAIN ECG TRIAGE ONLY    Collection Time: 11/01/22  4:29 PM   Result Value Status    VENTRICULAR RATE 109 Final    P-R INTERVAL 208 Final    QRS DURATION 106 Final    Q-T INTERVAL 342 Final    QTC CALCULATION (BAZETT) 460 Final    P AXIS 40 Final    R AXIS -16 Final    T AXIS 139 Final    Impression    Sinus tachycardia with premature atrial complexes  T wave abnormality, consider lateral ischemia  Abnormal ECG  When compared with ECG of 30-Sep-2022 13:35,  premature ventricular complexes are no longer present  premature atrial complexes are now present  Confirmed by Roseanne Reno, Mojibade ([id number]) on 11/01/2022 7:12:41 PM       CXR, PFT or high resolution CT: 11/05/2022    Last Cardiology OV - 12/14/2022    Eye exam - unknown     Provider notified of abnormal results? Yes

## 2022-12-23 ENCOUNTER — Encounter: Admit: 2022-12-23 | Discharge: 2022-12-23 | Payer: MEDICARE

## 2022-12-23 MED ORDER — POTASSIUM CHLORIDE 20 MEQ PO TBTQ
20 meq | ORAL_TABLET | Freq: Two times a day (BID) | ORAL | 3 refills | 30.00000 days | Status: AC
Start: 2022-12-23 — End: ?

## 2022-12-23 NOTE — Telephone Encounter
Pt id confirmed using 2 identifiers. Discussed lab work with pt and increased KCL per orders. Instructions repeated back and VU of info.

## 2022-12-23 NOTE — Telephone Encounter
-----   Message from India Hook M sent at 12/23/2022  7:26 AM CDT -----  Her sodium is quite increased today to 148, potassium is lower at 3.4, CBC is stable.  I am not sure if the hyponatremia is related to her Decadron that she takes with chemotherapy?  Please make sure her oncologist is getting those labs as well.  For her potassium she is taking 30 mEq daily, please increase that to 20 mEq p.o. twice daily.  Thanks, DM

## 2022-12-23 NOTE — Telephone Encounter
Received notification that  [employer] has not been connected since 12/11/22. Patient was instructed to look at his/her transmitter to make sure that it is plugged into power and send a manual transmission to reconnect the transmitter. If he/she has any questions about how to send a transmission or if the transmitter does not appear to be working properly, they need to contact the device company directly. Patient was provided with that contact number. Requested the patient send [place] a MyChart message or contact our device nurses at [phone] to let us know after they have sent their transmission. Placed call to preferred phone number got no answer LVM. CDJ   Note: Patient needs to send a manual remote interrogation to reestablish communication to his/her remote transmitter.

## 2022-12-24 ENCOUNTER — Encounter: Admit: 2022-12-24 | Discharge: 2022-12-24 | Payer: MEDICARE

## 2022-12-25 ENCOUNTER — Encounter: Admit: 2022-12-25 | Discharge: 2022-12-25 | Payer: MEDICARE

## 2022-12-29 ENCOUNTER — Encounter: Admit: 2022-12-29 | Discharge: 2022-12-29 | Payer: MEDICARE

## 2022-12-30 ENCOUNTER — Encounter: Admit: 2022-12-30 | Discharge: 2022-12-30 | Payer: MEDICARE

## 2022-12-30 ENCOUNTER — Ambulatory Visit: Admit: 2022-12-30 | Discharge: 2022-12-31 | Payer: MEDICARE

## 2022-12-30 DIAGNOSIS — Z955 Presence of coronary angioplasty implant and graft: Secondary | ICD-10-CM

## 2022-12-30 DIAGNOSIS — I2510 Atherosclerotic heart disease of native coronary artery without angina pectoris: Secondary | ICD-10-CM

## 2022-12-30 DIAGNOSIS — R570 Cardiogenic shock: Secondary | ICD-10-CM

## 2022-12-30 DIAGNOSIS — I5022 Chronic systolic (congestive) heart failure: Secondary | ICD-10-CM

## 2022-12-30 MED ORDER — ALTEPLASE 2 MG IK SOLR
2 mg | INTRAMUSCULAR | 0 refills | Status: DC | PRN
Start: 2022-12-30 — End: 2022-12-30

## 2022-12-30 MED ORDER — ALTEPLASE 2 MG IK SOLR
2 mg | INTRAMUSCULAR | 0 refills | Status: CN | PRN
Start: 2022-12-30 — End: ?

## 2022-12-30 NOTE — Telephone Encounter
Return call to Frazier Butt LM on VM.  Called HH infusion main [phone]  Spoke to Frazier Butt  Pump with down stream infusion alarms  2-3 weeks ago trying to do TPA  Pump started working after [employer] @ [employer] worked with line  Then pt received new pump worked ok yesterday then at   10 pm Mon 4pm Tues nothing infusing.  Raynelle Fanning states this is a continuous  intermittent event.    When Milronone was not running for 15 hrs pt had no symptoms of SOA, or wt gain.    Call to infusion @ [facility] states can give alteplase @ 1200 today .    Call to Frazier Butt [facility] infusion [phone] with update.    Call to pt Call to pt verify Identity  pt states name and DOB  Advise pt appt @ 1200 if ok to unblock line in [facility] -pt verb understanding thanks CNC for setting up

## 2022-12-30 NOTE — Progress Notes
Patient arrived to HF infusion clinic for assessment of CVC with potential alteplase administration.     Line flushed easily with immediate brisk blood return. No alteplase needed.     Patient stating her pump continues to alarm off and on throughout the day/night. No kinks in line. She states [facility] recently provided her with a new pump, but she has not been able to switch her medication over yet.     Patient very frustrated and tearful and does not understand what the problem is.     This RN contacted [facility] to see if they would be able to come assess the line. Raynelle Fanning states there is no one available at this time, but they plan to call the patient's [facility] later today to discuss troubleshooting tips to hopefully prevent another clinic visit.     Patient updated on POC, verbalized understanding. Denies any further questions.

## 2022-12-30 NOTE — Telephone Encounter
Raven [name] with [name] left message about patients milrinone pump alarming for the past 3-4 weeks, pump was changed out and was working but alarmed again last night. Raven [name] stated she also emailed Raven [name] about this yesterday, but left message just in case Raven [name] was off today. Pharmacy call back # is [phone], option 3.

## 2022-12-30 NOTE — Telephone Encounter
Frazier Butt called again, updating that she has a call in to [facility]. They are supposed to go see the patient today. Raynelle Fanning would like a call back to her desk phone, call back # is [phone].

## 2022-12-31 ENCOUNTER — Encounter: Admit: 2022-12-31 | Discharge: 2022-12-31 | Payer: MEDICARE

## 2022-12-31 DIAGNOSIS — I2510 Atherosclerotic heart disease of native coronary artery without angina pectoris: Secondary | ICD-10-CM

## 2022-12-31 NOTE — Progress Notes
From Frazier Butt Clinical pharmacist    Just wanted to give you an update on our mutual patient Raven [name] dob 01/25/2046.  I am looping in patient's regular HH RN Kipp Laurence with [facility].  Update today 12/31/22:  -Milrinone fusion seems to be running okay for now.  -We ([facility]) did get a call last night that when patient and son were changing her bag and disconnected the completed bag blood started ?spurting out? of the line.  [facility] RN nurse was alerted and she determined the ultrasite (injection cap) had been removed accidentally along with the infusion bag and the line was unclamped leading to the bleeding.  [facility] RN tried to talk them through putting the ultrasite back on but it was stuck to the bag.  She ended up going to patient's home to assist get cap placed back on line.  -I called Kipp Laurence today to update her on events from the past few days.  As she is familiar with patient she reported:  -Her assessment is that there might be a problem with the positioning of the CVC.  Patient's current CVC  has had intermittent problems with pump alarms/downstream occlusions ever since patient had it placed during most recent hospitalization. Per chart review on 11/10/22 patient had line replaced at [facility] ?Successful right IJ CVC removal and placement?Aundra Millet has thoroughly troubleshooted the problem a number of times and has never found a problem with kinks or closed clamps.    -She has been able to flush it easily in the past too.  -The pump has been replaced/changed out at least twice.    -Per reports from [facility] and patient and son it seems like the problem usually self corrects.  -Aundra Millet was going to see patient today to draw labs as they have not been drawn this week but patient was not available for home visit as she had an appointment at [facility] in [facility] for an injection related to her cancer treatment.  I checked Care Everywhere and there are lab results for today but only resulted ones so far are CBC.

## 2023-01-01 ENCOUNTER — Encounter: Admit: 2023-01-01 | Discharge: 2023-01-01 | Payer: MEDICARE

## 2023-01-04 ENCOUNTER — Encounter: Admit: 2023-01-04 | Discharge: 2023-01-04 | Payer: MEDICARE

## 2023-01-04 ENCOUNTER — Ambulatory Visit: Admit: 2023-01-04 | Discharge: 2023-01-04 | Payer: MEDICARE

## 2023-01-04 DIAGNOSIS — I2510 Atherosclerotic heart disease of native coronary artery without angina pectoris: Secondary | ICD-10-CM

## 2023-01-04 DIAGNOSIS — I6521 Occlusion and stenosis of right carotid artery: Secondary | ICD-10-CM

## 2023-01-04 DIAGNOSIS — I519 Heart disease, unspecified: Secondary | ICD-10-CM

## 2023-01-04 DIAGNOSIS — R1900 Intra-abdominal and pelvic swelling, mass and lump, unspecified site: Secondary | ICD-10-CM

## 2023-01-04 DIAGNOSIS — Z955 Presence of coronary angioplasty implant and graft: Secondary | ICD-10-CM

## 2023-01-04 DIAGNOSIS — E119 Type 2 diabetes mellitus without complications: Secondary | ICD-10-CM

## 2023-01-04 DIAGNOSIS — H547 Unspecified visual loss: Secondary | ICD-10-CM

## 2023-01-04 DIAGNOSIS — M1990 Unspecified osteoarthritis, unspecified site: Secondary | ICD-10-CM

## 2023-01-04 DIAGNOSIS — I10 Essential (primary) hypertension: Secondary | ICD-10-CM

## 2023-01-04 DIAGNOSIS — C801 Malignant (primary) neoplasm, unspecified: Secondary | ICD-10-CM

## 2023-01-04 DIAGNOSIS — F419 Anxiety disorder, unspecified: Secondary | ICD-10-CM

## 2023-01-04 DIAGNOSIS — G4730 Sleep apnea, unspecified: Secondary | ICD-10-CM

## 2023-01-04 DIAGNOSIS — R609 Edema, unspecified: Secondary | ICD-10-CM

## 2023-01-04 DIAGNOSIS — J449 Chronic obstructive pulmonary disease, unspecified: Secondary | ICD-10-CM

## 2023-01-04 DIAGNOSIS — I5022 Chronic systolic (congestive) heart failure: Secondary | ICD-10-CM

## 2023-01-04 DIAGNOSIS — M549 Dorsalgia, unspecified: Secondary | ICD-10-CM

## 2023-01-04 DIAGNOSIS — J45909 Unspecified asthma, uncomplicated: Secondary | ICD-10-CM

## 2023-01-04 DIAGNOSIS — C55 Malignant neoplasm of uterus, part unspecified: Secondary | ICD-10-CM

## 2023-01-04 DIAGNOSIS — Z79899 Other long term (current) drug therapy: Secondary | ICD-10-CM

## 2023-01-04 DIAGNOSIS — E785 Hyperlipidemia, unspecified: Secondary | ICD-10-CM

## 2023-01-04 NOTE — Progress Notes
Date of Service: 01/04/2023    Raven [name] is a 77 y.o. female.       HPI  I had the pleasure of seeing Raven [name], who is accompanied by her son in the [facility] today for heart failure follow-up. Patient follows with Dr. Vanetta Shawl     Patient is a 77 y.o. 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       She was recently hospitalized at [facility] from 7/7 through 7/16 with a 4 pound weight gain since last office visit with me.  Her milrinone dose was increased to 0.3 mics per kilogram per minute, device interrogation showed ERI, EP was consulted with recommended upgrade of her ICD to CRT-D.  She had leukocytosis, ID was consulted and had no contraindications for GEN change and device upgrade.  She was transition to torsemide 40 mg daily in addition she also had significant anemia with a hemoglobin of 6.6 requiring blood transfusion.  On 7/15 her CBC for her milrinone became dislodged, she underwent replacement in interventional radiology on 7/16.  She was recommended to have outpatient follow-up with GI for EGD/EUS with polypectomy for gastric and duodenal polyps and needs an incision check .     She was last seen in clinic on 8/5 by Raven Broad APRN, at that office visit she had recommended a 5 mg metolazone with extra potassium and repeat chemistry.     TODAY'S VISIT   Raven [name], a patient with a history of heart failure and myeloma, presents for a routine follow-up. The patient reports no recent issues with breathing, coughing, chest pain, or pressure. She denies any swelling in her ankles. The patient's weight has been stable, with a slight decrease noted.     The patient has been managing her heart failure with metoprolol and torsemide, and her myeloma with Darzalex. She also takes metolazone as needed for fluid retention, but it is not used daily. The patient's hemoglobin levels have improved from 7.4 to 10.4, indicating a positive response to her myeloma treatment.     The patient has been using a motorized scooter for mobility and has a chairlift installed at home to assist with stairs. She reports no issues with mobility around the house. The patient also receives weekly home health visits for dressing changes related to her milrinone infusion.     The patient's overall condition appears to be stable with no new or worsening symptoms reported.          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    Goals of care, counseling/discussion 09/22/2022  Receiving inotropic medication 09/19/2022    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 09/19/2022    Mitral valve insufficiency 09/19/2022    Abnormal finding on imaging 09/18/2022    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    Gastric polyp 09/18/2022    Polyp of duodenum 09/18/2022    Diverticulitis 08/10/2022    History of MI (myocardial infarction) 05/21/2022    Acute cystitis without hematuria 03/18/2022    Colitis 03/18/2022    Compression fracture of L1 lumbar vertebra (HCC) 03/18/2022    Leukocytosis 03/18/2022    Intractable low back pain 03/17/2022    Chronic kidney disease, 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/2022    Hypokalemia 11/25/2021    S/P drug eluting coronary stent placement 08/21/2021    Benign essential hypertension 08/21/2021    Left carotid stenosis 08/21/2021    Malignant neoplasm of uterus (HCC) 08/21/2021    Obstructive sleep apnea syndrome 08/21/2021    Coronary artery disease involving native coronary artery of native heart without angina pectoris 08/12/2021    Cardiomyopathy, ischemic 08/12/2021    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    Multiple myeloma (HCC) 01/05/2020    Type 2 diabetes mellitus without complications (HCC) 12/06/2018    Chronic obstructive pulmonary disease, unspecified (HCC) 12/06/2018    Dyslipidemia 12/06/2018         ROS    Physical Exam  General Appearance: In NAD, pale  Neck Veins: normal JVP, neck veins are not distended; no HJR   Chest Inspection: right SCL dressing CDI, slight redness noted at insertion site, no drainage or palpable cord noted  Respiratory Effort: breathing comfortably, no respiratory distress   Auscultation/Percussion: lungs clear to auscultation, no rales, rhonchi or wheezing   Cardiac Rhythm: regular rhythm and tachycardic rate   Cardiac Auscultation: S1, S2 normal, no rub, no definite S3  or S4   Murmurs: no murmur   Peripheral Circulation: normal peripheral circulation   Pedal Pulses: normal symmetric pedal pulses   Lower Extremity Edema: 1+ right lower extremity edema   Abdominal Exam: soft, non-tender, no obvious masses, bowel sounds normal   Gait & Station: In a wheelchair today  Orientation: oriented to person, place and time   Affect & Mood: appropriate and sustained affect   Language and Memory: patient responsive and seems to comprehend information   Neurologic Exam: neurological assessment grossly intact   Vital signs were reviewed        Cardiovascular Studies:Echocardiographic Findings 09/18/2022   Left Ventricle The left ventricular wall thickness is normal. Normal geometry. The left ventricular systolic function is moderately reduced. The visually estimated ejection fraction is 30%. Unable to assess left ventricular diastolic function. Unable to assess left atrial pressure.   Right Ventricle The right ventricle is mildly dilated. The right ventricular systolic function is mildly reduced. Pacemaker lead present in the ventricle.   Left Atrium Mildly dilated.   Right Atrium Not well seen. Normal size. Pacemaker lead present in the right atrium.   IVC/SVC Inferior vena cava was not well seen; right atrial pressure could not be estimated.   Mitral Valve No stenosis. Moderate to severe regurgitation. There is mild mitral annular calcification without stenosis.   Tricuspid Valve Valve has a dilated annulus. No stenosis. Severe regurgitation.   Aortic Valve The aortic valve was not well seen. Trace regurgitation.   Pulmonary The pulmonic valve was not seen well but no Doppler evidence of stenosis.   Aorta The ascending aorta is mildly dilated.   Pericardium No pericardial effusion.  5/24 RHC RA 13, RV 70/12, PA 72/27/47, PCWP 17, CO 1.9, CI 1.1 via FICK, CO 2.5, CI 1.4 via thermo with PA sat 41%.       Cardiovascular Health Factors  Vitals BP Readings from Last 3 Encounters:   01/04/23 118/68   12/14/22 114/67   11/30/22 117/57     Wt Readings from Last 3 Encounters:   01/04/23 75.2 kg (165 lb 12.8 oz)   12/14/22 74.6 kg (164 lb 6.4 oz)   11/30/22 79.2 kg (174 lb 9.6 oz)     BMI Readings from Last 3 Encounters:   01/04/23 30.33 kg/m?   12/14/22 30.07 kg/m?   11/30/22 31.93 kg/m?      Smoking Social History     Tobacco Use   Smoking Status Former    Current packs/day: 0.00    Average packs/day: 1 pack/day for 20.0 years (20.0 ttl pk-yrs)    Types: Cigarettes    Start date: 77    Quit date: 2010    Years since quitting: 14.6    Passive exposure: Never   Smokeless Tobacco Never      Lipid Profile Cholesterol   Date Value Ref Range Status   09/16/2022 107 <200 MG/DL Final     HDL   Date Value Ref Range Status   09/16/2022 50 >40 MG/DL Final     LDL   Date Value Ref Range Status   09/16/2022 45 <100 mg/dL Final Triglycerides   Date Value Ref Range Status   09/16/2022 72 <150 MG/DL Final      Blood Sugar Hemoglobin A1C   Date Value Ref Range Status   09/16/2022 8.0 (H) 4.0 - 5.7 % Final     Comment:     The ADA recommends that most patients with type 1 and type 2 diabetes maintain   an A1c level <7%.       Glucose   Date Value Ref Range Status   12/22/2022 109 (H) 70 - 100 Final   12/07/2022 128 (H) 70 - 100 Final   11/23/2022 117 (H)  Final     Glucose, POC   Date Value Ref Range Status   11/10/2022 106 (H) 70 - 100 MG/DL Final   16/01/9603 68 (L) 70 - 100 MG/DL Final   54/12/8117 147 (H) 70 - 100 MG/DL Final          Problems Addressed Today  No diagnosis found.    Assessment and Plan     HFrEF due to ICM  The most recent echo as noted above.    The patient today describes NYHA Functional Class III, stage C symptoms.    >Diuretic Therapy   Current Dose:  torsemide 40mg  twice daily with as needed metolazone 5 mg Changes:          She appears euvolemic today, clinic weight is 164 pounds, dry weight has been 165 pounds..  I reviewed most recent labs and most recent cardiac testing with patient and family.  Shared medical decision making involves eliciting patient and/or family preferences, education and explaining risks and benefits of management options.  HF Therapy:   GDMT Current Dose Changes made at visit   BB Toprol-XL 37.5 mg daily     ACEI/ARB/ARNI None, CKD     Aldosterone antagonist None, CKD     SGLT2i None PTA, at next office visit can consider addition of this     Hydralazine/Nitrate Not applicable, on inotropes     Ivabradine Not applicable  Cardiac rehab Not applicable     Advanced Therapies Candidacy/Contraindications   N/A                 Ambulatory cardiogenic shock: She underwent right heart cath on 09/18/2022 that showed cardiac index of 1.1, she was started on milrinone, currently on 0.30 mcg/kg/min     CRT-D in situ: She underwent GEN change with Dr. Venita Lick, she did not meet criteria for upgrade to CRT-D since the QRS duration was less than 0.12.     Multiple myeloma: Follows at [facility], was seen during this admission by Dr. Mitchell Heir.    She has chronic anemia, her hemoglobin on 7/13 was 6.6, she did have a unit of blood, repeat CBC on 7/22 showed hemoglobin 7.4  -She is scheduled to see GI in November.  She reports that she will be seeing oncology and she usually gets a medication that stimulates RBC production     -She follows with  [facility] oncology she was given Darzalex subcutaneous as well as Retacrit.       Atrial fibrillation; last EKG on 6/5 showed sinus tachycardia with PVCs.  - Rates 90's -100's  > Continue Eliquis 5mg  BID   > Toprol as above   > If rate uncontrolled could consider AVN ablation in future          CAD s/p CABG x3 '10, PCI in '10  HLD  Carotid artery disease with prior endarterectomy '16  - PTA Plavix s/p CEA, this was stopped 09/21/22 and started on bASA so intervention by GI could be performed in outpt setting      Will refer her for CardioMEMS.  She is planning to go to [facility] in October.  After that we will plan to admit her for taking her off milrinone  40 minutes of time spent with patient and family.  Greater than 30 minutes of this time spent counseling regarding heart failure with reduced ejection fraction, medications and volume control.  Vanetta Shawl M.D  Advance Heart Failure and Transplant Cardiologist    Current Medications (including today's revisions)   acetaminophen (TYLENOL) 325 mg tablet Take three tablets by mouth every 8 hours as needed for Pain. Indications: pain    acyclovir (ZOVIRAX) 400 mg tablet Take one tablet by mouth twice daily.    albuterol sulfate (PROAIR HFA) 90 mcg/actuation HFA aerosol inhaler Inhale two puffs by mouth into the lungs every 6 hours as needed for Wheezing or Shortness of Breath.    amiodarone (CORDARONE) 200 mg tablet Take one tablet by mouth daily. Indications: prevention of recurrent atrial fibrillation    apixaban (ELIQUIS) 5 mg tablet Take one tablet by mouth twice daily. Indications: treatment to prevent blood clots in chronic atrial fibrillation    aspirin 81 mg chewable tablet Chew one tablet by mouth daily. Indications: stroke prevention    budesonide-formoterol HFA (SYMBICORT) 160-4.5 mcg/actuation aerosol inhaler Inhale two puffs by mouth into the lungs twice daily. Indications: bronchospasm prevention with COPD    cholecalciferol (VITAMIN D) 1,000 units tablet Take one tablet by mouth daily.    cyanocobalamin (VITAMIN B-12) 1,000 mcg tablet Take 5,000 Units by mouth daily.    dexAMETHasone (DECADRON) 4 mg tablet Patient is taking 2 tablets    dicyclomine (BENTYL) 10 mg capsule Take one capsule by mouth daily.    EMU OIL (BULK) MISC Use  as directed. Apply topically as needed    febuxostat (ULORIC) 40 mg tablet Take one-half tablet by mouth daily.  Indications: taken on chemo days    ferrous sulfate (FEOSOL) 325 mg (65 mg iron) tablet Take one tablet by mouth daily. Take on an empty stomach at least 1 hour before or 2 hours after food.    insulin aspart (U-100) (NOVOLOG FLEXPEN U-100 INSULIN) 100 unit/mL (3 mL) PEN Inject  under the skin. Per sliding scale ( she doesn't have sheet with her)    insulin detemir U-100 (LEVEMIR FLEXTOUCH) 100 unit/mL (3 mL) injection pen Inject twenty five Units under the skin at bedtime daily.    metOLazone (ZAROXOLYN) 5 mg tablet Take one tablet by mouth daily as needed.    metoprolol succinate XL (TOPROL XL) 25 mg extended release tablet Take 1.5 tablets by mouth daily. Indications: chronic heart failure, ventricular rate control in atrial fibrillation    milrinone (PRIMACOR) 200 mcg/mL infusion Administer 22.68 mcg/min through vein Continuous. Indications: sudden and serious symptoms of heart failure called acute decompensated heart failure    nitroglycerin (NITROSTAT) 0.4 mg tablet Place one tablet under tongue every 5 minutes as needed for Chest Pain.    ONETOUCH VERIO TEST STRIPS test strip USE STRIP TO CHECK GLUCOSE TWICE DAILY FOR DIABETES MELLITUS    pantoprazole DR (PROTONIX) 40 mg tablet Take one tablet by mouth daily. Indications: gastritis    potassium chloride SR (K-DUR) 20 mEq tablet Take one tablet by mouth twice daily. Indications: prevention of low potassium in the blood    prochlorperazine maleate (COMPAZINE) 10 mg tablet Take one tablet by mouth every 6 hours as needed.    rosuvastatin (CRESTOR) 10 mg tablet Take one tablet by mouth daily. Indications: hardening of the arteries due to plaque buildup    sertraline (ZOLOFT) 50 mg tablet Take one tablet by mouth daily.    torsemide (DEMADEX) 20 mg tablet Take two tablets by mouth twice daily. Indications: accumulation of fluid resulting from chronic heart failure

## 2023-01-04 NOTE — Progress Notes
Patient qualifies for Cardiomems.  Will need PA.     Patient sent to [place] via Staff Message by [name], RN per Dr. Dickie La.    CardioMEMS Primary Screening                                  Date: 01/04/2023  Patient Referred by: Dr. Herma Carson. [name]   Insurance: [facility] medicare   Heart failure admission within last 12 months OR Elevated BNP (Elevated BNP is defined by the provider for commercial implants and does not have to be drawn within a certain timeframe)   NT-PRO-BNP: 14,358.0 (11/01/2022)                                      BMI (at time of BNP): 30.33    Hospitalization Dates: 11/01/2022  Hospital Name: [facility]  IV Diuretics During Admission?: Yes Yes        NYHA HF Class II or Class III  Yes  NYHA CLASS: III        Able to take dual antiplatelet or anticoagulants for 1 month post implant Yes   Additional Screening if all above are yes        Patient with active infection No        History of recurrent PE or DVT  (If yes, does patient have IVC filter?) No        Unable to tolerate RHC No        GFR < 25 mL/min and non-responsive to diuretics or on chronic renal dialysis Yes        Congenital heart disease or mechanical right heart valve No        Unrepaired severe valvular disease? No        CRT device implanted within last 3 months?          Date of Implant: ** No        BMI > 35, axillary chest circumference > 165 cm                        ** cm  No        Patient unwilling to transmit device date or concerns with patient compliance No          Please ask patient the following:        Most recent Echo date: 09/17/2022     EF:30%       Patient manages own medications?  (If no, who manages pt's medications?) Yes       Patient/caregiver able to make medication interventions via phone?  Yes     CardioMEMS Pre-Implant Patient Education                                   Education Completed by: **   Video: How the CardioMEMs HF System Works **   [employer]  **

## 2023-01-05 ENCOUNTER — Encounter: Admit: 2023-01-05 | Discharge: 2023-01-05 | Payer: MEDICARE

## 2023-01-05 NOTE — Telephone Encounter
Called patient as a follow up regarding Cardiomems.  Patient had an office visit yesterday (9/9) with Dr. Dickie La.  Patient was referred to [place] for device.  Attempted to contact patient to see if they were interested in [place] submitting PA.  Left voicemail with callback number requesting a return call.

## 2023-01-06 ENCOUNTER — Encounter: Admit: 2023-01-06 | Discharge: 2023-01-06 | Payer: MEDICARE

## 2023-01-06 MED ORDER — POTASSIUM CHLORIDE 20 MEQ PO TBTQ
40 meq | ORAL_TABLET | Freq: Two times a day (BID) | ORAL | 3 refills | 30.00000 days | Status: AC
Start: 2023-01-06 — End: ?

## 2023-01-06 NOTE — Telephone Encounter
I got a call from home infusion that potassium is 3.2. She is on KCl 20 mEQ BID. We will increase to 40 BID. Her BP is around 85. She reports that she is not having LH or dizziness.  We will check BMP in one week

## 2023-01-06 NOTE — Telephone Encounter
Marcelino Duster with [facility] called reporting pts BPs have been low the last 2 days. No symptoms reported. Call back number [phone]    Date Weight B/P Pulse   9/11  65/43  70/45    9/10  82/48  116/63        Current Outpatient Medications   amiodarone (CORDARONE) 200 mg, Oral, DAILY   ELIQUIS 5 mg, Oral, TWICE DAILY   metOLazone (ZAROXOLYN) 5 mg, Oral, DAILY  PRN   metoprolol succinate XL (TOPROL XL) 37.5 mg, Oral, DAILY   milrinone (PRIMACOR) 200 mcg/mL infusion 0.3 mcg/kg/min, Intravenous, CONTINUOUS   nitroglycerin (NITROSTAT) 0.4 mg, Sublingual, EVERY  5 MIN PRN   potassium chloride SR (K-DUR) 20 mEq tablet 20 mEq, Oral, TWICE DAILY   torsemide (DEMADEX) 40 mg, Oral, TWICE DAILY

## 2023-01-06 NOTE — Telephone Encounter
Called and spoke with patient. Confirmed patient's identity with full name and DOB.     Asked patient for update on weights, BP's, and any other symptoms based on call from [facility] nurse with low BP's for the last two days.     BP's: 9/7- 124/61, 9/8- 133/68, 9/9- 99/53, 9/10- 82/84, 9/11- 65/43 (patient stated morning report) while on the phone had her retake her BP and it was 100/51 at 1540    Weights: 9/7- 169.2lbs, 9/8- 165.8lbs, 9/9- 163 lbs, 9/10- 162lbs, 9/11- 159lbs    Patient stated she took her prn metolazone on 9/7 with the 169.2lbs weight. She stated no SOB, increased swelling, dizziness, lightheadedness, or any other symptoms. Stated she was only drinking about 3 cups of water per day and highly encouraged her to increase her water intake to 6-8 glasses of water today. Asked her to continue logging her BP's and weights and that we would call to f/u with her on Friday, 9/13. Patient stated milrinone has been running continuously with no issues/alarms.

## 2023-01-06 NOTE — Telephone Encounter
Called and spoke with patient on authorized line using two patient identifiers. Informed patient that Dr. Bing Matter received a call from home infusion that her potassium was low at 3.2 Instructed patient to increase potassium to 40 mEq twice daily. Informed patient that we would recheck on her weekly labs. Patient verbalized understanding and denies any additional needs at this time.

## 2023-01-07 ENCOUNTER — Encounter: Admit: 2023-01-07 | Discharge: 2023-01-07 | Payer: MEDICARE

## 2023-01-08 ENCOUNTER — Encounter: Admit: 2023-01-08 | Discharge: 2023-01-08 | Payer: MEDICARE

## 2023-01-13 ENCOUNTER — Encounter: Admit: 2023-01-13 | Discharge: 2023-01-13 | Payer: MEDICARE

## 2023-01-14 ENCOUNTER — Encounter: Admit: 2023-01-14 | Discharge: 2023-01-14 | Payer: MEDICARE

## 2023-01-18 ENCOUNTER — Encounter: Admit: 2023-01-18 | Discharge: 2023-01-18 | Payer: MEDICARE

## 2023-01-19 ENCOUNTER — Encounter: Admit: 2023-01-19 | Discharge: 2023-01-19 | Payer: MEDICARE

## 2023-01-19 NOTE — Telephone Encounter
Monica with [employer] called reporting that request for service has been denied due to criteria not being met. Call back number for appeals is [phone], case ID [id number]

## 2023-01-22 ENCOUNTER — Encounter: Admit: 2023-01-22 | Discharge: 2023-01-22 | Payer: MEDICARE

## 2023-01-26 ENCOUNTER — Encounter: Admit: 2023-01-26 | Discharge: 2023-01-26 | Payer: MEDICARE

## 2023-01-26 NOTE — Telephone Encounter
Received appeal denial today. Case was moved to appeals with [employer].

## 2023-01-26 NOTE — Telephone Encounter
Spoke with Raven [name] today to provide updates for her PA and appeal for [employer]. Explained the process and that [employer] is still fighting. If/when we receive any further updates, we would let her know.

## 2023-01-27 ENCOUNTER — Encounter: Admit: 2023-01-27 | Discharge: 2023-01-27 | Payer: MEDICARE

## 2023-01-27 NOTE — Telephone Encounter
[facility] APRN Sue Lush called reporting pts BP has been in the 90s recently, 120/60 today. Sue Lush reports pt had vasovagal episode today and pt gets dizzy when standing. Asks for call back to discuss recs.     Current Outpatient Medications   Medication Instructions    acetaminophen (TYLENOL) 975 mg, Oral, EVERY  8 HOURS PRN    acyclovir (ZOVIRAX) 400 mg, Oral, TWICE DAILY    albuterol sulfate (PROAIR HFA) 90 mcg/actuation HFA aerosol inhaler 2 puffs, Inhalation, EVERY  6 HOURS PRN    amiodarone (CORDARONE) 200 mg, Oral, DAILY    aspirin 81 mg, Oral, DAILY    budesonide-formoterol HFA (SYMBICORT) 160-4.5 mcg/actuation aerosol inhaler 2 puffs, Inhalation, TWICE DAILY    CHOLEcalciferoL (vitamin D3) (VITAMIN D3) 1,000 Units, Oral, DAILY    cyanocobalamin (vitamin B-12) (VITAMIN B-12) 5,000 Units, Oral, DAILY    dexAMETHasone (DECADRON) 4 mg tablet Patient is taking 2 tablets    dicyclomine (BENTYL) 10 mg, Oral, DAILY    ELIQUIS 5 mg, Oral, TWICE DAILY    EMU OIL (BULK) MISC Misc.(Non-Drug; Combo Route), Apply topically as needed    febuxostat (ULORIC) 20 mg, Oral, DAILY    ferrous sulfate (FEOSOL) 325 mg, Oral, DAILY, Take on an empty stomach at least 1 hour before or 2 hours after food.     insulin aspart (U-100) (NOVOLOG FLEXPEN U-100 INSULIN) 100 unit/mL (3 mL) PEN Subcutaneous, Per sliding scale ( she doesn't have sheet with her)    insulin detemir U-100 (LEVEMIR FLEXTOUCH) 25 Units, Subcutaneous, AT BEDTIME DAILY    metOLazone (ZAROXOLYN) 5 mg, Oral, DAILY  PRN    metoprolol succinate XL (TOPROL XL) 37.5 mg, Oral, DAILY    milrinone (PRIMACOR) 200 mcg/mL infusion 0.3 mcg/kg/min, Intravenous, CONTINUOUS    nitroglycerin (NITROSTAT) 0.4 mg, Sublingual, EVERY  5 MIN PRN    ONETOUCH VERIO TEST STRIPS test strip USE STRIP TO CHECK GLUCOSE TWICE DAILY FOR DIABETES MELLITUS    pantoprazole DR (PROTONIX) 40 mg, Oral, DAILY    potassium chloride SR (K-DUR) 20 mEq tablet 40 mEq, Oral, TWICE DAILY    prochlorperazine maleate (COMPAZINE) 10 mg, Oral, EVERY  6 HOURS PRN    rosuvastatin (CRESTOR) 10 mg, Oral, DAILY    sertraline (ZOLOFT) 50 mg tablet 1 tablet, Oral, DAILY    torsemide (DEMADEX) 40 mg, Oral, TWICE DAILY

## 2023-01-28 ENCOUNTER — Encounter: Admit: 2023-01-28 | Discharge: 2023-01-28 | Payer: MEDICARE

## 2023-01-28 DIAGNOSIS — R570 Cardiogenic shock: Secondary | ICD-10-CM

## 2023-01-28 DIAGNOSIS — I2510 Atherosclerotic heart disease of native coronary artery without angina pectoris: Secondary | ICD-10-CM

## 2023-01-28 MED ORDER — POTASSIUM CHLORIDE 20 MEQ PO TBTQ
ORAL_TABLET | ORAL | 1 refills | 30.00000 days | Status: AC
Start: 2023-01-28 — End: ?

## 2023-01-28 MED ORDER — TORSEMIDE 20 MG PO TAB
40 mg | ORAL_TABLET | Freq: Every day | ORAL | 3 refills | 67.50000 days | Status: AC
Start: 2023-01-28 — End: ?

## 2023-01-28 NOTE — Telephone Encounter
Pt notified of results/recommendations. Pt verbalized understanding and reported no questions.     MAR updated, lab ordered.

## 2023-01-28 NOTE — Telephone Encounter
-----   Message from Pelican Marsh M sent at 01/28/2023  7:56 AM CDT -----  Repeat CBC is stable on 9/30, her potassium is critically low at 3.2, it looks like her creatinine is increasing as well.  She is taking 40 of torsemide twice daily, please reduce that to once a day, please increase the potassium from 2 pills twice a day to take an additional 20 mEq daily and get another chemistry in 1 week.  Thanks, DM

## 2023-01-30 ENCOUNTER — Encounter: Admit: 2023-01-30 | Discharge: 2023-01-30 | Payer: MEDICARE

## 2023-02-01 ENCOUNTER — Encounter: Admit: 2023-02-01 | Discharge: 2023-02-01 | Payer: MEDICARE

## 2023-02-02 ENCOUNTER — Encounter: Admit: 2023-02-02 | Discharge: 2023-02-02 | Payer: MEDICARE

## 2023-02-02 NOTE — Telephone Encounter
Fredricka Bonine, APRN-NP  P [facility] Nurse [facility] Team [facility]  Her inotrope monitoring labs show stable hemoglobin and hematocrit, no leukocytosis.  The chemistry shows increase in BUN, creatinine is stable.  How often is she taking the metolazone?  The prescription on 8/5 says 1 tablet as needed.  This should only be as instructed by the heart failure team.  If she is actually taking it I think she needs to hold that and get another chemistry in a week.  DM

## 2023-02-03 ENCOUNTER — Encounter: Admit: 2023-02-03 | Discharge: 2023-02-03 | Payer: MEDICARE

## 2023-02-03 NOTE — Telephone Encounter
Returned call to patient, states she does have amio but needs a refill at the pharmcy. Informed her the pharmacy should have one more refill available for her. She also states she took nitro once although it did not help the pain. Her son then gave her antiacid pill which did help. She states she also saw her GP today and is feeling good. Denies any further needs at thsi time.

## 2023-02-05 ENCOUNTER — Encounter: Admit: 2023-02-05 | Discharge: 2023-02-05 | Payer: MEDICARE

## 2023-02-05 NOTE — Telephone Encounter
HHRN called from [phone] repotting weight gain from pt the past few days. Pts torsemide was reduced on 10/3.     Patient Status  patient is an established patient with [facility].        Signs and Symptoms  No symptoms/      Medication Review     amiodarone (CORDARONE) 200 mg, Oral, DAILY   metOLazone (ZAROXOLYN) 5 mg, Oral, DAILY  PRN   metoprolol succinate XL (TOPROL XL) 37.5 mg, Oral, DAILY   milrinone (PRIMACOR) 200 mcg/mL infusion 0.3 mcg/kg/min, Intravenous, CONTINUOUS   nitroglycerin (NITROSTAT) 0.4 mg, Sublingual, EVERY  5 MIN PRN   potassium chloride SR (K-DUR) 20 mEq tablet Take three tablets by mouth daily AND two tablets every evening. Indications: prevention of low potassium in the blood   rosuvastatin (CRESTOR) 10 mg, Oral, DAILY   torsemide (DEMADEX) 40 mg, Oral, DAILY            Fluid/sodium Intake  Patient is drinking under  64 oz of fluid daily       Date Weight B/P Pulse   10/11 168     10/10 164     10/9 158.8

## 2023-02-05 NOTE — Telephone Encounter
Returned call to [facility], Marcelino Duster who states patient denies swelling or SOA. Relayed order from [employer] (verbal order) to increase torsemide to 40 mg BID today and tomorrow for weight gain and return to 40 mg Sunday. Marcelino Duster will call to update patient and will inform her of follow up appointment made for 10/22.

## 2023-02-08 ENCOUNTER — Encounter: Admit: 2023-02-08 | Discharge: 2023-02-08 | Payer: MEDICARE

## 2023-02-09 ENCOUNTER — Encounter: Admit: 2023-02-09 | Discharge: 2023-02-09 | Payer: MEDICARE

## 2023-02-10 ENCOUNTER — Encounter: Admit: 2023-02-10 | Discharge: 2023-02-10 | Payer: MEDICARE

## 2023-02-11 ENCOUNTER — Encounter: Admit: 2023-02-11 | Discharge: 2023-02-11 | Payer: MEDICARE

## 2023-02-11 DIAGNOSIS — Z79899 Other long term (current) drug therapy: Secondary | ICD-10-CM

## 2023-02-11 NOTE — Progress Notes
Cardiovascular Medicine-Electrophysiology   Office Visit    Appointment Date: 02/15/2023    Raven [name]   [address]  [place] [address]     RE: Raven [name]  DOB: Feb 07, 1946  Visit provider: Haynes Bast, APRN-NP    I had the pleasure of seeing Raven [name] in person today. She is a(n) 77 y.o. 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  She is followed by Dr. Vanetta Shawl and was last seen on 01/04/2023.    During a hospitalization in July where she presented with weight gain, her single-chamber Abbott ICD generator implanted originally in 2013 was noted to have reached elective replacement.  On 11/04/2022 she underwent pulse generator change and addition of an atrial lead for management of paroxysmal atrial fibrillation/tachycardia (Dr. Bradly Bienenstock).At the time, she did not meet criteria for a CRT due to IVCD with QRSd ~131msec. She was loaded with oral amiodarone.      She was a no show to follow up on 02/08/2023.      Raven [name] denies any clinically significant event since implant. The patient denies pain or concerns at the Infraclavicular incision . The patient denies fever, chills or night sweats. The patient's blood pressure was recorded as 110/60 during the visit, but she reports episodes of low blood pressure at home, sometimes as low as 80/40-50. During these episodes, the patient does not feel unwell but tends to stay in bed due to low energy.     She has successfully sent a remote transmission.  The device interrogation today shows and underlying rhythm of sinus 90-93 bpm. The  battery longevity is estimated at ~ 8 years. The patient atrial paces 1% and ventricular paces 1% of the time.  There have been fluctuations in RA threshold since implant.  Her right atrial threshold was measured in office at 0.5 V at 0.5 ms and the impedance has been stable.  She has had 25 AMS episodes representing less than 1% burden.  Available EGM suggest far field R wave oversensing.  Device was programmed to minimize FFRWO.  There were 10 episodes of SVT, EGM suggest sinus tachycardia.    Raven [name] is tolerating medications without ill side effects and denies bleeding issues.       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                               Past Medical History:   Diagnosis Date    Anxiety     Arthritis     Asthma     Asymptomatic stenosis of right carotid artery     Back pain     Cancer of uterus (HCC)     COPD (chronic obstructive pulmonary disease) (HCC)     Coronary artery disease     Diabetes mellitus (HCC)     Dyslipidemia     Flank mass     Fluid retention     Heart disease     Hypertension     Other malignant neoplasm without specification of site  Sleep apnea     Compliant w CPAP    Vision decreased      Surgical History:   Procedure Laterality Date    ROBOT ASSISTED LOW ANTERIOR RESECTION N/A 08/10/2022    Performed by Benetta Spar, MD at [facility] OR    ESOPHAGOGASTRODUODENOSCOPY WITH BIOPSY - FLEXIBLE N/A 09/17/2022    Performed by Tempie Hoist, DO at [facility] ENDO    CATHETERIZATION RIGHT HEART N/A 09/18/2022    Performed by Cath, Physician at [facility] CATH LAB    REMOVAL AND REPLACEMENT IMPLANTABLE DEFIBRILLATOR GENERATOR - SINGLE LEAD SYSTEM Left 11/04/2022    Performed by Kathreen Cornfield, MD at [facility] EP LAB    INSERTION/ REPLACEMENT PERMANENT PACEMAKER WITH ATRIAL LEAD Left 11/04/2022    Performed by Kathreen Cornfield, MD at [facility] EP LAB    Injection Venography Extremity Left 11/04/2022    Performed by Kathreen Cornfield, MD at [facility] EP LAB    BONE MARROW BIOPSY      CARDIAC DEFIBRILLATOR PLACEMENT      [employer]    CARDIAC SURGERY      CABG 3 V CAROTID ENDARDECTOMY Right     COLONOSCOPY      HX CORONARY STENT PLACEMENT      HX HEART CATHETERIZATION      HX HYSTERECTOMY      TUNNELED VENOUS PORT PLACEMENT Right     Chest     Allergies   Allergen Reactions    Bortezomib RASH     Noted in Provider note 8/22 cancer treatment drug    Allopurinol HIVES    Sulfa (Sulfonamide Antibiotics) NAUSEA ONLY     Family History   Problem Relation Name Age of Onset    Cancer Mother      Diabetes Mother      Hypertension Mother      Cancer-Breast Sister      Cancer-Ovarian Sister      Cancer Sister      Diabetes Sister      Heart Disease Sister      Hypertension Sister      Heart Disease Brother      Hypertension Brother      Stroke Brother       Social History     Tobacco Use    Smoking status: Former     Current packs/day: 0.00     Average packs/day: 1 pack/day for 20.0 years (20.0 ttl pk-yrs)     Types: Cigarettes     Start date: 83     Quit date: 2010     Years since quitting: 14.8     Passive exposure: Never    Smokeless tobacco: Never   Vaping Use    Vaping status: Never Used   Substance Use Topics    Alcohol use: Not Currently    Drug use: Not Currently            Review of Systems   All other systems reviewed and are negative.      Vitals:    02/15/23 1004   BP: 110/60   BP Source: Arm, Right Upper   Pulse: 90   O2 Device: None (Room air)   PainSc: Zero   Weight: 75.3 kg (166 lb)   Height: 157.5 cm (5' 2)     Body mass index is 30.36 kg/m?Marland Kitchen     Physical Exam   HENT:   Right Ear: External ear normal.   Left Ear: External ear normal.  Nose: Nose normal.   Cardiovascular: Normal rate and regular rhythm.   Pulmonary/Chest: Effort normal and breath sounds normal.   Abdominal: Soft. Normal appearance.   Musculoskeletal:         General: No swelling. Normal range of motion.      Cervical back: Normal range of motion.   Neurological: She is alert.   Skin: Skin is warm.   Psychiatric: Her behavior is normal. Mood normal.     Left-sided device, normal appearance    Lab Results   Component Value Date/Time    WBC 6.65 02/10/2023 12:00 AM    RBC 3.41 (L) 02/10/2023 12:00 AM    HGB 10.2 (L) 02/10/2023 12:00 AM    HCT 32 (L) 02/10/2023 12:00 AM    PLTCT 260 02/10/2023 12:00 AM     Lab Results   Component Value Date/Time    K 4.7 02/10/2023 12:00 AM    CR 2.86 (H) 02/10/2023 12:00 AM    AST 25 02/10/2023 12:00 AM    ALT 24 02/10/2023 12:00 AM     Lab Results   Component Value Date/Time    CHOL 107 09/16/2022 07:02 PM    TRIG 72 09/16/2022 07:02 PM    HDL 50 09/16/2022 07:02 PM    LDL 45 09/16/2022 07:02 PM     EKG: Sinus rhythm with 90 bpm.     Final medications:   acetaminophen (TYLENOL) 325 mg tablet Take three tablets by mouth every 8 hours as needed for Pain. Indications: pain    acyclovir (ZOVIRAX) 400 mg tablet Take one tablet by mouth twice daily.    albuterol sulfate (PROAIR HFA) 90 mcg/actuation HFA aerosol inhaler Inhale two puffs by mouth into the lungs every 6 hours as needed for Wheezing or Shortness of Breath.    amiodarone (CORDARONE) 200 mg tablet Take one tablet by mouth daily. Indications: prevention of recurrent atrial fibrillation    apixaban (ELIQUIS) 5 mg tablet Take one tablet by mouth twice daily. Indications: treatment to prevent blood clots in chronic atrial fibrillation    aspirin 81 mg chewable tablet Chew one tablet by mouth daily. Indications: stroke prevention    budesonide-formoterol HFA (SYMBICORT) 160-4.5 mcg/actuation aerosol inhaler Inhale two puffs by mouth into the lungs twice daily. Indications: bronchospasm prevention with COPD    carvediloL (COREG) 6.25 mg tablet Take one tablet by mouth twice daily with meals.    cholecalciferol (VITAMIN D) 1,000 units tablet Take one tablet by mouth daily.    clopiDOGreL (PLAVIX) 75 mg tablet Take one tablet by mouth daily.    cyanocobalamin (VITAMIN B-12) 1,000 mcg tablet Take 5,000 Units by mouth daily.    dexAMETHasone (DECADRON) 4 mg tablet Patient is taking 2 tablets    dicyclomine (BENTYL) 10 mg capsule Take one capsule by mouth daily.    doxycycline hyclate (VIBRAMYCIN) 100 mg capsule Take one capsule by mouth daily.    EMU OIL (BULK) MISC Use  as directed. Apply topically as needed    esomeprazole DR (NEXIUM) 20 mg capsule Take one capsule by mouth daily.    febuxostat (ULORIC) 40 mg tablet Take one-half tablet by mouth daily. Indications: taken on chemo days    ferrous sulfate (FEOSOL) 325 mg (65 mg iron) tablet Take one tablet by mouth daily. Take on an empty stomach at least 1 hour before or 2 hours after food.    insulin detemir U-100 (LEVEMIR FLEXTOUCH) 100 unit/mL (3 mL) injection pen Inject fifteen Units under the skin at  bedtime daily.    [name] 10 mg tablet Take one tablet by mouth daily.    metOLazone (ZAROXOLYN) 5 mg tablet Take one tablet by mouth daily as needed.    milrinone (PRIMACOR) 200 mcg/mL infusion Administer 22.68 mcg/min through vein Continuous. Indications: sudden and serious symptoms of heart failure called acute decompensated heart failure    nitroglycerin (NITROSTAT) 0.4 mg tablet Place one tablet under tongue every 5 minutes as needed for Chest Pain.    ONETOUCH VERIO TEST STRIPS test strip USE STRIP TO CHECK GLUCOSE TWICE DAILY FOR DIABETES MELLITUS    pantoprazole DR (PROTONIX) 40 mg tablet Take one tablet by mouth daily. Indications: gastritis    potassium chloride SR (K-DUR) 20 mEq tablet Take three tablets by mouth daily AND two tablets every evening. Indications: prevention of low potassium in the blood    prochlorperazine maleate (COMPAZINE) 10 mg tablet Take one tablet by mouth every 6 hours as needed.    rosuvastatin (CRESTOR) 20 mg tablet Take one tablet by mouth daily.    sertraline (ZOLOFT) 50 mg tablet Take one tablet by mouth daily.    torsemide (DEMADEX) 20 mg tablet Take two tablets by mouth daily. Indications: accumulation of fluid resulting from chronic heart failure     Encounter Diagnoses   Name Primary?    Paroxysmal atrial fibrillation (HCC) Yes Anticoagulated     On amiodarone therapy     Chronic systolic heart failure (HCC)     ICD (implantable cardioverter-defibrillator) in place     Cardiovascular symptoms         Impression and Plan:     Cardiac electronic cardiac implantable device. The patient has a Abbott dual-chamber ICD. The interrogation today shows normal functioning device with stable lead(s). Device output settings are programmed to optimize safety margin and reduce battery usage. The pulse generator site has healed well without evidence of infection or erosion. We reviewed permanent restrictions and follow up. Recommend an in office device check annually with remote monitoring from home every 12 weeks.     Atrial fibrillation, paroxysmal  Encounter for amiodarone monitoring  Long term anticoagulation (eliquis)  Her atrial arrhythmias are symptomatically well suppressed on current medication regimen.  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  She has episodes of hypotension but generally has more good days than bad days.  -Follow-up with heart failure service as scheduled Fara Boros 02/16/2023                    Treatment goals, progress and next steps, as above were discussed and mutually agreed upon with the patient/family. I have asked the patient to follow up with Dr. Naoma Diener in 6 months. I am happy to see Raven [name] sooner should a need arise.        Thank you for allowing me to participate in [facility].  If I can be of any further assistance, please do not hesitate to contact me.    Sincerely,  Haynes Bast, APRN-NP     Total Time Today was 33 minutes in the following activities: Preparing to see the patient, Obtaining and/or reviewing separately obtained history, Counseling and educating the patient/family/caregiver, and Documenting clinical  information in the electronic or other health record

## 2023-02-11 NOTE — Telephone Encounter
-----   Message from Raven Collum, APRN-NP sent at 02/11/2023  1:55 PM CDT -----  Continue current medications. Repeat BMP in 1 months to make sure renal function is stable.  ----- Message -----  From: Marcille Blanco, RN  Sent: 02/11/2023   1:52 PM CDT  To: Raven Collum, APRN-NP    I called her, she did decrease it back to 40 every day  ----- Message -----  From: Raven Collum, APRN-NP  Sent: 02/11/2023   1:41 PM CDT  To: [facility] Nurse [facility] Team [facility]    Make sure patient decreased dose back to Torsemide 40mg  every day.  ----- Message -----  From: Magdalen Spatz, BSN  Sent: 02/11/2023  10:44 AM CDT  To: Raven Collum, APRN-NP    Hi Raven [name],    Labs for your review     Please look at the Chem  On 02/05/23 pt had worsening symptoms you ordered  increase torsemide to 40 mg BID today and tomorrow for weight gain and return to 40 mg Sunday.     BUN elevated improved from previous lab draw  Creat creeping up    The other labs CBC and mag are for her amiodarone monitoring      MEDS  -Amiodarone 200 mg Q day  -Eliquis 5 mg BID  -Metolazone 5 m,g prn  -Metoprolol XL  37.5 mg Q day  -Milrinone gtt  -Nitro stat  -K+ 60 meq q am : 40 meq Q  pm  -Crestor 10 mg Q day  -Torsemide 40 mg daily    Thank you  Raven [name]

## 2023-02-12 ENCOUNTER — Encounter: Admit: 2023-02-12 | Discharge: 2023-02-12 | Payer: MEDICARE

## 2023-02-15 ENCOUNTER — Ambulatory Visit: Admit: 2023-02-15 | Discharge: 2023-02-15 | Payer: MEDICARE

## 2023-02-15 ENCOUNTER — Encounter: Admit: 2023-02-15 | Discharge: 2023-02-15 | Payer: MEDICARE

## 2023-02-15 DIAGNOSIS — C55 Malignant neoplasm of uterus, part unspecified: Secondary | ICD-10-CM

## 2023-02-15 DIAGNOSIS — Z95810 Presence of automatic (implantable) cardiac defibrillator: Secondary | ICD-10-CM

## 2023-02-15 DIAGNOSIS — I5022 Chronic systolic (congestive) heart failure: Secondary | ICD-10-CM

## 2023-02-15 DIAGNOSIS — J449 Chronic obstructive pulmonary disease, unspecified: Secondary | ICD-10-CM

## 2023-02-15 DIAGNOSIS — Z79899 Other long term (current) drug therapy: Secondary | ICD-10-CM

## 2023-02-15 DIAGNOSIS — E785 Hyperlipidemia, unspecified: Secondary | ICD-10-CM

## 2023-02-15 DIAGNOSIS — M549 Dorsalgia, unspecified: Secondary | ICD-10-CM

## 2023-02-15 DIAGNOSIS — Z7901 Long term (current) use of anticoagulants: Secondary | ICD-10-CM

## 2023-02-15 DIAGNOSIS — R0989 Other specified symptoms and signs involving the circulatory and respiratory systems: Secondary | ICD-10-CM

## 2023-02-15 DIAGNOSIS — J45909 Unspecified asthma, uncomplicated: Secondary | ICD-10-CM

## 2023-02-15 DIAGNOSIS — E119 Type 2 diabetes mellitus without complications: Secondary | ICD-10-CM

## 2023-02-15 DIAGNOSIS — I2510 Atherosclerotic heart disease of native coronary artery without angina pectoris: Secondary | ICD-10-CM

## 2023-02-15 DIAGNOSIS — M1990 Unspecified osteoarthritis, unspecified site: Secondary | ICD-10-CM

## 2023-02-15 DIAGNOSIS — I5023 Acute on chronic systolic (congestive) heart failure: Secondary | ICD-10-CM

## 2023-02-15 DIAGNOSIS — R1900 Intra-abdominal and pelvic swelling, mass and lump, unspecified site: Secondary | ICD-10-CM

## 2023-02-15 DIAGNOSIS — I519 Heart disease, unspecified: Secondary | ICD-10-CM

## 2023-02-15 DIAGNOSIS — I480 Paroxysmal atrial fibrillation: Secondary | ICD-10-CM

## 2023-02-15 DIAGNOSIS — I10 Essential (primary) hypertension: Secondary | ICD-10-CM

## 2023-02-15 DIAGNOSIS — C801 Malignant (primary) neoplasm, unspecified: Secondary | ICD-10-CM

## 2023-02-15 DIAGNOSIS — Z4502 Encounter for adjustment and management of automatic implantable cardiac defibrillator: Secondary | ICD-10-CM

## 2023-02-15 DIAGNOSIS — I6521 Occlusion and stenosis of right carotid artery: Secondary | ICD-10-CM

## 2023-02-15 DIAGNOSIS — H547 Unspecified visual loss: Secondary | ICD-10-CM

## 2023-02-15 DIAGNOSIS — F419 Anxiety disorder, unspecified: Secondary | ICD-10-CM

## 2023-02-15 DIAGNOSIS — G4730 Sleep apnea, unspecified: Secondary | ICD-10-CM

## 2023-02-15 DIAGNOSIS — R609 Edema, unspecified: Secondary | ICD-10-CM

## 2023-02-15 NOTE — Patient Instructions
Follow-Up:    -Thank you for allowing me to take care of you today. My name is Carleene Overlie, Charity fundraiser.    -We would like you to follow up in   6 months with Dr. Smith Robert   The schedule is released approximately 6 months in advance. You should be called or mailed to make an appointment, however if you would like to call us to make this appt, please call [phone].    -You will receive a survey in the upcoming week from [facility] [place] [facility]. Your feedback is important to [place], and helps [place] continue to improve patient care and patient satisfaction.     Changes From Today's Office Visit     No medication changes.    Contacting our office:    -Business Hours: Monday-Friday, 8:00 am-4:30 pm (excluding Holidays).     -For medical questions or concerns, please send [place] a message through your MyChart account or call the [facility] nursing triage line at [phone]. Please leave a detailed message with your name, date of birth, and reason for your call.  If your message is received before 3:30pm, every effort will be made to call you back the same day.  Please allow time for [place] to review your chart prior to call back.     -For medication refills please start by contacting your pharmacy. You can also send [place] a prescription question through your MyChart or call the nurse triage line above.     -Should you have an immediate need of the weekend/nights and holidays, please call our on-call triage line at [phone].    -Our fax number is [phone].    Results & Testing Follow Up:    -Please allow 10-15 business days for the results of any testing to be reviewed. Please call our office if you have not heard from a nurse within this time frame.    -Should you choose to complete testing at an outside facility, please contact our office after completion of testing so that we can ensure that we have received results.    Lab and test results:  As a part of the CARES act, starting 07/27/2019, some results will be released to you via mychart immediately and automatically.  You may see results before your provider sees them; however, your provider will review all these results and then they, or one of their team, will notify you of result information and recommendations.   Critical results will be addressed immediately, but otherwise, please allow [place] time to get back with you prior to you reaching out to [place] for questions.  This will usually take about 72 hours for labs and 5-7 days for procedure test results.      We know you have a choice and want to thank you for choosing [facility].

## 2023-02-16 ENCOUNTER — Encounter: Admit: 2023-02-16 | Discharge: 2023-02-16 | Payer: MEDICARE

## 2023-02-16 ENCOUNTER — Ambulatory Visit: Admit: 2023-02-16 | Discharge: 2023-02-16 | Payer: MEDICARE

## 2023-02-16 DIAGNOSIS — J45909 Unspecified asthma, uncomplicated: Secondary | ICD-10-CM

## 2023-02-16 DIAGNOSIS — E119 Type 2 diabetes mellitus without complications: Secondary | ICD-10-CM

## 2023-02-16 DIAGNOSIS — I10 Essential (primary) hypertension: Secondary | ICD-10-CM

## 2023-02-16 DIAGNOSIS — G4730 Sleep apnea, unspecified: Secondary | ICD-10-CM

## 2023-02-16 DIAGNOSIS — M1990 Unspecified osteoarthritis, unspecified site: Secondary | ICD-10-CM

## 2023-02-16 DIAGNOSIS — I255 Ischemic cardiomyopathy: Secondary | ICD-10-CM

## 2023-02-16 DIAGNOSIS — I519 Heart disease, unspecified: Secondary | ICD-10-CM

## 2023-02-16 DIAGNOSIS — C55 Malignant neoplasm of uterus, part unspecified: Secondary | ICD-10-CM

## 2023-02-16 DIAGNOSIS — C801 Malignant (primary) neoplasm, unspecified: Secondary | ICD-10-CM

## 2023-02-16 DIAGNOSIS — Z79899 Other long term (current) drug therapy: Secondary | ICD-10-CM

## 2023-02-16 DIAGNOSIS — I2510 Atherosclerotic heart disease of native coronary artery without angina pectoris: Secondary | ICD-10-CM

## 2023-02-16 DIAGNOSIS — J449 Chronic obstructive pulmonary disease, unspecified: Secondary | ICD-10-CM

## 2023-02-16 DIAGNOSIS — R609 Edema, unspecified: Secondary | ICD-10-CM

## 2023-02-16 DIAGNOSIS — R570 Cardiogenic shock: Secondary | ICD-10-CM

## 2023-02-16 DIAGNOSIS — R1900 Intra-abdominal and pelvic swelling, mass and lump, unspecified site: Secondary | ICD-10-CM

## 2023-02-16 DIAGNOSIS — E785 Hyperlipidemia, unspecified: Secondary | ICD-10-CM

## 2023-02-16 DIAGNOSIS — H547 Unspecified visual loss: Secondary | ICD-10-CM

## 2023-02-16 DIAGNOSIS — M549 Dorsalgia, unspecified: Secondary | ICD-10-CM

## 2023-02-16 DIAGNOSIS — I6521 Occlusion and stenosis of right carotid artery: Secondary | ICD-10-CM

## 2023-02-16 DIAGNOSIS — F419 Anxiety disorder, unspecified: Secondary | ICD-10-CM

## 2023-02-16 NOTE — Progress Notes
Date of Service: 02/16/2023    Raven [name] is a 77 y.o. female.   Patient was identified using dual identification of name and date of birth.  She was a new patient for me today    HPI       Patient is a 77 y.o. 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     She was last seen by Dr. Dickie [name] on 01/04/23 at which time he was planning to admit her to wean off milrinone in October after she travels to [facility].  Phone call received on 02/05/2023 reporting weight gain and Raven Good, NP recommended that she increase her torsemide to 40 mg twice daily x 3 days and then return to 40 mg once daily.    She was seen in the ED on 02/11/2023 for apparent bleeding around her Gro[place]ong catheter, however no bleeding was found other than a small cut on her skin which was redressed and no further bleeding has occurred.  She traveled to [place] over the weekend and stated other than it being a long drive she got along well.  She states that she rides on a scooter if she has to do much outside the house.  She is not short of breath when walking around in her home.  She denies any lower extremity swelling she states her blood pressures have been normal as monitored by Raven [name]. Raven [name]'s home health nurses.  She does not have a list of her blood pressures but states that they have all been normal.  She denies any lightheadedness, palpitations, orthopnea or PND.    She asked about when she can get off her milrinone infusion and I did not have that plan readily available for me when I saw her today but later found in my notes that Dr. Sherryll [name] plan to admit her after her trip to [facility] in October.  That trip has not happened yet.  Patient and family also asked what the follow-up plan was for her valve, recalling that they were talking to her about some kind of a flap and they have not heard any more about that since that time.  She thinks Dr. Sherryll [name] was trying to treat that in order to get her off the milrinone.            Vitals:    02/16/23 1548   BP: 118/60   BP Source: Arm, Right Upper   Pulse: 106   SpO2: 98%   O2 Device: CPAP/BiPAP   PainSc: Zero   Weight: 75.3 kg (166 lb)   Height: 157.5 cm (5' 2)     Body mass index is 30.36 kg/m?Marland Kitchen   Wt Readings from Last 3 Encounters:   02/16/23 75.3 kg (166 lb)   02/15/23 75.3 kg (166 lb)   01/04/23 75.2 kg (165 lb 12.8 oz)        Past Medical History  Patient Active Problem List    Diagnosis Date Noted    Paroxysmal atrial fibrillation (HCC) 02/11/2023    Anticoagulated 02/11/2023    On amiodarone therapy 02/11/2023    ICD (implantable cardioverter-defibrillator) in place 02/11/2023    Chronic systolic heart failure (HCC)  11/09/2022    Acute gastritis without hemorrhage 09/23/2022    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    Goals of care, counseling/discussion 09/22/2022    Receiving inotropic medication 09/19/2022    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 09/19/2022    Mitral valve insufficiency 09/19/2022    Abnormal finding on imaging 09/18/2022    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    Gastric polyp 09/18/2022    Polyp of duodenum 09/18/2022    Diverticulitis 08/10/2022    History of MI (myocardial infarction) 05/21/2022    Acute cystitis without hematuria 03/18/2022    Colitis 03/18/2022    Compression fracture of L1 lumbar vertebra (HCC) 03/18/2022    Leukocytosis 03/18/2022    Intractable low back pain 03/17/2022    Chronic kidney disease, 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/2022    Hypokalemia 11/25/2021    S/P drug eluting coronary stent placement 08/21/2021    Benign essential hypertension 08/21/2021    Left carotid stenosis 08/21/2021    Malignant neoplasm of uterus (HCC) 08/21/2021    Obstructive sleep apnea syndrome 08/21/2021    Coronary artery disease involving native coronary artery of native heart without angina pectoris 08/12/2021    Cardiomyopathy, ischemic 08/12/2021    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    Multiple myeloma (HCC) 01/05/2020    Type 2 diabetes mellitus without complications (HCC) 12/06/2018    Chronic obstructive pulmonary disease, unspecified (HCC) 12/06/2018    Dyslipidemia 12/06/2018         ROS   see HPI      Physical Exam  Alert and oriented with appropriate communication skills  JVD 6 cm at 90 degrees with (-) HJR  No abdominal distention  No LE edema  Breathing comfortably without wheezing, rales or rhonchi  Auscultated heart with normal S1S2, regular rhythm, no obvious murmur   Able to walk into clinic unassisted       Cardiovascular Studies  Echo 09/17/2022  Severely depressed left ventricular systolic function. LVEF 30%  Mild right ventricular enlargement.   Moderate to severe mitral regurgitation. Mitral annular calcification is present.   The aortic valve is not well seen.  Trace regurgitation. Calcified annulus with acoustic shadowing. Mean gradient is 4-5 mmHg.  Dilated annulus with severe tricuspid valve regurgitation   No pericardial effusion.    Problems Addressed Today  No diagnosis found.    Assessment and Plan    HFrEF related to ICM  Cardiogenic shock on milrinone @ 0.3 mcg/kg/min   - Last LVEF 30% from echo 09/17/2022  - NYHA class II Stage D  - Dry weight: 165 pounds  Lab Results   Component Value Date    NTPROBNP 14,358.0 (H) 11/01/2022    NTPROBNP 13,442.0 (H) 11/01/2022    NTPROBNP 5,573.0 (H) 10/23/2022       > PLAN:   - euvolemic on exam today, no changes to diuretics  -Will reach out to [facility] team about plan for admission to wean her milrinone and or plan for mitral valve evaluation.  CardioMEMS is currently denied by insurance and in the appeal process  -BMP and NT proBNP via home health tomorrow    GDMT Current Dose Changes made at visit   BB Toprol XL 37.5 mg daily    ACEI/ARB/ARNI No ACE/ARB/ARNI due to renal function    Aldosterone antagonist No MRA due to renal function    SGLT2i Plan to consider at next office visit    Hydralazine/Nitrate NA    Ivabradine NA    HRMT  dual chamber ICD    Diuretics Torsemide 40 mg BID with 5 mg metolazone prn    Cardiac rehab     Candidacy for Adv HF therapies        Renal Surveillance:  - Baseline creatinine ~ 2.0  Lab Results   Component Value Date    CR 2.86 (H) 02/10/2023    CR 2.29 (A) 02/01/2023    CR 2.63 (H) 01/25/2023        Multiple myeloma: Follows at [facility]  She has chronic anemia, her hemoglobin on 02/10/2023 was 10.2  -She follows with  [facility] she was given Darzalex subcutaneous as well as Retacrit.    Atrial Fibrillation  A/c with apixaban 5 mg BID  Treated with Amiodarone 200 mg daily  - if rate uncontrolled, could consider AVN ablation in future    CAD s/p CABG x3 '10, PCI in '10  HLD  Carotid artery disease with prior endarterectomy '16  - PTA Plavix s/p CEA, this was stopped 09/21/22 and started on bASA so intervention by GI could be performed in outpt setting    Mitral Regurgitation  -Plan documented during hospitalization in July was to evaluate patient for MitraClip in the future, however I do not see that this has been completed.  Patient and her son were asking about this today and I will reach out to her team to follow-up with plans for that      DM Type II   >  Lab Results   Component Value Date/Time    HGBA1C 8.0 (H) 09/16/2022 07:02 PM    HGBA1C 7.4 (H) 07/14/2022 02:44 PM        Dyslipidemia  > cont on rosuvastatin 20 mg QHS  Lab Results   Component Value Date/Time    CHOL 107 09/16/2022 07:02 PM    TRIG 72 09/16/2022 07:02 PM    HDL 50 09/16/2022 07:02 PM    LDL 45 09/16/2022 07:02 PM    VLDL 14 09/16/2022 07:02 PM    NONHDLCHOL 57 09/16/2022 07:02 PM               Current Medications (including today's revisions) acetaminophen (TYLENOL) 325 mg tablet Take three tablets by mouth every 8 hours as needed for Pain. Indications: pain    acyclovir (ZOVIRAX) 400 mg tablet Take one tablet by mouth twice daily.    albuterol sulfate (PROAIR HFA) 90 mcg/actuation HFA aerosol inhaler Inhale two puffs by mouth into the lungs every 6 hours as needed for Wheezing or Shortness of Breath.    amiodarone (CORDARONE) 200 mg tablet Take one tablet by mouth daily. Indications: prevention of recurrent atrial fibrillation    apixaban (ELIQUIS) 5 mg tablet Take one tablet by mouth twice daily. Indications: treatment to prevent blood clots in chronic atrial fibrillation    aspirin 81 mg chewable tablet Chew one tablet by mouth daily. Indications: stroke prevention    budesonide-formoterol HFA (SYMBICORT) 160-4.5 mcg/actuation aerosol inhaler Inhale two puffs by mouth into the lungs twice daily. Indications: bronchospasm prevention with COPD    carvediloL (COREG) 6.25 mg tablet Take one tablet by mouth twice daily with meals.    cholecalciferol (VITAMIN D) 1,000 units tablet Take one tablet by mouth daily.    clopiDOGreL (PLAVIX) 75 mg tablet Take one tablet by mouth daily.    cyanocobalamin (VITAMIN B-12) 1,000 mcg tablet Take 5,000 Units by mouth daily.    dexAMETHasone (DECADRON) 4 mg tablet Patient is taking 2 tablets  dicyclomine (BENTYL) 10 mg capsule Take one capsule by mouth daily.    doxycycline hyclate (VIBRAMYCIN) 100 mg capsule Take one capsule by mouth daily.    EMU OIL (BULK) MISC Use  as directed. Apply topically as needed    esomeprazole DR (NEXIUM) 20 mg capsule Take one capsule by mouth daily.    febuxostat (ULORIC) 40 mg tablet Take one-half tablet by mouth daily. Indications: taken on chemo days    ferrous sulfate (FEOSOL) 325 mg (65 mg iron) tablet Take one tablet by mouth daily. Take on an empty stomach at least 1 hour before or 2 hours after food.    insulin detemir U-100 (LEVEMIR FLEXTOUCH) 100 unit/mL (3 mL) injection pen Inject fifteen Units under the skin at bedtime daily.    [name] 10 mg tablet Take one tablet by mouth daily.    metOLazone (ZAROXOLYN) 5 mg tablet Take one tablet by mouth daily as needed.    milrinone (PRIMACOR) 200 mcg/mL infusion Administer 22.68 mcg/min through vein Continuous. Indications: sudden and serious symptoms of heart failure called acute decompensated heart failure    nitroglycerin (NITROSTAT) 0.4 mg tablet Place one tablet under tongue every 5 minutes as needed for Chest Pain.    ONETOUCH VERIO TEST STRIPS test strip USE STRIP TO CHECK GLUCOSE TWICE DAILY FOR DIABETES MELLITUS    pantoprazole DR (PROTONIX) 40 mg tablet Take one tablet by mouth daily. Indications: gastritis    potassium chloride SR (K-DUR) 20 mEq tablet Take three tablets by mouth daily AND two tablets every evening. Indications: prevention of low potassium in the blood    prochlorperazine maleate (COMPAZINE) 10 mg tablet Take one tablet by mouth every 6 hours as needed.    rosuvastatin (CRESTOR) 20 mg tablet Take one tablet by mouth daily.    sertraline (ZOLOFT) 50 mg tablet Take one tablet by mouth daily.    torsemide (DEMADEX) 20 mg tablet Take two tablets by mouth daily. Indications: accumulation of fluid resulting from chronic heart failure            Total Time Today was 50 minutes in the following activities: Preparing to see the patient, Obtaining and/or reviewing separately obtained history, Performing a medically appropriate examination and/or evaluation, Counseling and educating the patient/family/caregiver, Ordering medications, tests, or procedures, Referring and communication with other health care professionals (when not separately reported), and Documenting clinical information in the electronic or other health record     Sue Lush,  CHFN, APRN, NP-C  Advanced Heart Failure APP  [facility]      Collaborating physician Bing Matter, MD

## 2023-02-18 ENCOUNTER — Encounter: Admit: 2023-02-18 | Discharge: 2023-02-18 | Payer: MEDICARE

## 2023-02-18 DIAGNOSIS — Z79899 Other long term (current) drug therapy: Secondary | ICD-10-CM

## 2023-02-18 MED ORDER — APIXABAN 5 MG PO TAB
5 mg | ORAL_TABLET | Freq: Two times a day (BID) | ORAL | 3 refills | Status: AC
Start: 2023-02-18 — End: ?

## 2023-02-18 NOTE — Telephone Encounter
[facility] called reporting that pt is out of eliquis and they will be filling this for one month for pt but asks cardiology to manage going forward. Asks for call back to confirm at [phone]

## 2023-02-24 ENCOUNTER — Encounter: Admit: 2023-02-24 | Discharge: 2023-02-24 | Payer: MEDICARE

## 2023-03-03 ENCOUNTER — Encounter: Admit: 2023-03-03 | Discharge: 2023-03-03 | Payer: MEDICARE

## 2023-03-03 NOTE — Telephone Encounter
[name], Raven  P Cvm - Heart Failure Navigation; P Cvm Nurse Hf Team [facility]  Patient left message on device line regarding her home health nurse needing to speak with her heart failure nurse.  She did not specify clearly what the issue was.  Please reach out to her to discuss this with her further.  I was unable to reach her this morning to attempt to clarify her needs.      Thank you,  Raven/Device Team

## 2023-03-03 NOTE — Telephone Encounter
Spoke to [place]. Reached out to case management team to arrange.

## 2023-03-03 NOTE — Telephone Encounter
[facility] Aundra Millet [phone] called back to discuss pt

## 2023-03-03 NOTE — Telephone Encounter
Returned call to Aundra Millet to inquire about previous orders faxed for patient with no answer. LM requesting callback to triage line.

## 2023-03-03 NOTE — Telephone Encounter
Attempted to call HHRN, no answer. LVM to return call.

## 2023-03-03 NOTE — Telephone Encounter
Returned call to [place] at [phone] and she is needing New start of care orders for pt faxed to [phone]. Pt went on a trip to [place] and new orders are required to resume care.

## 2023-03-03 NOTE — Telephone Encounter
Spoke with pt and she reports [facility] is requesting new orders to see pt. Asks her [facility] Aundra Millet [phone] be contacted with this.

## 2023-03-04 ENCOUNTER — Encounter: Admit: 2023-03-04 | Discharge: 2023-03-04 | Payer: MEDICARE

## 2023-03-07 ENCOUNTER — Encounter: Admit: 2023-03-07 | Discharge: 2023-03-07 | Payer: MEDICARE

## 2023-03-16 ENCOUNTER — Encounter: Admit: 2023-03-16 | Discharge: 2023-03-16 | Payer: MEDICARE

## 2023-03-17 ENCOUNTER — Encounter: Admit: 2023-03-17 | Discharge: 2023-03-17 | Payer: MEDICARE

## 2023-03-19 ENCOUNTER — Encounter: Admit: 2023-03-19 | Discharge: 2023-03-19 | Payer: MEDICARE

## 2023-03-19 ENCOUNTER — Ambulatory Visit: Admit: 2023-03-19 | Discharge: 2023-03-20 | Payer: MEDICARE

## 2023-03-19 DIAGNOSIS — I5022 Chronic systolic (congestive) heart failure: Secondary | ICD-10-CM

## 2023-03-19 MED ORDER — PANTOPRAZOLE 40 MG PO TBEC
40 mg | ORAL_TABLET | Freq: Every day | ORAL | 0 refills
Start: 2023-03-19 — End: ?

## 2023-03-19 MED ORDER — TORSEMIDE 20 MG PO TAB
ORAL_TABLET | ORAL | 3 refills | 67.50000 days | Status: AC
Start: 2023-03-19 — End: ?

## 2023-03-19 NOTE — Progress Notes
Date of Service: 03/19/2023      HPI       I had the pleasure of seeing Raven [name], who is accompanied by her son in the [facility] today for heart failure follow-up. Patient follows with Dr. Vanetta Shawl    Patient is a 77 y.o. 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       She was recently hospitalized at [facility] from 7/7 through 7/16 with a 4 pound weight gain since last office visit with me.  Her milrinone dose was increased to 0.3 mics per kilogram per minute, device interrogation showed ERI, EP was consulted with recommended upgrade of her ICD to CRT-D.  She had leukocytosis, ID was consulted and had no contraindications for GEN change and device upgrade.  She was transition to torsemide 40 mg daily in addition she also had significant anemia with a hemoglobin of 6.6 requiring blood transfusion.  On 7/15 her CBC for her milrinone became dislodged, she underwent replacement in interventional radiology on 7/16.  She was recommended to have outpatient follow-up with GI for EGD/EUS with polypectomy for gastric and duodenal polyps and needs an incision check .     She was last seen in clinic on 10/22 by Fara Boros, at that office visit she had reached out to choral team regarding admission to wean milrinone or evaluate for mitral valve intervention.    She has [facility] home [facility] who had contacted her this morning.  She had had chemotherapy yesterday and took dexamethasone, her blood sugar yesterday was up to the 400s but had improved.    TODAY'S VISIT        The patient, with a history of diabetes, heart disease, and cancer, presents with a recent increase in blood glucose levels following weekly dexamethasone administration. She reports that her blood sugar typically rises to around 400 post-dexamethasone, but returns to normal ranges (85) by the following morning. She also mentions occasional chest tightness, which she manages with nitroglycerin as needed. The last episode of chest tightness was approximately a week ago, and was not associated with weight gain.    The patient also reports occasional hematuria, which was recently addressed during a chemotherapy session. She has a history of hemorrhoids, which could potentially contribute to this symptom.    The patient's creatinine levels have been steadily increasing, currently at 2.9, despite attempts to increase water intake. She is currently on a regimen of amiodarone, Eliquis, Plavix, [name], and diuretics (torsemide and metolazone). The metolazone is taken as needed, and has not been used recently.    The patient is also on a milrinone infusion pump, and there has been discussion about potentially discontinuing this treatment. The patient's ejection fraction was noted to be 30% in a recent echocardiogram, and she has moderate to severe mitral valve regurgitation.    The patient's cancer treatment appears to be going well, with a stable white blood cell count. However, her hemoglobin levels have been slowly decreasing, currently at 8.6. As a result, the frequency of her chemotherapy sessions has been increased to every two weeks.         Vitals:  03/19/23 1022   BP: 106/62   BP Source: Arm, Right Upper   Pulse: 104   SpO2: 98%   O2 Device: None (Room air)   PainSc: Zero   Weight: 77 kg (169 lb 12.8 oz)   Height: 157.5 cm (5' 2)             Body mass index is 31.06 kg/m?Marland Kitchen    Past Medical History  Patient Active Problem List    Diagnosis Date Noted    Paroxysmal atrial fibrillation (HCC) 02/11/2023    Anticoagulated 02/11/2023    On amiodarone therapy 02/11/2023    ICD (implantable cardioverter-defibrillator) in place 02/11/2023    Chronic systolic heart failure (HCC) 11/09/2022    Acute gastritis without hemorrhage 09/23/2022    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    Goals of care, counseling/discussion 09/22/2022    Receiving inotropic medication 09/19/2022    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 09/19/2022    Mitral valve insufficiency 09/19/2022    Abnormal finding on imaging 09/18/2022    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    Gastric polyp 09/18/2022    Polyp of duodenum 09/18/2022    Diverticulitis 08/10/2022    History of MI (myocardial infarction) 05/21/2022    Acute cystitis without hematuria 03/18/2022    Colitis 03/18/2022    Compression fracture of L1 lumbar vertebra (HCC) 03/18/2022    Leukocytosis 03/18/2022    Intractable low back pain 03/17/2022    Chronic kidney disease, 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/2022    Hypokalemia 11/25/2021    S/P drug eluting coronary stent placement 08/21/2021    Benign essential hypertension 08/21/2021    Left carotid stenosis 08/21/2021    Malignant neoplasm of uterus (HCC) 08/21/2021    Obstructive sleep apnea syndrome 08/21/2021    Coronary artery disease involving native coronary artery of native heart without angina pectoris 08/12/2021    Cardiomyopathy, 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/12/2021    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    Multiple myeloma (HCC) 01/05/2020    Type 2 diabetes mellitus without complications (HCC) 12/06/2018    Chronic obstructive pulmonary disease, unspecified (HCC) 12/06/2018    Dyslipidemia 12/06/2018       ROS:See HPI      Physical Exam  General Appearance: In NAD, pale  Neck Veins: normal JVP, neck veins are not distended; no HJR   Chest Inspection: right SCL dressing CDI, slight redness noted at insertion site, no drainage or palpable cord noted  Respiratory Effort: breathing comfortably, no respiratory distress   Auscultation/Percussion: lungs clear to auscultation, no rales, rhonchi or wheezing   Cardiac Rhythm: regular rhythm and tachycardic rate   Cardiac Auscultation: S1, S2 normal, no rub, no definite S3  or S4   Murmurs: Grade 2 systolic murmur   Peripheral Circulation: normal peripheral circulation   Pedal Pulses: normal symmetric pedal pulses   Lower Extremity Edema: No lower extremity edema   Abdominal Exam: soft, non-tender, no obvious masses, bowel sounds normal   Gait & Station: In a wheelchair today  Orientation: oriented to person, place and time   Affect & Mood: appropriate and sustained affect   Language and Memory: patient responsive and seems to comprehend information   Neurologic Exam: neurological assessment grossly intact   Vital signs were reviewed      Cardiovascular Studies:Echocardiographic Findings 09/18/2022   Left Ventricle The left ventricular wall thickness is normal. Normal geometry. The left ventricular systolic function is moderately reduced. The visually estimated ejection fraction is 30%. Unable to assess left  ventricular diastolic function. Unable to assess left atrial pressure.   Right Ventricle The right ventricle is mildly dilated. The right ventricular systolic function is mildly reduced. Pacemaker lead present in the ventricle.   Left Atrium Mildly dilated.   Right Atrium Not well seen. Normal size. Pacemaker lead present in the right atrium.   IVC/SVC Inferior vena cava was not well seen; right atrial pressure could not be estimated.   Mitral Valve No stenosis. Moderate to severe regurgitation. There is mild mitral annular calcification without stenosis.   Tricuspid Valve Valve has a dilated annulus. No stenosis. Severe regurgitation.   Aortic Valve The aortic valve was not well seen. Trace regurgitation.   Pulmonary The pulmonic valve was not seen well but no Doppler evidence of stenosis.   Aorta The ascending aorta is mildly dilated.   Pericardium No pericardial effusion.      5/24 RHC RA 13, RV 70/12, PA 72/27/47, PCWP 17, CO 1.9, CI 1.1 via FICK, CO 2.5, CI 1.4 via thermo with PA sat 41%.      Gastric biopsy 09/17/22:  Oxyntic-type mucosa with focally active gastritis.   Antral-type mucosa with features of reactive gastropathy.   Immunohistochemical stain for H. pylori is negative.      Problems Addressed Today  No diagnosis found.             Assessment/Plan:  HFrEF due to ICM  The most recent echo as noted above.    The patient today describes NYHA Functional Class III, stage C symptoms.    >Diuretic Therapy   Current Dose:  torsemide 40mg  daily with as needed metolazone Changes:   11/22: Will make torsemide 20 mg daily with extra 20 mg Monday Wednesday Friday, chemistry in 1 week.     She appears euvolemic today, clinic weight is 164 pounds, dry weight has been 165 pounds..  I reviewed most recent labs and most recent cardiac testing with patient and family.  Shared medical decision making involves eliciting patient and/or family preferences, education and explaining risks and benefits of management options.  HF Therapy:   GDMT Current Dose Changes made at visit   BB Toprol-XL 37.5 mg daily    ACEI/ARB/ARNI None, CKD    Aldosterone antagonist None, CKD    SGLT2i None PTA, at next office visit can consider addition of this    Hydralazine/Nitrate Not applicable, on inotropes    Ivabradine Not applicable    Cardiac rehab Not applicable    Advanced Therapies Candidacy/Contraindications   N/A            Ambulatory cardiogenic shock: She underwent right heart cath on 09/18/2022 that showed cardiac index of 1.1, she was started on milrinone, currently on 0.30 mcg/kg/min  -Will send a message to Dr. [name], at his last appointment in September he had discussed possibly coming off of inotropes after she had returned from her trip to [facility].  Will plan to bring her in when he is next on service in January.  She does have mitral regurgitation that is described as moderate to severe, unsure if she would qualify for a MitraClip.    CRT-D in situ: Last device interrogation on 02/15/2023 showed heart failure diagnostics were stable.    Multiple myeloma: Follows at [facility], was seen during this admission by Dr. Mitchell Heir.    She had a repeat CBC done at [facility] On 11/21 that shows gradual downtrending, hemoglobin was 8.8, hematocrit 27.2.  -She follows with  [facility] oncology she  was given Darzalex subcutaneous as well as Retacrit.     -  Lab Results   Component Value Date    KAPPAFLC 1.65 09/19/2022    LAMBDAFLC 0.81 09/19/2022    FREELCRAT 2.04 (H) 09/19/2022    IMMUNOFIXS  09/19/2022     NO PARAPROTEIN SEEN  CONFIRMED BY IMMUNOFIXATION GEL      NTPROBNP 3,269 (H) 02/17/2023    NTPROBNP 14,358.0 (H) 11/01/2022    NTPROBNP 13,442.0 (H) 11/01/2022      Atrial fibrillation; last EKG on 6/5 showed sinus tachycardia with PVCs.  - Rates 90's -100's  > Continue Eliquis 5mg  BID   > Toprol as above           CAD s/p CABG x3 '10, PCI in '10  HLD  Carotid artery disease with prior endarterectomy '16  - PTA Plavix s/p CEA, this was stopped 09/21/22 and started on bASA so intervention by GI could be performed in outpt setting       COPD ,OSA with CPAP use  - PTA Proair    Hx diverticulosis s/p sigmoid colon mass resection 08/10/22  Pancreatic cysts  Fat stranding at gastric outlet/proximal duodenum  - Recent surgery with surgical oncology for colonic mass resection, found to be diverticulitis. No signs of malignancy.  - BC on admission Negative   - CT abd/pelv 5/22: RUQ fat abutting the gastric outlet, possibly gastritis or duodenitis. Several cystic lesions in tail of pancreas  - EGD 5/23: normal esophagus, 3cm hiatal hernia, congestive gastropathy, erythematous mucosa biopsied in prepyloric region, 6mm sessile polyp in gastric  body, 2 polyps in duodenum, poylps not removed due to Plavix taken 5/21  - No surgical intervention per surgical oncology        CKD stage IV: Most recent chemistry at [facility] on 11/21 shows creatinine 2.92 with a BUN 46.  Lab Results   Component Value Date    CR 2.54 (A) 03/09/2023    CR 2.54 (A) 02/17/2023    CR 2.86 (H) 02/10/2023           Diabetes Mellitus Type II- Managed by Primary team.    Lab Results   Component Value Date/Time    HGBA1C 8.0 (H) 09/16/2022 07:02 PM    HGBA1C 7.4 (H) 07/14/2022 02:44 PM         Lab Results   Component Value Date    CHOL 107 09/16/2022    TRIG 72 09/16/2022    HDL 50 09/16/2022    LDL 45 09/16/2022    VLDL 14 09/16/2022    NONHDLCHOL 57 09/16/2022       Follow up appointment:   With me on 1/6.    I have personally documented the HPI, exam and medical decision making.  Patient education: I reviewed recent lab results and current medications, medication instructions, discussed heart failure signs & symptoms,  low  sodium diet, fluid restriction and daily weights.I have instructed the patient on the plan of care and they verbalize understanding of the plan. Please see AVS for full patient teaching. Patient advised to call our office if s/he has any problems, questions, worsening symptoms, or concerns prior to the next appointment.   Thanks for allowing me to see this nice patient. If I can be of additional assistance, please don't hesitate to contact me.     Current Outpatient Medications on File Prior to Visit   Medication Sig Dispense Refill    acetaminophen (TYLENOL) 325 mg tablet Take three tablets by  mouth every 8 hours as needed for Pain. Indications: pain 40 tablet 0    acyclovir (ZOVIRAX) 400 mg tablet Take one tablet by mouth twice daily.      albuterol sulfate (PROAIR HFA) 90 mcg/actuation HFA aerosol inhaler Inhale two puffs by mouth into the lungs every 6 hours as needed for Wheezing or Shortness of Breath.      amiodarone (CORDARONE) 200 mg tablet Take one tablet by mouth daily. Indications: prevention of recurrent atrial fibrillation 90 tablet 1    apixaban (ELIQUIS) 5 mg tablet Take one tablet by mouth twice daily. Indications: treatment to prevent blood clots in chronic atrial fibrillation 180 tablet 3    aspirin 81 mg chewable tablet Chew one tablet by mouth daily. Indications: stroke prevention      budesonide-formoterol HFA (SYMBICORT) 160-4.5 mcg/actuation aerosol inhaler Inhale two puffs by mouth into the lungs twice daily. Indications: bronchospasm prevention with COPD 30.6 g 0    carvediloL (COREG) 6.25 mg tablet Take one tablet by mouth twice daily with meals.      cholecalciferol (VITAMIN D) 1,000 units tablet Take one tablet by mouth daily.      clopiDOGreL (PLAVIX) 75 mg tablet Take one tablet by mouth daily.      cyanocobalamin (VITAMIN B-12) 1,000 mcg tablet Take 5,000 Units by mouth daily.      dexAMETHasone (DECADRON) 4 mg tablet Patient is taking 2 tablets      dicyclomine (BENTYL) 10 mg capsule Take one capsule by mouth daily.      doxycycline hyclate (VIBRAMYCIN) 100 mg capsule Take one capsule by mouth daily.      empagliflozin ([name]) 25 mg tablet Take 10 mg by mouth daily.      EMU OIL (BULK) MISC Use  as directed. Apply topically as needed      esomeprazole DR (NEXIUM) 20 mg capsule Take one capsule by mouth daily.      febuxostat (ULORIC) 40 mg tablet Take one-half tablet by mouth daily. Indications: taken on chemo days 45 tablet 0    ferrous sulfate (FEOSOL) 325 mg (65 mg iron) tablet Take one tablet by mouth daily. Take on an empty stomach at least 1 hour before or 2 hours after food.      insulin detemir U-100 (LEVEMIR FLEXTOUCH) 100 unit/mL (3 mL) injection pen Inject fifteen Units under the skin at bedtime daily. (Patient not taking: Reported on 03/19/2023)      metOLazone (ZAROXOLYN) 5 mg tablet Take one tablet by mouth daily as needed. (Patient not taking: Reported on 03/19/2023)      milrinone (PRIMACOR) 200 mcg/mL infusion Administer 22.68 mcg/min through vein Continuous. Indications: sudden and serious symptoms of heart failure called acute decompensated heart failure      nitroglycerin (NITROSTAT) 0.4 mg tablet Place one tablet under tongue every 5 minutes as needed for Chest Pain. (Patient not taking: Reported on 03/19/2023) ONETOUCH VERIO TEST STRIPS test strip USE STRIP TO CHECK GLUCOSE TWICE DAILY FOR DIABETES MELLITUS      pantoprazole DR (PROTONIX) 40 mg tablet Take one tablet by mouth daily. Indications: gastritis 90 tablet 1    potassium chloride SR (K-DUR) 20 mEq tablet Take three tablets by mouth daily AND two tablets every evening. Indications: prevention of low potassium in the blood 420 tablet 1    prochlorperazine maleate (COMPAZINE) 10 mg tablet Take one tablet by mouth every 6 hours as needed. (Patient not taking: Reported on 03/19/2023)      rosuvastatin (CRESTOR) 20 mg  tablet Take one tablet by mouth daily.      sertraline (ZOLOFT) 50 mg tablet Take one tablet by mouth daily.      torsemide (DEMADEX) 20 mg tablet Take two tablets by mouth daily. Indications: accumulation of fluid resulting from chronic heart failure 360 tablet 3     No current facility-administered medications on file prior to visit.       [name] AGPCNP-BC, CHFN,HF-Cert  Advanced Heart Failure APP  [facility]  Collaborating physician: Dr. Vanetta Shawl      Plan:  The following is a copy of the written instructions and plan I gave to the patient during her office visit.    Patient Instructions   Thank you for coming to [facility], it is a pleasure to be a part of your Heart Care Team.    Your therapy plan of care was reviewed with you by [place] .    At today's visit we discussed your heart failure diagnosis and treatment plan options.  Here is the summary of the treatment plan we discussed:    Medications:    Labs    Testing/ Therapy:    Follow up in the Heart Failure Clinic/ Cardiology:    If you are being seen in the Heart Failure clinic:   Follow a 2000 mg sodium diet and 2-liter fluid restriction.    Call for any worsening symptoms of shortness of breath, swelling, sudden weight gain, lightheadedness, heart racing, or chest pain.  Weigh yourself daily and call us if your weight increase is greater than 3 pounds overnight OR 5 pounds in one week.   It is helpful for you to bring your list of current medications with you to all clinic visit.     Contacting our office:     -Business Hours: Monday-Friday, 8:00 am-4:30 pm (excluding Holidays).      -For medical questions or concerns, please send [place] a message through your MyChart account or call the Advance Heart Failure team nursing triage line at [phone]. Please leave a detailed message with your name, date of birth, and reason for your call.  If your message is received before 3:30pm, every effort will be made to call you back the same day.  Please allow time for [place] to review your chart prior to call back.      -For medication refills please contact your pharmacy. You can also send [place] a prescription question through your MyChart or call the nurse triage line above.      -To schedule any follow up appointments or to change an existing appointment please contact our scheduling department at [phone].     Below are [place] outpatient lab locations that do not require an appointment. You can walk in during the times below:     [facility]: [address] [place], [place] [address] Phone: [phone] M-F 7 a.m. -6 p.m. Saturday 7 a.m. - noon   [facility]: [address] [place], [place] [address]  Phone: [phone] M-F 7:30 a.m. - 5 p.m.   [facility]: [address] [place], [place] Phone: [phone] M-F 8 a.m. - 4:30 p.m.   [place]: [address], [place], [place] [address]   Phone: [phone] M-F  7 a.m. - 5:30 p.m. Sat-Sun: 7 a.m. - noon               [facility]: [address] [place], [place] [address] Phone [phone] M-F 8 a.m. - 4:30 p.m.   [facility]: [address] [place], [place] [address] Phone [phone] M-Th  7 a.m. - 6:30 p.m. Fri: 7 a.m. - 5 p.m.   [facility]: [address], [place] [address] Phone [phone] M-F  7 a.m. - 5:30 p.m.    Team [place] Providers:                                                                                                                                    Dr. Vanetta Shawl, MD and Dr. Lyndel Safe, MD   [name], AGCNP-BC, [facility], HF-Cert                         Ivory Broad, APRN, ANP-C, [facility]                                          [place], [place]                                                 Nurse Team: Elayne Snare, [place] Poore, Starlyn Skeans, [facility]  [address] [place], [place] [address]  Phone: [phone] Fax: [phone]    At the [facility], you and your family are our top priority.  You may receive a survey via email or text message that we are asking you to complete.  We value your feedback to ensure you are satisfied with every visit and we are continually providing the highest quality of patient care.  We know your time is valuable and thank you in advance for completing the survey.                   Future Appointments   Date Time Provider Department Center   09/16/2023  1:30 PM Vanetta Shawl I, MD [facility] CVM Exam   09/17/2023  2:30 PM [id number] PACEMAKER CVM[facility]RTOP CVM Procedur   09/17/2023  3:00 PM Emert, Catalina Pizza, MD CVMCLOP CVM Exam

## 2023-03-19 NOTE — Telephone Encounter
Can you please send request to PCP?

## 2023-03-19 NOTE — Telephone Encounter
Call placed to [facility] and requested that they send [place] patient's labs on 03/30/23 as follow up from 03/19/23 office visit and Torsemide change. Greig Castilla with [facility] informed this RN that they would fax 03/30/23 labs to [place].

## 2023-03-23 ENCOUNTER — Encounter: Admit: 2023-03-23 | Discharge: 2023-03-23 | Payer: MEDICARE

## 2023-03-24 ENCOUNTER — Encounter: Admit: 2023-03-24 | Discharge: 2023-03-24 | Payer: MEDICARE

## 2023-03-24 ENCOUNTER — Inpatient Hospital Stay: Admit: 2023-03-24 | Discharge: 2023-04-01 | Disposition: A | Discharge status: Home Health | Payer: MEDICARE

## 2023-03-24 DIAGNOSIS — D509 Iron deficiency anemia, unspecified: Secondary | ICD-10-CM

## 2023-03-24 DIAGNOSIS — K922 Gastrointestinal hemorrhage, unspecified: Secondary | ICD-10-CM

## 2023-03-24 LAB — URINALYSIS DIPSTICK REFLEX TO CULTURE
~~LOC~~ BKR LEUKOCYTES: NEGATIVE
~~LOC~~ BKR NITRITE: NEGATIVE
~~LOC~~ BKR PROTEIN,UA: NEGATIVE /HPF
~~LOC~~ BKR URINE BILE: NEGATIVE
~~LOC~~ BKR URINE BLOOD: NEGATIVE
~~LOC~~ BKR URINE KETONE: NEGATIVE
~~LOC~~ BKR URINE PH: 6 /HPF (ref 5.0–8.0)
~~LOC~~ BKR URINE SPEC GRAVITY: 1 /HPF (ref 1.005–1.030)

## 2023-03-24 LAB — CBC AND DIFF
~~LOC~~ BKR ABSOLUTE BASO COUNT: 0.1 10*3/uL (ref 0.00–0.20)
~~LOC~~ BKR ABSOLUTE EOS COUNT: 1.2 10*3/uL — ABNORMAL HIGH (ref 0.00–0.45)
~~LOC~~ BKR BASOPHILS %: 1 % (ref 0–2)
~~LOC~~ BKR EOSINOPHILS %: 8 % — ABNORMAL HIGH (ref 0–5)
~~LOC~~ BKR LYMPHOCYTES %: 14 % — ABNORMAL LOW (ref 24–44)
~~LOC~~ BKR MONOCYTES %: 9 % (ref 4–12)
~~LOC~~ BKR MPV: 8.4 fL (ref 7.0–11.0)
~~LOC~~ BKR NEUTROPHILS %: 68 % (ref 41–77)
~~LOC~~ BKR PLATELET COUNT: 315 10*3/uL (ref 150–400)
~~LOC~~ BKR RDW: 23 % — ABNORMAL HIGH (ref 11.0–15.0)
~~LOC~~ BKR WBC COUNT: 14 10*3/uL — ABNORMAL HIGH (ref 4.50–11.00)

## 2023-03-24 MED ORDER — MILRINONE IN 5 % DEXTROSE 20 MG/100 ML (200 MCG/ML) IV PGBK
.3 ug/kg/min | INTRAVENOUS | 0 refills | Status: DC
Start: 2023-03-24 — End: 2023-03-25

## 2023-03-24 MED ORDER — MILRINONE IN 5 % DEXTROSE 20 MG/100 ML (200 MCG/ML) IV PGBK
.3 ug/kg/min | INTRAVENOUS | 0 refills | Status: DC
Start: 2023-03-24 — End: 2023-04-01
  Administered 2023-03-25 – 2023-03-31 (×11): 0.3 ug/kg/min via INTRAVENOUS

## 2023-03-24 MED ORDER — ONDANSETRON 4 MG PO TBDI
4 mg | ORAL | 0 refills | Status: DC | PRN
Start: 2023-03-24 — End: 2023-04-01

## 2023-03-24 MED ORDER — SERTRALINE 50 MG PO TAB
50 mg | Freq: Every day | ORAL | 0 refills | Status: DC
Start: 2023-03-24 — End: 2023-04-01
  Administered 2023-03-25 – 2023-03-31 (×6): 50 mg via ORAL

## 2023-03-24 MED ORDER — BUDESONIDE-FORMOTEROL 160-4.5 MCG/ACTUATION IN HFAA
2 | Freq: Every day | RESPIRATORY_TRACT | 0 refills | Status: DC
Start: 2023-03-24 — End: 2023-03-25

## 2023-03-24 MED ORDER — INSULIN ASPART 100 UNIT/ML SC FLEXPEN
0-6 [IU] | Freq: Before meals | SUBCUTANEOUS | 0 refills | Status: DC
Start: 2023-03-24 — End: 2023-04-01
  Administered 2023-03-25: 17:00:00 4 [IU] via SUBCUTANEOUS

## 2023-03-24 MED ORDER — ONDANSETRON HCL (PF) 4 MG/2 ML IJ SOLN
4 mg | INTRAVENOUS | 0 refills | Status: DC | PRN
Start: 2023-03-24 — End: 2023-04-01
  Administered 2023-03-27: 16:00:00 4 mg via INTRAVENOUS

## 2023-03-24 MED ORDER — PANTOPRAZOLE 40 MG IV SOLR
80 mg | Freq: Once | INTRAVENOUS | 0 refills | Status: CP
Start: 2023-03-24 — End: ?
  Administered 2023-03-24: 21:00:00 80 mg via INTRAVENOUS

## 2023-03-24 MED ORDER — MELATONIN 5 MG PO TAB
5 mg | Freq: Every evening | ORAL | 0 refills | Status: DC | PRN
Start: 2023-03-24 — End: 2023-04-01
  Administered 2023-03-28 – 2023-03-31 (×4): 5 mg via ORAL

## 2023-03-24 MED ORDER — ACYCLOVIR 400 MG PO TAB
400 mg | Freq: Two times a day (BID) | ORAL | 0 refills | Status: DC
Start: 2023-03-24 — End: 2023-04-01
  Administered 2023-03-25 – 2023-03-31 (×14): 400 mg via ORAL

## 2023-03-24 MED ORDER — ACETAMINOPHEN 325 MG PO TAB
650 mg | ORAL | 0 refills | Status: DC | PRN
Start: 2023-03-24 — End: 2023-04-01

## 2023-03-24 MED ORDER — AMIODARONE 200 MG PO TAB
200 mg | Freq: Every day | ORAL | 0 refills | Status: DC
Start: 2023-03-24 — End: 2023-04-01
  Administered 2023-03-25 – 2023-03-31 (×7): 200 mg via ORAL

## 2023-03-24 MED ORDER — CARVEDILOL 6.25 MG PO TAB
6.25 mg | Freq: Two times a day (BID) | ORAL | 0 refills | Status: DC
Start: 2023-03-24 — End: 2023-03-25
  Administered 2023-03-25: 04:00:00 6.25 mg via ORAL

## 2023-03-24 MED ORDER — DEXTROSE 50 % IN WATER (D50W) IV SYRG
12.5-25 g | INTRAVENOUS | 0 refills | Status: DC | PRN
Start: 2023-03-24 — End: 2023-04-01

## 2023-03-24 MED ORDER — DOXYCYCLINE HYCLATE 100 MG PO TAB
100 mg | Freq: Every day | ORAL | 0 refills | Status: DC
Start: 2023-03-24 — End: 2023-03-25

## 2023-03-24 MED ORDER — BUDESONIDE-FORMOTEROL 160-4.5 MCG/ACTUATION IN HFAA
2 | Freq: Two times a day (BID) | RESPIRATORY_TRACT | 0 refills | Status: DC
Start: 2023-03-24 — End: 2023-04-01
  Administered 2023-03-25: 03:00:00 2 via RESPIRATORY_TRACT

## 2023-03-24 MED ORDER — DEXAMETHASONE 4 MG PO TAB
4 mg | Freq: Every day | ORAL | 0 refills | Status: DC
Start: 2023-03-24 — End: 2023-03-25

## 2023-03-24 MED ORDER — ALBUTEROL SULFATE 90 MCG/ACTUATION IN HFAA
2 | RESPIRATORY_TRACT | 0 refills | Status: DC | PRN
Start: 2023-03-24 — End: 2023-04-01

## 2023-03-24 MED ORDER — POLYETHYLENE GLYCOL 3350 17 GRAM PO PWPK
1 | Freq: Every day | ORAL | 0 refills | Status: DC | PRN
Start: 2023-03-24 — End: 2023-04-01

## 2023-03-24 MED ORDER — PANTOPRAZOLE 40 MG IV SOLR
40 mg | Freq: Two times a day (BID) | INTRAVENOUS | 0 refills | Status: DC
Start: 2023-03-24 — End: 2023-04-01
  Administered 2023-03-25 – 2023-03-31 (×14): 40 mg via INTRAVENOUS

## 2023-03-24 MED ORDER — ROSUVASTATIN 10 MG PO TAB
20 mg | Freq: Every day | ORAL | 0 refills | Status: DC
Start: 2023-03-24 — End: 2023-03-29
  Administered 2023-03-25 – 2023-03-28 (×4): 20 mg via ORAL

## 2023-03-24 MED ORDER — SENNOSIDES-DOCUSATE SODIUM 8.6-50 MG PO TAB
1 | Freq: Every day | ORAL | 0 refills | Status: DC | PRN
Start: 2023-03-24 — End: 2023-04-01
  Administered 2023-03-29: 21:00:00 1 via ORAL

## 2023-03-24 NOTE — Telephone Encounter
Weekly labs received from [facility] and patient's Hemoglobin is critical at 6.8. Notified Dr. Herma Carson. [name] in clinic-- patient is to proceed to the emergency department for evaluation.

## 2023-03-24 NOTE — Telephone Encounter
Called and spoke with patient on authorized line using two patient identifiers. Informed patient that her hemoglobin was critically low at 6.8 and that she needed to proceed to the emergency department. Patient stated that she wondered why she had been so fatigued. Explained to patient that this could be due to her low hemoglobin. Patient stated that she would proceed to [facility] emergency department.

## 2023-03-24 NOTE — ED Notes
Pt arrived to the ED A&Ox4 with a CC of low hemoglobin. Pt states she had labs drawn by her PCP d/t increased lethargy over the past several weeks. Hemoglobin from clinic was 6 so pt was sent to ED for further evaluation. Pt endorses taking blood thinners d/t a previous MI, and has a hx of cancer requiring blood transfusions in the past. Pt denies any SOB and endorses increased weakness, lethargy, and dizziness.    Pt denies CP, dizziness, SOA, palpitation, abdominal pain, fevers, chills, and N/V.     Pt resting in bed, call light within reach, bed locked and in the lowest position.     Past Medical History:   Diagnosis Date    Anxiety     Arthritis     Asthma     Asymptomatic stenosis of right carotid artery     Back pain     Cancer of uterus (HCC)     COPD (chronic obstructive pulmonary disease) (HCC)     Coronary artery disease     Diabetes mellitus (HCC)     Dyslipidemia     Flank mass     Fluid retention     Heart disease     Hypertension     Other malignant neoplasm without specification of site     Sleep apnea     Compliant w CPAP    Vision decreased

## 2023-03-24 NOTE — Consults
Gastroenterology Consult Note  Patient Name:Sinai Genowefa Elkhatib         XBJ:[id number]  Admission Date: 03/24/2023  1:41 PM    CONSULT GASTROENTEROLOGY PHYSICIAN  Consult performed by: Albin Fischer, MD  Consult ordered by: Lorin Glass, PA-C              Active Problems:    * No active hospital problems. *      History of Present Illness/Subjective:  Willie Mehan is a 77 y.o. female with history of Cancer of uterus, CAD, DM, Dyslipidemia, Fluid retention, Heart disease, and HTN who presents to the ED for low hemoglobin. Hb was 6,8 yesterday, today is 7.3. Per patient she had one black tarry stool this AM. Denies previous history of GI bleed. Denies NSAID use, tobacco, alcohol or drugs. Is on aspirin and plavix. Denies abdominal pain, diarrhea, nausea or vomiting. Patient is currently HD stable.     EGD 5/24:  Gastroesophageal flap valve classified as Hill Grade III. Congestive gastropathy. Erythematous mucosa in the prepyloric region of the stomach. Biopsied. A single 6 mm sessile polyp with no bleeding and no stigmata of recent bleeding  A single 6 mm sessile polyp with no bleeding in the first portion of the duodenum. A single 15 mm pedunculated polyp with no   bleeding was found in the second portion of the duodenum.       Assessment/ Plan:    Melena  Acute on chronic anemia  On DAPT      Recommendations:  - Hold off on EGD at this time  - IV PPI 40mg  BID  - Continue to monitor hb, transfuse if <7  - GI will continue to follow her through the weekend, if continues to have drop in hb or hd unstable, will plan for EGD either during the weekend or early next week   - If HD unstable bleed please call GI fellow on call.     Patient seen/discussed with Dr. Donetta Potts, MD  Available on [facility]  Fellow, Gastroenterology and Hepatology  PGY-4      03/24/2023 4:01 PM       -----------------------------  PMH:  Past Medical History:   Diagnosis Date    Anxiety     Arthritis Asthma     Asymptomatic stenosis of right carotid artery     Back pain     Cancer of uterus (HCC)     COPD (chronic obstructive pulmonary disease) (HCC)     Coronary artery disease     Diabetes mellitus (HCC)     Dyslipidemia     Flank mass     Fluid retention     Heart disease     Hypertension     Other malignant neoplasm without specification of site     Sleep apnea     Compliant w CPAP    Vision decreased        Current medications:  No current facility-administered medications on file prior to encounter.     Current Outpatient Medications on File Prior to Encounter   Medication Sig Dispense Refill    acetaminophen (TYLENOL) 325 mg tablet Take three tablets by mouth every 8 hours as needed for Pain. Indications: pain 40 tablet 0    acyclovir (ZOVIRAX) 400 mg tablet Take one tablet by mouth twice daily.      albuterol sulfate (PROAIR HFA) 90 mcg/actuation HFA aerosol inhaler Inhale two puffs by mouth into the lungs  every 6 hours as needed for Wheezing or Shortness of Breath.      amiodarone (CORDARONE) 200 mg tablet Take one tablet by mouth daily. Indications: prevention of recurrent atrial fibrillation 90 tablet 1    apixaban (ELIQUIS) 5 mg tablet Take one tablet by mouth twice daily. Indications: treatment to prevent blood clots in chronic atrial fibrillation 180 tablet 3    aspirin 81 mg chewable tablet Chew one tablet by mouth daily. Indications: stroke prevention      budesonide-formoterol HFA (SYMBICORT) 160-4.5 mcg/actuation aerosol inhaler Inhale two puffs by mouth into the lungs twice daily. Indications: bronchospasm prevention with COPD 30.6 g 0    carvediloL (COREG) 6.25 mg tablet Take one tablet by mouth twice daily with meals.      cholecalciferol (VITAMIN D) 1,000 units tablet Take one tablet by mouth daily.      clopiDOGreL (PLAVIX) 75 mg tablet Take one tablet by mouth daily.      cyanocobalamin (VITAMIN B-12) 1,000 mcg tablet Take 5,000 Units by mouth daily.      dexAMETHasone (DECADRON) 4 mg tablet Patient is taking 2 tablets      dicyclomine (BENTYL) 10 mg capsule Take one capsule by mouth daily.      doxycycline hyclate (VIBRAMYCIN) 100 mg capsule Take one capsule by mouth daily.      empagliflozin ([name]) 25 mg tablet Take 10 mg by mouth daily.      EMU OIL (BULK) MISC Use  as directed. Apply topically as needed      esomeprazole DR (NEXIUM) 20 mg capsule Take one capsule by mouth daily.      febuxostat (ULORIC) 40 mg tablet Take one-half tablet by mouth daily. Indications: taken on chemo days 45 tablet 0    ferrous sulfate (FEOSOL) 325 mg (65 mg iron) tablet Take one tablet by mouth daily. Take on an empty stomach at least 1 hour before or 2 hours after food.      insulin detemir U-100 (LEVEMIR FLEXTOUCH) 100 unit/mL (3 mL) injection pen Inject fifteen Units under the skin at bedtime daily. (Patient not taking: Reported on 03/19/2023)      metOLazone (ZAROXOLYN) 5 mg tablet Take one tablet by mouth daily as needed. (Patient not taking: Reported on 03/19/2023)      milrinone (PRIMACOR) 200 mcg/mL infusion Administer 22.68 mcg/min through vein Continuous. Indications: sudden and serious symptoms of heart failure called acute decompensated heart failure      nitroglycerin (NITROSTAT) 0.4 mg tablet Place one tablet under tongue every 5 minutes as needed for Chest Pain. (Patient not taking: Reported on 03/19/2023)      ONETOUCH VERIO TEST STRIPS test strip USE STRIP TO CHECK GLUCOSE TWICE DAILY FOR DIABETES MELLITUS      pantoprazole DR (PROTONIX) 40 mg tablet Take one tablet by mouth daily. Indications: gastritis 90 tablet 1    potassium chloride SR (K-DUR) 20 mEq tablet Take three tablets by mouth daily AND two tablets every evening. Indications: prevention of low potassium in the blood 420 tablet 1    prochlorperazine maleate (COMPAZINE) 10 mg tablet Take one tablet by mouth every 6 hours as needed. (Patient not taking: Reported on 03/19/2023)      rosuvastatin (CRESTOR) 20 mg tablet Take one tablet by mouth daily.      sertraline (ZOLOFT) 50 mg tablet Take one tablet by mouth daily.      torsemide (DEMADEX) 20 mg tablet Take 20 mg daily and an extra 20 mg on MWF.  Indications: accumulation of fluid resulting from chronic heart failure 360 tablet 3       PSH:  Surgical History:   Procedure Laterality Date    ROBOT ASSISTED LOW ANTERIOR RESECTION N/A 08/10/2022    Performed by Benetta Spar, MD at [facility] OR    ESOPHAGOGASTRODUODENOSCOPY WITH BIOPSY - FLEXIBLE N/A 09/17/2022    Performed by Tempie Hoist, DO at [facility] ENDO    CATHETERIZATION RIGHT HEART N/A 09/18/2022    Performed by Cath, Physician at [facility] CATH LAB    REMOVAL AND REPLACEMENT IMPLANTABLE DEFIBRILLATOR GENERATOR - SINGLE LEAD SYSTEM Left 11/04/2022    Performed by Kathreen Cornfield, MD at [facility] EP LAB    INSERTION/ REPLACEMENT PERMANENT PACEMAKER WITH ATRIAL LEAD Left 11/04/2022    Performed by Kathreen Cornfield, MD at [facility] EP LAB    Injection Venography Extremity Left 11/04/2022    Performed by Kathreen Cornfield, MD at [facility] EP LAB    BONE MARROW BIOPSY      CARDIAC DEFIBRILLATOR PLACEMENT      [employer]    CARDIAC SURGERY      CABG 3 V    CAROTID ENDARDECTOMY Right     COLONOSCOPY      HX CORONARY STENT PLACEMENT      HX HEART CATHETERIZATION      HX HYSTERECTOMY      TUNNELED VENOUS PORT PLACEMENT Right     Chest       SH:  Social History     Socioeconomic History    Marital status: Widowed   Tobacco Use    Smoking status: Former     Current packs/day: 0.00     Average packs/day: 1 pack/day for 20.0 years (20.0 ttl pk-yrs)     Types: Cigarettes     Start date: 71     Quit date: 2010     Years since quitting: 14.9     Passive exposure: Never    Smokeless tobacco: Never   Vaping Use    Vaping status: Never Used   Substance and Sexual Activity    Alcohol use: Not Currently    Drug use: Not Currently       FH:  Family History   Problem Relation Name Age of Onset    Cancer Mother      Diabetes Mother      Hypertension Mother      Cancer-Breast Sister Cancer-Ovarian Sister      Cancer Sister      Diabetes Sister      Heart Disease Sister      Hypertension Sister      Heart Disease Brother      Hypertension Brother      Stroke Brother         Review of Systems:  Constitutional: Denies fevers, chills, weight loss  Eyes: Denies change in vision  Ears, nose, mouth: Denies oral bleeding, ulcer,   Cardiovascular: Denies chest pain, palpitations or lower extremity edema   Respiratory: Denies dyspnea, cough   Gastrointestinal: Denies abdominal pain, blood in stool or changes in bowel habits  Musculoskeltal: Denies joint inflammation, new deformity  Integumentary: Denies rashes or abrasions  Neurologic: Denies cognitive change, focal weakness  Hematologic: Denies easy bleeding or bruising  Please see HPI for additional pertinent documentation    Physical Exam:  Vitals:    03/24/23 1330   BP: 117/52   BP Source: Arm, Right Upper   Pulse: 100   Temp: 36.7 ?C (  98.1 ?F)   SpO2: 99%   O2 Device: None (Room air)   Weight: 73 kg (161 lb)   Height: 157.5 cm (5' 2)     Constitutional- Vitals above, no acute distress.   Head - Normocephalic, atraumatic.   Eyes -  PERRLA, EOMI, anicteric conjunctivae, no scleral injection or conjunctival pallor    Ears, nose, mouth, throat- Moist mucus membranes, No oral ulcer or bleeding.   Neck - No swelling or tracheal deviation, no lymphadenopathy appreciated   Respiratory - Symmetric chest rise, no increased work of breathing.  Cardiovascular - Peripheral pulses intact, no pedal edema.  Gastrointestinal- Soft, Non-TTP. No hepatosplenomegaly. BS+ in all quadrants  Skin - No exposed rash, lesion or jaundice   Neurologic - Answering questions and following commands appropriately, No CN deficit, normal fluid speech  Psychiatric - Appropriate mood/affect, Judgement intact, thought content appropriate, AAOx3  Labs/Imaging:  Pertient labs/imaging were reviewed on initiation of progress note.

## 2023-03-25 LAB — POC GLUCOSE
~~LOC~~ BKR POC GLUCOSE: 229 mg/dL — ABNORMAL HIGH (ref 70–100)
~~LOC~~ BKR POC GLUCOSE: 174 mg/dL — ABNORMAL HIGH (ref 70–100)
~~LOC~~ BKR POC GLUCOSE: 177 mg/dL — ABNORMAL HIGH (ref 70–100)
~~LOC~~ BKR POC GLUCOSE: 321 mg/dL — ABNORMAL HIGH (ref 70–100)
~~LOC~~ BKR POC GLUCOSE: 217 mg/dL — ABNORMAL HIGH (ref 70–100)

## 2023-03-25 LAB — PHOSPHORUS: ~~LOC~~ BKR PHOSPHORUS: 3.2 mg/dL (ref 2.0–4.5)

## 2023-03-25 LAB — MAGNESIUM: ~~LOC~~ BKR MAGNESIUM: 2 mg/dL (ref 1.6–2.6)

## 2023-03-25 MED ORDER — CARVEDILOL 3.125 MG PO TAB
3.125 mg | Freq: Two times a day (BID) | ORAL | 0 refills | Status: DC
Start: 2023-03-25 — End: 2023-04-01
  Administered 2023-03-25 – 2023-03-31 (×12): 3.125 mg via ORAL

## 2023-03-25 NOTE — Progress Notes
Internal Medicine Inpatient Progress Note    Name: Raven [name]   MRN: [id number]   Admission Date: 03/24/2023  Today's Date:  03/25/2023  LOS: 1 day                     Assessment/Plan:    [name] is a 77 y.o. 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     Acute on chronic symptomatic anemia (MM, CKD), now worsening with presumed GIB  Melena  C/f GIB  Chronic anticoagulation ?triple therapy  Hx diverticulosis and sigmoid colon mass resection 07/2022  -transfused 1U PRBCs due to symptoms and h/o CAD 11/28  -she is on [facility] outpatient with H/O  Plan;  -Hold triple therapy  -Consider H pylori testing once bleeding resolved vs outpt if no plan EGD where bx could be obtained.  -Daily CBC  -GI consulted, no plans for EGD yet  -IV PPI BID      CAD s/p CABG and PCI  HFrEF 35%, ICM on milrinone infusion, CRT-D  Afib  Carotid artery Disease with CEA 2016  -pt reports being on triple therapy (asa, plavix, eliquis) unsure why this is the case according to previous notes  Plan:  -Consult cardiology: rec'd Eliquis monotherapy  -Pt with concerns about affordability Eliquis. Will continue to address.  -Consideration outpt Watchman.   -continue BB, amiodarone  -continue milrinone pump  -hold eliquis/asa/plavix for now  -continue statin     Multiple Myeloma  -follows [facility]  -continue ppx acyclovir (?not on doxy per pharm)  -continue dex (this is weekly, apparently)  -on outpatient darzalex     CKD4, stable  -monitor     DM2,  -hold PTA insulin, dm meds for now  -LDCF      Prophylaxis/Checklist:  Diet: DIET CARDIAC(LOW FAT/LOW SODIUM)   Lines/Access: Peripheral IV  Drains: None  DVT Ppx: PTA Therapeutic AC  Code: Full Code  Consults: GI, HF  PT/OT: Consulted.  Dispo: Inpt status; Anticipated dispo to home. EDD pending stability of Hgb and renal function, decision on EGD.       [name], MD  Hospitalist, Internal Medicine  Med [facility] V- [phone]     To contact, Voalte (preferred) the First Call for the Med [facility] team listed or page the team pager above.      This note was prepared in part using [employer], please excuse any typographical/dictation errors that may have resulted from its use.  ________________________________________________________________________    Subjective  No acute events overnight. Overall reports feeling well.  Has been having fatigue and decreased exercise tolerance over last numerous days to weeks, worsening.  She is unsure if it is changed as has not been OOB yet this AM.  Last BM overnight, melenotic.  Denies abdominal pain, nausea, vomiting.  Does report increased bloating and burping over last weeks.     ROS:   A 10-point review of systems was completed.      Scheduled Meds:acyclovir (ZOVIRAX) tablet 400 mg, 400 mg, Oral, BID  amiodarone (CORDARONE) tablet 200 mg, 200 mg, Oral, QDAY  budesonide-formoterol HFA (SYMBICORT) 160-4.5 mcg/actuation inhalation  2 puff, 2 puff, Inhalation, BID  carvediloL (COREG) tablet 3.125 mg, 3.125 mg, Oral, BID w/meals  insulin aspart (U-100) (NOVOLOG FLEXPEN U-100 INSULIN) injection PEN 0-6 Units, 0-6 Units, Subcutaneous, ACHS (22)  pantoprazole (PROTONIX) injection 40 mg, 40 mg, Intravenous, BID(11-21)  rosuvastatin (CRESTOR) tablet 20 mg, 20 mg, Oral, QDAY  sertraline (ZOLOFT) tablet 50 mg, 50 mg, Oral, QDAY    Continuous Infusions:   milrinone (PRIMACOR) 20 mg/D5W 100 mL infusion 0.3 mcg/kg/min (03/25/23 0849)     PRN and Respiratory Meds:acetaminophen Q4H PRN, albuterol sulfate Q4H PRN, dextrose 50% (D50) IV PRN, melatonin QHS PRN, ondansetron Q6H PRN **OR** ondansetron (ZOFRAN) IV Q6H PRN, polyethylene glycol 3350 QDAY PRN, sennosides-docusate sodium QDAY PRN       Objective:     Physical Exam  General: in NAD. VS reviewed.  Pulmonary: No readily audible wheezes, rhonchi. No accessory muscle use. No increased WOB.   CV: RRR  Abdo: soft, non-distended.   Skin: no visible rashes or significant lesions  Neuro: Awake and alert. No focal neuro deficits.   Psych: Appropriate mood and affect    Lab Review  Pertinent labs reviewed. Please refer to results section for further information.    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

## 2023-03-25 NOTE — Care Plan
 Problem: Discharge Planning  Goal: Participation in plan of care  Outcome: Goal Ongoing  Goal: Knowledge regarding plan of care  Outcome: Goal Ongoing  Goal: Prepared for discharge  Outcome: Goal Ongoing     Problem: High Fall Risk  Goal: High Fall Risk  Outcome: Goal Ongoing

## 2023-03-25 NOTE — Consults
Note to fulfill consult order - please see separate consult note

## 2023-03-25 NOTE — Consults
Cardiology Consultation    [facility]  Admission Date:  03/24/2023  Date of Consultation:   03/25/2023  LOS: 1 day  Requesting Physician: Arlington Calix, DO  Consulting Physician: Dr. 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   -- No indication for triple therapy, but in setting of polyvascular dz and PAF, would benefit from single antiplatelet therapy with Eliquis. May also benefit from a LAAO device as well.  In the interim, would favor Plavix over ASA when she has recovered from GIB and safe from GI to restart.  Would monitor hgb levels closely after restarting.     Anemia   -- Hx diverticulosis and sigmoid colon mass resection 07/2022   -- reported 2 day history of melena.   -- Hgb 7.8 (from 6.8 on admission)-s/p 1 UPRBC  -- GI consulted with plans for EGD this admission    PAF  - PTA on Eliquis. Currently on hold in the setting of GIB.    - Rates 90's -100's on Coreg and amiodarone.    -- Consider Watchman. Discussed briefly with patient and she is interested.    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 Sarita Bottom  ______________________________________________________    Chief Complaint:  GI bleed recommendations on anticoagulation and milrinone gtt.     History of Present Illness: Raven [name] is a 77 y.o. 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     She is a rather poor historian and not overly familiar with her medications.  Last admission was in July and noted to have significant anemia with a hgb 6.6 requiring blood transfusions. She saw [name] in clinic 11/22 at which time noted that patient was off Plavix and on ASA with Eliquis in anticipation for outpatient EGD/EUS with polypectomy for gastric and duodenal polyps. Gwyneth does not recall this and thinks she may have been on both (ASA and Plavix), but is not sure. At that visit, when was also noted to be on milrinone pump with some discussion of stopping. Prior to admission, she reported black tarry stools. Initial hgb 6.8 for low hemoglobin. Hgb was 6,8-->7.3-->7.8 following 1 UPRBC. She believes last night she had another tarry stool.   Past Medical History:   Diagnosis Date    Anxiety     Arthritis     Asthma     Asymptomatic stenosis of right carotid artery     Back pain     Cancer of uterus (HCC)     COPD (chronic obstructive pulmonary disease) (HCC)     Coronary artery disease     Diabetes mellitus (HCC)  Dyslipidemia     Flank mass     Fluid retention     Heart disease     Hypertension     Other malignant neoplasm without specification of site     Sleep apnea     Compliant w CPAP    Vision decreased      Surgical History:   Procedure Laterality Date    ROBOT ASSISTED LOW ANTERIOR RESECTION N/A 08/10/2022    Performed by Benetta Spar, MD at [facility] OR    ESOPHAGOGASTRODUODENOSCOPY WITH BIOPSY - FLEXIBLE N/A 09/17/2022    Performed by Tempie Hoist, DO at [facility] ENDO    CATHETERIZATION RIGHT HEART N/A 09/18/2022    Performed by Cath, Physician at [facility] CATH LAB    REMOVAL AND REPLACEMENT IMPLANTABLE DEFIBRILLATOR GENERATOR - SINGLE LEAD SYSTEM Left 11/04/2022    Performed by Kathreen Cornfield, MD at [facility] EP LAB    INSERTION/ REPLACEMENT PERMANENT PACEMAKER WITH ATRIAL LEAD Left 11/04/2022    Performed by Kathreen Cornfield, MD at [facility] EP LAB Injection Venography Extremity Left 11/04/2022    Performed by Kathreen Cornfield, MD at [facility] EP LAB    BONE MARROW BIOPSY      CARDIAC DEFIBRILLATOR PLACEMENT      [employer]    CARDIAC SURGERY      CABG 3 V    CAROTID ENDARDECTOMY Right     COLONOSCOPY      HX CORONARY STENT PLACEMENT      HX HEART CATHETERIZATION      HX HYSTERECTOMY      TUNNELED VENOUS PORT PLACEMENT Right     Chest     Family History   Problem Relation Name Age of Onset    Cancer Mother      Diabetes Mother      Hypertension Mother      Cancer-Breast Sister      Cancer-Ovarian Sister      Cancer Sister      Diabetes Sister      Heart Disease Sister      Hypertension Sister      Heart Disease Brother      Hypertension Brother      Stroke Brother       Social History     Socioeconomic History    Marital status: Widowed   Tobacco Use    Smoking status: Former     Current packs/day: 0.00     Average packs/day: 1 pack/day for 20.0 years (20.0 ttl pk-yrs)     Types: Cigarettes     Start date: 1     Quit date: 2010     Years since quitting: 14.9     Passive exposure: Never    Smokeless tobacco: Never   Vaping Use    Vaping status: Never Used   Substance and Sexual Activity    Alcohol use: Not Currently    Drug use: Not Currently        Allergies:  Bortezomib, Allopurinol, and Sulfa (sulfonamide antibiotics)    Medications:  Medications Prior to Admission   Medication Sig    acetaminophen (TYLENOL) 325 mg tablet Take three tablets by mouth every 8 hours as needed for Pain. Indications: pain    acyclovir (ZOVIRAX) 400 mg tablet Take one tablet by mouth twice daily.    albuterol sulfate (PROAIR HFA) 90 mcg/actuation HFA aerosol inhaler Inhale two puffs by mouth into the lungs every 6 hours as needed for Wheezing or Shortness of Breath.  amiodarone (CORDARONE) 200 mg tablet Take one tablet by mouth daily. Indications: prevention of recurrent atrial fibrillation    apixaban (ELIQUIS) 5 mg tablet Take one tablet by mouth twice daily. Indications: treatment to prevent blood clots in chronic atrial fibrillation    aspirin 81 mg chewable tablet Chew one tablet by mouth daily. Indications: stroke prevention    budesonide-formoterol HFA (SYMBICORT) 160-4.5 mcg/actuation aerosol inhaler Inhale two puffs by mouth into the lungs twice daily. Indications: bronchospasm prevention with COPD    carvediloL (COREG) 6.25 mg tablet Take one tablet by mouth twice daily with meals.    cholecalciferol (VITAMIN D) 1,000 units tablet Take one tablet by mouth daily.    clopiDOGreL (PLAVIX) 75 mg tablet Take one tablet by mouth daily.    cyanocobalamin (VITAMIN B-12) 1,000 mcg tablet Take 5,000 Units by mouth daily.    dexAMETHasone (DECADRON) 4 mg tablet Take two tablets by mouth every 7 days. Patient is taking 8mg  on Thursdays prior to chemo    dicyclomine (BENTYL) 10 mg capsule Take one capsule by mouth daily.    empagliflozin ([name]) 25 mg tablet Take 10 mg by mouth daily.    EMU OIL (BULK) MISC Use  as directed. Apply topically as needed    esomeprazole DR (NEXIUM) 20 mg capsule Take one capsule by mouth daily.    febuxostat (ULORIC) 40 mg tablet Take one-half tablet by mouth daily. Indications: taken on chemo days    ferrous sulfate (FEOSOL) 325 mg (65 mg iron) tablet Take one tablet by mouth daily. Take on an empty stomach at least 1 hour before or 2 hours after food.    insulin detemir U-100 (LEVEMIR FLEXTOUCH) 100 unit/mL (3 mL) injection pen Inject fifteen Units under the skin at bedtime daily.    metOLazone (ZAROXOLYN) 5 mg tablet Take one tablet by mouth daily as needed. (Patient not taking: Reported on 03/19/2023)    milrinone (PRIMACOR) 200 mcg/mL infusion Administer 22.68 mcg/min through vein Continuous. Indications: sudden and serious symptoms of heart failure called acute decompensated heart failure    nitroglycerin (NITROSTAT) 0.4 mg tablet Place one tablet under tongue every 5 minutes as needed for Chest Pain.    ONETOUCH VERIO TEST STRIPS test strip USE STRIP TO CHECK GLUCOSE TWICE DAILY FOR DIABETES MELLITUS    pantoprazole DR (PROTONIX) 40 mg tablet Take one tablet by mouth daily. Indications: gastritis    potassium chloride SR (K-DUR) 20 mEq tablet Take three tablets by mouth daily AND two tablets every evening. Indications: prevention of low potassium in the blood    prochlorperazine maleate (COMPAZINE) 10 mg tablet Take one tablet by mouth every 6 hours as needed. (Patient not taking: Reported on 03/19/2023)    rosuvastatin (CRESTOR) 20 mg tablet Take one tablet by mouth daily.    sertraline (ZOLOFT) 50 mg tablet Take one tablet by mouth daily.    torsemide (DEMADEX) 20 mg tablet Take 20 mg daily and an extra 20 mg on MWF.  Indications: accumulation of fluid resulting from chronic heart failure     Review of Systems   Review of Systems   Gastrointestinal:  Positive for melena.   Psychiatric/Behavioral:  Positive for memory loss.    All other systems reviewed and are negative.    Physical Exam:  Vital Signs: Last Filed In 24 Hours Vital Signs: 24 Hour Range   -  BP: 98/53 (11/28 0841)  Temp: 36.9 ?C (98.5 ?F) (11/28 4540)  Pulse: 88 (11/28 0841)  Respirations: 16 PER  MINUTE (11/28 0841)  SpO2: 97 % (11/28 0841)  O2 Device: None (Room air) (11/28 0841)  Height: 157.5 cm (5' 2) (11/27 1330) BP: (98-134)/(51-70)   Temp:  [36.4 ?C (97.5 ?F)-37 ?C (98.6 ?F)]   Pulse:  [86-105]   Respirations:  [16 PER MINUTE-18 PER MINUTE]   SpO2:  [94 %-100 %]   O2 Device: None (Room air)            Physical Exam   Nursing note and vitals reviewed.  HENT:   Head: Normocephalic and atraumatic.   Cardiovascular: Normal rate, regular rhythm, S1 normal, S2 normal, normal heart sounds and normal pulses.   Pulses:       Carotid pulses are 2+ on the right side and 2+ on the left side.  Pulmonary/Chest: Effort normal and breath sounds normal.   Abdominal: Soft. Normal appearance.   Musculoskeletal:         General: Normal range of motion.      Cervical back: Neck supple.   Neurological: She is alert and oriented to person, place, and time.   Skin: Skin is warm and dry. Capillary refill takes less than 2 seconds.   Psychiatric: Mood, judgment and thought content normal.     Lab/Radiology/Other Diagnostic Tests:  24-hour labs:    Results for orders placed or performed during the hospital encounter of 03/24/23 (from the past 24 hours)   CBC AND DIFF    Collection Time: 03/24/23  2:13 PM   Result Value Ref Range    White Blood Cells 14.60 (H) 4.50 - 11.00 10*3/uL    Red Blood Cells 2.37 (L) 4.00 - 5.00 10*6/uL    Hemoglobin 7.3 (L) 12.0 - 15.0 g/dL    Hematocrit 13.0 (L) 36.0 - 45.0 %    MCV 97.2 80.0 - 100.0 fL    MCH 30.9 26.0 - 34.0 pg    MCHC 31.7 (L) 32.0 - 36.0 g/dL    RDW 86.5 (H) 78.4 - 15.0 %    Platelet Count 315 150 - 400 10*3/uL    MPV 8.4 7.0 - 11.0 fL    Neutrophils 68 41 - 77 %    Lymphocytes 14 (L) 24 - 44 %    Monocytes 9 4 - 12 %    Eosinophils 8 (H) 0 - 5 %    Basophils 1 0 - 2 %    Absolute Neutrophil Count 10.00 (H) 1.80 - 7.00 10*3/uL    Absolute Lymph Count 2.00 1.00 - 4.80 10*3/uL    Absolute Monocyte Count 1.40 (H) 0.00 - 0.80 10*3/uL    Absolute Eosinophil Count 1.20 (H) 0.00 - 0.45 10*3/uL    Absolute Basophil Count 0.10 0.00 - 0.20 10*3/uL    MDW (Monocyte Distribution Width) 20.5 <=20.6   TYPE & CROSSMATCH    Collection Time: 03/24/23  2:13 PM   Result Value Ref Range    ABO/RH(D) B POS     Antibody Screen POS     Crossmatch Expires 03/27/2023,2359     Units Ordered 1     Antibody Identification       Daratumumab interference present. No alloantibodies detected.    Record Check FOUND    PTT (APTT)    Collection Time: 03/24/23  2:13 PM   Result Value Ref Range    APTT 33.2 24.0 - 36.5 Seconds   PREPARE RBC - SUNQUEST    Collection Time: 03/24/23  2:13 PM   Result Value Ref Range    Units  Ordered 1     Crossmatch Expires 03/27/2023,2359     Record Check FOUND     ABO/RH(D) B POS     Antibody Screen POS     Antibody Indentification       Daratumumab interference present. No alloantibodies detected.    Unit Number [id number]     Blood Component Type RBC,ADSOL,LEUKO REDUCED,IRRADIATED     Unit Division 00     Status OF Unit TRANSFUSED     ISSUE DATE TIME [id number]     PRODUCT CODE Z3086V78     BLOOD TYPE O POS     CODING STATUS 5100     BLOOD EXPIRATION DATE [id number]     Transfusion Status OK TO TRANSFUSE     Crossmatch Result INCOMPATIBLE    URINALYSIS DIPSTICK REFLEX TO CULTURE    Collection Time: 03/24/23  3:43 PM    Specimen: Midstream; Urine   Result Value Ref Range    Color,UA Straw     Turbidity,UA Clear Clear    Specific Gravity-Urine 1.008 1.005 - 1.030    pH,UA 6.0 5.0 - 8.0    Protein,UA Negative Negative    Glucose,UA 3+ (A) Negative    Ketones,UA Negative Negative    Bilirubin,UA Negative Negative    Blood,UA Negative Negative    Urobilinogen,UA Normal Normal    Nitrite,UA Negative Negative    Leukocytes,UA Negative Negative   URINALYSIS MICROSCOPIC REFLEX TO CULTURE    Collection Time: 03/24/23  3:43 PM    Specimen: Midstream; Urine   Result Value Ref Range    WBCs,UA 0 - 2 None, 0 - 2  /HPF    RBCs,UA 0 - 2 None, 0 - 2  /HPF    Squamous Epithelial Cells 0 - 2 None, 0 - 2 , 2 - 5 /HPF   POC GLUCOSE    Collection Time: 03/24/23  6:36 PM   Result Value Ref Range    Glucose, POC 229 (H) 70 - 100 mg/dL   POC GLUCOSE    Collection Time: 03/24/23  9:43 PM   Result Value Ref Range    Glucose, POC 174 (H) 70 - 100 mg/dL   MAGNESIUM    Collection Time: 03/24/23 11:31 PM   Result Value Ref Range    Magnesium 2.0 1.6 - 2.6 mg/dL   PHOSPHORUS    Collection Time: 03/24/23 11:31 PM   Result Value Ref Range    Phosphorus 3.2 2.0 - 4.5 mg/dL   CBC AND DIFF    Collection Time: 03/25/23  4:27 AM   Result Value Ref Range    White Blood Cells 14.60 (H) 4.50 - 11.00 10*3/uL    Red Blood Cells 2.62 (L) 4.00 - 5.00 10*6/uL    Hemoglobin 7.8 (L) 12.0 - 15.0 g/dL    Hematocrit 24.4 (L) 36.0 - 45.0 %    MCV 92.6 80.0 - 100.0 fL    MCH 29.9 26.0 - 34.0 pg    MCHC 32.3 32.0 - 36.0 g/dL RDW 01.0 (H) 27.2 - 53.6 %    Platelet Count 274 150 - 400 10*3/uL    MPV 8.0 7.0 - 11.0 fL    Neutrophils 65 41 - 77 %    Lymphocytes 15 (L) 24 - 44 %    Monocytes 11 4 - 12 %    Eosinophils 8 (H) 0 - 5 %    Basophils 1 0 - 2 %    Absolute Neutrophil Count 9.50 (H) 1.80 - 7.00 10*3/uL  Absolute Lymph Count 2.10 1.00 - 4.80 10*3/uL    Absolute Monocyte Count 1.70 (H) 0.00 - 0.80 10*3/uL    Absolute Eosinophil Count 1.20 (H) 0.00 - 0.45 10*3/uL    Absolute Basophil Count 0.10 0.00 - 0.20 10*3/uL   COMPREHENSIVE METABOLIC PANEL    Collection Time: 03/25/23  4:27 AM   Result Value Ref Range    Sodium 138 137 - 147 mmol/L    Potassium 4.7 3.5 - 5.1 mmol/L    Chloride 105 98 - 110 mmol/L    Glucose 152 (H) 70 - 100 mg/dL    Blood Urea Nitrogen 44 (H) 7 - 25 mg/dL    Creatinine 0.86 (H) 0.40 - 1.00 mg/dL    Calcium 8.5 8.5 - 57.8 mg/dL    Total Protein 5.2 (L) 6.0 - 8.0 g/dL    Total Bilirubin 1.0 0.3 - 1.2 mg/dL    Albumin 3.3 (L) 3.5 - 5.0 g/dL    Alk Phosphatase 66 25 - 110 U/L    AST 14 7 - 40 U/L    ALT 10 7 - 56 U/L    CO2 24 21 - 30 mmol/L    Anion Gap 10 3 - 12    Glomerular Filtration Rate (GFR) 16 (L) >60 mL/min   POC GLUCOSE    Collection Time: 03/25/23  8:07 AM   Result Value Ref Range    Glucose, POC 177 (H) 70 - 100 mg/dL   POC GLUCOSE    Collection Time: 03/25/23 11:29 AM   Result Value Ref Range    Glucose, POC 321 (H) 70 - 100 mg/dL   , Hematology:    Lab Results   Component Value Date    HGB 7.8 03/25/2023    HGB 6.8 03/23/2023    HCT 24.3 03/25/2023    HCT 22 03/23/2023    PLTCT 274 03/25/2023    PLTCT 315 03/23/2023    WBC 14.60 03/25/2023    WBC 12.2 03/23/2023    NEUT 65 03/25/2023    NEUT 49 02/10/2023    ANC 9.50 03/25/2023    ANC 12.22 11/01/2022    ANC 2 10/05/2022    LYMPH 10 11/01/2022    ALC 2.10 03/25/2023    ALC 1.98 02/10/2023    MONA 11 03/25/2023    MONA 15 02/10/2023    AMC 1.70 03/25/2023    AMC 0.98 02/10/2023    EOSA 8 03/25/2023    EOSA 8 09/23/2022    ABC 0.10 03/25/2023    ABC 0.04 02/10/2023    MCV 92.6 03/25/2023    MCV 100 03/23/2023    MCH 29.9 03/25/2023    MCH 31 03/23/2023    MCHC 32.3 03/25/2023    MCHC 31 03/23/2023    MPV 8.0 03/25/2023    MPV 11.3 03/23/2023    RDW 30.8 03/25/2023    RDW 22.9 03/23/2023       Hemoccult: (S) Positive (03/24/23 1401)      Winfred Leeds, APRN-NP (Pager [phone])

## 2023-03-25 NOTE — Progress Notes
RT Adult Assessment Note    NAME:Raven [name]             MRN: [id number]             DOB:1946-02-14          AGE: 77 y.o.  ADMISSION DATE: 03/24/2023             DAYS ADMITTED: LOS: 0 days    Additional Comments:  Impressions of the patient: Patient resting on room air, no distress  Intervention(s)/outcome(s): Patient has not been taking Symbicort at home but wants to take here. Ordered per med list home regimen  Patient education that was completed: N/A  Recommendations to the care team: N/A    Vital Signs:  Pulse: 100  RR: 18 PER MINUTE  SpO2: 98 %  O2 Device: None (Room air)  Liter Flow:    O2%:      Breath Sounds:   Right Apex Breath Sounds: Clear (Implies normal)  Right Base Breath Sounds: Decreased  Left Apex Breath Sounds: Clear (Implies normal)  Left Base Breath Sounds: Decreased  Respiratory Effort:   Respiratory Effort/Pattern: Unlabored  Comments:

## 2023-03-26 ENCOUNTER — Encounter: Admit: 2023-03-26 | Discharge: 2023-03-26 | Payer: MEDICARE

## 2023-03-26 LAB — POC GLUCOSE
~~LOC~~ BKR POC GLUCOSE: 230 mg/dL — ABNORMAL HIGH (ref 70–100)
~~LOC~~ BKR POC GLUCOSE: 169 mg/dL — ABNORMAL HIGH (ref 70–100)
~~LOC~~ BKR POC GLUCOSE: 240 mg/dL — ABNORMAL HIGH (ref 70–100)
~~LOC~~ BKR POC GLUCOSE: 253 mg/dL — ABNORMAL HIGH (ref 70–100)

## 2023-03-26 NOTE — Progress Notes
PHYSICAL THERAPY  ASSESSMENT      Name: Raven [name]   MRN: [id number]     DOB: 1945/07/09      Age: 77 y.o.  Admission Date: 03/24/2023     LOS: 2 days     Date of Service: 03/26/2023      Mobility  Patient Turn/Position: Chair;Self  Progressive Mobility Level: Walk in hallway  Distance Walked (feet): 120 ft  Level of Assistance: Assist X1  Assistive Device: Walker  Activity Limited By: Fatigue    Subjective  Significant Hospital Events: 77 y.o. 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  Mental / Cognitive: Alert;Oriented;Cooperative    Home Living Situation  Lives With: Family  Type of Home: House  Entry Stairs: No stairs (through garage- no steps)  In-Home Stairs: Stair glide (lives in basement)  Bathroom Setup: Tub/shower unit  Patient Owned Equipment: Environmental consultant: Occupational hygienist (power scooter)    Prior Level of Function  Level Of Independence: Independent with ADL and community mobility with device  Comments: dependent upon fatigue level with HF  History of Falls in Past 3 Months: Yes  Comments: 1 trip a few weeks ago but not really mechanical in nature, no other falls  Comments: No concerns with mobility or strength, limited primarily in endurance due to heart failure and dizziness.    ROM  ROM Comments: all WFL    Strength  Strength Position Assessed: Seated  Overall Strength: WFL    Bed Mobility/Transfer  Bed Mobility: Supine to Sit: Independent  Transfer Type: Sit to/from Stand  Transfer: Assistance Level: To/From;Bed;Toilet;Standby Assist  Transfer: Assistive Device: Nurse, adult  Transfers: Type Of Assistance: For Safety Considerations  End Of Activity Status: Up in Chair;Instructed Patient to Request Assist with Mobility;Instructed Patient to Use Call Light    Gait  Gait Distance: 120 feet  Gait: Assistance Level: Standby Assist  Gait: Assistive Device: Nurse, adult  Gait: Descriptors: Pace: Slow;Forward trunk flexion  Activity Limited By: Complaint of Fatigue  Comments: Toileting SBA in restroom    Education  Persons Educated: Patient  Patient Barriers To Learning: None Noted  Patient Response: Verbalized Understanding;More Instruction Required  Topics: Plan/Goals of PT Interventions;Use of Assistive Device/Orthosis;Mobility Progression;Importance of Increasing Activity;Ambulate With Nursing;Equipment Recommendations;Recommend Continued Therapy  Comments: Educated on position changes and energy conservation    Assessment/Progress  Impaired Mobility Due To: Medical Status Limitation;Deconditioning;Decreased Activity Tolerance  Assessment/Progress: Expect Good Progress;Should Improve w/ Continued PT    AM-PAC 6 Clicks Basic Mobility Inpatient  Turning from your back to your side while in a flat bed without using bed rails: None  Moving from lying on your back to sitting on the side of a flat bed without using bedrails : A Little  Moving to and from a bed to a chair (including a wheelchair): A Little  Standing up from a chair using your arms (e.g. wheelchair, or bedside chair): A Little  To walk in hospital room: A Little  Climbing 3-5 steps with a railing: A Little  Basic Mobility Inpatient Raw Score: 19  Standardized (T-scale) Score: 42.48    Goals  Goal Formulation: With Patient  Time For Goal Achievement: 4  days  Patient Will Go Supine To/From Sit: Independently  Patient Will Transfer Bed/Chair: Independently  Patient Will Transfer Sit to Stand: Independently  Patient Will Ambulate: 101-150 Feet, w/ Dan Humphreys, Independently    Plan  Treatment Interventions: Mobility training;Strengthening;Balance activities;Endurance Psychologist, prison and probation services to assist with ongoing mobilization, ADLs and/or exercise per instructions of licensed therapy staff  Plan Frequency: 3-5 Days per Week  PT Plan for Next Visit: progress gait endurance with walker, independence with transfers    PT Discharge Recommendations  Recommendation: Home with intermittent supervision/assistance  Recommendation for Therapy Post Discharge: Home health  Patient Currently Requires Physical Assist With: All home functioning ADLs  Patient Currently Requires Equipment: Owns what is needed    Therapist  Hedy Jacob, PT, DPT [phone]  Date  03/26/2023

## 2023-03-26 NOTE — Progress Notes
Internal Medicine Inpatient Progress Note    Name: Raven [name]   MRN: [id number]   Admission Date: 03/24/2023  Today's Date:  03/26/2023  LOS: 2 days                     Assessment/Plan:    Raven [name] is a 77 y.o. 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     Acute on chronic symptomatic anemia (MM, CKD), now worsening with presumed GIB  Melena  C/f GIB  Chronic anticoagulation ?triple therapy  Hx diverticulosis and sigmoid colon mass resection 07/2022  -transfused 1U PRBCs due to symptoms and h/o CAD 11/28  -she is on Aranesp outpatient with H/O  Plan;  -Hold triple therapy.   -Consider H pylori testing once bleeding resolved vs outpt if no plan EGD where bx could be obtained.  -Daily CBC  -GI consulted, no plans for EGD yet  -IV PPI BID      CAD s/p CABG and PCI  HFrEF 35%, ICM on milrinone infusion, CRT-D  Afib  Carotid artery Disease with CEA 2016  -pt reports being on triple therapy (asa, plavix, eliquis) unsure why this is the case according to previous notes  Plan:  -Consult cardiology: rec'd Eliquis monotherapy  -Pt with concerns about affordability Eliquis. Will need to address w [facility] and pharmacy to determine if able to qualify for assistance.  If unable would need to discuss with cardiology  -Consideration outpt [facility]. May not be feasible if unable to be on [facility] due to cost.   -continue BB, amiodarone  -continue milrinone pump  -hold eliquis/asa/plavix for now  -continue statin     Multiple Myeloma  -follows at [facility] H/O  -continue ppx acyclovir (?not on doxy per pharm)  -continue dex (this is weekly, apparently)  -on outpatient darzalex     CKD4, stable  -monitor     DM2,  -hold PTA insulin, dm meds for now  -LDCF      Prophylaxis/Checklist:  Diet: DIET CARDIAC(LOW FAT/LOW SODIUM)   Lines/Access: Peripheral IV  Drains: None  DVT Ppx: PTA Therapeutic AC  Code: Full Code  Consults: GI, HF  PT/OT: Consulted. Home intermittent  Dispo: Inpt status; Anticipated dispo to home. EDD pending stability of Hgb and renal function, decision on EGD.       Raven Loveless, MD  Hospitalist, Internal Medicine  Med Private V- [phone]     To contact, Voalte (preferred) the First Call for the Med Private team listed or page the team pager above.      This note was prepared in part using [employer], please excuse any typographical/dictation errors that may have resulted from its use.  ________________________________________________________________________    Subjective  No acute events overnight.  BM overnight black but no longer tarry. No abdo pain  No nausea, vomiting  Discussed hgb slightly declined.     ROS:   A 10-point review of systems was completed.      Scheduled Meds:acyclovir (ZOVIRAX) tablet 400 mg, 400 mg, Oral, BID  amiodarone (CORDARONE) tablet 200 mg, 200 mg, Oral, QDAY  budesonide-formoterol HFA (SYMBICORT) 160-4.5 mcg/actuation inhalation  2 puff, 2 puff, Inhalation, BID  carvediloL (COREG) tablet 3.125 mg, 3.125 mg, Oral, BID w/meals  insulin aspart (U-100) (NOVOLOG FLEXPEN U-100 INSULIN) injection PEN 0-6 Units, 0-6 Units, Subcutaneous, ACHS (22)  pantoprazole (PROTONIX) injection 40 mg, 40 mg, Intravenous, BID(11-21)  rosuvastatin (CRESTOR) tablet 20 mg, 20 mg, Oral, QDAY  sertraline (ZOLOFT) tablet 50 mg, 50 mg, Oral, QDAY    Continuous Infusions:   milrinone (PRIMACOR) 20 mg/D5W 100 mL infusion 0.3 mcg/kg/min (03/26/23 0130)     PRN and Respiratory Meds:acetaminophen Q4H PRN, albuterol sulfate Q4H PRN, dextrose 50% (D50) IV PRN, melatonin QHS PRN, ondansetron Q6H PRN **OR** ondansetron (ZOFRAN) IV Q6H PRN, polyethylene glycol 3350 QDAY PRN, sennosides-docusate sodium QDAY PRN       Objective:     Physical Exam  General: in NAD. VS reviewed.  Pulmonary: No readily audible wheezes, rhonchi. No accessory muscle use. No increased WOB.   CV: RRR  Abdo: soft, non-distended.   Skin: no visible rashes or significant lesions  Neuro: Awake and alert. No focal neuro deficits.   Psych: Appropriate mood and affect    Lab Review  Pertinent labs reviewed. Please refer to results section for further information.    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

## 2023-03-26 NOTE — Progress Notes
OCCUPATIONAL THERAPY DISCHARGE  NOTE   Name: Raven [name]   MRN: [id number]     DOB: [birth year]      Age: 77 y.o.  Admission Date: 03/24/2023     LOS: 2 days     Date of Service: 03/26/2023      Based on discussion with PT, patient's current level of function suggests that patient will progress with one discipline. OT will sign off at this time. Please re-consult if a change in functional status occurs.     Therapist: Burnadette Pop, OTD, OTR/L  Date: 03/26/2023

## 2023-03-27 LAB — POC GLUCOSE
~~LOC~~ BKR POC GLUCOSE: 233 mg/dL — ABNORMAL HIGH (ref 70–100)
~~LOC~~ BKR POC GLUCOSE: 174 mg/dL — ABNORMAL HIGH (ref 70–100)
~~LOC~~ BKR POC GLUCOSE: 176 mg/dL — ABNORMAL HIGH (ref 70–100)

## 2023-03-27 NOTE — Progress Notes
Internal Medicine Inpatient Progress Note    Name: Raven [name]   MRN: [id number]   Admission Date: 03/24/2023  Today's Date:  03/27/2023  LOS: 3 days                     Assessment/Plan:    Raven [name] is a 77 y.o. 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    Interval updates 03/27/23:  - Hgb 6.8, transfuse 1un PRBC     Acute on chronic symptomatic anemia (MM, CKD), now worsening with presumed GIB  Melena  C/f GIB  Chronic anticoagulation ?triple therapy  Hx diverticulosis and sigmoid colon mass resection 07/2022  -transfused 1U PRBCs due to symptoms and h/o CAD 11/28; 1U PRBC 11/30  -she is on Aranesp outpatient with H/O  Plan;  -Transfuse 1un PRBC  -Hold triple therapy.   -Consider H pylori testing once bleeding resolved vs outpt if no plan EGD where bx could be obtained.  -Daily CBC  -GI consulted, no plans for EGD yet. Will coordinate EGD and need for bowel prep with GI on 12/1 for consideration scope 12/2.   -IV PPI BID      CAD s/p CABG and PCI  HFrEF 35%, ICM on milrinone infusion, CRT-D  Afib  Carotid artery Disease with CEA 2016  -pt reports being on triple therapy (asa, plavix, eliquis) unsure why this is the case according to previous notes  Plan:  -Consult cardiology: rec'd Eliquis monotherapy  -Pt with concerns about affordability Eliquis. Will need to address w [facility] and pharmacy to determine if able to qualify for assistance.  If unable would need to discuss with cardiology  -Consideration outpt [facility]. May not be feasible if unable to be on [facility] due to cost.   -continue BB, amiodarone  -continue milrinone pump  -hold eliquis/asa/plavix for now  -continue statin    Leukocytosis  -WBC 14.6 on admit, remains elevated.   -Hx of leukocytosis although most recently not present  -Pt is on dex 8mg  qweek prior to chemo.  -Afeb, no infectious sxs.   -Monitor off abx.      Multiple Myeloma  -follows [facility]  -continue ppx acyclovir (?not on doxy per pharm)  -continue dex (this is weekly, apparently)  -on outpatient darzalex     CKD4, stable  -monitor     DM2,  -hold PTA insulin, dm meds for now  -LDCF      Prophylaxis/Checklist:  Diet: DIET CARDIAC(LOW FAT/LOW SODIUM)   Lines/Access: Peripheral IV  Drains: None  DVT Ppx: PTA Therapeutic AC  Code: Full Code  Consults: GI, HF  PT/OT: Consulted. Home intermittent  Dispo: Inpt status; Anticipated dispo to home. EDD pending stability of Hgb and renal function, decision on EGD.       [name], MD  Hospitalist, Internal Medicine  Med Private V- [phone]     To contact, Voalte (preferred) the First Call for the Med Private team listed or page the team pager above.      This note was prepared in part using [employer], please excuse any typographical/dictation errors that may have resulted from  its use.  ________________________________________________________________________    Subjective  No acute events overnight.  Patient denies bowel movement once 24 hours.  She denies lightheadedness, chest pain, shortness of breath, abdominal pain, nausea, vomiting.  She reports her fatigue remains improved compared to presentation.  We discussed decreased hemoglobin and need for endoscopy evaluation with upper scope and consideration for colonoscopy.  She states understanding of this.  We discussed that this would not happen until Monday at the soonest and she is understanding    ROS:   A 10-point review of systems was completed.      Scheduled Meds:acyclovir (ZOVIRAX) tablet 400 mg, 400 mg, Oral, BID  amiodarone (CORDARONE) tablet 200 mg, 200 mg, Oral, QDAY  budesonide-formoterol HFA (SYMBICORT) 160-4.5 mcg/actuation inhalation 2 puff, 2 puff, Inhalation, BID  carvediloL (COREG) tablet 3.125 mg, 3.125 mg, Oral, BID w/meals  insulin aspart (U-100) (NOVOLOG FLEXPEN U-100 INSULIN) injection PEN 0-6 Units, 0-6 Units, Subcutaneous, ACHS (22)  pantoprazole (PROTONIX) injection 40 mg, 40 mg, Intravenous, ZOX(09-60)  rosuvastatin (CRESTOR) tablet 20 mg, 20 mg, Oral, QDAY  sertraline (ZOLOFT) tablet 50 mg, 50 mg, Oral, QDAY    Continuous Infusions:   milrinone (PRIMACOR) 20 mg/D5W 100 mL infusion 0.3 mcg/kg/min (03/27/23 0612)     PRN and Respiratory Meds:acetaminophen Q4H PRN, albuterol sulfate Q4H PRN, dextrose 50% (D50) IV PRN, melatonin QHS PRN, ondansetron Q6H PRN **OR** ondansetron (ZOFRAN) IV Q6H PRN, polyethylene glycol 3350 QDAY PRN, sennosides-docusate sodium QDAY PRN       Objective:     Physical Exam  General: in NAD. VS reviewed.  Pulmonary: No readily audible wheezes, rhonchi. No accessory muscle use. No increased WOB.   CV: RRR  Abdo: soft, non-distended.   Skin: no visible rashes or significant lesions  Neuro: Awake and alert. No focal neuro deficits.   Psych: Appropriate mood and affect    Lab Review  Pertinent labs reviewed. Please refer to results section for further information.    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

## 2023-03-27 NOTE — Progress Notes
Day 0700-1900    Pain: The patient is not having any pain.    Nutrition: Diet-  Cardiac/Low Fat/Sodium . Appetite- good.  Denies N/V.    GI/GU: Voids clear yellow urine.  LBM:  03/24/23.    Activity: UWA x 1/walker.  Up to bathroom and ambulated in hallway with PT/OT.    New events or follow-up:     No replacements today.    Intake and Output:   Stool output in mls-         Date 03/25/23 0701 - 03/26/23 0700 03/26/23 0701 - 03/27/23 0700   Shift 0701-1900 1901-0700 24 Hour Total 0701-1900 1901-0700 24 Hour Total   INTAKE   P.O. 477  477 680  680   I.V.(mL/kg/hr)    84  84   Shift Total(mL/kg) 477(6.5)  477(6.5) 764(10.3)  764(10.3)   OUTPUT   Urine(mL/kg/hr)  300(0.3) 300(0.2) 200  200     Urine  300 300 200  200   Other           Stool Occurrence 0 x 0 x 0 x        Urine Occurrence  2 x  2 x      Shift Total(mL/kg)  300(4.1) 300(4.1) 200(2.7)  200(2.7)   NET 477 -300 177 564  564   Weight (kg) 73 73 73 74.2 74.2 74.2       This RN provided education to patient. The following education topics were reviewed with patient:    Inpatient unit guide and Food guidelines in the hospital    Fall Risk Score: Total Fall Risk Score: 8.   The following risk factors were identified (see doc flow for JHFRAT specifics): None Age, Medications, and Patient care equipment.    The following interventions were implemented: Remembering to call for assistance with ambulation, Communicate recent fall and/or risk of harm, Assistance managing patient care equipment, Bed/chair alarm, Walking aides (gait belt, walker, cane, etc.), Toileting schedule every 2 hours, 1 person assist, Fall identifiers in place, and Standard mobility safety,  patient Verbalizes Understanding of fall risk factors and interventions. Patient compliance: Patient compliant with interventions.

## 2023-03-27 NOTE — Progress Notes
Float Pool END OF SHIFT/ JHFRAT NOTE    Admission Date: 03/24/2023    Acute events, interventions, provider communication: n/a    Patient Interventions and Education  Fall Risk/JHFRAT Interventions and Education: (Charting when applicable)  Elimination Interventions : N/A  Medications : Use of gait belt  and Stay within arm's reach during toileting/showering (i.e., dizziness, orthostasis)   Patient Care Equipment: Does not need assistance with patient care equipment when ambulating  Mobility: Assist x1  Cognition: Bed/Chair Alarm   Risk for Moderate/Major Injury: Age: >65 yrs    2. Restraints:  No     Restraint Goal: Patient will be free from injury while physically restrained.  See Docflowsheet for restraint documentation, interventions, education, etc.    Intake and Output:       Intake/Output Summary (Last 24 hours) at 03/27/2023 0553  Last data filed at 03/27/2023 0117  Gross per 24 hour   Intake 1004 ml   Output 850 ml   Net 154 ml              Last Bowel Movement Date: 03/24/23

## 2023-03-28 LAB — POC GLUCOSE
~~LOC~~ BKR POC GLUCOSE: 224 mg/dL — ABNORMAL HIGH (ref 70–100)
~~LOC~~ BKR POC GLUCOSE: 269 mg/dL — ABNORMAL HIGH (ref 70–100)
~~LOC~~ BKR POC GLUCOSE: 161 mg/dL — ABNORMAL HIGH (ref 70–100)
~~LOC~~ BKR POC GLUCOSE: 247 mg/dL — ABNORMAL HIGH (ref 70–100)
~~LOC~~ BKR POC GLUCOSE: 273 mg/dL — ABNORMAL HIGH (ref 70–100)
~~LOC~~ BKR POC GLUCOSE: 279 mg/dL — ABNORMAL HIGH (ref 70–100)

## 2023-03-28 LAB — PREPARE RBC - SUNQUEST
ANTIBODY SCREEN: POSITIVE
BLOOD EXPIRATION DATE: 202
CODING STATUS: 510
ISSUE DATE TIME: 202
ISSUE DATE TIME: 202
PRODUCT CODE: 0
PRODUCT CODE: 0
UNIT DIVISION: 0
UNIT DIVISION: 0
UNITS ORDERED: 2

## 2023-03-28 LAB — TYPE & CROSSMATCH
~~LOC~~ BKR ANTIBODY SCREEN: POSITIVE s — ABNORMAL LOW (ref 24.0–36.5)
~~LOC~~ BKR UNITS ORDERED: 2 fL (ref 80.0–100.0)

## 2023-03-28 NOTE — Progress Notes
Internal Medicine Inpatient Progress Note    Name: Raven [name]   MRN: [id number]   Admission Date: 03/24/2023  Today's Date:  03/28/2023  LOS: 4 days                     Assessment/Plan:    Raven [name] is a 77 y.o. 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    Interval updates 03/28/23:  - Hgb 7.8, CTM  - Discussed with GI fellow, plan EGD only 12/2. NPO MN     Acute on chronic symptomatic anemia (MM, CKD), now worsening with presumed GIB  Melena  C/f GIB  Chronic anticoagulation ?triple therapy  Hx diverticulosis and sigmoid colon mass resection 07/2022  -transfused 1U PRBCs due to symptoms and h/o CAD 11/28; 1U PRBC 11/30  -she is on Aranesp outpatient with H/O  Plan;  -Hold triple therapy. Consider re-trial while inpt to ensure no recurrent bleeding depending on EGD results.   -Consider H pylori testing once bleeding resolved vs outpt if no plan EGD where bx could be obtained.  -Daily CBC  -GI consulted, EGD tentative planned 12/2  -IV PPI BID, has completed 72hrs, continue through EGD then can likely transition to PO     CAD s/p CABG and PCI  HFrEF 35%, ICM on milrinone infusion, CRT-D  Afib  Carotid artery Disease with CEA 2016  -pt reports being on triple therapy (asa, plavix, eliquis) unsure why this is the case according to previous notes  Plan:  -Consult cardiology: rec'd Eliquis monotherapy  -Pt with concerns about affordability Eliquis. Will need to address w [facility] and pharmacy to determine if able to qualify for assistance.  If unable would need to discuss with cardiology  -Consideration outpt [facility]. May not be feasible if unable to be on [facility] due to cost.   -continue BB, amiodarone  -continue milrinone pump, continuous  -hold eliquis/asa/plavix for now  -continue statin    Leukocytosis  -WBC 14.6 on admit, remains elevated.   -Hx of leukocytosis although most recently not present  -Pt is on dex 8mg  qweek prior to chemo.  -Afeb, no infectious sxs.   -Monitor off abx.      Multiple Myeloma  -follows [facility]  -continue ppx acyclovir (?not on doxy per pharm)  -continue dex (this is weekly, apparently)  -on outpatient darzalex     CKD4, stable  -Cr baseline variable. 1.9-2.3. eGFR ranges 18-25.  Cr slightly higher during this admission but represents minimal to no decrease in GFR     DM2,  -hold PTA insulin, dm meds for now  -LDCF      Prophylaxis/Checklist:  Diet: DIET CARDIAC(LOW FAT/LOW SODIUM)   Lines/Access: Peripheral IV  Drains: None  DVT Ppx: PTA Therapeutic AC on hold for active bleeding  Code: Full Code  Consults: GI, HF  PT/OT: Consulted. Home intermittent  Dispo: Inpt status; Anticipated dispo to home. EDD pending EGD, stable Hgb, consideration inpt AC challenge.       Raven Loveless, MD  Hospitalist, Internal Medicine  Med Private V- [phone]     To contact, Voalte (preferred) the First Call  for the Med Private team listed or page the team pager above.      This note was prepared in part using [employer], please excuse any typographical/dictation errors that may have resulted from its use.  ________________________________________________________________________    Subjective  No acute events overnight. No pain, nausea, vomiting.  Small hard BM yesterday, dark colored.    No chest pain sob.     ROS:   A 10-point review of systems was completed.      Scheduled Meds:acyclovir (ZOVIRAX) tablet 400 mg, 400 mg, Oral, BID  amiodarone (CORDARONE) tablet 200 mg, 200 mg, Oral, QDAY  budesonide-formoterol HFA (SYMBICORT) 160-4.5 mcg/actuation inhalation 2 puff, 2 puff, Inhalation, BID  carvediloL (COREG) tablet 3.125 mg, 3.125 mg, Oral, BID w/meals  insulin aspart (U-100) (NOVOLOG FLEXPEN U-100 INSULIN) injection PEN 0-6 Units, 0-6 Units, Subcutaneous, ACHS (22)  pantoprazole (PROTONIX) injection 40 mg, 40 mg, Intravenous, AVW(09-81)  rosuvastatin (CRESTOR) tablet 20 mg, 20 mg, Oral, QDAY  sertraline (ZOLOFT) tablet 50 mg, 50 mg, Oral, QDAY    Continuous Infusions:   milrinone (PRIMACOR) 20 mg/D5W 100 mL infusion 0.3 mcg/kg/min (03/27/23 0612)     PRN and Respiratory Meds:acetaminophen Q4H PRN, albuterol sulfate Q4H PRN, dextrose 50% (D50) IV PRN, melatonin QHS PRN, ondansetron Q6H PRN **OR** ondansetron (ZOFRAN) IV Q6H PRN, polyethylene glycol 3350 QDAY PRN, sennosides-docusate sodium QDAY PRN       Objective:     Physical Exam  General: in NAD. VS reviewed.  Pulmonary: No readily audible wheezes, rhonchi. No accessory muscle use. No increased WOB.   CV: RRR  Abdo: soft, non-distended.   Skin: no visible rashes or significant lesions  Neuro: Awake and alert. No focal neuro deficits.   Psych: Appropriate mood and affect    Lab Review  Pertinent labs reviewed. Please refer to results section for further information.    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

## 2023-03-29 ENCOUNTER — Encounter: Admit: 2023-03-29 | Discharge: 2023-03-29 | Payer: MEDICARE

## 2023-03-29 ENCOUNTER — Inpatient Hospital Stay: Admit: 2023-03-29 | Discharge: 2023-03-30 | Payer: MEDICARE

## 2023-03-29 ENCOUNTER — Inpatient Hospital Stay: Admit: 2023-03-29 | Discharge: 2023-03-29 | Payer: MEDICARE

## 2023-03-29 LAB — POC GLUCOSE
~~LOC~~ BKR POC GLUCOSE: 218 mg/dL — ABNORMAL HIGH (ref 70–100)
~~LOC~~ BKR POC GLUCOSE: 190 mg/dL — ABNORMAL HIGH (ref 70–100)
~~LOC~~ BKR POC GLUCOSE: 198 mg/dL — ABNORMAL HIGH (ref 70–100)
~~LOC~~ BKR POC GLUCOSE: 202 mg/dL — ABNORMAL HIGH (ref 70–100)
~~LOC~~ BKR POC GLUCOSE: 295 mg/dL — ABNORMAL HIGH (ref 70–100)

## 2023-03-29 LAB — EGD REPORT

## 2023-03-29 MED ORDER — STERILE WATER/SIMETHICONE IRRIGATION
0 refills | Status: DC
Start: 2023-03-29 — End: 2023-03-29

## 2023-03-29 MED ORDER — PHENYLEPHRINE HCL IN 0.9% NACL 1 MG/10 ML (100 MCG/ML) IV SYRG
INTRAVENOUS | 0 refills | Status: DC
Start: 2023-03-29 — End: 2023-03-29

## 2023-03-29 MED ORDER — ROSUVASTATIN 10 MG PO TAB
10 mg | ORAL_TABLET | Freq: Every day | ORAL | 0 refills | Status: CN
Start: 2023-03-29 — End: ?

## 2023-03-29 MED ORDER — LIDOCAINE (PF) 20 MG/ML (2 %) IJ SOLN
INTRAVENOUS | 0 refills | Status: DC
Start: 2023-03-29 — End: 2023-03-29

## 2023-03-29 MED ORDER — ETOMIDATE 2 MG/ML IV SOLN
INTRAVENOUS | 0 refills | Status: DC
Start: 2023-03-29 — End: 2023-03-29

## 2023-03-29 MED ORDER — APIXABAN 5 MG PO TAB
5 mg | Freq: Two times a day (BID) | ORAL | 0 refills | Status: DC
Start: 2023-03-29 — End: 2023-04-01
  Administered 2023-03-29 – 2023-03-31 (×5): 5 mg via ORAL

## 2023-03-29 MED ORDER — PROPOFOL INJ 10 MG/ML IV VIAL
INTRAVENOUS | 0 refills | Status: DC
Start: 2023-03-29 — End: 2023-03-29

## 2023-03-29 MED ORDER — CLOPIDOGREL 75 MG PO TAB
75 mg | Freq: Every day | ORAL | 0 refills | Status: DC
Start: 2023-03-29 — End: 2023-03-29

## 2023-03-29 MED ORDER — ROSUVASTATIN 10 MG PO TAB
10 mg | Freq: Every day | ORAL | 0 refills | Status: DC
Start: 2023-03-29 — End: 2023-04-01
  Administered 2023-03-30 – 2023-03-31 (×2): 10 mg via ORAL

## 2023-03-29 MED ORDER — APIXABAN 5 MG PO TAB
5 mg | ORAL_TABLET | Freq: Two times a day (BID) | ORAL | 0 refills | Status: DC
Start: 2023-03-29 — End: 2023-03-31

## 2023-03-29 MED ORDER — PROPOFOL 10 MG/ML IV EMUL 20 ML (INFUSION)(AM)(OR)
INTRAVENOUS | 0 refills | Status: DC
Start: 2023-03-29 — End: 2023-03-29
  Administered 2023-03-29: 14:00:00 50 ug/kg/min via INTRAVENOUS

## 2023-03-29 NOTE — Anesthesia Pre-Procedure Evaluation
Anesthesia Pre-Procedure Evaluation    Name: Raven [name]      MRN: [id number]     DOB: 02/24/1946     Age: 77 y.o.     Sex: female   _________________________________________________________________________     Procedure Info:   Procedure Information       Date/Time: 03/29/23 0816    Procedure: ESOPHAGOGASTRODUODENOSCOPY WITH BIOPSY - FLEXIBLE    Location: ENDO 3 / ENDO/GI    Surgeons: Buckles, Vinnie Level, MD          Physical Assessment  Vital Signs (last filed in past 24 hours):  BP: 116/67 (12/02 0743)  Temp: 36.7 ?C (98 ?F) (12/02 0743)  Pulse: 94 (12/02 0743)  Respirations: 20 PER MINUTE (12/02 0743)  SpO2: 96 % (12/02 0743)  O2 Device: None (Room air) (12/02 0743)      Patient History   Allergies   Allergen Reactions    Bortezomib RASH     Noted in Provider note 8/22 cancer treatment drug    Allopurinol HIVES    Sulfa (Sulfonamide Antibiotics) NAUSEA ONLY        Current Medications    Medication Directions   acetaminophen (TYLENOL) 325 mg tablet Take three tablets by mouth every 8 hours as needed for Pain. Indications: pain   acyclovir (ZOVIRAX) 400 mg tablet Take one tablet by mouth twice daily.   albuterol sulfate (PROAIR HFA) 90 mcg/actuation HFA aerosol inhaler Inhale two puffs by mouth into the lungs every 6 hours as needed for Wheezing or Shortness of Breath.   amiodarone (CORDARONE) 200 mg tablet Take one tablet by mouth daily. Indications: prevention of recurrent atrial fibrillation   apixaban (ELIQUIS) 5 mg tablet Take one tablet by mouth twice daily. Indications: treatment to prevent blood clots in chronic atrial fibrillation   aspirin 81 mg chewable tablet Chew one tablet by mouth daily. Indications: stroke prevention   budesonide-formoterol HFA (SYMBICORT) 160-4.5 mcg/actuation aerosol inhaler Inhale two puffs by mouth into the lungs twice daily. Indications: bronchospasm prevention with COPD   carvediloL (COREG) 6.25 mg tablet Take one tablet by mouth twice daily with meals. cholecalciferol (VITAMIN D) 1,000 units tablet Take one tablet by mouth daily.   clopiDOGreL (PLAVIX) 75 mg tablet Take one tablet by mouth daily.   cyanocobalamin (VITAMIN B-12) 1,000 mcg tablet Take 5,000 Units by mouth daily.   dexAMETHasone (DECADRON) 4 mg tablet Take two tablets by mouth every 7 days. Patient is taking 8mg  on Thursdays prior to chemo   dicyclomine (BENTYL) 10 mg capsule Take one capsule by mouth daily.   empagliflozin ([name]) 25 mg tablet Take 10 mg by mouth daily.   EMU OIL (BULK) MISC Use  as directed. Apply topically as needed   esomeprazole DR (NEXIUM) 20 mg capsule Take one capsule by mouth daily.   febuxostat (ULORIC) 40 mg tablet Take one-half tablet by mouth daily. Indications: taken on chemo days   ferrous sulfate (FEOSOL) 325 mg (65 mg iron) tablet Take one tablet by mouth daily. Take on an empty stomach at least 1 hour before or 2 hours after food.   insulin detemir U-100 (LEVEMIR FLEXTOUCH) 100 unit/mL (3 mL) injection pen Inject fifteen Units under the skin at bedtime daily.   milrinone (PRIMACOR) 200 mcg/mL infusion Administer 22.68 mcg/min through vein Continuous. Indications: sudden and serious symptoms of heart failure called acute decompensated heart failure   nitroglycerin (NITROSTAT) 0.4 mg tablet Place one tablet under tongue every 5 minutes as needed for Chest Pain.  ONETOUCH VERIO TEST STRIPS test strip USE STRIP TO CHECK GLUCOSE TWICE DAILY FOR DIABETES MELLITUS   pantoprazole DR (PROTONIX) 40 mg tablet Take one tablet by mouth daily. Indications: gastritis   potassium chloride SR (K-DUR) 20 mEq tablet Take three tablets by mouth daily AND two tablets every evening. Indications: prevention of low potassium in the blood   rosuvastatin (CRESTOR) 20 mg tablet Take one tablet by mouth daily.   sertraline (ZOLOFT) 50 mg tablet Take one tablet by mouth daily.   torsemide (DEMADEX) 20 mg tablet Take 20 mg daily and an extra 20 mg on MWF.  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 LVEF 30%  Mild right ventricular enlargement.   Moderate to severe mitral regurgitation. Mitral annular calcification is present.   The aortic valve is not well seen.  Trace regurgitation. Calcified annulus with acoustic shadowing. Mean gradient is 4-5 mmHg.  Dilated annulus with severe tricuspid valve regurgitation   No pericardial effusion.     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)         No nausea      No vomiting        Severe diverticular disease        Neuro/Psych       No seizures        No CVA      Musculoskeletal         Back pain      Arthritis:  osteo        Endocrine/Other  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; FBS 110s- BS 190 on 03/29/23), well controlled, type 2, using insulin      Most recent Hgb A1C:7.2 - 9        Anemia      History of blood transfusion (07/2021- no reaction)        Malignancy (multiple myeloma- monthly Darzalex monthly (last dose 07/02/2022), uterine cancer- TAH/BSO; current colon mass s/p LAR):      Constitution - negative       Physical Exam    Airway Findings      Mallampati: I      TM distance: >3 FB      Neck ROM: full      Mouth opening: good    Dental Findings:       Upper dentures      Comments: 7 bottom teeth remain    Cardiovascular Findings:       Rhythm: regular      Rate: normal      Other findings: Murmur, peripheral edema (trace BLE)    Pulmonary Findings:       Breath sounds clear to auscultation.    Abdominal Findings:       Obese    Neurological Findings:       Alert and oriented x 3    Constitutional findings:       No acute distress      Well-developed       Diagnostic Tests  Hematology:   Lab Results   Component Value Date    HGB 7.5 03/29/2023    HGB 6.8 03/23/2023    HCT 23.7 03/29/2023    HCT 22 03/23/2023    PLTCT 238 03/29/2023    PLTCT 315 03/23/2023    WBC 11.70 03/29/2023    WBC 12.2 03/23/2023    NEUT 65 03/25/2023    NEUT 49 02/10/2023    ANC 8.9 03/26/2023    ANC 12.22 11/01/2022    ANC 2 10/05/2022    LYMPH 8 03/26/2023    LYMPH 10 11/01/2022    ALC 2.10 03/25/2023    ALC 1.98 02/10/2023    MONA 11 03/25/2023    MONA 15 02/10/2023    AMC 1.70 03/25/2023    AMC 0.98 02/10/2023    EOSA 8 03/25/2023    EOSA 8 09/23/2022    ABC 0.10 03/25/2023    ABC 0.04 02/10/2023    BASOPHILS 1 03/26/2023    MCV 90.3 03/29/2023    MCV 100 03/23/2023    MCH 28.6 03/29/2023    MCH 31 03/23/2023    MCHC 31.7 03/29/2023    MCHC 31 03/23/2023    MPV 8.3 03/29/2023    MPV 11.3 03/23/2023    RDW 29.5 03/29/2023    RDW 22.9 03/23/2023         General Chemistry:   Lab Results   Component Value Date    NA 137 03/29/2023    NA 141 03/23/2023    K 4.3 03/29/2023    K 4.5 03/23/2023    CL 104 03/29/2023    CL 109 03/23/2023    CO2 22 03/29/2023    CO2 25 03/23/2023    GAP 11 03/29/2023    GAP 7 03/23/2023    BUN 44 03/29/2023    BUN 49 03/23/2023    CR 2.59 03/29/2023    CR 2.74 03/23/2023    GLU 214 03/29/2023    GLU 129 03/23/2023    CA 8.2 03/29/2023  CA 8.9 03/23/2023    ALBUMIN 3.3 03/26/2023    ALBUMIN 3.4 03/23/2023    LACTIC 1.5 11/01/2022    MG 2.2 03/29/2023    MG 2.3 03/23/2023    TOTBILI 0.8 03/26/2023    TOTBILI 0.7 03/23/2023    PO4 4.2 03/26/2023    PO4 4.1 11/02/2022      Coagulation:   Lab Results   Component Value Date    PT 14.5 09/18/2022    PTT 33.2 03/24/2023    PTT 70.6 09/22/2022    INR 1.3 09/18/2022       PAC Plan    Alerts    Anesthesia Plan    ASA score: 4   Plan: MAC  NPO status: acceptable      Informed Consent  Anesthetic plan and risks discussed with patient.  Use of blood products discussed with patient  Blood Consent: consented      Plan discussed with: anesthesiologist, CRNA and surgeon/proceduralist.

## 2023-03-29 NOTE — Progress Notes
Internal Medicine Inpatient Progress Note    Name: Raven [name]   MRN: [id number]   Admission Date: 03/24/2023  Today's Date:  03/29/2023  LOS: 5 days                     Assessment/Plan:    Raven [name] is a 77 y.o. 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    Interval updates 03/29/23:  - Patient underwent EGD today which did show 1.5 cm friable polyp in the duodenal which was tx with clip x2.   - GI recommend resumption of Eliquis today and continue to monitor blood counts to ensure stability.   > Tentative plan to proceed with watchman as outpatient to stop Eliquis at that time and change to Plavix monotherapy.      # Acute on chronic symptomatic anemia (MM, CKD), now worsening with presumed GIB  # Melena  # C/f GIB  # Chronic anticoagulation ?triple therapy  # Hx diverticulosis and sigmoid colon mass resection 07/2022  -transfused 1U PRBCs due to symptoms and h/o CAD 11/28; 1U PRBC 11/30  -she is on Aranesp outpatient with H/O  - 12/2 EGD: 1.5 cm pedunculated friable polyp in duodenum s/p clip x2, no bleeding seen.   Plan;  > GI Following   - Recommend resuming Eliquis today.   > Continue to trend hgb bid   > Transfuse Hgb <7  > Continue IV PPI for now      # CAD s/p CABG and PCI  # HFrEF 35%, ICM on milrinone infusion, CRT-D  # Afib   # Carotid artery Disease with CEA 2016  -pt reports being on triple therapy (asa, plavix, eliquis) unsure why this is the case according to previous notes  Plan:  -Consult cardiology - Eliquis monotherapy w/ plans to proceed with o/p Watchman.   -Pt with concerns about affordability Eliquis. Will need to address w [facility] and pharmacy to determine if able to qualify for assistance.  If unable would need to discuss with cardiology  -Consideration outpt Watchman. May not be feasible if unable to be on [facility] due to cost.   -continue BB, amiodarone  -continue milrinone pump, continuous  -Stop Aspirin/Plavix at discharge.   -Continue statin    # Leukocytosis - Improved   -WBC 14.6 on admit, remains elevated.   -Hx of leukocytosis although most recently not present  -Pt is on dex 8mg  qweek prior to chemo.  -Afeb, no infectious sxs.   > Continue to monitor off antibiotics.      # Multiple Myeloma  -follows [facility]  -continue ppx acyclovir (?not on doxy per pharm)  -continue dex (this is weekly, apparently)  -on outpatient darzalex     # CKD4, stable  -Cr baseline variable. 1.9-2.3. eGFR ranges 18-25.  Cr slightly higher during this admission but represents minimal to no decrease in GFR  Plan:  > Daily lytes/renal fct  > Strict I/os  > CTM closely   > Avoid nephrotoxic agents     # DM2,  -hold PTA insulin, dm meds for now  Plan:  > LDCF  > Accuchek  ACHS       Prophylaxis/Checklist:  Diet: Diabetic Diet   Lines/Access: Peripheral IV  Drains: None  DVT Ppx: Eliquis resumed 12/2  Code: Full Code  Consults: GI, HF  PT/OT: Consulted. Home intermittent  Dispo: Inpt status; Anticipated dispo to home pending hgb stability.       MDM Complexity: High  Acute illness with threat to life  Spoke with GI, Reviewed unique labs, Ordered unique labs    [place], DO  Med Private V  Available on Voalte   ________________________________________________________________________    Subjective  Patient tolerated EGD this morning. She is currently asymptomatic. She reports no abd pain, n/v/d/c, hematochezia/melena. She is overall feeling back to her normal.     ROS:   A 10-point review of systems was completed.      Scheduled Meds:acyclovir (ZOVIRAX) tablet 400 mg, 400 mg, Oral, BID  amiodarone (CORDARONE) tablet 200 mg, 200 mg, Oral, QDAY  budesonide-formoterol HFA (SYMBICORT) 160-4.5 mcg/actuation inhalation 2 puff, 2 puff, Inhalation, BID  carvediloL (COREG) tablet 3.125 mg, 3.125 mg, Oral, BID w/meals  clopiDOGreL (PLAVIX) tablet 75 mg, 75 mg, Oral, QDAY  insulin aspart (U-100) (NOVOLOG FLEXPEN U-100 INSULIN) injection PEN 0-6 Units, 0-6 Units, Subcutaneous, ACHS (22)  pantoprazole (PROTONIX) injection 40 mg, 40 mg, Intravenous, WJX(91-47)  rosuvastatin (CRESTOR) tablet 20 mg, 20 mg, Oral, QDAY  sertraline (ZOLOFT) tablet 50 mg, 50 mg, Oral, QDAY    Continuous Infusions:   milrinone (PRIMACOR) 20 mg/D5W 100 mL infusion 0.3 mcg/kg/min (03/29/23 0132)     PRN and Respiratory Meds:acetaminophen Q4H PRN, albuterol sulfate Q4H PRN, dextrose 50% (D50) IV PRN, melatonin QHS PRN, ondansetron Q6H PRN **OR** ondansetron (ZOFRAN) IV Q6H PRN, polyethylene glycol 3350 QDAY PRN, sennosides-docusate sodium QDAY PRN       Objective:     Physical Exam  General: in NAD. VS reviewed.  Pulmonary: No readily audible wheezes, rhonchi. No accessory muscle use. No increased WOB.   CV: RRR  Abdo: soft, non-distended.   Skin: no visible rashes or significant lesions  Neuro: Awake and alert. No focal neuro deficits.   Psych: Appropriate mood and affect    Lab Review  Pertinent labs reviewed. Please refer to results section for further information.    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

## 2023-03-29 NOTE — Progress Notes
PHYSICAL THERAPY  NOTE      Name: Raven [name]   MRN: [id number]     DOB: 09/22/1945      Age: 77 y.o.  Admission Date: 03/24/2023     LOS: 5 days     Date of Service: 03/29/2023      Patient was unavailable for Physical Therapy, off unit for EGD.  Rehabilitation services will continue to follow and provide intervention as indicated.     Therapist: Malen Gauze, PT  Date: 03/29/2023

## 2023-03-29 NOTE — Progress Notes
Pharmacy High Risk Medication Review    A high risk medication review has been performed for Raven [name] by a pharmacy team member.      Medication class(es) to review on inpatient med list:  None Identified      The medications class(es) are noted to be high risk to mobility or mentation in patients >77 years of age. If your patient has been CAM positive (diagnosed with delirium) at any point during this hospitalization then it would be particularly helpful to review these medications. Each medication has its own unique risks and benefits to the patient, we ask that these medications are reviewed and considered for de-prescribing where appropriate.     Consider reviewing [email or web address] for anticholinergic burden calculation.  If you need further assistance identifying or de-prescribing, please contact pharmacy.      Current Inpatient Medications:  Scheduled Meds:acyclovir (ZOVIRAX) tablet 400 mg, 400 mg, Oral, BID  amiodarone (CORDARONE) tablet 200 mg, 200 mg, Oral, QDAY  [Transfer Hold] budesonide-formoterol HFA (SYMBICORT) 160-4.5 mcg/actuation inhalation 2 puff, 2 puff, Inhalation, BID  carvediloL (COREG) tablet 3.125 mg, 3.125 mg, Oral, BID w/meals  [Transfer Hold] insulin aspart (U-100) (NOVOLOG FLEXPEN U-100 INSULIN) injection PEN 0-6 Units, 0-6 Units, Subcutaneous, ACHS (22)  [Transfer Hold] pantoprazole (PROTONIX) injection 40 mg, 40 mg, Intravenous, ZOX(09-60)  [Transfer Hold] rosuvastatin (CRESTOR) tablet 20 mg, 20 mg, Oral, QDAY  [Transfer Hold] sertraline (ZOLOFT) tablet 50 mg, 50 mg, Oral, QDAY    Continuous Infusions:   milrinone (PRIMACOR) 20 mg/D5W 100 mL infusion 0.3 mcg/kg/min (03/29/23 0132)     PRN and Respiratory Meds:[Transfer Hold] acetaminophen Q4H PRN, [Transfer Hold] albuterol sulfate Q4H PRN, dextrose 50% (D50) IV PRN, [Transfer Hold] melatonin QHS PRN, [Transfer Hold] ondansetron Q6H PRN **OR** [Transfer Hold] ondansetron (ZOFRAN) IV Q6H PRN, [Transfer Hold] polyethylene glycol 3350 QDAY PRN, [Transfer Hold] sennosides-docusate sodium QDAY PRN        Thank you,  Domenick Gong, [facility]  03/29/2023

## 2023-03-29 NOTE — Care Plan
 Problem: Discharge Planning  Goal: Participation in plan of care  Outcome: Goal Ongoing  Goal: Knowledge regarding plan of care  Outcome: Goal Ongoing  Goal: Prepared for discharge  Outcome: Goal Ongoing     Problem: High Fall Risk  Goal: High Fall Risk  Outcome: Goal Ongoing

## 2023-03-29 NOTE — Anesthesia Post-Procedure Evaluation
Post-Anesthesia Evaluation    Name: Lindzie Goe      MRN: [id number]     DOB: 01-31-1946     Age: 77 y.o.     Sex: female   __________________________________________________________________________     Procedure Information       Anesthesia Start Date/Time: 03/29/23 0820    Procedures:       ESOPHAGOGASTRODUODENOSCOPY WITH BIOPSY - FLEXIBLE      ESOPHAGOGASTRODUODENOSCOPY WITH SNARE REMOVAL TUMOR/ POLYP/ OTHER LESION - FLEXIBLE    Location: ENDO 3 / ENDO/GI    Surgeons: Buckles, Vinnie Level, MD            Post-Anesthesia Vitals  Pulse: 91 (12/02 1130)   Vitals Value Taken Time   BP 111/62 03/29/23 1012   Temp 36.8 ?C (98.3 ?F) 03/29/23 1012   Pulse 91 03/29/23 1130   Respirations 18 PER MINUTE 03/29/23 1012   SpO2 100 % 03/29/23 1012   O2 Device Nasal cannula 03/29/23 1012   ABP     ART BP           Post Anesthesia Evaluation Note    Evaluation location: Pre/Post  Patient participation: recovered; patient participated in evaluation  Level of consciousness: alert  Pain management: adequate    Hydration: normovolemia  Temperature: 36.0?C - 38.4?C  Airway patency: adequate    Perioperative Events       Post-op nausea and vomiting: no PONV    Postoperative Status  Cardiovascular status: hemodynamically stable  Respiratory status: spontaneous ventilation        Perioperative Events  There were no known complications for this encounter.

## 2023-03-29 NOTE — Progress Notes
There was a drop in the Hb and patient was given a unit of PRBC. Will plan for EGD on 12/2/20224. NPO at midnight.

## 2023-03-29 NOTE — Unmapped
Brief Operative Note    Name: Raven [name] is a 77 y.o. female     DOB: November 21, 1945             MRN#: [id number]    DATE OF OPERATION: 03/29/2023    Date:  03/29/2023        Preoperative Dx:   Melena [K92.1]    Post-op Diagnosis      * Melena [K92.1]    Procedure(s):  ESOPHAGOGASTRODUODENOSCOPY WITH BIOPSY - FLEXIBLE    Surgeons and Role:     * Naseem Adler, Vinnie Level, MD - Primary     * Allena Katz, Harsh K, MBBS - Fellow    Findings:    Small hiatal hernia  Normal stomach  1.5 cm pedunculated polyp in second portion of duodenum. There was some friability of the polyp head and it was decided this could be the source of upper GI bleeding given DAPT.   A clip was placed across the base and the polyp removed by snare cautery and retrieved with a roth net. A second clip was placed across the stalk after polypectomy. There was no bleeding seen. A few other diminutive duodenal polyps were noted.      Estimated Blood Loss: Minimal    Specimen(s) Removed/Disposition:   ID Type Source Tests Collected by Time Destination   1 : duodenal polyp Tissue Small Intestine SURGICAL PATHOLOGY Mahir Prabhakar, Vinnie Level, MD 03/29/2023 [phone]        Complications:  None    Disposition:  Return to inpatient unit for ongoing care     Awaiting pathology  Restart Plavix as inpatient  Consider repeat EGD and colonoscopy if hgb drops again    Prudy Feeler, MD  Pager [phone]

## 2023-03-29 NOTE — Progress Notes
PHYSICAL THERAPY  MOBILITY NOTE      Name: Raven [name]   MRN: [id number]     DOB: 12-06-1945      Age: 77 y.o.  Admission Date: 03/24/2023     LOS: 5 days     Date of Service: 03/29/2023    As part of the ongoing rehabilitation plan of care, patient was mobilized with assistance from the rehabilitation technician .    Mobility  Patient Turn/Position: Supine;Self  Progressive Mobility Level: Walk in hallway  Distance Walked (feet): 120 ft  Level of Assistance: Stand by assistance (CGA)  Assistive Device: Walker  Activity Limited By: Lydia Guiles / Medical Devices    Rehab Technician: Gonzella Lex  Date: 03/29/2023

## 2023-03-30 ENCOUNTER — Encounter: Admit: 2023-03-30 | Discharge: 2023-03-30 | Payer: MEDICARE

## 2023-03-30 LAB — IRON + BINDING CAPACITY + %SAT+ FERRITIN
~~LOC~~ BKR % SATURATION: 18 % — ABNORMAL LOW (ref 28–42)
~~LOC~~ BKR FERRITIN: 539 ng/mL — ABNORMAL HIGH (ref 10–200)
~~LOC~~ BKR IRON BINDING: 177 ug/dL — ABNORMAL LOW (ref 270–380)
~~LOC~~ BKR IRON: 32 g/dL — ABNORMAL LOW (ref 50–160)
~~LOC~~ BKR TRANSFERRIN: 119 mg/dL — ABNORMAL LOW (ref 185–336)

## 2023-03-30 LAB — POC GLUCOSE
~~LOC~~ BKR POC GLUCOSE: 158 mg/dL — ABNORMAL HIGH (ref 70–100)
~~LOC~~ BKR POC GLUCOSE: 232 mg/dL — ABNORMAL HIGH (ref 70–100)
~~LOC~~ BKR POC GLUCOSE: 212 mg/dL — ABNORMAL HIGH (ref 70–100)

## 2023-03-30 LAB — CBC
~~LOC~~ BKR MPV: 8.5 fL (ref 7.0–11.0)
~~LOC~~ BKR PLATELET COUNT: 236 10*3/uL (ref 150–400)

## 2023-03-30 NOTE — Progress Notes
Gastroenterology Consultation - Follow Up      HISTORY OF PRESENT ILLNESS  Raven [name] is a pleasant 77 y.o. female with a history of Cancer of uterus, CAD, DM, Dyslipidemia, Fluid retention, Heart disease, and HTN who presents to the ED for low hemoglobin.  Hemoglobin 6.8 on admission , for which she received 1U PRBC. She also had one episode of black tarry stool. She denies a previous history of GI bleed. Denies NSAID use, abdominal pain, or nausea. Last EGD in 08/2022 - full report below. She is currently on Asprin, Plavix, and Eliquis; unsure why she was continued on all three agents. ASA, Plavix and Eliquis held in setting of GI bleed. Cardiology consulted on admission and recommends Eliquis as monotherapy, when safe, with outpatient evaluation for watchman device. GI performed repeat EGD on 12/2 - full report below.      24 Hour Events/Subjective:   Reviewed endoscopy report with patient; path pending. She reports having a dark brown/black formed stool this AM. Denies any nausea or abdominal pain. Reports some occasional coughing with PO intake. Hgb 7.5 this AM. 7.7 yesterday.     REVIEW OF MEDICAL RECORDS  Past Medical History:   Diagnosis Date    Anxiety     Arthritis     Asthma     Asymptomatic stenosis of right carotid artery     Back pain     Cancer of uterus (HCC)     COPD (chronic obstructive pulmonary disease) (HCC)     Coronary artery disease     Diabetes mellitus (HCC)     Dyslipidemia     Flank mass     Fluid retention     Heart disease     Hypertension     Other malignant neoplasm without specification of site     Sleep apnea     Compliant w CPAP    Vision decreased      Surgical History:   Procedure Laterality Date    ROBOT ASSISTED LOW ANTERIOR RESECTION N/A 08/10/2022    Performed by Benetta Spar, MD at [facility] OR    ESOPHAGOGASTRODUODENOSCOPY WITH BIOPSY - FLEXIBLE N/A 09/17/2022    Performed by Tempie Hoist, DO at [name] ENDO    CATHETERIZATION RIGHT HEART N/A 09/18/2022 Performed by Cath, Physician at [facility] CATH LAB    REMOVAL AND REPLACEMENT IMPLANTABLE DEFIBRILLATOR GENERATOR - SINGLE LEAD SYSTEM Left 11/04/2022    Performed by Kathreen Cornfield, MD at [facility] EP LAB    INSERTION/ REPLACEMENT PERMANENT PACEMAKER WITH ATRIAL LEAD Left 11/04/2022    Performed by Kathreen Cornfield, MD at [facility] EP LAB    Injection Venography Extremity Left 11/04/2022    Performed by Kathreen Cornfield, MD at [facility] EP LAB    BONE MARROW BIOPSY      CARDIAC DEFIBRILLATOR PLACEMENT      [employer]    CARDIAC SURGERY      CABG 3 V    CAROTID ENDARDECTOMY Right     COLONOSCOPY      HX CORONARY STENT PLACEMENT      HX HEART CATHETERIZATION      HX HYSTERECTOMY      TUNNELED VENOUS PORT PLACEMENT Right     Chest     Social History     Socioeconomic History    Marital status: Widowed   Tobacco Use    Smoking status: Former     Current packs/day: 0.00     Average packs/day: 1 pack/day  for 20.0 years (20.0 ttl pk-yrs)     Types: Cigarettes     Start date: 11     Quit date: 2010     Years since quitting: 14.9     Passive exposure: Never    Smokeless tobacco: Never   Vaping Use    Vaping status: Never Used   Substance and Sexual Activity    Alcohol use: Not Currently    Drug use: Not Currently     Allergies   Allergen Reactions    Bortezomib RASH     Noted in Provider note 8/22 cancer treatment drug    Allopurinol HIVES    Sulfa (Sulfonamide Antibiotics) NAUSEA ONLY     Current Facility-Administered Medications   Medication Dose Route Frequency Provider Last Rate Last Admin    acetaminophen (TYLENOL) tablet 650 mg  650 mg Oral Q4H PRN Wiemholt, Edward B, DO        acyclovir (ZOVIRAX) tablet 400 mg  400 mg Oral BID Wiemholt, Edward B, DO   400 mg at 03/30/23 0909    albuterol sulfate (PROAIR HFA) inhaler 2 puff  2 puff Inhalation Q4H PRN Wiemholt, Edward B, DO        amiodarone (CORDARONE) tablet 200 mg  200 mg Oral QDAY Wiemholt, Edward B, DO   200 mg at 03/30/23 5366    apixaban (ELIQUIS) tablet 5 mg  5 mg Oral BID Guss Bunde M, DO   5 mg at 03/30/23 4403    budesonide-formoterol HFA (SYMBICORT) 160-4.5 mcg/actuation inhalation 2 puff  2 puff Inhalation BID Wiemholt, Edward B, DO   2 puff at 03/30/23 0900    carvediloL (COREG) tablet 3.125 mg  3.125 mg Oral BID w/meals Darlyne Russian, MD   3.125 mg at 03/30/23 0909    dextrose 50% (D50) syringe 25-50 mL  12.5-25 g Intravenous PRN Wiemholt, Edward B, DO        insulin aspart (U-100) (NOVOLOG FLEXPEN U-100 INSULIN) injection PEN 0-6 Units  0-6 Units Subcutaneous ACHS (22) Wiemholt, Edward B, DO   3 Units at 03/29/23 1715    melatonin tablet 5 mg  5 mg Oral QHS PRN Wiemholt, Edward B, DO   5 mg at 03/29/23 2048    milrinone (PRIMACOR) 20 mg/D5W 100 mL infusion  0.3 mcg/kg/min (Dosing Weight) Intravenous Continuous Wiemholt, Edward B, DO 7 mL/hr at 03/30/23 0905 0.3 mcg/kg/min at 03/30/23 0905    ondansetron (ZOFRAN ODT) rapid dissolve tablet 4 mg  4 mg Oral Q6H PRN Wiemholt, Edward B, DO        Or    ondansetron HCL (PF) (ZOFRAN (PF)) injection 4 mg  4 mg Intravenous Q6H PRN Wiemholt, Edward B, DO   4 mg at 03/27/23 1016    pantoprazole (PROTONIX) injection 40 mg  40 mg Intravenous BID(11-21) Wiemholt, Edward B, DO   40 mg at 03/29/23 2048    polyethylene glycol 3350 (MIRALAX) packet 17 g  1 packet Oral QDAY PRN Wiemholt, Edward B, DO        rosuvastatin (CRESTOR) tablet 10 mg  10 mg Oral QDAY Chambers, [place] M, DO   10 mg at 03/30/23 4742    sennosides-docusate sodium (SENOKOT-S) tablet 1 tablet  1 tablet Oral QDAY PRN Dineen Kid B, DO   1 tablet at 03/29/23 1524    sertraline (ZOLOFT) tablet 50 mg  50 mg Oral QDAY Wiemholt, Edward B, DO   50 mg at 03/30/23 0909     I personally reviewed past  medical history, family history, and social history.     REVIEW OF SYSTEMS  HEENT: No loss of vision or bleeding gums   Cardiovascular: No chest pain    Respiratory: No wheezing   Gastrointestinal: See HPI above.     PHYSICAL EXAMINATION  Vital Signs: BP 108/63  - Pulse 93  - Temp 36.7 ?C (98.1 ?F)  - Ht 157.5 cm (5' 2)  - Wt 74.2 kg (163 lb 9.3 oz)  - SpO2 98%  - BMI 29.92 kg/m?   Body mass index is 29.92 kg/m?Marland Kitchen  Physical Exam  Constitutional:       General: She is awake.   HENT:      Right Ear: Hearing normal.   Eyes:      Conjunctiva/sclera: Conjunctivae normal.   Cardiovascular:      Rate and Rhythm: Normal rate.   Pulmonary:      Effort: Pulmonary effort is normal. No respiratory distress.   Abdominal:      General: Bowel sounds are normal. There is no distension.      Palpations: Abdomen is soft.      Tenderness: There is no abdominal tenderness.   Neurological:      Mental Status: She is alert.   Psychiatric:         Mood and Affect: Mood normal.         Speech: Speech normal.        RECENT LABS - Pertinent labs reviewed by me personally and abbreviated in HPI above.     RADIOLOGY - No pertinent imaging reports to review    GI PROCEDURES - I have personally reviewed the following pertinent GI procedure images and/or reports:     EGD (03/29/2023)  - Normal esophagus   - 3 cm type-I sliding hiatal hernia.   - Normal stomach.   - A few duodenal polyps.   - 1.5 cm pedunculated duodenal polyp. Resected and retrieved. Two clips (MR conditional) were placed. Clip manufacturer: [employer].     EGD (09/17/2022)  - Normal esophagus.   - Z-line regular, 37 cm from the incisors.   - Gastroesophageal flap valve classified as Hill Grade III (minimal fold, loose to endoscope, hiatal hernia likely).   - 3 cm hiatal hernia.   - Congestive gastropathy.   - Erythematous mucosa in the prepyloric region of the stomach. Biopsied.   - A single 6 mm sessile polyp with no bleeding and no stigmata of recent bleeding was found in the gastric body.   - A single 6 mm sessile polyp with no bleeding was found in the first portion of the duodenum.   - A single 15 mm pedunculated polyp with no bleeding was found in the second portion of the duodenum.   - Polyps were not removed today due to last plavix use 5/21. Final Pathology Diagnosis:   A. Gastric oxyntic and antral-type mucosa, gastric biopsy r/o H pylori, biopsy:   Oxyntic-type mucosa with focally active gastritis.   Antral-type mucosa with features of reactive gastropathy.   Immunohistochemical stain for H. pylori is negative.     ASSESSMENT AND PLAN  Uterine cancer  CAD s/p CABG and PCI - was on Plavix, ASA and Eliquis prior; started Eliquis on 12/2, with plans for monotherapy  Anemia requiring transfusion - 2U PRBC given this admission thus far  Melena - resolved  Hiatal hernia  CKD    Recommendations:  - Await pathology results (performing endoscopist to follow up pathology)   -  If no overt bleeding in the next 48 hours and hgb remains stable, can resume the other antithrombotic agent.   - If recurrent GI bleeding occurs, then EGD and Colonoscopy is recommended  - Recommend PPI daily indefinitely  - Trend H/H, transfuse pRBCs per protocol if Hgb <7gm/dl   - Notify us immediately if patient experiences brisk GI bleeding and/or a sudden non-dilutional 2 gm drop in Hgb    Total Time Today was 35 minutes in the following activities: Preparing to see the patient, Obtaining and/or reviewing separately obtained history, Performing a medically appropriate examination and/or evaluation, Counseling and educating the patient/family/caregiver, Documenting clinical information in the electronic or other health record, Independently interpreting results (not separately reported) and communicating results to the patient/family/caregiver, and Care coordination (not separately reported)     Patient discussed and plan agreed upon with Dr. Wende Crease. GI will sign off. Thank you for this consultation. It has been a pleasure to be a part of the care team. Please feel free to contact the GI consult team with any questions or concerns.    Epifanio Lesches, MSN, APRN, FNP-C  [facility]  Division of Gastroenterology  Available via [place]  [phone], otherwise contact GI fellow on call

## 2023-03-30 NOTE — Case Management (ED)
Case Management Progress Note    NAME:Raven [name]                          MRN: [id number]              DOB:[birth year]          AGE: 77 y.o.  ADMISSION DATE: 03/24/2023             DAYS ADMITTED: LOS: 6 days      Today's Date: 03/30/2023    PLAN:   DC home with [facility] and [facility].     Expected Discharge Date: 03/31/2023   Is Patient Medically Stable: No, Please explain: Labs, GI.   Are there Barriers to Discharge? no    INTERVENTION/DISPOSITION:  Discharge Planning                Spoke with [facility]. Patient receives her Milirone infusion through [facility].     Transportation              Will the Patient Use Family Transport?: Yes  Transportation Name, Phone and Availability #1: Bertis Ruddy [phone]  Support                 Info or Referral                 Positive SDOH Domains and Potential Barriers                   Medication Needs                                  Financial                 Legal                 Other                 Discharge Disposition                                                   Selected Continued Care - Admitted Since 03/24/2023       [facility]       Service Provider Services Address Phone Fax Patient Preferred    [facility] [address] [place] [address] [phone] --              [facility]       Service Provider Services Address Phone Fax Patient Preferred    [facility] [address], [place] [address] [phone] --                    Mertie Moores, BSN, RN  Nurse Case Manager  Available on [place]

## 2023-03-30 NOTE — Case Management (ED)
Case Management Progress Note    NAME:Raven [name]                          MRN: [id number]              DOB:May 27, 1945          AGE: 77 y.o.  ADMISSION DATE: 03/24/2023             DAYS ADMITTED: LOS: 6 days      Today's Date: 03/30/2023    PLAN: Anticipate pt to dc home pending medical stability.    Expected Discharge Date: 03/31/2023   Is Patient Medically Stable: No, Please explain: one more day monitoring  Are there Barriers to Discharge? no    INTERVENTION/DISPOSITION:  Discharge Planning                 Transportation              Will the Patient Use Family Transport?: Yes  Transportation Name, Phone and Availability #1: Bertis Ruddy [phone]  Support                 Info or Referral                 Positive SDOH Domains and Potential Barriers                   Medication Needs                 SW notified by bedside RN that pt is requesting to speak with her regarding assistance with her medication co pays as pt is in the donut hole and has seen an increased cost of copay.    SW met with pt at beside to discuss above. Pt shared that her Eliquis and [name] are her most expensive copays. SW provided education on the Medicare Part D donut hole and that it should restart January. SW informed pt that she is doing some research on it and will follow up with pt tomorrow. Pt agreeable and thanked SW for assistance.    Pharmacist notified SW that pt copay for Eliquis is $160 for one month supply and she is unsure at this time what pt's copay for [name] is.    SW found an [employer] savings card and a [place] that may be helpful for pt. SW to con't to research additional options and will f/u with pt tomorrow before dc.    Pharmacist to call pt's preferred pharmacy to confirm they take the above mentioned discount cards and any other options that may be helpful to reducing the copay costs for pt. Financial                 Legal                 Other                 Discharge Disposition  Selected Continued Care - Admitted Since 03/24/2023       [facility]       Service Provider Services Address Phone Fax Patient Preferred    [facility] [address] [place] [address] [phone] --                          Salomon Fick, LMSW  Available on [place]  [phone]

## 2023-03-30 NOTE — Progress Notes
PHYSICAL THERAPY  MOBILITY NOTE      Name: Raven [name]   MRN: [id number]     DOB: [birth year]      Age: 77 y.o.  Admission Date: 03/24/2023     LOS: 6 days     Date of Service: 03/30/2023    As part of the ongoing rehabilitation plan of care, patient was mobilized with assistance from the rehabilitation technician .    Mobility  Patient Turn/Position: Self;Supine  Progressive Mobility Level: Walk in hallway  Distance Walked (feet): 100 ft  Level of Assistance: Stand by assistance (CGA)  Assistive Device: Walker  Activity Limited By: Lydia Guiles / Medical Devices    Rehab Technician: Gonzella Lex  Date: 03/30/2023

## 2023-03-31 ENCOUNTER — Encounter: Admit: 2023-03-31 | Discharge: 2023-03-31 | Payer: MEDICARE

## 2023-03-31 ENCOUNTER — Inpatient Hospital Stay: Admit: 2023-03-31 | Discharge: 2023-04-01 | Payer: MEDICARE

## 2023-03-31 LAB — POC GLUCOSE
~~LOC~~ BKR POC GLUCOSE: 237 mg/dL — ABNORMAL HIGH (ref 70–100)
~~LOC~~ BKR POC GLUCOSE: 163 mg/dL — ABNORMAL HIGH (ref 70–100)
~~LOC~~ BKR POC GLUCOSE: 266 mg/dL — ABNORMAL HIGH (ref 70–100)

## 2023-03-31 MED ORDER — TORSEMIDE 20 MG PO TAB
0 refills | Status: SS
Start: 2023-03-31 — End: ?

## 2023-03-31 MED ORDER — POTASSIUM CHLORIDE 20 MEQ PO TBTQ
0 refills | Status: SS
Start: 2023-03-31 — End: ?

## 2023-03-31 MED ORDER — EPINEPHRINE 1 MG/ML (1 ML) IJ SOLN
.3 mg | INTRAMUSCULAR | 0 refills | Status: DC | PRN
Start: 2023-03-31 — End: 2023-04-01

## 2023-03-31 MED ORDER — DIPHENHYDRAMINE HCL 50 MG/ML IJ SOLN
25 mg | INTRAVENOUS | 0 refills | Status: DC | PRN
Start: 2023-03-31 — End: 2023-04-01

## 2023-03-31 MED ORDER — INSULIN DETEMIR 100 UNIT/ML SC FLEXTOUCH
0 refills | Status: SS
Start: 2023-03-31 — End: ?

## 2023-03-31 MED ORDER — MILRINONE IN 5 % DEXTROSE 20 MG/100 ML (200 MCG/ML) IV PGBK
INTRAVENOUS | 0 refills | Status: DC
Start: 2023-03-31 — End: 2023-04-03

## 2023-03-31 MED ORDER — IRON SUCROSE 300 MG IVPB
300 mg | Freq: Once | INTRAVENOUS | 0 refills | Status: CP
Start: 2023-03-31 — End: ?
  Administered 2023-03-31 (×2): 300 mg via INTRAVENOUS

## 2023-03-31 MED ORDER — APIXABAN 5 MG PO TAB
5 mg | ORAL_TABLET | Freq: Two times a day (BID) | ORAL | 0 refills | Status: SS
Start: 2023-03-31 — End: ?

## 2023-03-31 MED ORDER — CARVEDILOL 3.125 MG PO TAB
3.125 mg | ORAL_TABLET | Freq: Two times a day (BID) | ORAL | 0 refills | Status: SS
Start: 2023-03-31 — End: ?

## 2023-03-31 MED ORDER — TORSEMIDE 20 MG PO TAB
ORAL | 0 refills | 67.50000 days | Status: DC
Start: 2023-03-31 — End: 2023-03-31

## 2023-03-31 MED FILL — CARVEDILOL 3.125 MG PO TAB: 3.125 mg | ORAL | 90 days supply | Qty: 180 | Fill #1 | Status: CN

## 2023-03-31 NOTE — Procedures
VAT consulted for PIV and no longer needed on arrival

## 2023-03-31 NOTE — Discharge Instructions - Pharmacy
Discharge Summary      Name: Raven [name]  Medical Record Number: [id number]        Account Number:  [id number]  Date Of Birth:  Dec 01, 1945                         Age:  77 y.o.  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  A-fib  Carotid artery disease with CEA  Leukocytosis resolved  Multiple myeloma  CKD 4 stable  Type 2 diabetes mellitus    Hospital Diagnoses:  Hospital Problems        Active Problems    * (Principal) GI bleed    Chronic kidney disease, stage 4 (severe) (HCC)    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    Cardiomyopathy, ischemic    Chronic obstructive pulmonary disease, unspecified (HCC)    Anticoagulated    Melena    Persistent atrial fibrillation (HCC)     Present on Admission:   GI bleed   Melena   Persistent atrial fibrillation (HCC)   Anticoagulated   Cardiomyopathy, ischemic   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   Chronic kidney disease, stage 4 (severe) (HCC)   Chronic obstructive pulmonary disease, unspecified (HCC)        Significant Past Medical History        Anxiety  Arthritis  Asthma  Asymptomatic stenosis of right carotid artery  Back pain  Cancer of uterus (HCC)  COPD (chronic obstructive pulmonary disease) (HCC)  Coronary artery disease  Diabetes mellitus (HCC)  Dyslipidemia  Flank mass  Fluid retention  Heart disease  Hypertension  Other malignant neoplasm without specification of site  Sleep apnea      Comment:  Compliant w CPAP  Vision decreased    Allergies   Bortezomib, Allopurinol, and Sulfa (sulfonamide antibiotics)    Brief Hospital Course   The patient was admitted and the following issues were addressed during this hospitalization: (with pertinent details including admission exam/imaging/labs).      77 year old female with past medical history of heart failure with reduced EF, ischemic cardiomyopathy, coronary artery disease with CABG, history of PCI, severe MR, ICD in situ, carotid artery disease with prior endarterectomy, COPD, CKD 3, multiple myeloma, endometrial cancer status post hysterectomy, diverticulosis status post sigmoid colon mass resection on 08/10/2022 presented to the hospital with melena and acute on chronic symptomatic anemia, patient has blood transfusion 1 unit on 11/28 and another event on 11/30.  GI was consulted, status post EGD on 12/2, EGD showed 1.5 cm pedunculated viable polyp in duodenum, status post clipping, no bleeding seen, patient was on triple therapy, cardiology was consulted, she was started on monotherapy with Eliquis on 12/3 she remained asymptomatic, no bleeding noted, hemoglobin remained stable.  Today, patient is asymptomatic, deemed stable enough to go home with outpatient follow-up with primary care physician.  During hospitalization she received IV Venofer for iron deficiency anemia.    Blood sugars fairly controlled with [name], she did not receive any long-acting insulin while in hospital, recommend to hold at the time of discharge and follow-up with primary care physician.      Items Needing Follow Up   Pending items or areas that need to be addressed at follow up: pcp     Pending Labs and Follow Up Radiology    Pending labs and/or radiology review at this time of discharge are listed below: Please note- any labs with collected status will not have a result; if this area is blank, there are no items for review.   Pending Labs       Order Current Status    POC GLUCOSE In process    POC GLUCOSE In process    SURGICAL PATHOLOGY In process              Medications      Medication List      CHANGE how you take these medications     carvediloL 3.125 mg tablet; Commonly known as: COREG; Dose: 3.125 mg;   Take one tablet by mouth twice daily with meals. Take with food.    Indications: heart failute with rEF; For: heart failute with rEF;   Quantity: 180 tablet; Refills: 0; What changed: medication strength, how   much to take, additional instructions   insulin detemir U-100 100 unit/mL (3 mL) injection pen; Commonly known   as: LEVEMIR FLEXTOUCH; Resume after speaking with your PCP or   endocrinology  Indications: type 2 diabetes mellitus; For: type 2 diabetes   mellitus; Refills: 0; What changed: how much to take, how to take this,   when to take this, additional instructions   potassium chloride SR 20 mEq tablet; Commonly known as: K-DUR; Resume   after cardiology follow up  Indications: prevention of low potassium in   the blood; For: prevention of low potassium in the blood; Refills: 0; What   changed: See the new instructions.   torsemide 20 mg tablet; Commonly known as: DEMADEX; Resume after   cardiology follow up  Indications: accumulation of fluid resulting from   chronic heart failure; For: accumulation of fluid resulting from chronic   heart failure; Refills: 0; What changed: additional instructions     CONTINUE taking these medications     acetaminophen 325 mg tablet; Commonly known as: TYLENOL; Dose: 975 mg;   Take three tablets by mouth every 8 hours as needed for Pain. Indications:   pain; For: pain; Quantity: 40 tablet; Refills: 0   acyclovir 400 mg tablet; Commonly known as: ZOVIRAX; Dose: 400 mg;   Refills: 0   albuterol sulfate 90 mcg/actuation HFA aerosol inhaler; Commonly known   as: PROAIR HFA; Dose: 2 puff; Refills: 0   amiodarone 200 mg tablet; Commonly known as: CORDARONE; Dose: 200 mg;   Take one tablet by mouth daily. Indications: prevention of recurrent   atrial fibrillation; For: prevention of recurrent atrial fibrillation;   Quantity: 90 tablet; Refills: 1   apixaban 5 mg tablet; Commonly known as: ELIQUIS; Dose: 5 mg; Take one   tablet by mouth twice daily. Indications: afib; For: afib; Quantity: 60   tablet; Refills: 0   budesonide-formoterol HFA 160-4.5 mcg/actuation aerosol inhaler;   Commonly known as: SYMBICORT; Dose: 2 puff; Inhale two puffs by mouth into   the lungs twice daily. Indications: bronchospasm prevention with COPD;   For: bronchospasm prevention with COPD;  Quantity: 30.6 g; Refills: 0   dexAMETHasone 4 mg tablet; Commonly known as: DECADRON; Dose: 8 mg;   Refills: 0   dicyclomine 10 mg capsule; Commonly known as: BENTYL; Dose: 10 mg;   Refills: 0   empagliflozin 25 mg tablet; Commonly known as: [name]; Dose: 25 mg;   Refills: 0   EMU OIL (BULK) MISC; Refills: 0   febuxostat 40 mg tablet; Commonly known as: ULORIC; Dose: 20 mg; Take   one-half tablet by mouth daily. Indications: taken on chemo days; For:   taken on chemo days; Quantity: 45 tablet; Refills: 0   ferrous sulfate 325 mg (65 mg iron) tablet; Commonly known as: FEOSOL;   Dose: 325 mg; Refills: 0   milrinone 200 mcg/mL infusion; Commonly known as: PRIMACOR; Indications:   sudden and serious symptoms of heart failure called acute decompensated   heart failure; For: sudden and serious symptoms of heart failure called   acute decompensated heart failure; Refills: 0   nitroglycerin 0.4 mg tablet; Commonly known as: NITROSTAT; Dose: 0.4 mg;   Refills: 0   ONETOUCH VERIO TEST STRIPS test strip; Generic drug: blood sugar   diagnostic; Refills: 0   pantoprazole DR 40 mg tablet; Commonly known as: PROTONIX; Dose: 40 mg;   Take one tablet by mouth daily. Indications: gastritis; For: gastritis;   Quantity: 90 tablet; Refills: 1   rosuvastatin 20 mg tablet; Commonly known as: CRESTOR; Dose: 20 mg;   Refills: 0   sertraline 50 mg tablet; Commonly known as: ZOLOFT; Dose: 1 tablet;   Refills: 0   VITAMIN B-12 1,000 mcg tablet; Generic drug: cyanocobalamin (vitamin   B-12); Dose: 5,000 Units; Refills: 0   VITAMIN D3 1,000 units tablet; Generic drug: CHOLEcalciferoL (vitamin   D3); Dose: 1,000 Units; Refills: 0     STOP taking these medications     aspirin 81 mg chewable tablet   clopiDOGreL 75 mg tablet; Commonly known as: PLAVIX   esomeprazole DR 20 mg capsule; Commonly known as: NexIUM       Return Appointments and Scheduled Appointments     Scheduled appointments:      May 03, 2023 9:00 AM  Office visit with Fredricka Bonine, APRN-NP  Cardiovascular Medicine: [facility] (CVM Exam) [address]  [address]  [place] [address]  [phone]     Sep 16, 2023 1:30 PM  Office visit with Arneta Cliche, MD  Cardiovascular Medicine: [facility] (CVM Exam) [address]  [address]  [place] [address]  [phone]     Sep 17, 2023 2:30 PM  ICD Check with CMPB3 PACEMAKER  Cardiovascular Medicine: [facility], [facility] [address] (CVM Procedural) [phone] [address]  [address]  [place] [address]  [phone]     Sep 17, 2023 3:00 PM  Office visit with Smith Robert, MD  Cardiovascular Medicine: [facility], [address] (CVM Exam) [phone] [address]  [address]  [place] [address]  [phone]          Contact information for after-discharge care                [facility]       [facility]    Phone: [phone]    Fax: [phone]    Where: [address], [place] [phone]    Service: Home Health Services  [facility]       *[facility]    Phone: [phone]    Fax: [phone]    Where: [address], [place] [address]    Service: Home Infusion and Injection                  Consults, Procedures, Diagnostics, Micro, Pathology   Consults: Cardiology and GI  Surgical Procedures & Dates: None  Significant Diagnostic Studies, Micro and Procedures: noted in brief hospital course  Significant Pathology: none              Wound:      Wounds Abrasion Left;Proximal;Dorsal Thigh (Active)   03/24/23 1601   Wound Type: Abrasion   Orientation: Left;Proximal;Dorsal   Location: Thigh   Wound Location Comments:    Initial Wound Site Closure:    Initial Dressing Placed:    Initial Cycle:    Initial Suction Setting (mmHg):    Pressure Injury Stages:    Pressure Injury Present Within 24 Hours of Hospital Admission:    If This Pressure Injury Is Suspected to Be Device Related, Please Select the Device::    Is the Wound Open or Closed:    Wound Assessment Purple;Red 03/31/23 1146   Peri-wound Assessment Bruised 03/31/23 1146   Wound Drainage Amount None 03/31/23 1146   Wound Dressing Status None/open to air 03/31/23 1146   Number of days: 7                           Discharge Disposition, Condition   Patient Disposition:  home   Condition at Discharge: Stable    Code Status   Full Code    Patient Instructions     Activity       Activity as Tolerated   As directed      It is important to keep increasing your activity level after you leave the hospital.  Moving around can help prevent blood clots, lung infection (pneumonia) and other problems.  Gradually increasing the number of times you are up moving around will help you return to your normal activity level more quickly.  Continue to increase the number of times you are up to the chair and walking daily to return to your normal activity level. Begin to work toward your normal activity level at discharge          Diet       Cardiac Diet   As directed      Limiting unhealthy fats and cholesterol is the most important step you can take in reducing your risk for cardiovascular disease.  Unhealthy fats include saturated and trans fats.  Monitor your sodium and cholesterol intake.  Restrict your sodium to 2g (grams) or 2000mg  (milligrams) daily, and your cholesterol to 200mg  daily.    If you have questions regarding your diet at home, you may contact a dietitian at [phone].             Discharge education provided to patient., Signs and Symptoms:   Report these signs and symptoms       Report These Signs and Symptoms   As directed      Please contact your doctor if you have any of the following symptoms: temperature higher than 100.4 degrees F, uncontrolled pain, persistent nausea and/or vomiting, or difficulty breathing        , Education: , and Others Instructions:   Other Orders  Questions About Your Stay   Complete by: As directed      Discharging attending physician: Al Corpus    Order comments: If you have an emergency after discharge, please dial [phone].    You may contact your discharging physician up to 7 days after discharge for questions about your hospitalization, discharge instructions, or medications by calling [phone] during regular business hours (8AM-4PM) and asking to speak to the doctor listed on the discharge information.       If you are not calling during business hours, ask for the on-call doctor.    If you have new  or worsening symptoms, you may be directed to your primary care provider (PCP) for ongoing questions, an Emergency Department, or an Urgent Care Clinic for a more immediate evaluation.    For all calls or questions more than 7 days after discharge, please contact your primary care provider (PCP).    For medications after discharge: pain (opioid) medicine cannot be refilled or prescribed by calling your discharging physician.  These medications need to be filled by your primary care provider (PCP).  Regular refill requests should be directed to your primary care provider (PCP).            Additional Orders: Case Management, Supplies, Home Health     Home Health/DME                HOME HEALTH/DME  ONCE        Comments: Home Health/Durable Medical Equipment Order Details    Patient Name:  Raven [name]                   Medical Record Number:   [id number]    Patient Diagnosis & ICD 10 Codes:  GI bleed [K92.2] Chronic kidney disease, stage 4 (severe) (HCC) [N18.4]   Chronic obstructive pulmonary disease, unspecified (HCC) [J44.9]   Patient Height: 157.5 cm (5' 2)   Patient Weight: 74.2 kg (163 lb 9.3 oz)    Provider Information:  MD Name: Domenic Schwab [id number], F: [phone]  MD NPI: [id number]      Agency Instructions:     Patient now stable for discharge. Please resume the following disciplines: RN, PT, OT   RN to complete general assessment, including vitals with temperature, monitor and teach patient and/or caregiver medication management and compliance, monitor and teach pain management and pain medication compliance when necessary. Monitor and teach signs and symptoms of infection, monitor and teach disease management, including signs and symptoms of diet, who and when to call, hospital readmission avoidance. Please see attached AVS for additional discharge instruction.    I certify that this patient is under my care and that I, or a nurse practitioner or physician's assistant working with me, had a face-to-face encounter that meets the physician's face-to-face encounter requirements with this patient on 03/31/2023.    This patient is under my care, and I have initiated the establishment of the plan of care.  This patient will be followed by a physician after discharge, who will periodically review the plan of care.     Patient is on continuous milrinone (PRIMACOR) 20 mg/D5W 100 mL infusion. Admin Dose: 23.49 mcg/min. Please resume at discharge.   Please deliver medication to bedside and connect patient before dc.     Clinical findings to support homebound status: Medically contraindicated  Clinical findings to support home care services: Unstable clinical condition and Requires instructions/administration of injections/IV therapy   Question Answer Comment  Attending Name/Contact Juliane Guest [phone], F: [phone]    PCP Name/Contact Barbaraann Rondo [phone], F: [phone]    Home Health to Follow PCP                             Signed:  Al Corpus, MD  03/31/2023      cc:  Primary Care Physician:  Barbaraann Rondo   Verified    Referring physicians:  No ref. provider found   Additional provider(s):        Did we miss something? If additional records are needed, please fax a request on office letterhead to [phone]. Please include the patient's name, date of birth, fax number and type of information needed. Additional request can be made by email at [email or web address]. For general questions of information about electronic records sharing, call [phone].

## 2023-03-31 NOTE — Progress Notes
PHYSICAL THERAPY  MOBILITY NOTE      Name: Raven [name]   MRN: [id number]     DOB: [birth year]      Age: 77 y.o.  Admission Date: 03/24/2023     LOS: 7 days     Date of Service: 03/31/2023    As part of the ongoing rehabilitation plan of care, patient was mobilized with assistance from the rehabilitation technician .    Mobility  Patient Turn/Position: Self  Progressive Mobility Level: Walk in hallway  Distance Walked (feet): 120 ft  Level of Assistance: Stand by assistance (CGA)  Assistive Device: Walker  Activity Limited By: Raven [name] / Medical Devices    Rehab Technician: Gonzella Lex  Date: 03/31/2023

## 2023-03-31 NOTE — Progress Notes
Encounter Date: 03/31/2023 11:18 AM    Name: Raven [name]   MRN: [id number]    Admission Date:  03/24/2023   LOS: 7 days       ASSESSMENT AND PLAN   Raven [name] is a 77 y.o. 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          # Acute on chronic symptomatic anemia (MM, CKD), now worsening with presumed GIB  # Melena  # C/f GIB  # Chronic anticoagulation ?triple therapy  # Hx diverticulosis and sigmoid colon mass resection 07/2022  -transfused 1U PRBCs due to symptoms and h/o CAD 11/28; 1U PRBC 11/30  -she is on Aranesp outpatient with H/O  - 12/2 EGD: 1.5 cm pedunculated friable polyp in duodenum s/p clip x2, no bleeding seen.   Plan;  Patient is Eliquis was started on 12/3, remained asymptomatic, no bleeding, hemoglobin remained stable around 7.2         # CAD s/p CABG and PCI  # HFrEF 35%, ICM on milrinone infusion, CRT-D  # Afib   # Carotid artery Disease with CEA 2016  -pt reports being on triple therapy (asa, plavix, eliquis) unsure why this is the case according to previous notes  Plan:  -Continue Eliquis monotherapy patient is plans to proceed with outpatient Watchman procedure  Discussed with case management, patient can afford Eliquis.  Continue PTA amiodarone and beta-blocker and milrinone drip  Discontinue aspirin Plavix.         # Leukocytosis - Improved   -WBC 14.6 on admit, remains elevated.   -Hx of leukocytosis although most recently not present  -Pt is on dex 8mg  qweek prior to chemo.  -Afeb, no infectious sxs.   > Continue to monitor off antibiotics.      # Multiple Myeloma  -[facility]s [facility]  -continue ppx acyclovir (?not on doxy per pharm)  -continue dex (this is weekly, apparently)  -on outpatient darzalex     # CKD4, stable  -Cr baseline variable. 1.9-2.3. eGFR ranges 18-25.  Cr slightly higher during this admission but represents minimal to no decrease in GFR  Plan:  > Daily lytes/renal fct  > Strict I/os  > CTM closely   > Avoid nephrotoxic agents     # DM2,  -Patient blood sugars fairly controlled with [name], hold Levemir at this time, patient needs to discuss with primary care physician at discharge.  Plan:  > LDCF  > Accuchek ACHS     VTE Ppx:   Continuous Infusions:    milrinone (PRIMACOR) 20 mg/D5W 100 mL infusion 0.3 mcg/kg/min (03/31/23 0018)     CODE STATUS: Full Code    --------------------------------------------------------------------------------  SUBJECTIVE:  Gen: no fever, no chills  Cardio: no chest pain, no sob, no leg swellings, no syncope   Pulmonology: no sob, no wheezing  GI: no abdominal pain, no nausea, no vomiting  Hb stable at 7.2   Soft blood pressures   BMP- CR 2.61   Low iron but high ferrtign   Doing better,   Patient is seen this morning, she denies any symptoms, discussed with nursing staff and case management.  Case management reports patient cannot afford Eliquis, her iron counts are low, denies any melena, denies any other symptoms.  She would like to be discharged today.  Vitals checked, mostly soft blood pressures, holding Lasix at discharge.    Medications:  Scheduled Meds: acyclovir (ZOVIRAX) tablet 400 mg, 400 mg, Oral, BID  amiodarone (CORDARONE) tablet 200 mg, 200 mg, Oral, QDAY  apixaban (ELIQUIS) tablet 5 mg, 5 mg, Oral, BID  budesonide-formoterol HFA (SYMBICORT) 160-4.5 mcg/actuation inhalation 2 puff, 2 puff, Inhalation, BID  carvediloL (COREG) tablet 3.125 mg, 3.125 mg, Oral, BID w/meals  insulin aspart (U-100) (NOVOLOG FLEXPEN U-100 INSULIN) injection PEN 0-6 Units, 0-6 Units, Subcutaneous, ACHS (22)  pantoprazole (PROTONIX) injection 40 mg, 40 mg, Intravenous, ZOX(09-60)  rosuvastatin (CRESTOR) tablet 10 mg, 10 mg, Oral, QDAY  sertraline (ZOLOFT) tablet 50 mg, 50 mg, Oral, QDAY        OBJECTIVE:                       Vital Signs: Last Filed                 Vital Signs: 24 Hour Range   BP: 98/49 (12/04 1042)  Temp: 36.9 ?C (98.5 ?F) (12/04 1042)  Pulse: 88 (12/04 1042)  Respirations: 16 PER MINUTE (12/04 1042)  SpO2: 96 % (12/04 1042)  O2 Device: None (Room air) (12/04 1042) BP: (93-107)/(49-61)   Temp:  [36.7 ?C (98 ?F)-36.9 ?C (98.5 ?F)]   Pulse:  [86-95]   Respirations:  [16 PER MINUTE-20 PER MINUTE]   SpO2:  [94 %-97 %]   O2 Device: None (Room air)     Vitals:    03/24/23 1330 03/26/23 0727   Weight: 73 kg (161 lb) 74.2 kg (163 lb 9.3 oz)         Intake/Output Summary:  (Last 24 hours)    Intake/Output Summary (Last 24 hours) at 03/31/2023 1118  Last data filed at 03/31/2023 0030  Gross per 24 hour   Intake --   Output 200 ml   Net -200 ml         Weight Change:    BMI: Body mass index is 29.92 kg/m?Marland Kitchen    PHYSICAL EXAM:  Gen: , No distress, resting comfortably,   Neck: No JVD  Lungs: Normal breath sounds, no wheezing, normal air entry, no distress  Heart: S1, S2 present, RRR  Abdomen: Soft, non tender, BS present,   Extremities: No cyanosis, No edema   Psychiatric: Normal mood, normal affect  Neuro: Alert, Oriented times 3  Skin:  Warm, No jaundice. No cyanosis      Laboratory Data:  24-hour labs:    Results for orders placed or performed during the hospital encounter of 03/24/23 (from the past 24 hours)   POC GLUCOSE    Collection Time: 03/30/23 12:11 PM   Result Value Ref Range    Glucose, POC 232 (H) 70 - 100 mg/dL   IRON + BINDING CAPACITY + %SAT+ FERRITIN    Collection Time: 03/30/23  1:56 PM   Result Value Ref Range    Iron 32 (L) 50 - 160 ?g/dL    Iron Binding-TIBC 454 (L) 270 - 380 mcg/dL    % Saturation 18 (L) 28 - 42 %    Transferrin 119 (L) 185 - 336 mg/dL    Ferritin 098 (H) 10 - 200 ng/mL   POC GLUCOSE    Collection Time: 03/30/23  4:40 PM Result Value Ref Range  Glucose, POC 212 (H) 70 - 100 mg/dL   POC GLUCOSE    Collection Time: 03/30/23 10:04 PM   Result Value Ref Range    Glucose, POC 237 (H) 70 - 100 mg/dL   MAGNESIUM    Collection Time: 03/31/23  3:42 AM   Result Value Ref Range    Magnesium 2.2 1.6 - 2.6 mg/dL   BASIC METABOLIC PANEL    Collection Time: 03/31/23  3:42 AM   Result Value Ref Range    Sodium 135 (L) 137 - 147 mmol/L    Potassium 4.2 3.5 - 5.1 mmol/L    Chloride 104 98 - 110 mmol/L    Glucose 141 (H) 70 - 100 mg/dL    Blood Urea Nitrogen 43 (H) 7 - 25 mg/dL    Creatinine 4.01 (H) 0.40 - 1.00 mg/dL    Calcium 8.3 (L) 8.5 - 10.6 mg/dL    CO2 22 21 - 30 mmol/L    Anion Gap 10 3 - 12    Glomerular Filtration Rate (GFR) 18 (L) >60 mL/min   CBC AND DIFF    Collection Time: 03/31/23  3:42 AM   Result Value Ref Range    White Blood Cells 11.40 (H) 4.50 - 11.00 10*3/uL    Red Blood Cells 2.52 (L) 4.00 - 5.00 10*6/uL    Hemoglobin 7.2 (L) 12.0 - 15.0 g/dL    Hematocrit 02.7 (L) 36.0 - 45.0 %    MCV 90.4 80.0 - 100.0 fL    MCH 28.6 26.0 - 34.0 pg    MCHC 31.6 (L) 32.0 - 36.0 g/dL    RDW 25.3 (H) 66.4 - 15.0 %    Platelet Count 231 150 - 400 10*3/uL    MPV 8.5 7.0 - 11.0 fL    Neutrophils 62 41 - 77 %    Lymphocytes 15 (L) 24 - 44 %    Monocytes 13 (H) 4 - 12 %    Eosinophils 8 (H) 0 - 5 %    Basophils 1 0 - 2 %    Absolute Neutrophil Count 7.10 (H) 1.80 - 7.00 10*3/uL    Absolute Lymph Count 1.80 1.00 - 4.80 10*3/uL    Absolute Monocyte Count 1.50 (H) 0.00 - 0.80 10*3/uL    Absolute Eosinophil Count 0.90 (H) 0.00 - 0.45 10*3/uL    Absolute Basophil Count 0.10 0.00 - 0.20 10*3/uL   POC GLUCOSE    Collection Time: 03/31/23  7:50 AM   Result Value Ref Range    Glucose, POC 163 (H) 70 - 100 mg/dL     Glucose: (!) 403 (47/42/59 0342)  POC Glucose (Download): (!) 163 (03/31/23 0750)  No results found.    Total time spent for patient care is more than 35 minutes    Al Corpus, MD  03/31/2023  11:18 AM

## 2023-03-31 NOTE — Case Management (ED)
Case Management Progress Note    NAME:Raven [name]                          MRN: [id number]              DOB:1945-08-08          AGE: 78 y.o.  ADMISSION DATE: 03/24/2023             DAYS ADMITTED: LOS: 7 days      Today's Date: 03/31/2023    PLAN: Anticipate pt to dc home pending medical stability.     Expected Discharge Date: 03/31/2023   Is Patient Medically Stable: Yes   Are there Barriers to Discharge? no    INTERVENTION/DISPOSITION:  Discharge Planning                 Transportation              Will the Patient Use Family Transport?: Yes  Transportation Name, Phone and Availability #1: [facility] [phone]  Support                 Info or Referral                 Positive SDOH Domains and Potential Barriers                   Medication Needs                 Pharmacist to notify SW regarding if [facility] will accept coupon cards.    SW attended huddle. Pharmacist updated on Eliquis copay is $160. Pt does qualify for the one time free trial, but concern that copay in January may be higher.    SW and Pharmacist met with pt at bedside to discuss above. SW provided pt with the coupon cards just in case. Pharmacist provided information and education to the pt about medication coverage. Pt understood and had no further questions.                                    Financial                 Legal                 Other                 Discharge Disposition                                                                                                                                                      Selected Continued Care - Admitted Since 03/24/2023       [facility]  Service Provider Services Address Phone Fax Patient Preferred    [facility] [address] [place] [address] [phone] --              [facility]       Service Provider Services Address Phone Fax Patient Preferred    [facility] [address], [place] [address] [phone] --                          Salomon Fick, LMSW  Available on [place]  [phone]

## 2023-04-01 ENCOUNTER — Encounter: Admit: 2023-04-01 | Discharge: 2023-04-01 | Payer: MEDICARE

## 2023-04-01 DIAGNOSIS — Z833 Family history of diabetes mellitus: Secondary | ICD-10-CM

## 2023-04-01 DIAGNOSIS — Z951 Presence of aortocoronary bypass graft: Secondary | ICD-10-CM

## 2023-04-01 DIAGNOSIS — Z90710 Acquired absence of both cervix and uterus: Secondary | ICD-10-CM

## 2023-04-01 DIAGNOSIS — Z809 Family history of malignant neoplasm, unspecified: Secondary | ICD-10-CM

## 2023-04-01 DIAGNOSIS — Z7901 Long term (current) use of anticoagulants: Secondary | ICD-10-CM

## 2023-04-01 DIAGNOSIS — E1122 Type 2 diabetes mellitus with diabetic chronic kidney disease: Secondary | ICD-10-CM

## 2023-04-01 DIAGNOSIS — Z955 Presence of coronary angioplasty implant and graft: Secondary | ICD-10-CM

## 2023-04-01 DIAGNOSIS — I5022 Chronic systolic (congestive) heart failure: Secondary | ICD-10-CM

## 2023-04-01 DIAGNOSIS — Z823 Family history of stroke: Secondary | ICD-10-CM

## 2023-04-01 DIAGNOSIS — J4489 Other specified chronic obstructive pulmonary disease (HCC): Secondary | ICD-10-CM

## 2023-04-01 DIAGNOSIS — Z7984 Long term (current) use of oral hypoglycemic drugs: Secondary | ICD-10-CM

## 2023-04-01 DIAGNOSIS — K449 Diaphragmatic hernia without obstruction or gangrene: Secondary | ICD-10-CM

## 2023-04-01 DIAGNOSIS — K921 Melena: Secondary | ICD-10-CM

## 2023-04-01 DIAGNOSIS — I130 Hypertensive heart and chronic kidney disease with heart failure and stage 1 through stage 4 chronic kidney disease, or unspecified chronic kidney disease: Secondary | ICD-10-CM

## 2023-04-01 DIAGNOSIS — Z87891 Personal history of nicotine dependence: Secondary | ICD-10-CM

## 2023-04-01 DIAGNOSIS — Z8249 Family history of ischemic heart disease and other diseases of the circulatory system: Secondary | ICD-10-CM

## 2023-04-01 DIAGNOSIS — Z794 Long term (current) use of insulin: Secondary | ICD-10-CM

## 2023-04-01 DIAGNOSIS — I4819 Other persistent atrial fibrillation: Secondary | ICD-10-CM

## 2023-04-01 DIAGNOSIS — Z7982 Long term (current) use of aspirin: Secondary | ICD-10-CM

## 2023-04-01 DIAGNOSIS — I2510 Atherosclerotic heart disease of native coronary artery without angina pectoris: Secondary | ICD-10-CM

## 2023-04-01 DIAGNOSIS — D62 Acute posthemorrhagic anemia: Secondary | ICD-10-CM

## 2023-04-01 DIAGNOSIS — Z95810 Presence of automatic (implantable) cardiac defibrillator: Secondary | ICD-10-CM

## 2023-04-01 DIAGNOSIS — Z7951 Long term (current) use of inhaled steroids: Secondary | ICD-10-CM

## 2023-04-01 DIAGNOSIS — R570 Cardiogenic shock: Secondary | ICD-10-CM

## 2023-04-01 DIAGNOSIS — R040 Epistaxis: Secondary | ICD-10-CM

## 2023-04-01 DIAGNOSIS — I255 Ischemic cardiomyopathy: Secondary | ICD-10-CM

## 2023-04-01 DIAGNOSIS — C9000 Multiple myeloma not having achieved remission: Secondary | ICD-10-CM

## 2023-04-01 DIAGNOSIS — Z8542 Personal history of malignant neoplasm of other parts of uterus: Secondary | ICD-10-CM

## 2023-04-01 DIAGNOSIS — N184 Chronic kidney disease, stage 4 (severe): Secondary | ICD-10-CM

## 2023-04-01 DIAGNOSIS — K3189 Other diseases of stomach and duodenum: Secondary | ICD-10-CM

## 2023-04-01 DIAGNOSIS — Z888 Allergy status to other drugs, medicaments and biological substances status: Secondary | ICD-10-CM

## 2023-04-01 DIAGNOSIS — Z7902 Long term (current) use of antithrombotics/antiplatelets: Secondary | ICD-10-CM

## 2023-04-01 DIAGNOSIS — D72829 Elevated white blood cell count, unspecified: Secondary | ICD-10-CM

## 2023-04-01 DIAGNOSIS — I340 Nonrheumatic mitral (valve) insufficiency: Secondary | ICD-10-CM

## 2023-04-01 DIAGNOSIS — E785 Hyperlipidemia, unspecified: Secondary | ICD-10-CM

## 2023-04-01 DIAGNOSIS — D500 Iron deficiency anemia secondary to blood loss (chronic): Secondary | ICD-10-CM

## 2023-04-01 DIAGNOSIS — K317 Polyp of stomach and duodenum: Secondary | ICD-10-CM

## 2023-04-01 LAB — DEVICE EVALUATION - ICD (IMPLANTABLE CARDIOVERTER DEFIBRILLATOR)
EP A PACING%: 1 % — ABNORMAL HIGH (ref 4.50–11.00)
EP DEVICE BATTERY VOLTAGE: 3
EP GENERATOR IMPLANT DATE: 202
EP GENERATOR SERIAL#: 810
EP RV PACING%: 1 % — ABNORMAL LOW (ref 4.00–5.00)

## 2023-04-02 ENCOUNTER — Encounter: Admit: 2023-04-02 | Discharge: 2023-04-02 | Payer: MEDICARE

## 2023-04-02 ENCOUNTER — Emergency Department: Admit: 2023-04-02 | Discharge: 2023-04-02 | Payer: MEDICARE

## 2023-04-02 ENCOUNTER — Inpatient Hospital Stay: Admit: 2023-04-02 | Discharge: 2023-04-09 | Disposition: A | Discharge status: Home Health | Payer: MEDICARE

## 2023-04-02 DIAGNOSIS — K922 Gastrointestinal hemorrhage, unspecified: Secondary | ICD-10-CM

## 2023-04-02 DIAGNOSIS — D649 Anemia, unspecified: Secondary | ICD-10-CM

## 2023-04-02 LAB — URINALYSIS MICROSCOPIC REFLEX TO CULTURE
~~LOC~~ BKR RBC, UA: 0 /HPF — ABNORMAL LOW (ref 5.0–8.0)
~~LOC~~ BKR SQUAMOUS EPI CELLS: 0 /HPF — AB (ref 7–40)
~~LOC~~ BKR WBC, UA: 0 /HPF (ref 1.005–1.030)

## 2023-04-02 LAB — URINALYSIS DIPSTICK REFLEX TO CULTURE
~~LOC~~ BKR LEUKOCYTES: NEGATIVE
~~LOC~~ BKR NITRITE: NEGATIVE
~~LOC~~ BKR URINE BILE: NEGATIVE mmol/L (ref 21–30)
~~LOC~~ BKR URINE BLOOD: NEGATIVE — ABNORMAL HIGH (ref 3–12)
~~LOC~~ BKR URINE KETONE: NEGATIVE U/L (ref 7–56)

## 2023-04-02 LAB — COVID INFLUENZA A/B AND RSV PCR

## 2023-04-02 LAB — CBC
~~LOC~~ BKR RBC COUNT: 2.4 10*6/uL — ABNORMAL LOW (ref 4.00–5.00)
~~LOC~~ BKR WBC COUNT: 15 10*3/uL — ABNORMAL HIGH (ref 4.50–11.00)

## 2023-04-02 LAB — COMPREHENSIVE METABOLIC PANEL
~~LOC~~ BKR CREATININE: 2.5 mg/dL — ABNORMAL HIGH (ref 0.40–1.00)
~~LOC~~ BKR SODIUM, SERUM: 142 mmol/L — ABNORMAL LOW (ref 137–147)

## 2023-04-02 MED ORDER — ONDANSETRON 4 MG PO TBDI
4 mg | ORAL | 0 refills | Status: DC | PRN
Start: 2023-04-02 — End: 2023-04-09

## 2023-04-02 MED ORDER — ROSUVASTATIN 10 MG PO TAB
10 mg | Freq: Every day | ORAL | 0 refills | Status: DC
Start: 2023-04-02 — End: 2023-04-09
  Administered 2023-04-03 – 2023-04-09 (×7): 10 mg via ORAL

## 2023-04-02 MED ORDER — SERTRALINE 50 MG PO TAB
50 mg | Freq: Every day | ORAL | 0 refills | Status: DC
Start: 2023-04-02 — End: 2023-04-09
  Administered 2023-04-03 – 2023-04-09 (×7): 50 mg via ORAL

## 2023-04-02 MED ORDER — INSULIN ASPART 100 UNIT/ML SC FLEXPEN
0-6 [IU] | Freq: Before meals | SUBCUTANEOUS | 0 refills | Status: DC
Start: 2023-04-02 — End: 2023-04-09
  Administered 2023-04-04: 14:00:00 1 [IU] via SUBCUTANEOUS

## 2023-04-02 MED ORDER — POLYETHYLENE GLYCOL 3350 17 GRAM PO PWPK
1 | Freq: Every day | ORAL | 0 refills | Status: DC | PRN
Start: 2023-04-02 — End: 2023-04-02

## 2023-04-02 MED ORDER — ONDANSETRON 4 MG PO TBDI
4 mg | ORAL | 0 refills | Status: DC | PRN
Start: 2023-04-02 — End: 2023-04-02

## 2023-04-02 MED ORDER — MELATONIN 5 MG PO TAB
5 mg | Freq: Every evening | ORAL | 0 refills | Status: DC | PRN
Start: 2023-04-02 — End: 2023-04-02

## 2023-04-02 MED ORDER — SENNOSIDES-DOCUSATE SODIUM 8.6-50 MG PO TAB
1 | Freq: Every day | ORAL | 0 refills | Status: DC | PRN
Start: 2023-04-02 — End: 2023-04-09

## 2023-04-02 MED ORDER — ALBUTEROL SULFATE 90 MCG/ACTUATION IN HFAA
2 | RESPIRATORY_TRACT | 0 refills | Status: DC | PRN
Start: 2023-04-02 — End: 2023-04-09

## 2023-04-02 MED ORDER — CARVEDILOL 3.125 MG PO TAB
3.125 mg | Freq: Two times a day (BID) | ORAL | 0 refills | Status: DC
Start: 2023-04-02 — End: 2023-04-09
  Administered 2023-04-03 – 2023-04-09 (×11): 3.125 mg via ORAL

## 2023-04-02 MED ORDER — ACYCLOVIR 400 MG PO TAB
400 mg | Freq: Two times a day (BID) | ORAL | 0 refills | Status: DC
Start: 2023-04-02 — End: 2023-04-09
  Administered 2023-04-03 – 2023-04-09 (×14): 400 mg via ORAL

## 2023-04-02 MED ORDER — ACETAMINOPHEN 325 MG PO TAB
650 mg | ORAL | 0 refills | Status: DC | PRN
Start: 2023-04-02 — End: 2023-04-09
  Administered 2023-04-05 – 2023-04-06 (×2): 650 mg via ORAL

## 2023-04-02 MED ORDER — BUDESONIDE-FORMOTEROL 160-4.5 MCG/ACTUATION IN HFAA
2 | Freq: Two times a day (BID) | RESPIRATORY_TRACT | 0 refills | Status: DC
Start: 2023-04-02 — End: 2023-04-09
  Administered 2023-04-03: 04:00:00 2 via RESPIRATORY_TRACT

## 2023-04-02 MED ORDER — POLYETHYLENE GLYCOL 3350 17 GRAM PO PWPK
1 | Freq: Every day | ORAL | 0 refills | Status: DC | PRN
Start: 2023-04-02 — End: 2023-04-09

## 2023-04-02 MED ORDER — ONDANSETRON HCL (PF) 4 MG/2 ML IJ SOLN
4 mg | INTRAVENOUS | 0 refills | Status: DC | PRN
Start: 2023-04-02 — End: 2023-04-09
  Administered 2023-04-04 – 2023-04-06 (×4): 4 mg via INTRAVENOUS

## 2023-04-02 MED ORDER — PANTOPRAZOLE 40 MG IV SOLR
40 mg | Freq: Two times a day (BID) | INTRAVENOUS | 0 refills | Status: DC
Start: 2023-04-02 — End: 2023-04-06
  Administered 2023-04-03 – 2023-04-06 (×8): 40 mg via INTRAVENOUS

## 2023-04-02 MED ORDER — DEXTROSE 50 % IN WATER (D50W) IV SYRG
12.5-25 g | INTRAVENOUS | 0 refills | Status: DC | PRN
Start: 2023-04-02 — End: 2023-04-09

## 2023-04-02 MED ORDER — ONDANSETRON HCL (PF) 4 MG/2 ML IJ SOLN
4 mg | INTRAVENOUS | 0 refills | Status: DC | PRN
Start: 2023-04-02 — End: 2023-04-02

## 2023-04-02 MED ORDER — SENNOSIDES-DOCUSATE SODIUM 8.6-50 MG PO TAB
1 | Freq: Every day | ORAL | 0 refills | Status: DC | PRN
Start: 2023-04-02 — End: 2023-04-02

## 2023-04-02 MED ORDER — MELATONIN 5 MG PO TAB
5 mg | Freq: Every evening | ORAL | 0 refills | Status: DC | PRN
Start: 2023-04-02 — End: 2023-04-09
  Administered 2023-04-05 – 2023-04-09 (×5): 5 mg via ORAL

## 2023-04-02 MED ORDER — AMIODARONE 200 MG PO TAB
200 mg | Freq: Every day | ORAL | 0 refills | Status: DC
Start: 2023-04-02 — End: 2023-04-09
  Administered 2023-04-03 – 2023-04-09 (×7): 200 mg via ORAL

## 2023-04-02 MED ORDER — MILRINONE IN 5 % DEXTROSE 20 MG/100 ML (200 MCG/ML) IV PGBK
.3 ug/kg/min | INTRAVENOUS | 0 refills | Status: DC
Start: 2023-04-02 — End: 2023-04-09
  Administered 2023-04-03 – 2023-04-09 (×11): 0.3 ug/kg/min via INTRAVENOUS

## 2023-04-02 NOTE — H&P (View-Only)
Admission History and Physical Assessment         Name:  Crissi Konja                                             MRN:  [id number]   Admission Date:  04/02/2023  Principal Problem:    GI bleed                       Assessment and Plan       Lucendia Waligorski is a 77 y.o. 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     # Acute on chronic symptomatic anemia (MM, CKD)  # Melena  # Hematemesis  # Chronic anticoagulation ?triple therapy  # Hx diverticulosis and sigmoid colon mass resection 07/2022  - 2 episodes of hematemesis morning of 12/6, 1 episode of melena 12/6 in ED  -transfused 2U PRBCs in ED for HGB of 6.9 (7.2 on 12/4)  -she is on [facility] outpatient with H/O  - 12/2 EGD: 1.5 cm pedunculated friable polyp in duodenum s/p clip x2, no bleeding seen.   - 12/6 CT a/p: No acute abdominopelvic inflammatory mass or bowel obstruction     Plan;  - GI consulted  - Monitor HGB closely  - Transfuse as needed        # CAD s/p CABG and PCI  # HFrEF 35%, ICM on milrinone infusion, CRT-D  # Afib   # Carotid artery Disease with CEA 2016  -pt reports being on triple therapy (asa, plavix, eliquis) unsure why this is the case according to previous notes  - Discharged on Eliquis monotherapy however she thinks she had been taking plavix and aspirin as well post 12/4 discharge    Plan:  - Hold all AC for time being  - Continue PTA amiodarone and beta-blocker and milrinone drip     # Leukocytosis   -WBC 15.4 on admit, remains elevated.   -Hx of leukocytosis on previous admission  -Pt is on dex 8mg  qweek prior to chemo.  -Afeb, no infectious sxs.   > Continue to monitor off antibiotics.      # Multiple Myeloma  -[facility]s [facility]  -continue ppx acyclovir  -continue dex (this is weekly)  -on outpatient darzalex     # CKD4, stable  -Cr baseline variable. 1.9-2.3. eGFR ranges 18-25.  Plan:  > Daily lytes/renal fct  > Strict I/os  > CTM closely   > Avoid nephrotoxic agents     # DM2,  - Hold PTA [name] and Levemir  > LDCF  > Accuchek ACHS      FEN: NPO until seen by GI    Prophylaxis Review:  Lines:  PIV  Urinary Catheter:  n/a  VTE: held in setting of GIB    Code status: Full Code    Disposition: Admit to med 1    Bethann Humble, MD  Pager [phone]  ___________________________________________________________________________  Primary Care Physician: Barbaraann Rondo      Chief  Complaint:  hematemesis    History of Present Illness:     Donnita Cardinale is a 77 y.o. female who was recently discharge from [facility] 12/4 after concern for GI bleed and EGD with polypectomy on 12/2. She was restarted on eliquis 12/3. However she appears to have a poor understanding of what medications she was supposed to be taking post discharge. It seems she has resumed all of her medications as she was taking them prior to her last admission including her blood thinners. She thinks she was had been taking her plavix, aspirin as well as her eliquis. Of these she was only supposed to be on eliquis. She reports this morning she suddenly felt nauseated and threw up twice. She described her emesis at mostly blood that was dark brown to red. Does not reports any clots or coffee ground like material in her vomit. While in the ED she was noted to have one bloody bowel movement that was both tarry and have some bright blood in it as well. She is currently stable. Denies chest pain, shortness of breath, pain, dizziness, palpitations. Received 1u pRBC in ED. Discussed that GI will see her shortly.      Past Medical History:  Past Medical History:   Diagnosis Date    Anxiety     Arthritis     Asthma     Asymptomatic stenosis of right carotid artery     Back pain     Cancer of uterus (HCC)     COPD (chronic obstructive pulmonary disease) (HCC)     Coronary artery disease     Diabetes mellitus (HCC)     Dyslipidemia     Flank mass     Fluid retention     Heart disease     Hypertension     Other malignant neoplasm without specification of site     Sleep apnea     Compliant w CPAP    Vision decreased          Past Surgical History:  Surgical History:   Procedure Laterality Date    ROBOT ASSISTED LOW ANTERIOR RESECTION N/A 08/10/2022    Performed by Benetta Spar, MD at [facility] OR    ESOPHAGOGASTRODUODENOSCOPY WITH BIOPSY - FLEXIBLE N/A 09/17/2022    Performed by Tempie Hoist, DO at [facility] ENDO    CATHETERIZATION RIGHT HEART N/A 09/18/2022    Performed by Cath, Physician at [facility] CATH LAB    REMOVAL AND REPLACEMENT IMPLANTABLE DEFIBRILLATOR GENERATOR - SINGLE LEAD SYSTEM Left 11/04/2022    Performed by Kathreen Cornfield, MD at [facility] EP LAB    INSERTION/ REPLACEMENT PERMANENT PACEMAKER WITH ATRIAL LEAD Left 11/04/2022    Performed by Kathreen Cornfield, MD at [facility] EP LAB    Injection Venography Extremity Left 11/04/2022    Performed by Kathreen Cornfield, MD at [facility] EP LAB    ESOPHAGOGASTRODUODENOSCOPY WITH BIOPSY - FLEXIBLE N/A 03/29/2023    Performed by Buckles, Vinnie Level, MD at [facility] ENDO    ESOPHAGOGASTRODUODENOSCOPY WITH SNARE REMOVAL TUMOR/ POLYP/ OTHER LESION - FLEXIBLE N/A 03/29/2023    Performed by Buckles, Vinnie Level, MD at [facility] ENDO    BONE MARROW BIOPSY      CARDIAC DEFIBRILLATOR PLACEMENT      [employer]    CARDIAC SURGERY      CABG 3 V    CAROTID ENDARDECTOMY Right     COLONOSCOPY      HX CORONARY STENT PLACEMENT  HX HEART CATHETERIZATION      HX HYSTERECTOMY      TUNNELED VENOUS PORT PLACEMENT Right     Chest         Home Medications:  No current facility-administered medications on file prior to encounter.     Current Outpatient Medications on File Prior to Encounter   Medication Sig Dispense Refill    acetaminophen (TYLENOL) 325 mg tablet Take three tablets by mouth every 8 hours as needed for Pain. Indications: pain 40 tablet 0    acyclovir (ZOVIRAX) 400 mg tablet Take one tablet by mouth twice daily.      albuterol sulfate (PROAIR HFA) 90 mcg/actuation HFA aerosol inhaler Inhale two puffs by mouth into the lungs every 6 hours as needed for Wheezing or Shortness of Breath.      amiodarone (CORDARONE) 200 mg tablet Take one tablet by mouth daily. Indications: prevention of recurrent atrial fibrillation 90 tablet 1    apixaban (ELIQUIS) 5 mg tablet Take one tablet by mouth twice daily. Indications: afib 60 tablet 0    budesonide-formoterol HFA (SYMBICORT) 160-4.5 mcg/actuation aerosol inhaler Inhale two puffs by mouth into the lungs twice daily. Indications: bronchospasm prevention with COPD 30.6 g 0    carvediloL (COREG) 3.125 mg tablet Take one tablet by mouth twice daily with meals. Take with food.  Indications: heart failute with rEF 180 tablet 0    cholecalciferol (VITAMIN D) 1,000 units tablet Take one tablet by mouth daily.      cyanocobalamin (VITAMIN B-12) 1,000 mcg tablet Take 5,000 Units by mouth daily.      dexAMETHasone (DECADRON) 4 mg tablet Take two tablets by mouth every 7 days. Patient is taking 8mg  on Thursdays prior to chemo      dicyclomine (BENTYL) 10 mg capsule Take one capsule by mouth daily.      empagliflozin ([name]) 25 mg tablet Take one tablet by mouth daily.      EMU OIL (BULK) MISC Use  as directed. Apply topically as needed      febuxostat (ULORIC) 40 mg tablet Take one-half tablet by mouth daily. Indications: taken on chemo days 45 tablet 0    ferrous sulfate (FEOSOL) 325 mg (65 mg iron) tablet Take one tablet by mouth daily. Take on an empty stomach at least 1 hour before or 2 hours after food.      insulin detemir U-100 (LEVEMIR FLEXTOUCH) 100 unit/mL (3 mL) injection pen Resume after speaking with your PCP or endocrinology Indications: type 2 diabetes mellitus      milrinone (PRIMACOR) 200 mcg/mL infusion Indications: sudden and serious symptoms of heart failure called acute decompensated heart failure      nitroglycerin (NITROSTAT) 0.4 mg tablet Place one tablet under tongue every 5 minutes as needed for Chest Pain.      pantoprazole DR (PROTONIX) 40 mg tablet Take one tablet by mouth daily. Indications: gastritis 90 tablet 1    potassium chloride SR (K-DUR) 20 mEq tablet Resume after cardiology follow up  Indications: prevention of low potassium in the blood      rosuvastatin (CRESTOR) 20 mg tablet Take one tablet by mouth daily.      sertraline (ZOLOFT) 50 mg tablet Take one tablet by mouth daily.      torsemide (DEMADEX) 20 mg tablet Resume after cardiology follow up  Indications: accumulation of fluid resulting from chronic heart failure           Allergies:  Bortezomib, Allopurinol, and Sulfa (sulfonamide antibiotics)  Social History:  Social History     Socioeconomic History    Marital status: Widowed   Tobacco Use    Smoking status: Former     Current packs/day: 0.00     Average packs/day: 1 pack/day for 20.0 years (20.0 ttl pk-yrs)     Types: Cigarettes     Start date: 52     Quit date: 2010     Years since quitting: 14.9     Passive exposure: Never    Smokeless tobacco: Never   Vaping Use    Vaping status: Never Used   Substance and Sexual Activity    Alcohol use: Not Currently    Drug use: Not Currently         Family History:  Family History   Problem Relation Name Age of Onset    Cancer Mother      Diabetes Mother      Hypertension Mother      Cancer-Breast Sister      Cancer-Ovarian Sister      Cancer Sister      Diabetes Sister      Heart Disease Sister      Hypertension Sister      Heart Disease Brother      Hypertension Brother      Stroke Brother           Immunizations:   Immunization History   Administered Date(s) Administered    COVID-19 (MODERNA), mRNA vacc, 100 mcg/0.5 mL (PF) 06/28/2019, 07/26/2019, 02/26/2020             Review of Systems:    {Review of Systems   Constitutional:  Positive for chills and malaise/fatigue. Negative for fever and weight loss.   HENT: Negative.     Eyes: Negative.    Respiratory: Negative.     Cardiovascular: Negative.    Gastrointestinal:  Positive for blood in stool, constipation, melena, nausea and vomiting. Negative for abdominal pain and diarrhea.   Genitourinary: Negative.    Skin: Negative.            PHYSICAL EXAM:    Vital Signs: Last Filed In 24 Hours   BP: 106/55 (12/06 1545)  Temp: 37.6 ?C (99.7 ?F) (12/06 1509)  Pulse: 94 (12/06 1545)  Respirations: 14 PER MINUTE (12/06 1545)  SpO2: 97 % (12/06 1545)     {Physical Exam  Constitutional:       Appearance: She is obese.   HENT:      Head: Normocephalic.      Mouth/Throat:      Pharynx: Oropharynx is clear.   Eyes:      Extraocular Movements: Extraocular movements intact.      Conjunctiva/sclera: Conjunctivae normal.   Cardiovascular:      Rate and Rhythm: Normal rate.      Pulses: Normal pulses.      Heart sounds: Normal heart sounds.   Pulmonary:      Effort: Pulmonary effort is normal. No respiratory distress.      Breath sounds: Normal breath sounds. No stridor. No wheezing, rhonchi or rales.   Chest:      Chest wall: No tenderness.   Abdominal:      General: Abdomen is flat. Bowel sounds are normal. There is no distension.      Palpations: Abdomen is soft.      Tenderness: There is no abdominal tenderness. There is no guarding.   Skin:     General: Skin is warm and dry.  Neurological:      General: No focal deficit present.      Mental Status: She is alert and oriented to person, place, and time. Mental status is at baseline.   Psychiatric:         Mood and Affect: Mood normal.           Labs and Data:  24-hour labs:    Results for orders placed or performed during the hospital encounter of 04/02/23 (from the past 24 hours)   CBC    Collection Time: 04/02/23 10:26 AM   Result Value Ref Range    White Blood Cells 15.40 (H) 4.50 - 11.00 10*3/uL    Red Blood Cells 2.40 (L) 4.00 - 5.00 10*6/uL    Hemoglobin 6.9 (L) 12.0 - 15.0 g/dL    Hematocrit 09.8 (L) 36.0 - 45.0 %    MCV 90.0 80.0 - 100.0 fL    MCH 28.9 26.0 - 34.0 pg    MCHC 32.1 32.0 - 36.0 g/dL    RDW 11.9 (H) 14.7 - 15.0 %    Platelet Count 261 150 - 400 10*3/uL    MPV 8.2 7.0 - 11.0 fL   COMPREHENSIVE METABOLIC PANEL    Collection Time: 04/02/23 10:26 AM   Result Value Ref Range    Sodium 142 137 - 147 mmol/L    Potassium 4.6 3.5 - 5.1 mmol/L    Chloride 108 98 - 110 mmol/L    Glucose 127 (H) 70 - 100 mg/dL    Blood Urea Nitrogen 60 (H) 7 - 25 mg/dL    Creatinine 8.29 (H) 0.40 - 1.00 mg/dL    Calcium 8.5 8.5 - 56.2 mg/dL    Total Protein 5.3 (L) 6.0 - 8.0 g/dL    Total Bilirubin 0.8 0.2 - 1.3 mg/dL    Albumin 3.3 (L) 3.5 - 5.0 g/dL    Alk Phosphatase 58 25 - 110 U/L    AST 14 7 - 40 U/L    ALT 10 7 - 56 U/L    CO2 21 21 - 30 mmol/L    Anion Gap 13 (H) 3 - 12    Glomerular Filtration Rate (GFR) 19 (L) >60 mL/min   COVID INFLUENZA A/B AND RSV PCR    Collection Time: 04/02/23 10:26 AM    Specimen: Nasopharyngeal; Swab   Result Value Ref Range    Influenza A Virus Not Detected Not Detected, Test Invalid    Influenza B Virus Not Detected Not Detected, Test Invalid    RSV PCR Not Detected Not Detected, Test Invalid    COVID-19 (SARS-CoV-2) PCR Not Detected Not Detected   URINALYSIS DIPSTICK REFLEX TO CULTURE    Collection Time: 04/02/23 11:12 AM    Specimen: Midstream; Urine   Result Value Ref Range    Color,UA Straw     Turbidity,UA Clear Clear    Specific Gravity-Urine 1.007 1.005 - 1.030    pH,UA 6.0 5.0 - 8.0    Protein,UA Negative Negative    Glucose,UA 3+ (A) Negative    Ketones,UA Negative Negative    Bilirubin,UA Negative Negative    Blood,UA Negative Negative    Urobilinogen,UA Normal Normal    Nitrite,UA Negative Negative    Leukocytes,UA Negative Negative   URINALYSIS MICROSCOPIC REFLEX TO CULTURE    Collection Time: 04/02/23 11:12 AM    Specimen: Midstream; Urine Result Value Ref Range    WBCs,UA 0 - 2 None, 0 - 2  /HPF    RBCs,UA 0 -  2 None, 0 - 2  /HPF    Mucous,UA Trace None, Trace /LPF    Squamous Epithelial Cells 0 - 2 None, 0 - 2 , 2 - 5 /HPF   TYPE & CROSSMATCH    Collection Time: 04/02/23 11:37 AM   Result Value Ref Range    ABO/RH(D) B POS     Antibody Screen NEG     Crossmatch Expires 04/05/2023,2359     Units Ordered 2     Electronic Crossmatch YES     Record Check FOUND    PREPARE RBC - SUNQUEST    Collection Time: 04/02/23 11:37 AM   Result Value Ref Range    Units Ordered 2     Crossmatch Expires 04/05/2023,2359     Record Check FOUND     ABO/RH(D) B POS     Antibody Screen NEG     Electronic Crossmatch YES     Unit Number [id number]     Blood Component Type RBC,ADSOL,LEUKO REDUCED,IRRADIATED     Unit Division 00     Status OF Unit ISSUED     ISSUE DATE TIME [id number]     PRODUCT CODE [id number]     BLOOD TYPE B POS     CODING STATUS 7300     BLOOD EXPIRATION DATE [id number]     Transfusion Status OK TO TRANSFUSE     Crossmatch Result COMPATIBLE,ELECTRONIC      Glucose: (!) 127 (04/02/23 1026)    CT ABD/PELV WO CONTRAST    Result Date: 04/02/2023  1. No acute abdominopelvic inflammatory mass or bowel obstruction. 2. Distal colonic anastomosis. Mild distal colonic diverticulosis. 3. Mild increase in size of indeterminate large lobulated cystic mass in the tail of pancreas. Endoscopic ultrasound recommended for further evaluation. 4. Partially visualized mild cardiomegaly. Mild interlobular septal thickening with trace right pleural effusion suggesting CHF/fluid overload. Tree-in-bud and multiple subcentimeter nodular opacities are indeterminate and may be from edema, atypical infection, and/or aspiration. Short follow-up nonemergent CT chest in 6-8 weeks recommended for reevaluation.  Finalized by Ancil Boozer, M.D. on 04/02/2023 12:59 PM. Dictated by Ancil Boozer, M.D. on 04/02/2023 12:42 PM.      Pertinent radiology reviewed.

## 2023-04-02 NOTE — ED Provider Notes
Raven [name] is a 77 y.o. female.    Chief Complaint:  Chief Complaint   Patient presents with    Blood in Vomit     Pt presents to the Ed for blood in vommit that started last night. Pt discharged on wed. Pt denies blood in stools or dizziness. Pt feels weak. Pt on milrinone infusion.        History of Present Illness:  Raven [name] is a 77 y.o. female with a history of HFrEF, ischemic cardiomyopathy, CADwith CABG,  PCI, severe MR, ICD in situ, carotid artery disease with prior endarterectomy, CKD 3, multiple myeloma, endometrial cancer s/p hysterectomy, diverticulosis s/p sigmoid colon mass resection on 08/10/2022 who presents to the ED for hematemesis. She was admitted here from 11/27-12/4 for a GI bleed with resultant low Hgb. During this admission she had an EGD on 12/2 wherein they discovered a duodenal polyp which was clipped without obvious bleeding. They started her on Eliquis on 12/3 and her Hgb stabilized prior to discharge. She presents today endorsing chills last night then abdominal pain and two episodes of hematemesis this morning. She also passed a dark stool on arrival, but denies any prior to arrival.      History provided by:  Patient  Language interpreter used: No        Review of Systems:  Review of Systems   Constitutional:  Positive for chills.   Respiratory: Negative.     Cardiovascular: Negative.    Gastrointestinal:  Positive for blood in stool and vomiting.   Neurological: Negative.    All other systems reviewed and are negative.      Allergies:  Bortezomib, Allopurinol, and Sulfa (sulfonamide antibiotics)    Past Medical History:  Past Medical History:   Diagnosis Date    Anxiety     Arthritis     Asthma     Asymptomatic stenosis of right carotid artery     Back pain     Cancer of uterus (HCC)     COPD (chronic obstructive pulmonary disease) (HCC)     Coronary artery disease     Diabetes mellitus (HCC)     Dyslipidemia     Flank mass     Fluid retention     Heart disease     Hypertension     Other malignant neoplasm without specification of site     Sleep apnea     Compliant w CPAP    Vision decreased        Past Surgical History:  Surgical History:   Procedure Laterality Date    ROBOT ASSISTED LOW ANTERIOR RESECTION N/A 08/10/2022    Performed by Benetta Spar, MD at [facility] OR    ESOPHAGOGASTRODUODENOSCOPY WITH BIOPSY - FLEXIBLE N/A 09/17/2022    Performed by Tempie Hoist, DO at [facility] ENDO    CATHETERIZATION RIGHT HEART N/A 09/18/2022    Performed by Cath, Physician at [facility] CATH LAB    REMOVAL AND REPLACEMENT IMPLANTABLE DEFIBRILLATOR GENERATOR - SINGLE LEAD SYSTEM Left 11/04/2022    Performed by Kathreen Cornfield, MD at [facility] EP LAB    INSERTION/ REPLACEMENT PERMANENT PACEMAKER WITH ATRIAL LEAD Left 11/04/2022    Performed by Kathreen Cornfield, MD at [facility] EP LAB    Injection Venography Extremity Left 11/04/2022    Performed by Kathreen Cornfield, MD at [facility] EP LAB    ESOPHAGOGASTRODUODENOSCOPY WITH BIOPSY - FLEXIBLE N/A 03/29/2023    Performed by Buckles, Vinnie Level, MD at  [facility] ENDO    ESOPHAGOGASTRODUODENOSCOPY WITH SNARE REMOVAL TUMOR/ POLYP/ OTHER LESION - FLEXIBLE N/A 03/29/2023    Performed by Buckles, Vinnie Level, MD at [facility] ENDO    BONE MARROW BIOPSY      CARDIAC DEFIBRILLATOR PLACEMENT      [employer]    CARDIAC SURGERY      CABG 3 V    CAROTID ENDARDECTOMY Right     COLONOSCOPY      HX CORONARY STENT PLACEMENT      HX HEART CATHETERIZATION      HX HYSTERECTOMY      TUNNELED VENOUS PORT PLACEMENT Right     Chest       Pertinent medical/surgical history reviewed  Past Medical History:   Diagnosis Date    Anxiety     Arthritis     Asthma     Asymptomatic stenosis of right carotid artery     Back pain     Cancer of uterus (HCC)     COPD (chronic obstructive pulmonary disease) (HCC)     Coronary artery disease     Diabetes mellitus (HCC)     Dyslipidemia     Flank mass     Fluid retention     Heart disease     Hypertension     Other malignant neoplasm without specification of site Sleep apnea     Compliant w CPAP    Vision decreased      Surgical History:   Procedure Laterality Date    ROBOT ASSISTED LOW ANTERIOR RESECTION N/A 08/10/2022    Performed by Benetta Spar, MD at [facility] OR    ESOPHAGOGASTRODUODENOSCOPY WITH BIOPSY - FLEXIBLE N/A 09/17/2022    Performed by Tempie Hoist, DO at [facility] ENDO    CATHETERIZATION RIGHT HEART N/A 09/18/2022    Performed by Cath, Physician at [facility] CATH LAB    REMOVAL AND REPLACEMENT IMPLANTABLE DEFIBRILLATOR GENERATOR - SINGLE LEAD SYSTEM Left 11/04/2022    Performed by Kathreen Cornfield, MD at [facility] EP LAB    INSERTION/ REPLACEMENT PERMANENT PACEMAKER WITH ATRIAL LEAD Left 11/04/2022    Performed by Kathreen Cornfield, MD at [facility] EP LAB    Injection Venography Extremity Left 11/04/2022    Performed by Kathreen Cornfield, MD at [facility] EP LAB    ESOPHAGOGASTRODUODENOSCOPY WITH BIOPSY - FLEXIBLE N/A 03/29/2023    Performed by Buckles, Vinnie Level, MD at [facility] ENDO    ESOPHAGOGASTRODUODENOSCOPY WITH SNARE REMOVAL TUMOR/ POLYP/ OTHER LESION - FLEXIBLE N/A 03/29/2023    Performed by Buckles, Vinnie Level, MD at [facility] ENDO    BONE MARROW BIOPSY      CARDIAC DEFIBRILLATOR PLACEMENT      [employer]    CARDIAC SURGERY      CABG 3 V    CAROTID ENDARDECTOMY Right     COLONOSCOPY      HX CORONARY STENT PLACEMENT      HX HEART CATHETERIZATION      HX HYSTERECTOMY      TUNNELED VENOUS PORT PLACEMENT Right     Chest       Social History:  Social History     Tobacco Use    Smoking status: Former     Current packs/day: 0.00     Average packs/day: 1 pack/day for 20.0 years (20.0 ttl pk-yrs)     Types: Cigarettes     Start date: 58     Quit date: 2010     Years since quitting: 14.9  Passive exposure: Never    Smokeless tobacco: Never   Vaping Use    Vaping status: Never Used   Substance Use Topics    Alcohol use: Not Currently    Drug use: Not Currently     Social History     Substance and Sexual Activity   Drug Use Not Currently             Family History:  Family History   Problem Relation Name Age of Onset    Cancer Mother      Diabetes Mother      Hypertension Mother      Cancer-Breast Sister      Cancer-Ovarian Sister      Cancer Sister      Diabetes Sister      Heart Disease Sister      Hypertension Sister      Heart Disease Brother      Hypertension Brother      Stroke Brother         Vitals:  ED Vitals      Date and Time T BP P RR SPO2P SPO2 User   04/02/23 1024 36.5 ?C (97.7 ?F) 97/66 90 16 PER MINUTE -- 99 % MT            Physical Exam:  Physical Exam  Vitals and nursing note reviewed.   Constitutional:       General: She is not in acute distress.     Appearance: Normal appearance. She is normal weight. She is not ill-appearing.   HENT:      Head: Normocephalic and atraumatic.      Nose: Nose normal.      Mouth/Throat:      Mouth: Mucous membranes are moist.   Eyes:      Extraocular Movements: Extraocular movements intact.   Cardiovascular:      Rate and Rhythm: Normal rate.   Pulmonary:      Effort: Pulmonary effort is normal.   Abdominal:      General: There is no distension.      Palpations: Abdomen is soft.      Tenderness: There is no abdominal tenderness.   Musculoskeletal:         General: Normal range of motion.      Cervical back: Normal range of motion.   Skin:     General: Skin is warm and dry.      Coloration: Skin is pale.   Neurological:      General: No focal deficit present.      Mental Status: She is alert and oriented to person, place, and time.   Psychiatric:         Mood and Affect: Mood normal.         Behavior: Behavior normal.         Laboratory Results:  Labs Reviewed   CBC - Abnormal       Result Value Ref Range Status    White Blood Cells 15.40 (*) 4.50 - 11.00 10*3/uL Final    Red Blood Cells 2.40 (*) 4.00 - 5.00 10*6/uL Final    Hemoglobin 6.9 (*) 12.0 - 15.0 g/dL Final    Hematocrit 96.2 (*) 36.0 - 45.0 % Final    MCV 90.0  80.0 - 100.0 fL Final    MCH 28.9  26.0 - 34.0 pg Final    MCHC 32.1  32.0 - 36.0 g/dL Final    RDW 95.2 (*) 84.1 - 15.0 % Final  Platelet Count 261 150 - 400 10*3/uL Final    MPV 8.2  7.0 - 11.0 fL Final   COVID INFLUENZA A/B AND RSV PCR   COMPREHENSIVE METABOLIC PANEL   URINALYSIS, COMPLETE W/REFLEX TO CULTURE    Narrative:     The following orders were created for panel order URINALYSIS, COMPLETE W/REFLEX TO CULTURE.  Procedure                               Abnormality         Status                     ---------                               -----------         ------                     URINALYSIS DIPSTICK REF.Marland KitchenMarland Kitchen[[id number]]                                                 URINALYSIS MICROSCOPIC .Marland Kitchen.[[id number]]                                                 EXTRA CLEAR URINE IHKV[[id number]]                                                     EXTRA URINE GRAY IEP[[id number]]                                                         Please view results for these tests on the individual orders.   URINALYSIS DIPSTICK REFLEX TO CULTURE   URINALYSIS MICROSCOPIC REFLEX TO CULTURE   EXTRA CLEAR URINE TUBE   EXTRA URINE GRAY TOP          Radiology Interpretation:  No orders to display       EKG:      Medical Decision Making:  Todd Gambel is a 77 y.o. female who presents with chief complaint as listed above. Based on the history and presentation, the list of differential diagnoses considered included, but was not limited to, gi bleed, gastroenteritis, surgical complication    ED Course  Patient evaluated and treated, transfused. Admitted with stable vitals.       Complexity of Problems Addressed  Patient's active diagnoses as well as contributing pre-existing medical problems include:  Clinical Impression   None          Additional data reviewed:    History was obtained from an independent historian: Not in addition to what is mentioned above  Prior non-ED notes reviewed: Not in addition to what is mentioned above  Independent interpretation of diagnostic tests  was performed by me: Not in addition to what is mentioned above  Patient presentation/management was discussed with the following qualified health care professionals and/or other relevant professionals: Not in addition to what is mentioned above    Risk evaluation:    Diagnosis or treatment of patient condition impacted by social determinant of health: None  Tests Considered but not performed due to clinical scoring (if not mentioned in ED course, aside from what is implied by clinical scores listed):   Rationale regarding whether admission or escalation of care considered if not performed (if not mentioned in ED course, aside from what is implied by clinical scores listed):     ED Scoring:                                Facility Administered Meds:  Medications - No data to display    Clinical Impression:  Clinical Impression   None       Disposition/Follow up  ED Disposition     None        No follow-up provider specified.    Medications:  New Prescriptions    No medications on file       Procedure Notes:  Procedures       Attestation / Supervision:  I, Myrtie Hawk, am scribing for and in the presence of [place], DO.      Adam Kopitke        Note has been documented by Myrtie Hawk on 04/02/2023  Attestation / Supervision Note concerning Raven [name]: I personally performed the E/M including history, physical exam, and MDM.    Lorella Nimrod, DO

## 2023-04-02 NOTE — Progress Notes
Provider Carley Hammed notified of patient's home Milrinone infusion being out. Orders requested for hospital dosing and new infusion for patient. Provider to place orders. Provider also requesting telemetry for patient. Patient placement notified.

## 2023-04-02 NOTE — ED Notes
77 y.o. Raven [name] presents to ED08 with a cc of vomiting. Pt reports vomiting started today; describes vomit as bloody. Pt denies abdominal pain, blood in stool, CP, dizziness/lightheadedness and fevers. Pt was recently dc from [facility] on Wednesday for low hemoglobins + EGD. Pt A&Ox4, VSS, Skin warm, dry, and race appropriate. Pt placed on BP, cardiac, and O2 monitors. Pt resting on cart, wheels locked, in lowest locked position. Call light and belongings within reach. Awaiting provider evaluation.     Pt assumes responsibility for all belongings at this time.      Past Medical History:   Diagnosis Date    Anxiety     Arthritis     Asthma     Asymptomatic stenosis of right carotid artery     Back pain     Cancer of uterus (HCC)     COPD (chronic obstructive pulmonary disease) (HCC)     Coronary artery disease     Diabetes mellitus (HCC)     Dyslipidemia     Flank mass     Fluid retention     Heart disease     Hypertension     Other malignant neoplasm without specification of site     Sleep apnea     Compliant w CPAP    Vision decreased

## 2023-04-03 NOTE — Progress Notes
[facility] Infusion Note     Home Infusion Transfer Summary - to Receiving Agency: [facility]  Learned on 04/02/23 that patient was hospitalized on 04/02/23 for GI bleed.      [facility] has been providing the following treatment to the patient:   Drug: milrinone 0.50mcg/kg/min at 78.3kg IV via continuous infusion         Administration method:  CADD [employer] ambulatory pump   IV Access: SL Port           last dressing change: n/a   Labs last drawn:  prior to discharge on 03/31/23    Nursing provided by: [facility] ph: [phone]     Last visit:  n/a    Progress toward goals: patient recently discharged from [facility] on 03/31/23 with the same dose of milrinone.  On this admission, it was discovered that patient was using a milrinone bag with an expiration date of 03/28/23 that was likely left over from her previous hospitalization.  KUHI will reinforce rotating bags and discarding expired bags with patient.   Community Resources: none    When the patient is prepared to return home, Emogene Morgan will be able to resume care once we receive physician orders specific to the care that is to resume.      Submitted by:  Jari Sportsman, PharmD   Date/time: 04/02/23 at 19:00

## 2023-04-03 NOTE — Case Management (ED)
Case Management 30 Day Readmission Assessment      NAME: Raven [name]                    MRN: [id number]              DOB: [birth year]          AGE: 77 y.o.  ADMISSION DATE: 04/02/2023             DAYS ADMITTED: LOS: 1 day    Today's Date: 04/03/2023    Source of Information: Patient and EMR    Expected Discharge Date:  04/06/2023     77 y.o. 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        Plan:  Patient previously discharged on 03/31/23 with milrinone gtt through [facility] and [facility] for SN/PT/OT services.   PT/OT consulted -- awaiting recommendations.  GI consulted -- awaiting recommendations.   Primary CM team to continue to follow for discharge planning.     Previous Inpatient Discharge Date:    03/31/23    Previous Case Management admission assessment dated 03/29/23 is copied at the end of this note for reference and was reviewed with patient/family.  Changes to information from previous admission assessment are: none.     Acute Hospital Stay:  Acute Hospital Stay: Yes  Was patient's stay within the last 30 days?: Yes  Name of Hospital: [facility]  When did patient receive care?: 03/24/23-03/31/23  Readmission Code Group: 7. Advancement of Disease / Chronic Illness Management  7. Advancement of Disease / Chronic Illness Management: 7a10. GI    Prior Living Situation/Admitted from?   Type of Residence: Home, independent    Post-Acute Services/DME Last Discharge:  Were post-acute services/DME arranged at previous disharge?: Yes  Which services/DME were arranged at previous discharge?: Home health, Home infusion-enteral-TPN  Does patient/family feel services/DME were adequate?: Yes    Lack of services:  At or after previous discharge, what did the patient/family feel they lacked?: Patient/family felt discharge services were adequate    Previous Discharge Resources Provided:  Were resources provided to patient at previous discharge in person or within AVS?: No    Social Determinants of Health (SDOH):  Within the past 12 months we worried whether our food would run out before we got money to buy more.: Never True        In the past 12 months, have you ever gone without  health care because you didn't have a way to get there?: (!) Yes     In the last 12 months, has your utility company shut off your service for not paying your bills?: (!) Yes  Are you worried that in the next 2 months, you may not have stable housing?: (!) Yes    Transportation:  Does the Patient Need Case Management to Arrange Discharge Transport? (ex: facility, ambulance, wheelchair/stretcher, Medicaid, cab, other): No  Will the Patient Use Family Transport?: Yes  Transportation Name, Phone and Availability #1: [name] (son) [phone]    Case Management Admission Assessment     NAME:Raven [name]  MRN: [id number]             DOB:08/07/1945          AGE: 77 y.o.  ADMISSION DATE: 03/24/2023             DAYS ADMITTED: LOS: 5 days      Today?s Date: 03/29/2023     Source of Information: Patient        Plan  Plan: Case Management Assessment, Assist PRN with SW/NCM Services     EMR reviewed. Discussed patient with SW and team.   77 y.o. 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      This CM met with pt for assessment on this date.  Provided contact information and explanation of SW/NCM roles.  Reviewed  [facility] hand-outs.  Provided opportunity for questions and discussion. Pt/family encouraged to contact Case Management team with questions and concerns during hospitalization and until patient is able to transition back to the patient's primary care physician.     Patient lives with son, daughter-in-law and grand daughter. Son does not work and is able to assist if needed.  Patient is independent with ADLs.  DME: CPAP machine, rollator, scooter.   Patient is active with [facility]. Referral sent and notified agency of admission.   Denies history of SNF, IPR, LTACH, or outpatient therapy or infusion.  PCP: Barbaraann Rondo.         Patient Address/Phone  [address]  [place] [address]  [phone] (home)      Emergency Contact  Extended Emergency Contact Information  Primary Emergency Contact: Dicioccio,CLIFTON  Home Phone: [phone]  Mobile Phone: [phone]  Relation: Son  Secondary Emergency Contact: Londo,Rosie  Mobile Phone: [phone]  Relation: Relative  Interpreter needed? No     Development worker, international aid  Will the Patient Use Family Transport?: Yes  Transportation Name, Phone and Availability #1: Bertis Ruddy [phone]     Expected Discharge Date  03/30/2023      Living Situation Prior to Admission  Living Arrangements  Type of Residence: Home, independent  Living Arrangements: Children, Family members  How many levels in the residence?: 2  Can patient live on one level if needed?: No  Does residence have entry and/or inside stairs?: Yes (1 step into home from garage. 2 steps into home from front door. Flight up to bedrooms, but their is a stair lift.)  Level of Function   Prior level of function: Independent  Cognitive Abilities   Cognitive Abilities: Alert and Oriented, Engages in problem solving and planning, Participates in decision making, Recognizes impact of health condition on lifestyle     Financial Resources  Coverage  Primary Insurance: Medicare Replacement [facility] Medicare)  Medication Coverage    Medication Coverage: Medicare Part D  Medicare Part D Plan: [employer]  Have you experienced a noticeable increase in your copay costs recently?: Yes  Are current medications affordable?:  (Patient reports Jaudiance and Eliquis are expensive.)  Are medications being skipped or not taken as prescribed?: No  Do You Use a Co-Pay Card or a Medication Assistance Program to Help  Manage Medication Costs?: No  Do You Manage Your Own Medications?: Yes     Provided patient with options to assist with co-pays.      Source of Income   Source Of Income: SSI  Financial Assistance Needed?        Psychosocial Needs  Mental Health  Mental Health History: No  Substance Use History  Substance Use History Screen: No  Other        Current/Previous Services  PCP  Barbaraann Rondo, [phone], [phone]  Pharmacy     [facility] [address] [place] - [address]  [address]  [place] [address]  Phone: [phone] Fax: [phone]     Durable Medical Equipment   Durable Medical Equipment at home: CPAP/BiPAP, Rollator, Other (comment) Printmaker.)  Home Health  Receiving home health: Yes  Agency name: [facility]  Would patient use this agency again?: Yes  Hemodialysis or Peritoneal Dialysis  Tube/Enteral Feeds  Receive tube/enteral feeds: No  Infusion  Receive infusions: No  Private Duty  Private duty help used: No  Home and Community Based Services  Home and community based services: No  [employer]  Hospice  Outpatient Therapy  PT: No  OT: No  SLP: No  Skilled Nursing Facility/Nursing Home  SNF: No  NH: No  Inpatient Rehab  IPR: No  Long-Term Acute Care Hospital  LTACH: No  Acute Hospital Stay  Acute Hospital Stay: In the past  Was patient's stay within the last 30 days?: No        Mertie Moores, BSN, RN  Nurse Case Manager  Available on [place]

## 2023-04-03 NOTE — Consults
Gastroenterology Consult Note  Patient Name:Raven [name]           ZOX:[id number]  Admission Date: 04/02/2023 10:14 AM      Principal Problem:    GI bleed      History of Present Illness/Subjective:  Raven [name] is a 77 y.o. 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     Recent admission for melena, a friable large duodenal polyp presumed to be source for GIB was resected by hot snare and 2 endoclips placed. Patient resumed ASA, Plavix and Eliquis post procedurally at discharge despite being informed to hold asa and plavix and is now presenting with recurrent melena, hematemesis and found to have mild decrease in Hgb from baseline and elevated BUN/Cr ratio. Hematemesis resolve since admission but continues to have melena this morning    One episode of hematochezia and melena in the ER reportedly.      Assessment/ Plan:  - Upper GI bleed, likely post polypectomy bleed in the setting of AC use  - Hematemesis and elevated BUN/Cr ratio supports that too      Recommendations:  - EGD Monday if patient remains hemodynamically stable over the weekend  - If hemodynamic instability, recurrent melena overnight or significant drop in Hgb again tomorrow, can plan for EGD tomorrow  - IV PPI bid, transfuse to goal Hgb >7  - Hold anticoagulation for now  - Clear liquids for now    Patient seen/discussed with Dr. Janene Madeira      Dellis Filbert, MBBS  Gastroenterology and Hepatology    -----------------------------  PMH:  Past Medical History:   Diagnosis Date    Anxiety     Arthritis     Asthma     Asymptomatic stenosis of right carotid artery     Back pain     Cancer of uterus (HCC)     COPD (chronic obstructive pulmonary disease) (HCC)     Coronary artery disease     Diabetes mellitus (HCC)     Dyslipidemia     Flank mass     Fluid retention     Heart disease     Hypertension     Other malignant neoplasm without specification of site     Sleep apnea     Compliant w CPAP    Vision decreased        Current medications:  No current facility-administered medications on file prior to encounter.     Current Outpatient Medications on File Prior to Encounter   Medication Sig Dispense Refill    acetaminophen (TYLENOL) 325 mg tablet Take three tablets by mouth every 8 hours as needed for Pain. Indications: pain 40 tablet 0    acyclovir (ZOVIRAX) 400 mg tablet Take one tablet by mouth twice daily.      albuterol sulfate (PROAIR HFA) 90 mcg/actuation HFA aerosol inhaler Inhale two puffs by mouth into the lungs every 6 hours as needed for Wheezing or Shortness of Breath.      amiodarone (CORDARONE) 200 mg tablet Take one tablet by mouth daily. Indications: prevention of recurrent atrial fibrillation 90 tablet 1    apixaban (ELIQUIS) 5 mg tablet  Take one tablet by mouth twice daily. Indications: afib 60 tablet 0    budesonide-formoterol HFA (SYMBICORT) 160-4.5 mcg/actuation aerosol inhaler Inhale two puffs by mouth into the lungs twice daily. Indications: bronchospasm prevention with COPD 30.6 g 0    carvediloL (COREG) 3.125 mg tablet Take one tablet by mouth twice daily with meals. Take with food.  Indications: heart failute with rEF 180 tablet 0    cholecalciferol (VITAMIN D) 1,000 units tablet Take one tablet by mouth daily.      cyanocobalamin (VITAMIN B-12) 1,000 mcg tablet Take 5,000 Units by mouth daily.      dexAMETHasone (DECADRON) 4 mg tablet Take two tablets by mouth every 7 days. Patient is taking 8mg  on Thursdays prior to chemo      dicyclomine (BENTYL) 10 mg capsule Take one capsule by mouth daily.      empagliflozin ([name]) 25 mg tablet Take one tablet by mouth daily.      EMU OIL (BULK) MISC Use  as directed. Apply topically as needed      febuxostat (ULORIC) 40 mg tablet Take one-half tablet by mouth daily. Indications: taken on chemo days 45 tablet 0    ferrous sulfate (FEOSOL) 325 mg (65 mg iron) tablet Take one tablet by mouth daily. Take on an empty stomach at least 1 hour before or 2 hours after food.      insulin detemir U-100 (LEVEMIR FLEXTOUCH) 100 unit/mL (3 mL) injection pen Resume after speaking with your PCP or endocrinology  Indications: type 2 diabetes mellitus      milrinone lactate/D5W (MILRINONE IN D5W IV) Administer  through vein. Milrinone 80mg /216ml  0.59mcg/kg/min intravenously continuous infusion. Dosing wt 78.3 kg      nitroglycerin (NITROSTAT) 0.4 mg tablet Place one tablet under tongue every 5 minutes as needed for Chest Pain.      pantoprazole DR (PROTONIX) 40 mg tablet Take one tablet by mouth daily. Indications: gastritis 90 tablet 1    potassium chloride SR (K-DUR) 20 mEq tablet Resume after cardiology follow up  Indications: prevention of low potassium in the blood      rosuvastatin (CRESTOR) 20 mg tablet Take one tablet by mouth daily.      sertraline (ZOLOFT) 50 mg tablet Take one tablet by mouth daily.      torsemide (DEMADEX) 20 mg tablet Resume after cardiology follow up  Indications: accumulation of fluid resulting from chronic heart failure         PSH:  Surgical History:   Procedure Laterality Date    ROBOT ASSISTED LOW ANTERIOR RESECTION N/A 08/10/2022    Performed by Benetta Spar, MD at [facility] OR    ESOPHAGOGASTRODUODENOSCOPY WITH BIOPSY - FLEXIBLE N/A 09/17/2022    Performed by Tempie Hoist, DO at [facility] ENDO    CATHETERIZATION RIGHT HEART N/A 09/18/2022    Performed by Cath, Physician at [facility] CATH LAB    REMOVAL AND REPLACEMENT IMPLANTABLE DEFIBRILLATOR GENERATOR - SINGLE LEAD SYSTEM Left 11/04/2022    Performed by Kathreen Cornfield, MD at [facility] EP LAB    INSERTION/ REPLACEMENT PERMANENT PACEMAKER WITH ATRIAL LEAD Left 11/04/2022    Performed by Kathreen Cornfield, MD at [facility] EP LAB    Injection Venography Extremity Left 11/04/2022    Performed by Kathreen Cornfield, MD at [facility] EP LAB    ESOPHAGOGASTRODUODENOSCOPY WITH BIOPSY - FLEXIBLE N/A 03/29/2023    Performed by Buckles, Vinnie Level, MD at [facility] ENDO    ESOPHAGOGASTRODUODENOSCOPY WITH SNARE REMOVAL TUMOR/  POLYP/ OTHER LESION - FLEXIBLE N/A 03/29/2023    Performed by Buckles, Vinnie Level, MD at [facility] ENDO    BONE MARROW BIOPSY      CARDIAC DEFIBRILLATOR PLACEMENT      [employer]    CARDIAC SURGERY      CABG 3 V    CAROTID ENDARDECTOMY Right     COLONOSCOPY      HX CORONARY STENT PLACEMENT      HX HEART CATHETERIZATION      HX HYSTERECTOMY      TUNNELED VENOUS PORT PLACEMENT Right     Chest       SH:  Social History     Socioeconomic History    Marital status: Widowed   Tobacco Use    Smoking status: Former     Current packs/day: 0.00     Average packs/day: 1 pack/day for 20.0 years (20.0 ttl pk-yrs)     Types: Cigarettes     Start date: 9     Quit date: 2010     Years since quitting: 14.9     Passive exposure: Never    Smokeless tobacco: Never   Vaping Use    Vaping status: Never Used   Substance and Sexual Activity    Alcohol use: Not Currently    Drug use: Not Currently       FH:  Family History   Problem Relation Name Age of Onset    Cancer Mother      Diabetes Mother      Hypertension Mother      Cancer-Breast Sister      Cancer-Ovarian Sister      Cancer Sister      Diabetes Sister      Heart Disease Sister      Hypertension Sister      Heart Disease Brother      Hypertension Brother      Stroke Brother         Review of Systems:  10 point ROS negative except as above.  Please see HPI for additional pertinent documentation    Physical Exam:  Vitals:    04/03/23 0442 04/03/23 0500 04/03/23 0600 04/03/23 0754   BP:    115/58   BP Source:    Arm, Right Upper   Pulse: 90 84 88 95   Temp:    36.3 ?C (97.3 ?F)   SpO2:    99%   O2 Device:    None (Room air)   Weight:       Height:         Constitutional- Vitals above, no acute distress.   Head - Normocephalic, atraumatic.   Eyes -  EOMI grossly. No icterus or injection.   Ears, nose, mouth, throat- No oral ulcer or bleeding.   Neck - No swelling or tracheal deviation.   Respiratory - Symmetric chest rise, no increased [name] of breathing.  Cardiovascular - No pedal edema.  Gastrointestinal - Soft, nondistended, nontender to palpation.  Skin - No exposed rash, lesion.  Neurologic - No CN deficit, normal fluid speech  Psychiatric - Judgement intact, thought content appropriate.    Labs/Imaging:  Pertient labs/imaging were reviewed on initiation of progress note.

## 2023-04-03 NOTE — Progress Notes
General Progress Note    Name:  Raven [name]   ACZYS'A Date:  04/03/2023  Admission Date: 04/02/2023  LOS: 1 day                     Assessment/Plan:    Principal Problem:    GI bleed    Raven [name] is a 77 y.o. 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      # Acute on chronic symptomatic anemia (MM, CKD)  # Melena  # Hematemesis  # Chronic anticoagulation ?triple therapy  # Hx diverticulosis and sigmoid colon mass resection 07/2022  - 2 episodes of hematemesis morning of 12/6, 1 episode of melena 12/6 in ED  -transfused 2U PRBCs in ED for HGB of 6.9 (7.2 on 12/4)  -she is on [facility] outpatient with H/O  - 12/2 EGD: 1.5 cm pedunculated friable polyp in duodenum s/p clip x2, no bleeding seen.   - 12/6 CT a/p: No acute abdominopelvic inflammatory mass or bowel obstruction      Plan;  - GI consulted  - HGB stable, vitals stable  - Monitor HGB closely  - Transfuse as needed        # CAD s/p CABG and PCI  # HFrEF 35%, ICM on milrinone infusion, CRT-D  # Afib   # Carotid artery Disease with CEA 2016  -pt reports being on triple therapy (asa, plavix, eliquis) unsure why this is the case according to previous notes  - Discharged on Eliquis monotherapy however she thinks she had been taking plavix and aspirin as well post 12/4 discharge     Plan:  - Hold all AC for time being  - Continue PTA amiodarone and beta-blocker and milrinone drip     # Leukocytosis   -WBC 15.4 on admit, remains elevated.   -Hx of leukocytosis on previous admission  -Pt is on dex 8mg  qweek prior to chemo.  -Afeb, no infectious sxs.   > Continue to monitor off antibiotics.      # Multiple Myeloma  -follows [facility]  -continue ppx acyclovir  -continue dex (this is weekly)  -on outpatient darzalex     # CKD4, stable  -Cr baseline variable. Around 2.3, Cr at discharge 12/4 was 2.61. eGFR ranges 18-25.  Plan:  > Cr: 2.61 12/7  > Daily lytes/renal fct  > Strict I/os  > CTM closely   > Avoid nephrotoxic agents     # DM2,  - Hold PTA [name] and Levemir  > LDCF  > Accuchek ACHS    # Pancreatic Cyst  -CT A/P 12/6: Mild increase in size of indeterminate large lobulated cystic mass in   the tail of pancreas. Endoscopic ultrasound recommended for further   evaluation.     # subcentimeter nodular opacities in lungs  -CT A/P 12/6: Tree-in-bud and multiple subcentimeter nodular opacities are   indeterminate and may be from edema, atypical infection, and/or   aspiration. Short follow-up nonemergent CT chest in 6-8 weeks recommended   for reevaluation.       FEN: CLD     Prophylaxis Review:  Lines:  PIV  Urinary Catheter:  n/a  VTE: held in setting of GIB     Code status: Full Code     Disposition: Admit to med 1     Raven Humble, MD  Pager [phone]  __________________________________________________________________    Subjective  Raven [name] is a 77 y.o. female.  Patient resting in bed. She is feeling better today. She has not had any recurrent vomiting. Has not had a nother bowel movement yet. Does reports some small amount of blood when wiping after urinating. Discussed that the most likely cause of her bleeding is some bleeding from her prior procedure that was exacerbated by taking more blood thinning medication than prescribed. Discussed having son help her manage medicaitons in the future so ensure she is taking medications in a safe way.     Denies fever, chills, chest pain, soa, nausea, vomiting, diarrhea, constipation, abdominal pain, dysuria.    Medications  Scheduled Meds:acyclovir (ZOVIRAX) tablet 400 mg, 400 mg, Oral, BID  amiodarone (CORDARONE) tablet 200 mg, 200 mg, Oral, QDAY  budesonide-formoterol HFA (SYMBICORT) 160-4.5 mcg/actuation inhalation 2 puff, 2 puff, Inhalation, BID  carvediloL (COREG) tablet 3.125 mg, 3.125 mg, Oral, BID w/meals  insulin aspart (U-100) (NOVOLOG FLEXPEN U-100 INSULIN) injection PEN 0-6 Units, 0-6 Units, Subcutaneous, ACHS (22)  pantoprazole (PROTONIX) injection 40 mg, 40 mg, Intravenous, RUE(45-40)  rosuvastatin (CRESTOR) tablet 10 mg, 10 mg, Oral, QDAY  sertraline (ZOLOFT) tablet 50 mg, 50 mg, Oral, QDAY    Continuous Infusions:   milrinone (PRIMACOR) 20 mg/D5W 100 mL infusion 0.3 mcg/kg/min (04/02/23 1854)     PRN and Respiratory Meds:acetaminophen Q4H PRN, albuterol sulfate Q4H PRN, dextrose 50% (D50) IV PRN, melatonin QHS PRN, ondansetron Q6H PRN **OR** ondansetron (ZOFRAN) IV Q6H PRN, polyethylene glycol 3350 QDAY PRN, sennosides-docusate sodium QDAY PRN      Review of Systems:  A 5 point review of systems performed and is otherwise negative except for items mentioned in HPI.    Objective:                          Vital Signs: Last Filed                 Vital Signs: 24 Hour Range   BP: 121/46 (12/07 0242)  Temp: 36.8 ?C (98.2 ?F) (12/07 0242)  Pulse: 88 (12/07 0600)  Respirations: 18 PER MINUTE (12/07 0242)  SpO2: 99 % (12/07 0242)  O2 Device: None (Room air) (12/07 0242)  Height: 157.5 cm (5' 2) (12/06 1612) BP: (97-125)/(46-68)   Temp:  [36.5 ?C (97.7 ?F)-37.6 ?C (99.7 ?F)]   Pulse:  [72-102]   Respirations:  [14 PER MINUTE-26 PER MINUTE]   SpO2:  [93 %-99 %]   O2 Device: None (Room air)     Vitals:    04/02/23 1024 04/02/23 1612   Weight: 76.2 kg (167 lb 15.9 oz) 76.1 kg (167 lb 12.3 oz)       Intake/Output Summary:  (Last 24 hours)    Intake/Output Summary (Last 24 hours) at 04/03/2023 0708  Last data filed at 04/03/2023 0242  Gross per 24 hour   Intake 310 ml   Output --   Net 310 ml           Physical Exam    Physical Exam  Constitutional:       Appearance: She is obese.   HENT: Mouth/Throat:      Pharynx: Oropharynx is clear.   Eyes:  Extraocular Movements: Extraocular movements intact.      Conjunctiva/sclera: Conjunctivae normal.   Cardiovascular:      Rate and Rhythm: Normal rate.      Pulses: Normal pulses.      Heart sounds: Normal heart sounds.   Pulmonary:      Effort: Pulmonary effort is normal.      Breath sounds: Normal breath sounds.   Abdominal:      General: Bowel sounds are normal.      Palpations: Abdomen is soft.   Skin:     General: Skin is warm and dry.   Neurological:      General: No focal deficit present.      Mental Status: She is alert and oriented to person, place, and time. Mental status is at baseline.   Psychiatric:         Mood and Affect: Mood normal.          Lab Review  24-hour labs:    Results for orders placed or performed during the hospital encounter of 04/02/23 (from the past 24 hours)   CBC    Collection Time: 04/02/23 10:26 AM   Result Value Ref Range    White Blood Cells 15.40 (H) 4.50 - 11.00 10*3/uL    Red Blood Cells 2.40 (L) 4.00 - 5.00 10*6/uL    Hemoglobin 6.9 (L) 12.0 - 15.0 g/dL    Hematocrit 29.5 (L) 36.0 - 45.0 %    MCV 90.0 80.0 - 100.0 fL    MCH 28.9 26.0 - 34.0 pg    MCHC 32.1 32.0 - 36.0 g/dL    RDW 18.8 (H) 41.6 - 15.0 %    Platelet Count 261 150 - 400 10*3/uL    MPV 8.2 7.0 - 11.0 fL   COMPREHENSIVE METABOLIC PANEL    Collection Time: 04/02/23 10:26 AM   Result Value Ref Range    Sodium 142 137 - 147 mmol/L    Potassium 4.6 3.5 - 5.1 mmol/L    Chloride 108 98 - 110 mmol/L    Glucose 127 (H) 70 - 100 mg/dL    Blood Urea Nitrogen 60 (H) 7 - 25 mg/dL    Creatinine 6.06 (H) 0.40 - 1.00 mg/dL    Calcium 8.5 8.5 - 30.1 mg/dL    Total Protein 5.3 (L) 6.0 - 8.0 g/dL    Total Bilirubin 0.8 0.2 - 1.3 mg/dL    Albumin 3.3 (L) 3.5 - 5.0 g/dL    Alk Phosphatase 58 25 - 110 U/L    AST 14 7 - 40 U/L    ALT 10 7 - 56 U/L    CO2 21 21 - 30 mmol/L    Anion Gap 13 (H) 3 - 12    Glomerular Filtration Rate (GFR) 19 (L) >60 mL/min   COVID INFLUENZA A/B AND RSV PCR    Collection Time: 04/02/23 10:26 AM    Specimen: Nasopharyngeal; Swab   Result Value Ref Range    Influenza A Virus Not Detected Not Detected, Test Invalid    Influenza B Virus Not Detected Not Detected, Test Invalid    RSV PCR Not Detected Not Detected, Test Invalid    COVID-19 (SARS-CoV-2) PCR Not Detected Not Detected   URINALYSIS DIPSTICK REFLEX TO CULTURE    Collection Time: 04/02/23 11:12 AM    Specimen: Midstream; Urine   Result Value Ref Range    Color,UA Straw     Turbidity,UA Clear Clear    Specific Gravity-Urine 1.007 1.005 -  1.030    pH,UA 6.0 5.0 - 8.0    Protein,UA Negative Negative    Glucose,UA 3+ (A) Negative    Ketones,UA Negative Negative    Bilirubin,UA Negative Negative    Blood,UA Negative Negative    Urobilinogen,UA Normal Normal    Nitrite,UA Negative Negative    Leukocytes,UA Negative Negative   URINALYSIS MICROSCOPIC REFLEX TO CULTURE    Collection Time: 04/02/23 11:12 AM    Specimen: Midstream; Urine   Result Value Ref Range    WBCs,UA 0 - 2 None, 0 - 2  /HPF    RBCs,UA 0 - 2 None, 0 - 2  /HPF    Mucous,UA Trace None, Trace /LPF    Squamous Epithelial Cells 0 - 2 None, 0 - 2 , 2 - 5 /HPF   TYPE & CROSSMATCH    Collection Time: 04/02/23 11:37 AM   Result Value Ref Range    ABO/RH(D) B POS     Antibody Screen NEG     Crossmatch Expires 04/05/2023,2359     Units Ordered 2     Electronic Crossmatch YES     Record Check FOUND    PREPARE RBC - SUNQUEST    Collection Time: 04/02/23 11:37 AM   Result Value Ref Range    Units Ordered 2     Crossmatch Expires 04/05/2023,2359     Record Check FOUND     ABO/RH(D) B POS     Antibody Screen NEG     Electronic Crossmatch YES     Unit Number [id number]     Blood Component Type RBC,ADSOL,LEUKO REDUCED,IRRADIATED     Unit Division 00     Status OF Unit TRANSFUSED     ISSUE DATE TIME 119147829562     PRODUCT CODE [id number]     BLOOD TYPE B POS     CODING STATUS 7300     BLOOD EXPIRATION DATE 469629528413     Transfusion Status OK TO TRANSFUSE Crossmatch Result COMPATIBLE,ELECTRONIC     Unit Number [id number]     Blood Component Type RBC,ADSOL,LEUKO REDUCED,IRRADIATED     Unit Division 00     Status OF Unit TRANSFUSED     ISSUE DATE TIME 440347425956     PRODUCT CODE [id number]     BLOOD TYPE B NEG     CODING STATUS 1700     BLOOD EXPIRATION DATE 295188416606     Transfusion Status OK TO TRANSFUSE     Crossmatch Result COMPATIBLE,ELECTRONIC    CBC    Collection Time: 04/03/23  4:17 AM   Result Value Ref Range    White Blood Cells 14.20 (H) 4.50 - 11.00 10*3/uL    Red Blood Cells 2.92 (L) 4.00 - 5.00 10*6/uL    Hemoglobin 8.4 (L) 12.0 - 15.0 g/dL    Hematocrit 30.1 (L) 36.0 - 45.0 %    MCV 88.1 80.0 - 100.0 fL    MCH 28.7 26.0 - 34.0 pg    MCHC 32.6 32.0 - 36.0 g/dL    RDW 60.1 (H) 09.3 - 15.0 %    Platelet Count 227 150 - 400 10*3/uL    MPV 8.4 7.0 - 11.0 fL   BASIC METABOLIC PANEL    Collection Time: 04/03/23  4:17 AM   Result Value Ref Range    Sodium 145 137 - 147 mmol/L    Potassium 4.1 3.5 - 5.1 mmol/L    Chloride 110 98 - 110 mmol/L    Glucose 163 (H) 70 - 100  mg/dL    Blood Urea Nitrogen 68 (H) 7 - 25 mg/dL    Creatinine 1.61 (H) 0.40 - 1.00 mg/dL    Calcium 8.5 8.5 - 09.6 mg/dL    CO2 24 21 - 30 mmol/L    Anion Gap 11 3 - 12    Glomerular Filtration Rate (GFR) 18 (L) >60 mL/min       Point of Care Testing  (Last 24 hours)  Glucose: (!) 163 (04/03/23 0417)    Radiology and other Diagnostics Review:    CT ABD/PELV WO CONTRAST   Final Result      1. No acute abdominopelvic inflammatory mass or bowel obstruction.      2. Distal colonic anastomosis. Mild distal colonic diverticulosis.       3. Mild increase in size of indeterminate large lobulated cystic mass in the tail of pancreas. Endoscopic ultrasound recommended for further evaluation.      4. Partially visualized mild cardiomegaly. Mild interlobular septal thickening with trace right pleural effusion suggesting CHF/fluid overload. Tree-in-bud and multiple subcentimeter nodular opacities are indeterminate and may be from edema, atypical infection, and/or aspiration. Short follow-up nonemergent CT chest in 6-8 weeks recommended for reevaluation.          Finalized by Ancil Boozer, M.D. on 04/02/2023 12:59 PM. Dictated by Ancil Boozer, M.D. on 04/02/2023 12:42 PM.          Raven Humble, MD

## 2023-04-03 NOTE — Progress Notes
RT Adult Assessment Note    NAME:Raven [name]             MRN: [id number]             DOB:1945/10/16          AGE: 77 y.o.  ADMISSION DATE: 04/02/2023             DAYS ADMITTED: LOS: 0 days    Additional Comments:  Impressions of the patient: stable RA, refuses OSA no CPAP, BID symbicort taken at home  Intervention(s)/outcome(s): na  Patient education that was completed: na  Recommendations to the care team: na    Vital Signs:  Pulse: 93  RR: 18 PER MINUTE  SpO2: 97 %  O2 Device: None (Room air)  Liter Flow:    O2%:      Breath Sounds:   Right Base Breath Sounds: Decreased  Left Base Breath Sounds: Decreased  Respiratory Effort:      Comments: Refuses OSA, no CPAP, no CPOX

## 2023-04-03 NOTE — Progress Notes
Night 1900-0700    Pain: The patient is not having any pain    Nutrition: Diet- Diet: NPO    Activity: Ambulation Episodes: 1 walk during shift    Incentive Spirometer this shift: No  Max Volume: n/a    Acute Events, Nursing Intervention or Provider Communication: none    Patient Interventions and Education  Fall Risk/JHFRAT  Fall Risk Score: Total Fall Risk Score: 5.    The following interventions were implemented: Remembering to call for assistance with ambulation, Walking aides (gait belt, walker, cane, etc.), Fall identifiers in place, and Standard mobility safety, patient Verbalizes Understanding of fall risk factors and interventions. Patient compliance: Patient compliant with interventions.    Restraints: No  Restraints Goal: choice: N/A  See Docflowsheet for restraint documentation, interventions, education, etc.    Intake and Output:      Date 04/02/23 0701 - 04/03/23 0700 04/03/23 0701 - 04/04/23 0700   Shift 0701-1900 1901-0700 24 Hour Total 0701-1900 1901-0700 24 Hour Total   INTAKE   I.V.(mL/kg/hr)  10 10      Blood  300 300      Shift Total(mL/kg)  310(4.1) 310(4.1)      OUTPUT   Other           Stool Occurrence 1 x 1 x 2 x        Urine Occurrence  1 x 1 x 2 x      Shift Total(mL/kg)         NET  310 310      Weight (kg) 76.1 76.1 76.1 76.1 76.1 76.1         This RN provided education to patient. The following education topics were reviewed with patient: Medication Milrinone Patient Verbalizes Understanding     Discharge Plan: Discharge Planning: Patient has a ride

## 2023-04-03 NOTE — Progress Notes
HC4 END OF SHIFT NOTE    Admission Date: 04/02/2023  Length of Stay: LOS: 0 days    Acute events, nursing interventions, and communication with providers:       Activity: Walk x1      If Other, please explain:     Incentive Spirometer this shift: No  Max volume: n/a    Cardiac Rhythm: SR w/ PVC     Intake and Output:      No intake or output data in the 24 hours ending 04/02/23 1849           Last Bowel Movement Date:  (PTA)         Patient Education  Quality/Safety Education: Fall risk  Cardiac - Specific Education: Heart failure management  Other Patient education provided:   Learners: Patient  Method/materials used: Therapist, art  Response to learning: [employer] Understanding  Needs reinforcement on:     Patient Goal(s):  Patient will Report nausea and/or vomiting has improved  by the end of shift         Patient will  Report improve GI symptoms  by discharge date   Other:    Restraints:  No     Restraint Goal: Patient will be free from injury while physically restrained.  See Docflowsheet for restraint documentation, interventions, education, etc.

## 2023-04-04 ENCOUNTER — Inpatient Hospital Stay: Admit: 2023-04-04 | Discharge: 2023-04-04 | Payer: MEDICARE

## 2023-04-04 ENCOUNTER — Encounter: Admit: 2023-04-04 | Discharge: 2023-04-04 | Payer: MEDICARE

## 2023-04-04 DIAGNOSIS — K920 Hematemesis: Secondary | ICD-10-CM

## 2023-04-04 LAB — EGD REPORT

## 2023-04-04 MED ORDER — HALOPERIDOL LACTATE 5 MG/ML IJ SOLN
1 mg | Freq: Once | INTRAVENOUS | 0 refills | Status: AC | PRN
Start: 2023-04-04 — End: ?

## 2023-04-04 MED ORDER — EPINEPHRINE 0.1MG NS 10ML SYR
0 refills | Status: DC
Start: 2023-04-04 — End: 2023-04-04
  Administered 2023-04-04 (×2): 2 mL via INTRAMUSCULAR

## 2023-04-04 MED ORDER — FENTANYL CITRATE (PF) 50 MCG/ML IJ SOLN
25 ug | INTRAVENOUS | 0 refills | Status: DC | PRN
Start: 2023-04-04 — End: 2023-04-05

## 2023-04-04 MED ADMIN — PROPOFOL INJ 10 MG/ML IV VIAL [210331]: 50 ug/kg/min | INTRAVENOUS | @ 21:00:00 | Stop: 2023-04-04 | NDC 25021060820

## 2023-04-04 NOTE — Progress Notes
Night 1900-0700    Pain: The patient is not having any pain    Nutrition: Diet- Diet: CLD    Activity: Ambulation Episodes: 1 walk during shift    Incentive Spirometer this shift: No  Max Volume: n/a    Acute Events, Nursing Intervention or Provider Communication: Patient still having dark/bloody stools    Patient Interventions and Education  Fall Risk/JHFRAT  Fall Risk Score: Total Fall Risk Score: 8.    The following interventions were implemented: Remembering to call for assistance with ambulation, Walking aides (gait belt, walker, cane, etc.), Fall identifiers in place, and Standard mobility safety, patient Verbalizes Understanding of fall risk factors and interventions. Patient compliance: Patient compliant with interventions.    Restraints: No  Restraints Goal: choice: N/A  See Docflowsheet for restraint documentation, interventions, education, etc.    Intake and Output:      Date 04/03/23 0701 - 04/04/23 0700 04/04/23 0701 - 04/05/23 0700   Shift 0701-1900 1901-0700 24 Hour Total 0701-1900 1901-0700 24 Hour Total   INTAKE   P.O. 1920  1920      I.V.(mL/kg/hr)  10 10      Shift Total(mL/kg) 1920(25.2) 10(0.1) 1930(25.4)      OUTPUT   Other           Stool Occurrence 2 x 2 x 4 x        Urine Occurrence  2 x 3 x 5 x      Shift Total(mL/kg)         NET 1920 10 1930      Weight (kg) 76.1 76.1 76.1 76.1 76.1 76.1         This RN provided education to patient. The following education topics were reviewed with patient: Importance of Ambulation Patient Verbalizes Understanding     Discharge Plan: Discharge Planning: Patient has a ride

## 2023-04-04 NOTE — Progress Notes
End of Shift Report gave to [facility], Charity fundraiser. Patient is still in the EGD procedure.  Kathrin Ruddy, RN

## 2023-04-04 NOTE — Progress Notes
General Progress Note    Name:  Raven [name]   [id number] Date:  04/04/2023  Admission Date: 04/02/2023  LOS: 2 days                     Assessment/Plan:    Principal Problem:    GI bleed    Raven [name] is a 77 y.o. 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      # Acute on chronic symptomatic anemia (MM, CKD)  # Melena  # Hematemesis  # Chronic anticoagulation ?triple therapy  # Hx diverticulosis and sigmoid colon mass resection 07/2022  - 2 episodes of hematemesis morning of 12/6, 1 episode of melena 12/6 in ED  -transfused 2U PRBCs in ED for HGB of 6.9 (7.2 on 12/4)  -she is on [facility] outpatient with H/O  - 12/2 EGD: 1.5 cm pedunculated friable polyp in duodenum s/p clip x2, no bleeding seen.   - 12/6 CT a/p: No acute abdominopelvic inflammatory mass or bowel obstruction      Plan;  - GI will perform EGD today in setting of continued hematochezia and hgb dropping  - vitals stable  - Transfuse as needed        # CAD s/p CABG and PCI  # HFrEF 35%, ICM on milrinone infusion, CRT-D  # Afib   # Carotid artery Disease with CEA 2016  -pt reports being on triple therapy (asa, plavix, eliquis) unsure why this is the case according to previous notes  - Discharged on Eliquis monotherapy however she thinks she had been taking plavix and aspirin as well post 12/4 discharge     Plan:  - Hold all AC for time being  - Continue PTA amiodarone and beta-blocker and milrinone drip     # Leukocytosis   -WBC 15.4 on admit, remains elevated.   -Hx of leukocytosis on previous admission  -Pt is on dex 8mg  qweek prior to chemo.  -Afeb, no infectious sxs.   > Continue to monitor off antibiotics.      # Multiple Myeloma  -follows [facility]  -continue ppx acyclovir  -continue dex (this is weekly)  -on outpatient darzalex     # CKD4, stable  -Cr baseline variable. Around 2.3, Cr at discharge 12/4 was 2.61. eGFR ranges 18-25.  Plan:  > Cr: 2.31 12/8  > Daily lytes/renal fct  > Strict I/os  > CTM closely   > Avoid nephrotoxic agents     # DM2,  - Hold PTA [name] and Levemir  > LDCF  > Accuchek ACHS    # Pancreatic Cyst  -CT A/P 12/6: Mild increase in size of indeterminate large lobulated cystic mass in   the tail of pancreas. Endoscopic ultrasound recommended for further   evaluation.     # subcentimeter nodular opacities in lungs  -CT A/P 12/6: Tree-in-bud and multiple subcentimeter nodular opacities are   indeterminate and may be from edema, atypical infection, and/or   aspiration. Short follow-up nonemergent CT chest in 6-8 weeks recommended   for reevaluation.       FEN: CLD     Prophylaxis  Review:  Lines:  PIV  Urinary Catheter:  n/a  VTE: held in setting of GIB     Code status: Full Code     Disposition: Admit to med 1     Bethann Humble, MD  Pager [phone]  __________________________________________________________________    Subjective  Raven [name] is a 77 y.o. female.  Patient resting in bed. Reports two episodes of hematochezia. Reports blood in stool at 0330 and at 0700 this morning. She does not report lightheadedness, heart palpitations or dizziness. No abdominal pain this morning. Slight nausea but has not thrown up. Discussed that I will reach out to GI team to make them aware.        Medications  Scheduled Meds:acyclovir (ZOVIRAX) tablet 400 mg, 400 mg, Oral, BID  amiodarone (CORDARONE) tablet 200 mg, 200 mg, Oral, QDAY  budesonide-formoterol HFA (SYMBICORT) 160-4.5 mcg/actuation inhalation 2 puff, 2 puff, Inhalation, BID  carvediloL (COREG) tablet 3.125 mg, 3.125 mg, Oral, BID w/meals  insulin aspart (U-100) (NOVOLOG FLEXPEN U-100 INSULIN) injection PEN 0-6 Units, 0-6 Units, Subcutaneous, ACHS (22)  pantoprazole (PROTONIX) injection 40 mg, 40 mg, Intravenous, JWJ(19-14)  rosuvastatin (CRESTOR) tablet 10 mg, 10 mg, Oral, QDAY  sertraline (ZOLOFT) tablet 50 mg, 50 mg, Oral, QDAY    Continuous Infusions:   milrinone (PRIMACOR) 20 mg/D5W 100 mL infusion 0.3 mcg/kg/min (04/03/23 2209)     PRN and Respiratory Meds:acetaminophen Q4H PRN, albuterol sulfate Q4H PRN, dextrose 50% (D50) IV PRN, melatonin QHS PRN, ondansetron Q6H PRN **OR** ondansetron (ZOFRAN) IV Q6H PRN, polyethylene glycol 3350 QDAY PRN, sennosides-docusate sodium QDAY PRN      Review of Systems:  A 5 point review of systems performed and is otherwise negative except for items mentioned in HPI.    Objective:                          Vital Signs: Last Filed                 Vital Signs: 24 Hour Range   BP: 112/57 (12/08 0336)  Temp: 36.7 ?C (98.1 ?F) (12/08 7829)  Pulse: 92 (12/08 0500)  Respirations: 18 PER MINUTE (12/08 0336)  SpO2: 100 % (12/08 0336)  O2 Device: None (Room air) (12/08 0336) BP: (91-115)/(43-65)   Temp:  [36.3 ?C (97.3 ?F)-37.4 ?C (99.3 ?F)]   Pulse:  [85-104]   Respirations:  [16 PER MINUTE-18 PER MINUTE]   SpO2:  [97 %-100 %]   O2 Device: None (Room air)     Vitals:    04/02/23 1024 04/02/23 1612   Weight: 76.2 kg (167 lb 15.9 oz) 76.1 kg (167 lb 12.3 oz)       Intake/Output Summary:  (Last 24 hours)    Intake/Output Summary (Last 24 hours) at 04/04/2023 0734  Last data filed at 04/03/2023 2046  Gross per 24 hour   Intake 1930 ml   Output --   Net 1930 ml           Physical Exam    Physical Exam  Constitutional:       Appearance: She is obese.   HENT:      Mouth/Throat:      Pharynx: Oropharynx is clear.   Eyes:      Extraocular Movements: Extraocular movements intact.      Conjunctiva/sclera: Conjunctivae normal.   Cardiovascular:      Rate and Rhythm: Normal rate.      Pulses: Normal pulses.  Heart sounds: Normal heart sounds.   Pulmonary:      Effort: Pulmonary effort is normal.      Breath sounds: Normal breath sounds.   Abdominal:      General: Bowel sounds are normal.      Palpations: Abdomen is soft.   Skin:     General: Skin is warm and dry.   Neurological:      General: No focal deficit present.      Mental Status: She is alert and oriented to person, place, and time. Mental status is at baseline.   Psychiatric:         Mood and Affect: Mood normal.          Lab Review  24-hour labs:    Results for orders placed or performed during the hospital encounter of 04/02/23 (from the past 24 hours)   CBC    Collection Time: 04/04/23  3:41 AM   Result Value Ref Range    White Blood Cells 13.80 (H) 4.50 - 11.00 10*3/uL    Red Blood Cells 2.46 (L) 4.00 - 5.00 10*6/uL    Hemoglobin 7.2 (L) 12.0 - 15.0 g/dL    Hematocrit 30.8 (L) 36.0 - 45.0 %    MCV 88.6 80.0 - 100.0 fL    MCH 29.4 26.0 - 34.0 pg    MCHC 33.2 32.0 - 36.0 g/dL    RDW 65.7 (H) 84.6 - 15.0 %    Platelet Count 239 150 - 400 10*3/uL    MPV 7.9 7.0 - 11.0 fL   BASIC METABOLIC PANEL    Collection Time: 04/04/23  3:41 AM   Result Value Ref Range    Sodium 143 137 - 147 mmol/L    Potassium 4.0 3.5 - 5.1 mmol/L    Chloride 111 (H) 98 - 110 mmol/L    Glucose 208 (H) 70 - 100 mg/dL    Blood Urea Nitrogen 72 (H) 7 - 25 mg/dL    Creatinine 9.62 (H) 0.40 - 1.00 mg/dL    Calcium 8.1 (L) 8.5 - 10.6 mg/dL    CO2 21 21 - 30 mmol/L    Anion Gap 11 3 - 12    Glomerular Filtration Rate (GFR) 21 (L) >60 mL/min       Point of Care Testing  (Last 24 hours)  Glucose: (!) 208 (04/04/23 0341)    Radiology and other Diagnostics Review:    CT ABD/PELV WO CONTRAST   Final Result      1. No acute abdominopelvic inflammatory mass or bowel obstruction.      2. Distal colonic anastomosis. Mild distal colonic diverticulosis.       3. Mild increase in size of indeterminate large lobulated cystic mass in the tail of pancreas. Endoscopic ultrasound recommended for further evaluation.      4. Partially visualized mild cardiomegaly. Mild interlobular septal thickening with trace right pleural effusion suggesting CHF/fluid overload. Tree-in-bud and multiple subcentimeter nodular opacities are indeterminate and may be from edema, atypical infection, and/or aspiration. Short follow-up nonemergent CT chest in 6-8 weeks recommended for reevaluation.          Finalized by Ancil Boozer, M.D. on 04/02/2023 12:59 PM. Dictated by Ancil Boozer, M.D. on 04/02/2023 12:42 PM.          Bethann Humble, MD

## 2023-04-04 NOTE — Unmapped
Brief Operative Note    Name: Raven [name] is a 77 y.o. female     DOB: 1946/02/06             MRN#: [id number]  DATE OF OPERATION: 04/04/2023    Date:  04/04/2023        Preoperative Dx:   Gastrointestinal hemorrhage, unspecified gastrointestinal hemorrhage type [K92.2]    Post-op Diagnosis      * Gastrointestinal hemorrhage, unspecified gastrointestinal hemorrhage type [K92.2]    Procedure(s):  ESOPHAGOGASTRODUODENOSCOPY WITH CONTROL OF BLEEDING - FLEXIBLE    Surgeons and Role:     * Dmya Long, Vinnie Level, MD - Primary     * Patel, Harsh K, MBBS - Fellow    Findings:    Stalk from recent duodenal polypectomy with non-bleeding visible vessel  This was injected with 2 cc of 0.1 epinephrine, cauterized with 7 Fr BICAP and another clip placed to prevent further bleeding     Estimated Blood Loss: Minimal     Specimen(s) Removed/Disposition: * No specimens in log *    Complications:  None    Inpatient performed in the [facility] OR  Sedation provided by Anesthesia - see Anesthesia notes for detailed description of medications and dosages    Procedure report with complete description of findings and recommendations was generated in [facility] - this can be viewed under the Results Review tab    The patient is being followed by the GI Inpatient Consult Service    Disposition:  Return to inpatient unit for ongoing care     IV PPI gtt for 72 hours total  Restart Eliquis but hold Plavix per primary team    Prudy Feeler, MD  Pager [phone]

## 2023-04-04 NOTE — Progress Notes
OCCUPATIONAL THERAPY  NOTE   Name: Raven [name]   MRN: [id number]     DOB: [birth year]      Age: 77 y.o.  Admission Date: 04/02/2023     LOS: 2 days     Date of Service: 04/04/2023    OT consult received and appreciated. Pt out of room for procedure. Will follow-up as patient medically appropriate, available, and agreeable to be seen on 12/09. Thank you for considering our services in this patient's care.    Therapist: Tamera Stands, OTR/L ext [phone]  Date: 04/04/2023

## 2023-04-04 NOTE — Care Plan
Tentative plan for EGD today pending OR availability  Keep NPO  Continue IV PPI bid  Transfuse to goal Hgb >7

## 2023-04-05 ENCOUNTER — Encounter: Admit: 2023-04-05 | Discharge: 2023-04-05 | Payer: MEDICARE

## 2023-04-05 ENCOUNTER — Ambulatory Visit: Admit: 2023-04-05 | Discharge: 2023-04-05 | Payer: MEDICARE

## 2023-04-05 LAB — CBC
~~LOC~~ BKR RBC COUNT: 2.5 10*6/uL — ABNORMAL LOW (ref 4.00–5.00)
~~LOC~~ BKR WBC COUNT: 9.3 10*3/uL (ref 4.50–11.00)

## 2023-04-05 MED ORDER — OXYCODONE 5 MG PO TAB
5 mg | Freq: Once | ORAL | 0 refills | Status: CP
Start: 2023-04-05 — End: ?
  Administered 2023-04-05: 08:00:00 5 mg via ORAL

## 2023-04-05 NOTE — Progress Notes
OCCUPATIONAL THERAPY  ASSESSMENT NOTE      Name: Raven [name]   MRN: [id number]     DOB: 1946-04-07      Age: 77 y.o.  Admission Date: 04/02/2023     LOS: 3 days     Date of Service: 04/05/2023      Mobility  Patient Turn/Position: Self;Supine  Progressive Mobility Level: Walk in room  Distance Walked (feet): 50 ft  Level of Assistance: Assist X1  Assistive Device: Walker  Activity Limited By: Fatigue    Subjective  Significant Hospital Events: admitted for hematemesis, melena and general weakness  Mental / Cognitive: Alert;Oriented  Pain: No complaint of pain    Home Living Situation  Lives With: Family  Type of Home: House  Entry Stairs: No stairs  In-Home Stairs: 1 flight;Stair glide  Bathroom Setup: Walk in shower  Patient Owned Equipment: Bathing: 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)    Prior Level of Function  Level Of Independence: Independent with ADL and community mobility with device  Required Assist For: All personal care ADL;All home functioning ADL  History of Falls in Past 3 Months: No    ADL's  Where Assessed: Edge of Bed  Grooming Assist: Minimal Assist  Grooming Deficits: Wash/Dry Hands;Wash/Dry Face  LE Dressing Assist: Minimal Assist  LE Dressing Deficits: Don/Doff R Sock;Don/Doff L Sock  Comment: Completed bed mobility from supine to sitting EOB with SBA, completed LB dressing to don bilateral socks with minimal assistance, completed mobility in room ~10ft with minimal assistance using walker, completed standing grooming tasks with minimal assistance using walker, transferred back to supine in bed with SBA    ADL Mobility  Bed Mobility: Supine to Sit: Standby assist  Bed Mobility: Sit to Supine: Standby assist  Transfer Type: Sit to/from stand  Transfer: Assistance Level: Minimal assist  Transfer: Assistive Device: Roller walker  Transfer: Type of Assistance: For balance  End of Activity Status: In bed;Nursing notified  Sitting Balance: Standby assist;No UE support  Standing Balance: Minimal assist;2 UE support  Gait Distance: 50 feet  Gait: Assistance Level: Minimal assist  Gait: Assistive Device: Roller walker    Activity Tolerance  Endurance: 3/5 Tolerates 25-30 Minutes Exercise w/Multiple Rests    Cognition  Overall Cognitive Status: WFL to Adequately Complete Self Care Tasks Safely  Comprehension: WFL to Adequately Complete Self Care Tasks Safely  Orientation: Alert & Oriented x4  Attention: Awake/Alert    ROM  R UE ROM: WFL   R UE ROM Method: Active  L UE ROM: WFL   L UE ROM Method: Active  R LE ROM: WFL  L LE ROM: WFL    UE Strength / Tone  Strength Position Assessed: Seated  R UE Strength: WFL  L UE Strength: WFL  R LE Strength: WFL  L LE Strength: WFL    Education  Persons Educated: Patient  Patient Response: Verbalized Understanding  Topics: Role of OT, Goals for Therapy;Home safety;ADL Compensatory Techniques;Adaptive Devices for ADLs  Goal Formulation: With Patient    Assessment  Assessment: Decreased ADL Status;Decreased Endurance;Decreased Self-Care Trans;Decreased High-Level ADLs  Prognosis: Good  Goal Formulation: Patient  Comments: Patient presents with decreased mobility, decreased BUE strength, decreased gross motor coordination and decreased activity tolerance to perform ADLs independently. Patient would benefit from continued OT intervention in acute care setting to increase OOB activities.    AM-PAC 6 Clicks Daily Activity Inpatient  Putting on and taking off regular lower body clothes: A Little  Bathing (Including  washing, rinsing, drying): A Little  Toileting, which includes using toilet, bedpan, or urinal: A Little  Putting on and taking off regular upper body clothing: None  Taking care of personal grooming such as brushing teeth: None  Eating meals: None  Daily Activity Raw Score: 21  Standardized (T-scale) Score: 44.27    Plan  OT Frequency: 5x/week  OT Plan for Next Visit: progress mobility, toilet transfers    ADL Goals  Patient Will Perform All ADL's: w/ Modified Independence    Functional Transfer Goals  Pt Will Perform All Functional Transfers: Modified Independent    OT Discharge Recommendations  Recommendation: Home/prior living situation  Patient Currently Requires Physical Assist With: All personal care ADLs;All home functioning ADLs  Patient Currently Requires Equipment: None;Owns what is needed            Therapist: Jamesetta So, OTR/L [id number]  Date: 04/05/2023

## 2023-04-05 NOTE — Progress Notes
Gastroenterology Consultation - Follow Up      HISTORY OF PRESENT ILLNESS  Raven [name] is a pleasant 77 y.o. 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 She was recently here two weeks ago for melena and had a large friable duodenal polyp resected by hot snare and 2 clips placed. This was the presumed source of GI bleed. She then resumed ASA, Plavix, and Eliquis when she left and presented with recurrent melena and hematemesis. GI consulted for further evaluation and performed EGD on 12/9 - full report below.      24 Hour Events/Subjective:   Reviewed endoscopy report with patient. Patient reports multiple black stools last night. No stools today thus far. Hgb 5.8 this Am. 2U PRBC given today. Denies abdominal pain currently. Having some nausea. No vomiting.     REVIEW OF MEDICAL RECORDS  Past Medical History:   Diagnosis Date    Anxiety     Arthritis     Asthma     Asymptomatic stenosis of right carotid artery     Back pain     Cancer of uterus (HCC)     COPD (chronic obstructive pulmonary disease) (HCC)     Coronary artery disease     Diabetes mellitus (HCC)     Dyslipidemia     Flank mass     Fluid retention     Heart disease     Hypertension     Other malignant neoplasm without specification of site     Sleep apnea     Compliant w CPAP    Vision decreased      Surgical History:   Procedure Laterality Date    ROBOT ASSISTED LOW ANTERIOR RESECTION N/A 08/10/2022    Performed by Benetta Spar, MD at [facility] OR    ESOPHAGOGASTRODUODENOSCOPY WITH BIOPSY - FLEXIBLE N/A 09/17/2022    Performed by Tempie Hoist, DO at [facility] ENDO    CATHETERIZATION RIGHT HEART N/A 09/18/2022    Performed by Cath, Physician at [facility] CATH LAB    REMOVAL AND REPLACEMENT IMPLANTABLE DEFIBRILLATOR GENERATOR - SINGLE LEAD SYSTEM Left 11/04/2022    Performed by Kathreen Cornfield, MD at [facility] EP LAB    INSERTION/ REPLACEMENT PERMANENT PACEMAKER WITH ATRIAL LEAD Left 11/04/2022    Performed by Kathreen Cornfield, MD at [facility] EP LAB    Injection Venography Extremity Left 11/04/2022    Performed by Kathreen Cornfield, MD at [facility] EP LAB    ESOPHAGOGASTRODUODENOSCOPY WITH BIOPSY - FLEXIBLE N/A 03/29/2023    Performed by Buckles, Vinnie Level, MD at [facility] ENDO    ESOPHAGOGASTRODUODENOSCOPY WITH SNARE REMOVAL TUMOR/ POLYP/ OTHER LESION - FLEXIBLE N/A 03/29/2023    Performed by Buckles, Vinnie Level, MD at [facility] ENDO    BONE MARROW BIOPSY      CARDIAC DEFIBRILLATOR PLACEMENT      [employer]    CARDIAC SURGERY      CABG 3 V    CAROTID ENDARDECTOMY Right     COLONOSCOPY      HX CORONARY STENT PLACEMENT      HX HEART CATHETERIZATION      HX HYSTERECTOMY  TUNNELED VENOUS PORT PLACEMENT Right     Chest     Social History     Socioeconomic History    Marital status: Widowed   Tobacco Use    Smoking status: Former     Current packs/day: 0.00     Average packs/day: 1 pack/day for 20.0 years (20.0 ttl pk-yrs)     Types: Cigarettes     Start date: 67     Quit date: 2010     Years since quitting: 14.9     Passive exposure: Never    Smokeless tobacco: Never   Vaping Use    Vaping status: Never Used   Substance and Sexual Activity    Alcohol use: Not Currently    Drug use: Not Currently     Allergies   Allergen Reactions    Bortezomib RASH     Noted in Provider note 8/22 cancer treatment drug    Allopurinol HIVES    Sulfa (Sulfonamide Antibiotics) NAUSEA ONLY     Current Facility-Administered Medications   Medication Dose Route Frequency Provider Last Rate Last Admin    acetaminophen (TYLENOL) tablet 650 mg  650 mg Oral Q4H PRN Bethann Humble, MD   650 mg at 04/04/23 2330 acyclovir (ZOVIRAX) tablet 400 mg  400 mg Oral BID Bethann Humble, MD   400 mg at 04/05/23 0841    albuterol sulfate (PROAIR HFA) inhaler 2 puff  2 puff Inhalation Q4H PRN Bethann Humble, MD        amiodarone (CORDARONE) tablet 200 mg  200 mg Oral Fawn Kirk, Sam, MD   200 mg at 04/05/23 0841    budesonide-formoterol HFA (SYMBICORT) 160-4.5 mcg/actuation inhalation 2 puff  2 puff Inhalation BID Bethann Humble, MD   2 puff at 04/05/23 0855    carvediloL (COREG) tablet 3.125 mg  3.125 mg Oral BID w/meals Bethann Humble, MD   3.125 mg at 04/05/23 0841    dextrose 50% (D50) syringe 25-50 mL  12.5-25 g Intravenous PRN Bethann Humble, MD        insulin aspart (U-100) (NOVOLOG FLEXPEN U-100 INSULIN) injection PEN 0-6 Units  0-6 Units Subcutaneous ACHS (22) Detwiler, Sam, MD   1 Units at 04/05/23 0847    melatonin tablet 5 mg  5 mg Oral QHS PRN Bethann Humble, MD   5 mg at 04/04/23 2340    milrinone (PRIMACOR) 20 mg/D5W 100 mL infusion  0.3 mcg/kg/min (Dosing Weight) Intravenous Continuous Detwiler, Sam, MD 7 mL/hr at 04/05/23 0147 0.3 mcg/kg/min at 04/05/23 0147    ondansetron (ZOFRAN ODT) rapid dissolve tablet 4 mg  4 mg Oral Q6H PRN Bethann Humble, MD        Or    ondansetron HCL (PF) (ZOFRAN (PF)) injection 4 mg  4 mg Intravenous Q6H PRN Margaretmary Bayley, Sam, MD   4 mg at 04/05/23 0146    pantoprazole (PROTONIX) injection 40 mg  40 mg Intravenous WRU(04-54) Detwiler, Sam, MD   40 mg at 04/04/23 2012    polyethylene glycol 3350 (MIRALAX) packet 17 g  1 packet Oral QDAY PRN Bethann Humble, MD        rosuvastatin (CRESTOR) tablet 10 mg  10 mg Oral Fawn Kirk, Sam, MD   10 mg at 04/05/23 0841    sennosides-docusate sodium (SENOKOT-S) tablet 1 tablet  1 tablet Oral QDAY PRN Bethann Humble, MD        sertraline (ZOLOFT) tablet 50 mg  50 mg Oral Fawn Kirk, Sam, MD   50 mg at 04/05/23  1610     I personally reviewed past medical history, family history, and social history.     REVIEW OF SYSTEMS  HEENT: No loss of vision or bleeding gums   Cardiovascular: No chest pain    Respiratory: No wheezing   Gastrointestinal: See HPI above.     PHYSICAL EXAMINATION  Vital Signs: BP 102/81  - Pulse 92  - Temp 36.4 ?C (97.5 ?F)  - Ht 157.5 cm (5' 2)  - Wt 76.1 kg (167 lb 12.3 oz)  - SpO2 97%  - BMI 30.69 kg/m?   Body mass index is 30.69 kg/m?Marland Kitchen  Physical Exam  Constitutional:       General: She is awake.   Cardiovascular:      Rate and Rhythm: Normal rate.   Pulmonary:      Effort: Pulmonary effort is normal. No respiratory distress.   Abdominal:      General: Bowel sounds are normal. There is no distension.      Palpations: Abdomen is soft.      Tenderness: There is no abdominal tenderness.   Neurological:      Mental Status: She is alert.   Psychiatric:         Mood and Affect: Mood normal.         Speech: Speech normal.        RECENT LABS - Pertinent labs reviewed by me personally and abbreviated in HPI above.     RADIOLOGY - I have personally reviewed the following pertinent radiology images and/or reports:     CT AP WO Contrast (04/02/2023)  1. No acute abdominopelvic inflammatory mass or bowel obstruction.   2. Distal colonic anastomosis. Mild distal colonic diverticulosis.   3. Mild increase in size of indeterminate large lobulated cystic mass in the tail of pancreas. Endoscopic ultrasound recommended for further evaluation.   4. Partially visualized mild cardiomegaly. Mild interlobular septal thickening with trace right pleural effusion suggesting CHF/fluid overload. Tree-in-bud and multiple subcentimeter nodular opacities are indeterminate and may be from edema, atypical infection, and/or aspiration. Short follow-up nonemergent CT chest in 6-8 weeks recommended for reevaluation.     GI PROCEDURES - I have personally reviewed the following pertinent GI procedure images and/or reports:     EGD (04/04/2023)  - Z-line regular, 37 cm from the incisors.   - Esophagogastric landmarks identified.   - Normal stomach.   - At the D1-D2 junction there was the site of previous polypectomy with one endoclip on it and the other endoclip one fold distal to it. At the tip of the post polypectomy site was an ulcer with a visible vessel [facility]). There was no active bleeding. Treated with epinephrine injection at the base, thermal therapy using gold probe and mechanical therapy using one endoclip.     EGD (03/29/2023)  - Normal esophagus   - 3 cm type-I sliding hiatal hernia.   - Normal stomach.   - A few duodenal polyps.   - 1.5 cm pedunculated duodenal polyp. Resected and retrieved. Two clips (MR conditional) were placed. Clip manufacturer: [employer].      EGD (09/17/2022)  - Normal esophagus.   - Z-line regular, 37 cm from the incisors.   - Gastroesophageal flap valve classified as Hill Grade III (minimal fold, loose to endoscope, hiatal hernia likely).   - 3 cm hiatal hernia.   - Congestive gastropathy.   - Erythematous mucosa in the prepyloric region of the stomach. Biopsied.   - A single 6  mm sessile polyp with no bleeding and no stigmata of recent bleeding was found in the gastric body.   - A single 6 mm sessile polyp with no bleeding was found in the first portion of the duodenum.   - A single 15 mm pedunculated polyp with no bleeding was found in the second portion of the duodenum.   - Polyps were not removed today due to last plavix use 5/21.      Final Pathology Diagnosis:   A. Gastric oxyntic and antral-type mucosa, gastric biopsy r/o H pylori, biopsy:   Oxyntic-type mucosa with focally active gastritis.   Antral-type mucosa with features of reactive gastropathy.   Immunohistochemical stain for H. pylori is negative.     ASSESSMENT AND PLAN  Hematochezia  Melena  Severe mitral valve regurgitation  Anemia requiring transfusion  Hx of sigmoid colon mass resection in April 2024  Multiple myeloma on Daratumumab  CKD4  Hematemesis  HFrEF - on Milrinone    Recommendations:  - IV PPI for total 72 h, bid ppi at discharge for 8 weeks. Hold anticoagulation for 72h. Okay to resume eliquis after 72h if indicated.   - Monitor for recurrent bleeding  - Trend H/H, transfuse pRBCs per protocol if Hgb <7gm/dl   - Continue clear liquid diet   - Will plan for EUS in the next month for cystic mass in pancreas    Total Time Today was 35 minutes in the following activities: Preparing to see the patient, Obtaining and/or reviewing separately obtained history, Performing a medically appropriate examination and/or evaluation, Counseling and educating the patient/family/caregiver, Documenting clinical information in the electronic or other health record, Independently interpreting results (not separately reported) and communicating results to the patient/family/caregiver, and Care coordination (not separately reported)     Patient discussed and plan agreed upon with Dr. Reina Fuse. Thank you for this consultation. It has been a pleasure to be a part of the care team. Please feel free to contact the GI consult team with any questions or concerns.    Epifanio Lesches, MSN, APRN, FNP-C  [facility]  Division of Gastroenterology  Available via [place]  M-[phone], otherwise contact GI fellow on call

## 2023-04-05 NOTE — Progress Notes
PHYSICAL THERAPY  NOTE      Name: Raven [name]   MRN: [id number]     DOB: Sep 16, 1945      Age: 77 y.o.  Admission Date: 04/02/2023     LOS: 3 days     Date of Service: 04/05/2023      Based on discussion with OT, patient's current level of function suggests that patient will progress with one discipline. PT will sign off at this time. Please re-consult if a change in functional status occurs.     Therapist: Criselda Peaches, PT  Date: 04/05/2023

## 2023-04-05 NOTE — Progress Notes
Night 1900-0700    Pain: Pain is not well controlled. Medication(s) being used: Acetaminophen    Nutrition: Diet- Diet: CLD    Activity: Ambulation Episodes: 1 walk during shift    Incentive Spirometer this shift: No  Max Volume: none    Acute Events, Nursing Intervention or Provider Communication: c/o increased pain generalized and nausea. Hemoglobin 5.8     Patient Interventions and Education  Fall Risk/JHFRAT  Fall Risk Score: Total Fall Risk Score: 7.    The following interventions were implemented: Remembering to call for assistance with ambulation, Communicate recent fall and/or risk of harm, Assistance managing patient care equipment, and Standard mobility safety, patient Verbalizes Understanding and Demonstrated Understanding of fall risk factors and interventions. Patient compliance: Patient compliant with interventions.    Restraints: No  Restraints Goal: choice: N/A  See Docflowsheet for restraint documentation, interventions, education, etc.    Intake and Output:      Date 04/04/23 0701 - 04/05/23 0700 04/05/23 0701 - 04/06/23 0700   Shift 0701-1900 1901-0700 24 Hour Total 0701-1900 1901-0700 24 Hour Total   INTAKE   P.O. 120 720 840      I.V.(mL/kg/hr)  56.6 56.6      Blood  300 300      Shift Total(mL/kg) 120(1.6) 1076.6(14.1) 1196.6(15.7)      OUTPUT   Other           Stool Occurrence 2 x 3 x 5 x        Urine Occurrence  2 x 3 x 5 x      Shift Total(mL/kg)         NET 120 1076.6 1196.6      Weight (kg) 76.1 76.1 76.1 76.1 76.1 76.1         This RN provided education to patient. The following education topics were reviewed with patient: Medication scheduled medication and pain managment  Patient Verbalizes Understanding and Demonstrated Understanding     Discharge Plan: Discharge Planning: Patient has a ride

## 2023-04-05 NOTE — Anesthesia Pain Rounding
Anesthesia Follow-Up Evaluation: Post-Procedure Day One    Name: Raven [name]     MRN: [id number]     DOB: 06/24/1945     Age: 77 y.o.     Sex: female   __________________________________________________________________________     Procedure Date: 04/04/2023   Procedure: Procedure(s):  ESOPHAGOGASTRODUODENOSCOPY WITH CONTROL OF BLEEDING - FLEXIBLE    Physical Assessment  Height: 157.5 cm (5' 2)  Weight: 76.1 kg (167 lb 12.3 oz)    Vital Signs (Last Filed in 24 hours)  BP: 108/59 (12/09 0731)  Temp: 36.7 ?C (98.1 ?F) (12/09 8413)  Pulse: 92 (12/09 0536)  Respirations: 16 PER MINUTE (12/09 0731)  SpO2: 99 % (12/09 0731)  O2 Device: None (Room air) (12/09 0731)    Patient History   Allergies  Allergies   Allergen Reactions    Bortezomib RASH     Noted in Provider note 8/22 cancer treatment drug    Allopurinol HIVES    Sulfa (Sulfonamide Antibiotics) NAUSEA ONLY        Medications  Scheduled Meds:acyclovir (ZOVIRAX) tablet 400 mg, 400 mg, Oral, BID  amiodarone (CORDARONE) tablet 200 mg, 200 mg, Oral, QDAY  budesonide-formoterol HFA (SYMBICORT) 160-4.5 mcg/actuation inhalation 2 puff, 2 puff, Inhalation, BID  carvediloL (COREG) tablet 3.125 mg, 3.125 mg, Oral, BID w/meals  insulin aspart (U-100) (NOVOLOG FLEXPEN U-100 INSULIN) injection PEN 0-6 Units, 0-6 Units, Subcutaneous, ACHS (22)  pantoprazole (PROTONIX) injection 40 mg, 40 mg, Intravenous, KGM(01-02)  rosuvastatin (CRESTOR) tablet 10 mg, 10 mg, Oral, QDAY  sertraline (ZOLOFT) tablet 50 mg, 50 mg, Oral, QDAY    Continuous Infusions:   milrinone (PRIMACOR) 20 mg/D5W 100 mL infusion 0.3 mcg/kg/min (04/05/23 0147)     PRN and Respiratory Meds:acetaminophen Q4H PRN, albuterol sulfate Q4H PRN, dextrose 50% (D50) IV PRN, melatonin QHS PRN, ondansetron Q6H PRN **OR** ondansetron (ZOFRAN) IV Q6H PRN, polyethylene glycol 3350 QDAY PRN, sennosides-docusate sodium QDAY PRN      Diagnostic Tests  Hematology:   Lab Results   Component Value Date    HGB 5.8 04/05/2023 HGB 6.8 03/23/2023    HCT 17.3 04/05/2023    HCT 22 03/23/2023    PLTCT 226 04/05/2023    PLTCT 315 03/23/2023    WBC 9.90 04/05/2023    WBC 12.2 03/23/2023    NEUT 62 03/31/2023    NEUT 49 02/10/2023    ANC 7.10 03/31/2023    ANC 8.9 03/26/2023    ANC 12.22 11/01/2022    ANC 2 10/05/2022    LYMPH 8 03/26/2023    LYMPH 10 11/01/2022    ALC 1.80 03/31/2023    ALC 1.98 02/10/2023    MONA 13 03/31/2023    MONA 15 02/10/2023    AMC 1.50 03/31/2023    AMC 0.98 02/10/2023    EOSA 8 03/31/2023    EOSA 8 09/23/2022    ABC 0.10 03/31/2023    ABC 0.04 02/10/2023    BASOPHILS 1 03/26/2023    MCV 88.5 04/05/2023    MCV 100 03/23/2023    MCH 29.4 04/05/2023    MCH 31 03/23/2023    MCHC 33.2 04/05/2023    MCHC 31 03/23/2023    MPV 8.2 04/05/2023    MPV 11.3 03/23/2023    RDW 24.0 04/05/2023    RDW 22.9 03/23/2023         General Chemistry:   Lab Results   Component Value Date    NA 142 04/05/2023    NA 141 03/23/2023  K 3.6 04/05/2023    K 4.5 03/23/2023    CL 111 04/05/2023    CL 109 03/23/2023    CO2 20 04/05/2023    CO2 25 03/23/2023    GAP 11 04/05/2023    GAP 7 03/23/2023    BUN 69 04/05/2023    BUN 49 03/23/2023    CR 2.39 04/05/2023    CR 2.74 03/23/2023    GLU 203 04/05/2023    GLU 129 03/23/2023    CA 7.9 04/05/2023    CA 8.9 03/23/2023    ALBUMIN 3.3 04/02/2023    ALBUMIN 3.4 03/23/2023    LACTIC 1.5 11/01/2022    MG 2.2 03/31/2023    MG 2.3 03/23/2023    TOTBILI 0.8 04/02/2023    TOTBILI 0.7 03/23/2023    PO4 4.2 03/26/2023    PO4 4.1 11/02/2022      Coagulation:   Lab Results   Component Value Date    PT 14.5 09/18/2022    PTT 33.2 03/24/2023    PTT 70.6 09/22/2022    INR 1.3 09/18/2022         Follow-Up Assessment  Patient location during evaluation: floor      Anesthetic Complications:   Anesthetic complications: The patient did not experience any anesthestic complications.      Pain:  Score: 6    Management:adequate     Level of Consciousness: awake and alert and sleepy but conscious   Hydration:acceptable Airway Patency: patent   Respiratory Status: acceptable, spontaneous ventilation and room air     Cardiovascular Status:acceptable, hemodynamically stable and stable   Regional/Neuroaxial:              Comments: Denies N/V, pain adequately controlled on current medication

## 2023-04-05 NOTE — Progress Notes
Pharmacy High Risk Medication Review    A high risk medication review has been performed for Raven [name] by a pharmacy team member.      Medication class(es) to review on inpatient med list:  None Identified      The medications class(es) are noted to be high risk to mobility or mentation in patients >77 years of age. If your patient has been CAM positive (diagnosed with delirium) at any point during this hospitalization then it would be particularly helpful to review these medications. Each medication has its own unique risks and benefits to the patient, we ask that these medications are reviewed and considered for de-prescribing where appropriate.     Consider reviewing [email or web address] for anticholinergic burden calculation.  If you need further assistance identifying or de-prescribing, please contact pharmacy.      Current Inpatient Medications:  Scheduled Meds:acyclovir (ZOVIRAX) tablet 400 mg, 400 mg, Oral, BID  amiodarone (CORDARONE) tablet 200 mg, 200 mg, Oral, QDAY  budesonide-formoterol HFA (SYMBICORT) 160-4.5 mcg/actuation inhalation 2 puff, 2 puff, Inhalation, BID  carvediloL (COREG) tablet 3.125 mg, 3.125 mg, Oral, BID w/meals  insulin aspart (U-100) (NOVOLOG FLEXPEN U-100 INSULIN) injection PEN 0-6 Units, 0-6 Units, Subcutaneous, ACHS (22)  pantoprazole (PROTONIX) injection 40 mg, 40 mg, Intravenous, FAO(13-08)  rosuvastatin (CRESTOR) tablet 10 mg, 10 mg, Oral, QDAY  sertraline (ZOLOFT) tablet 50 mg, 50 mg, Oral, QDAY    Continuous Infusions:   milrinone (PRIMACOR) 20 mg/D5W 100 mL infusion 0.3 mcg/kg/min (04/05/23 0147)     PRN and Respiratory Meds:acetaminophen Q4H PRN, albuterol sulfate Q4H PRN, dextrose 50% (D50) IV PRN, melatonin QHS PRN, ondansetron Q6H PRN **OR** ondansetron (ZOFRAN) IV Q6H PRN, polyethylene glycol 3350 QDAY PRN, sennosides-docusate sodium QDAY PRN        Thank you,  Davy Pique, PHARMD  04/05/2023

## 2023-04-05 NOTE — Progress Notes
RT Adult Assessment Note    NAME:Raven [name]             MRN: [id number]             DOB:11-Jul-1945          AGE: 77 y.o.  ADMISSION DATE: 04/02/2023             DAYS ADMITTED: LOS: 3 days    Additional Comments:  Impressions of the patient: Patient resting in bed. No visible signs of respiratory distress noted att his time.   Intervention(s)/outcome(s): RT Eval  Patient education that was completed: N/A  Recommendations to the care team: None at this time.     Vital Signs:  Pulse:    RR: 16 PER MINUTE  SpO2: 97 %  O2 Device: None (Room air)  Respiratory WDL: Within Defined Limits  Respiratory Effort/Pattern: Unlabored  Comments: Ref OSA

## 2023-04-06 ENCOUNTER — Encounter: Admit: 2023-04-06 | Discharge: 2023-04-06 | Payer: MEDICARE

## 2023-04-06 LAB — PREPARE RBC - SUNQUEST
ANTIBODY SCREEN: NEGATIVE 10*6/uL — ABNORMAL LOW (ref 4.40–5.50)
BLOOD EXPIRATION DATE: 202 mg/dL — ABNORMAL HIGH (ref 0.40–1.24)
BLOOD EXPIRATION DATE: 202
BLOOD EXPIRATION DATE: 202
BLOOD EXPIRATION DATE: 202
CODING STATUS: 170 mL/min (ref >60)
CODING STATUS: 730 mg/dL (ref 7–25)
CODING STATUS: 170
CODING STATUS: 730
ISSUE DATE TIME: 202 % (ref 11.0–15.0)
ISSUE DATE TIME: 202 U/L (ref 7–56)
ISSUE DATE TIME: 202
ISSUE DATE TIME: 202
PRODUCT CODE: 0 10*3/uL (ref 150–400)
PRODUCT CODE: 0 mmol/L (ref 21–30)
PRODUCT CODE: 0
PRODUCT CODE: 0
UNIT DIVISION: 0 U/L (ref 25–110)
UNIT DIVISION: 0 pg (ref 26.0–34.0)
UNIT DIVISION: 0
UNIT DIVISION: 0
UNITS ORDERED: 4

## 2023-04-06 LAB — TYPE & CROSSMATCH
~~LOC~~ BKR ANTIBODY SCREEN: NEGATIVE mL/m2 (ref 16–34)
~~LOC~~ BKR UNITS ORDERED: 4 g/m2 (ref 49–115)

## 2023-04-06 LAB — CBC
~~LOC~~ BKR HEMATOCRIT: 22 % — ABNORMAL LOW (ref 36.0–45.0)
~~LOC~~ BKR HEMOGLOBIN: 7.7 g/dL — ABNORMAL LOW (ref 12.0–15.0)
~~LOC~~ BKR MCH: 29 pg (ref 26.0–34.0)
~~LOC~~ BKR MCHC: 33 g/dL (ref 32.0–36.0)
~~LOC~~ BKR MCV: 88 fL (ref 80.0–100.0)
~~LOC~~ BKR MPV: 8.1 fL (ref 7.0–11.0)
~~LOC~~ BKR PLATELET COUNT: 213 10*3/uL (ref 150–400)
~~LOC~~ BKR RBC COUNT: 2.5 10*6/uL — ABNORMAL LOW (ref 4.00–5.00)
~~LOC~~ BKR RDW: 19 % — ABNORMAL HIGH (ref 11.0–15.0)
~~LOC~~ BKR WBC COUNT: 9.8 10*3/uL (ref 4.50–11.00)

## 2023-04-06 MED ORDER — POTASSIUM CHLORIDE 20 MEQ PO TBTQ
60 meq | Freq: Once | ORAL | 0 refills | Status: CP
Start: 2023-04-06 — End: ?
  Administered 2023-04-06: 15:00:00 60 meq via ORAL

## 2023-04-06 MED ORDER — ROSUVASTATIN 10 MG PO TAB
10 mg | ORAL_TABLET | Freq: Every day | ORAL | 0 refills | Status: CN
Start: 2023-04-06 — End: ?

## 2023-04-06 MED ORDER — PANTOPRAZOLE 40 MG IV SOLR
40 mg | Freq: Two times a day (BID) | INTRAVENOUS | 0 refills | Status: CP
Start: 2023-04-06 — End: ?
  Administered 2023-04-07 (×2): 40 mg via INTRAVENOUS

## 2023-04-06 MED ORDER — PANTOPRAZOLE 40 MG PO TBEC
40 mg | Freq: Two times a day (BID) | ORAL | 0 refills | Status: DC
Start: 2023-04-06 — End: 2023-04-06

## 2023-04-06 NOTE — Progress Notes
Day 0700-1900    Pain: The patient is not having any pain    Nutrition: Diet- Diet: CLD    Activity: Ambulation Episodes: 2 walks during shift    Incentive Spirometer this shift: No  Max Volume:       Acute Events, Nursing Intervention or Provider Communication: Mammie Russian, MD. Request for patient to have sleep bundle protocol; OK to defer 0000 & 0400 Vitals as well as push back labs to later time.     Patient Interventions and Education  Fall Risk/JHFRAT  Fall Risk Score: Total Fall Risk Score: 8.    The following interventions were implemented: Remembering to call for assistance with ambulation, Bed/chair alarm, and Standard mobility safety, patient Verbalizes Understanding and Demonstrated Understanding of fall risk factors and interventions. Patient compliance: Patient compliant with interventions.    Restraints: No  Restraints Goal: choice: N/A  See Docflowsheet for restraint documentation, interventions, education, etc.    Intake and Output:      Date 04/04/23 1901 - 04/05/23 0700 04/05/23 0701 - 04/06/23 0700   Shift 1901-0700 24 Hour Total 0701-1900 1901-0700 24 Hour Total   INTAKE   P.O. [phone]  1560   I.V.(mL/kg/hr) 56.6 56.6      Blood 900 900      Shift Total(mL/kg) 6962.9(52) 1796.6(23.6) 1560(20.5)  1560(20.5)   OUTPUT   Other          Stool Occurrence 3 x 5 x 1 x  1 x     Urine Occurrence  3 x 5 x 3 x  3 x   Shift Total(mL/kg)        NET 1676.6 1796.6 1560  1560   Weight (kg) 76.1 76.1 76.1 76.1 76.1         This RN provided education to patient. The following education topics were reviewed with patient: Other Blood transfusion  Patient Verbalizes Understanding and Demonstrated Understanding     Discharge Plan: Discharge Planning: Patient has a ride

## 2023-04-06 NOTE — Progress Notes
General Progress Note    Name:  Raven [name]   ZOXWR'U Date:  04/06/2023  Admission Date: 04/02/2023  LOS: 4 days                     Assessment/Plan:    Principal Problem:    GI bleed    Raven [name] is a 77 y.o. 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     Interval Updates:  -CBC with Hgb 7.7 this afternoon, stable  -Changed diet to full liquid     # Acute on chronic symptomatic anemia (MM, CKD) (s/p 2 unit pRBC)  # Melena  # Hematemesis (resolved)  # Chronic anticoagulation ?triple therapy (holding)  # Hx diverticulosis and sigmoid colon mass resection 07/2022  - 2 episodes of hematemesis morning of 12/6, 1 episode of melena 12/6 in ED  -transfused 2U PRBCs in ED for HGB of 6.9 (7.2 on 12/4)  -she is on Aranesp outpatient with H/O  - 12/2 EGD: 1.5 cm pedunculated friable polyp in duodenum s/p clip x2, no bleeding seen.   - 12/6 CT a/p: No acute abdominopelvic inflammatory mass or bowel obstruction   -12/8 EGD with ulcer in duodenum and visible vessel, no active bleeding.  Epinephrine injection utilized at pace with thermal therapy and mechanical therapy of Endo Clip placed.  -Received 2 units pRBC 12/9  Plan:  -IV pantoprazole for 72 hours post EGD, twice daily PPI afterwards x8 weeks.  -Hold anticoagulation for 72 hours from 12/8  -Can resume Eliquis after 72 hours if hematochezia resolves  -Clear liquid diet  -Transfuse as needed  -Sleep bundle     # CAD s/p CABG and PCI  # HFrEF 35%, ICM on milrinone infusion, CRT-D  # Afib   # Carotid artery Disease with CEA 2016  -pt reports being on triple therapy (asa, plavix, eliquis) unsure why this is the case according to previous notes  - Discharged on Eliquis monotherapy however she thinks she had been taking plavix and aspirin as well post 12/4 discharge  Plan:  - Hold all AC for time being  - Continue PTA amiodarone and beta-blocker and milrinone drip     # Leukocytosis (resolved)  -WBC 15.4 on admit  -Hx of leukocytosis on previous admission     # Multiple Myeloma  -[facility]s [facility]  -continue ppx acyclovir  -continue dex (this is weekly)  -on outpatient darzalex     # CKD4, stable  -Cr baseline variable. Around 2.3, Cr at discharge 12/4 was 2.61. eGFR ranges 18-25.  Plan:  > Cr: 2.31 12/8  > Daily lytes/renal fct  > Strict I/os  > CTM closely   > Avoid nephrotoxic agents     # DM2,  - Hold PTA [name] and Levemir  > LDCF  > Accuchek ACHS    # Pancreatic Cyst  -CT A/P 12/6: Mild increase in size of indeterminate large lobulated cystic mass in   the tail of pancreas. Endoscopic ultrasound recommended for further   evaluation.   Plan  -Planning for EUS in the next month for cystic mass in pancreas  -PT  recs of home/prior situation    # subcentimeter nodular opacities in lungs  -CT A/P 12/6: Tree-in-bud and multiple subcentimeter nodular opacities are   indeterminate and may be from edema, atypical infection, and/or   aspiration. Short follow-up nonemergent CT chest in 6-8 weeks recommended   for reevaluation.       FEN: full liquid  Lines:  PIV  Urinary Catheter:  n/a  VTE: held in setting of GIB, SCDs    Code status: Full Code  Disposition: Med1     Raven Polite, DO  Internal Medicine PGY-1  _______________________________________________________    Subjective  Patient feels well today and has no concerns at present. No fatigue, still having dark bowel movements, no apparent blood but they are dark brown/black. 2-3 episodes of this yesterday.    Medications  Scheduled Meds:acyclovir (ZOVIRAX) tablet 400 mg, 400 mg, Oral, BID  amiodarone (CORDARONE) tablet 200 mg, 200 mg, Oral, QDAY  budesonide-formoterol HFA (SYMBICORT) 160-4.5 mcg/actuation inhalation 2 puff, 2 puff, Inhalation, BID  carvediloL (COREG) tablet 3.125 mg, 3.125 mg, Oral, BID w/meals  insulin aspart (U-100) (NOVOLOG FLEXPEN U-100 INSULIN) injection PEN 0-6 Units, 0-6 Units, Subcutaneous, ACHS (22)  pantoprazole (PROTONIX) injection 40 mg, 40 mg, Intravenous, GNF(62-13)  rosuvastatin (CRESTOR) tablet 10 mg, 10 mg, Oral, QDAY  sertraline (ZOLOFT) tablet 50 mg, 50 mg, Oral, QDAY    Continuous Infusions:   milrinone (PRIMACOR) 20 mg/D5W 100 mL infusion 0.3 mcg/kg/min (04/06/23 0729)     PRN and Respiratory Meds:acetaminophen Q4H PRN, albuterol sulfate Q4H PRN, dextrose 50% (D50) IV PRN, melatonin QHS PRN, ondansetron Q6H PRN **OR** ondansetron (ZOFRAN) IV Q6H PRN, polyethylene glycol 3350 QDAY PRN, sennosides-docusate sodium QDAY PRN    Objective:                        Vital Signs: Last Filed                 Vital Signs: 24 Hour Range   BP: 88/32 (12/10 0726)  Temp: 36.6 ?C (97.8 ?F) (12/10 0865)  Pulse: 82 (12/10 0726)  Respirations: 18 PER MINUTE (12/10 0726)  SpO2: 96 % (12/10 0422)  O2 Device: None (Room air) (12/10 0422) BP: (87-111)/(32-81)   Temp:  [36.4 ?C (97.5 ?F)-37.1 ?C (98.7 ?F)]   Pulse:  [82-89]   Respirations:  [16 PER MINUTE-18 PER MINUTE]   SpO2:  [96 %-100 %]   O2 Device: None (Room air)     Vitals:    04/02/23 1024 04/02/23 1612 04/06/23 0200   Weight: 76.2 kg (167 lb 15.9 oz) 76.1 kg (167 lb 12.3 oz) 75.9 kg (167 lb 5.3 oz)       Intake/Output Summary:  (Last 24 hours)    Intake/Output Summary (Last 24 hours) at 04/06/2023 7846  Last data filed at 04/05/2023 1600  Gross per 24 hour   Intake 960 ml   Output --   Net 960 ml     Physical Exam  Constitutional:       Appearance: She is obese.   Eyes:      Extraocular Movements: Extraocular movements intact.   Cardiovascular:      Rate and Rhythm: Normal rate and regular rhythm.      Pulses: Normal pulses. Pulmonary:      Effort: Pulmonary effort is normal.      Breath sounds: Normal breath sounds.   Abdominal:      General: Bowel sounds are normal.  Palpations: Abdomen is soft.   Skin:     General: Skin is warm and dry.   Neurological:      General: No focal deficit present.      Mental Status: She is alert and oriented to person, place, and time. Mental status is at baseline.   Psychiatric:         Mood and Affect: Mood normal.        Lab Review  24-hour labs:    Results for orders placed or performed during the hospital encounter of 04/02/23 (from the past 24 hours)   CBC    Collection Time: 04/05/23  9:45 AM   Result Value Ref Range    White Blood Cells 9.30 4.50 - 11.00 10*3/uL    Red Blood Cells 2.59 (L) 4.00 - 5.00 10*6/uL    Hemoglobin 7.8 (L) 12.0 - 15.0 g/dL    Hematocrit 96.2 (L) 36.0 - 45.0 %    MCV 89.2 80.0 - 100.0 fL    MCH 30.1 26.0 - 34.0 pg    MCHC 33.8 32.0 - 36.0 g/dL    RDW 95.2 (H) 84.1 - 15.0 %    Platelet Count 199 150 - 400 10*3/uL    MPV 8.3 7.0 - 11.0 fL   CBC    Collection Time: 04/05/23  4:42 PM   Result Value Ref Range    White Blood Cells 10.10 4.50 - 11.00 10*3/uL    Red Blood Cells 2.61 (L) 4.00 - 5.00 10*6/uL    Hemoglobin 7.8 (L) 12.0 - 15.0 g/dL    Hematocrit 32.4 (L) 36.0 - 45.0 %    MCV 87.2 80.0 - 100.0 fL    MCH 30.1 26.0 - 34.0 pg    MCHC 34.5 32.0 - 36.0 g/dL    RDW 40.1 (H) 02.7 - 15.0 %    Platelet Count 206 150 - 400 10*3/uL    MPV 8.1 7.0 - 11.0 fL   CBC    Collection Time: 04/06/23  3:30 AM   Result Value Ref Range    White Blood Cells 8.90 4.50 - 11.00 10*3/uL    Red Blood Cells 2.48 (L) 4.00 - 5.00 10*6/uL    Hemoglobin 7.4 (L) 12.0 - 15.0 g/dL    Hematocrit 25.3 (L) 36.0 - 45.0 %    MCV 88.8 80.0 - 100.0 fL    MCH 29.9 26.0 - 34.0 pg    MCHC 33.7 32.0 - 36.0 g/dL    RDW 66.4 (H) 40.3 - 15.0 %    Platelet Count 203 150 - 400 10*3/uL    MPV 8.0 7.0 - 11.0 fL   BASIC METABOLIC PANEL    Collection Time: 04/06/23  3:31 AM   Result Value Ref Range    Sodium 142 137 - 147 mmol/L    Potassium 3.3 (L) 3.5 - 5.1 mmol/L    Chloride 112 (H) 98 - 110 mmol/L    Glucose 145 (H) 70 - 100 mg/dL    Blood Urea Nitrogen 61 (H) 7 - 25 mg/dL    Creatinine 4.74 (H) 0.40 - 1.00 mg/dL    Calcium 8.1 (L) 8.5 - 10.6 mg/dL    CO2 19 (L) 21 - 30 mmol/L    Anion Gap 12 3 - 12    Glomerular Filtration Rate (GFR) 20 (L) >60 mL/min

## 2023-04-06 NOTE — Progress Notes
General Progress Note    Name:  Raven [name]   EXBMW'U Date:  04/05/2023  Admission Date: 04/02/2023  LOS: 3 days                     Assessment/Plan:    Principal Problem:    GI bleed    Siri Dreher is a 77 y.o. 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     Interval Updates:  -EGD with normal stomach, previous polypectomy and duodenum with Endo Clips present.  Ulcer visualized in duodenum with a visible vessel, no active bleeding.  Epinephrine injection utilized at pace with thermal therapy and mechanical therapy of Endo Clip placed.  -IV pantoprazole for 72 hours, twice daily PPI at discharge x8 weeks.  -Hold anticoagulation for 72 hours from 12/8  -Can resume Eliquis after 72 hours if hematochezia resolves  -Continuing clear liquid diet  -Planning for EUS in the next month for cystic mass in pancreas  -PT recs of home/prior situation  -Sleep bundle     # Acute on chronic symptomatic anemia (MM, CKD) (s/p 1 unit pRBC)  # Hematochezia  # Hematemesis (resolved)  # Chronic anticoagulation ?triple therapy (holding)  # Hx diverticulosis and sigmoid colon mass resection 07/2022  - 2 episodes of hematemesis morning of 12/6, 1 episode of melena 12/6 in ED  -transfused 2U PRBCs in ED for HGB of 6.9 (7.2 on 12/4)  -she is on Aranesp outpatient with H/O  - 12/2 EGD: 1.5 cm pedunculated friable polyp in duodenum s/p clip x2, no bleeding seen.   - 12/6 CT a/p: No acute abdominopelvic inflammatory mass or bowel obstruction   -EGD 12/8 with   Plan:  -EGD: Ulcer visualized in duodenum with a visible vessel, no active bleeding.  Epinephrine injection utilized at pace with thermal therapy and mechanical therapy of Endo Clip placed.  -IV pantoprazole for 72 hours, twice daily PPI at discharge x8 weeks.  -Hold anticoagulation for 72 hours from 12/8  -Can resume Eliquis after 72 hours if hematochezia resolves  -Clear liquid diet  -Transfuse as needed  -Sleep bundle     # CAD s/p CABG and PCI  # HFrEF 35%, ICM on milrinone infusion, CRT-D  # Afib   # Carotid artery Disease with CEA 2016  -pt reports being on triple therapy (asa, plavix, eliquis) unsure why this is the case according to previous notes  - Discharged on Eliquis monotherapy however she thinks she had been taking plavix and aspirin as well post 12/4 discharge  Plan:  - Hold all AC for time being  - Continue PTA amiodarone and beta-blocker and milrinone drip     # Leukocytosis (resolved)  -WBC 15.4 on admit  -Hx of leukocytosis on previous admission  -Pt is on dex 8mg  qweek prior to chemo.  -Afeb, no infectious sxs.   -Continue to monitor off antibiotics     # Multiple Myeloma  -[facility]s [facility]  -continue ppx acyclovir  -continue dex (this is weekly)  -on outpatient darzalex     # CKD4, stable  -Cr baseline variable. Around 2.3, Cr at discharge  12/4 was 2.61. eGFR ranges 18-25.  Plan:  > Cr: 2.31 12/8  > Daily lytes/renal fct  > Strict I/os  > CTM closely   > Avoid nephrotoxic agents     # DM2,  - Hold PTA [name] and Levemir  > LDCF  > Accuchek ACHS    # Pancreatic Cyst  -CT A/P 12/6: Mild increase in size of indeterminate large lobulated cystic mass in   the tail of pancreas. Endoscopic ultrasound recommended for further   evaluation.   Plan  -Planning for EUS in the next month for cystic mass in pancreas  -PT recs of home/prior situation    # subcentimeter nodular opacities in lungs  -CT A/P 12/6: Tree-in-bud and multiple subcentimeter nodular opacities are   indeterminate and may be from edema, atypical infection, and/or   aspiration. Short follow-up nonemergent CT chest in 6-8 weeks recommended   for reevaluation.       FEN: Clear liquid diet  Lines:  PIV  Urinary Catheter:  n/a  VTE: held in setting of GIB    Code status: Full Code  Disposition: Med1     Kathee Polite, DO  Internal Medicine PGY-1  _______________________________________________________    Subjective  Patient is doing well today.  She feels much better after her unit of blood, has more energy now.  She continues to have bloody bowel movements.    Medications  Scheduled Meds:acyclovir (ZOVIRAX) tablet 400 mg, 400 mg, Oral, BID  amiodarone (CORDARONE) tablet 200 mg, 200 mg, Oral, QDAY  budesonide-formoterol HFA (SYMBICORT) 160-4.5 mcg/actuation inhalation 2 puff, 2 puff, Inhalation, BID  carvediloL (COREG) tablet 3.125 mg, 3.125 mg, Oral, BID w/meals  insulin aspart (U-100) (NOVOLOG FLEXPEN U-100 INSULIN) injection PEN 0-6 Units, 0-6 Units, Subcutaneous, ACHS (22)  pantoprazole (PROTONIX) injection 40 mg, 40 mg, Intravenous, ZOX(09-60)  rosuvastatin (CRESTOR) tablet 10 mg, 10 mg, Oral, QDAY  sertraline (ZOLOFT) tablet 50 mg, 50 mg, Oral, QDAY    Continuous Infusions:   milrinone (PRIMACOR) 20 mg/D5W 100 mL infusion 0.3 mcg/kg/min (04/05/23 1643)     PRN and Respiratory Meds:acetaminophen Q4H PRN, albuterol sulfate Q4H PRN, dextrose 50% (D50) IV PRN, melatonin QHS PRN, ondansetron Q6H PRN **OR** ondansetron (ZOFRAN) IV Q6H PRN, polyethylene glycol 3350 QDAY PRN, sennosides-docusate sodium QDAY PRN    Objective:                        Vital Signs: Last Filed                 Vital Signs: 24 Hour Range   BP: 90/44 (12/09 1651)  Temp: 37.1 ?C (98.7 ?F) (12/09 1545)  Pulse: 87 (12/09 1651)  Respirations: 18 PER MINUTE (12/09 1545)  SpO2: 100 % (12/09 1545)  O2 Device: None (Room air) (12/09 1545) BP: (87-110)/(44-81)   Temp:  [36.4 ?C (97.5 ?F)-37.1 ?C (98.7 ?F)]   Pulse:  [86-96]   Respirations:  [16 PER MINUTE-18 PER MINUTE]   SpO2:  [95 %-100 %]   O2 Device: None (Room air) Intensity Pain Scale (Self Report): 8 (04/05/23 0146) Vitals:    04/02/23 1024 04/02/23 1612   Weight: 76.2 kg (167 lb 15.9 oz) 76.1 kg (167 lb 12.3 oz)       Intake/Output Summary:  (Last 24 hours)    Intake/Output Summary (Last 24 hours) at 04/05/2023 1925  Last data filed at 04/05/2023 0521  Gross per 24 hour   Intake 1676.58 ml   Output --  Net 1676.58 ml     Physical Exam  Constitutional:       Appearance: She is obese.   Eyes:      Extraocular Movements: Extraocular movements intact.   Cardiovascular:      Rate and Rhythm: Normal rate and regular rhythm.      Pulses: Normal pulses.   Pulmonary:      Effort: Pulmonary effort is normal.      Breath sounds: Normal breath sounds.   Abdominal:      General: Bowel sounds are normal.      Palpations: Abdomen is soft.   Skin:     General: Skin is warm and dry.   Neurological:      General: No focal deficit present.      Mental Status: She is alert and oriented to person, place, and time. Mental status is at baseline.   Psychiatric:         Mood and Affect: Mood normal.        Lab Review  24-hour labs:    Results for orders placed or performed during the hospital encounter of 04/02/23 (from the past 24 hours)   CBC    Collection Time: 04/05/23  1:57 AM   Result Value Ref Range    White Blood Cells 9.90 4.50 - 11.00 10*3/uL    Red Blood Cells 1.96 (L) 4.00 - 5.00 10*6/uL    Hemoglobin 5.8 (LL) 12.0 - 15.0 g/dL    Hematocrit 45.4 (L) 36.0 - 45.0 %    MCV 88.5 80.0 - 100.0 fL    MCH 29.4 26.0 - 34.0 pg    MCHC 33.2 32.0 - 36.0 g/dL    RDW 09.8 (H) 11.9 - 15.0 %    Platelet Count 226 150 - 400 10*3/uL    MPV 8.2 7.0 - 11.0 fL   BASIC METABOLIC PANEL    Collection Time: 04/05/23  1:57 AM   Result Value Ref Range    Sodium 142 137 - 147 mmol/L    Potassium 3.6 3.5 - 5.1 mmol/L    Chloride 111 (H) 98 - 110 mmol/L    Glucose 203 (H) 70 - 100 mg/dL    Blood Urea Nitrogen 69 (H) 7 - 25 mg/dL    Creatinine 1.47 (H) 0.40 - 1.00 mg/dL    Calcium 7.9 (L) 8.5 - 10.6 mg/dL    CO2 20 (L) 21 - 30 mmol/L    Anion Gap 11 3 - 12    Glomerular Filtration Rate (GFR) 20 (L) >60 mL/min   CBC    Collection Time: 04/05/23  9:45 AM   Result Value Ref Range    White Blood Cells 9.30 4.50 - 11.00 10*3/uL    Red Blood Cells 2.59 (L) 4.00 - 5.00 10*6/uL    Hemoglobin 7.8 (L) 12.0 - 15.0 g/dL    Hematocrit 82.9 (L) 36.0 - 45.0 %    MCV 89.2 80.0 - 100.0 fL    MCH 30.1 26.0 - 34.0 pg    MCHC 33.8 32.0 - 36.0 g/dL    RDW 56.2 (H) 13.0 - 15.0 %    Platelet Count 199 150 - 400 10*3/uL    MPV 8.3 7.0 - 11.0 fL   CBC    Collection Time: 04/05/23  4:42 PM   Result Value Ref Range    White Blood Cells 10.10 4.50 - 11.00 10*3/uL    Red Blood Cells 2.61 (L) 4.00 - 5.00 10*6/uL    Hemoglobin 7.8 (L)  12.0 - 15.0 g/dL    Hematocrit 98.1 (L) 36.0 - 45.0 %    MCV 87.2 80.0 - 100.0 fL    MCH 30.1 26.0 - 34.0 pg    MCHC 34.5 32.0 - 36.0 g/dL    RDW 19.1 (H) 47.8 - 15.0 %    Platelet Count 206 150 - 400 10*3/uL    MPV 8.1 7.0 - 11.0 fL

## 2023-04-06 NOTE — Case Management (ED)
Case Management Progress Note    NAME:Raven [name]                          MRN: [id number]              DOB:[birth year]          AGE: 77 y.o.  ADMISSION DATE: 04/02/2023             DAYS ADMITTED: LOS: 4 days      Today's Date: 04/06/2023    PLAN: To discharge home when medically stable.     Expected Discharge Date: 04/09/2023   Is Patient Medically Stable: No, Please explain: bleeding  Are there Barriers to Discharge? no    INTERVENTION/DISPOSITION:  Discharge Planning              Discharge Planning: Home Health, Home Infusion-Enteral-TPN  Patient active with [facility] and [facility] for milrinone gtt. NCM spoke with HH intake and notified of admission. NCM received voalte from [facility].   NCM to fax [facility] orders at discharge.   Transportation              Does the Patient Need Case Management to Arrange Discharge Transport? (ex: facility, ambulance, wheelchair/stretcher, Medicaid, cab, other): No  Will the Patient Use Family Transport?: Yes  Transportation Name, Phone and Availability #1: [name] (son) [phone]  Support               Reviewed EMR and attended huddle.     Info or Referral                 Positive SDOH Domains and Potential Barriers                   Medication Needs                              Financial                 Legal                 Other                 Discharge Disposition                       Selected Continued Care - Admitted Since 04/02/2023       [facility]       Service Provider Services Address Phone Fax Patient Preferred    [facility] [address] [place] [address] [phone] --              [facility]       Service Provider Services Address Phone Fax Patient Preferred    [facility] Home Infusion and Injection [address], [place] [address] [phone] --                Alene Mires, RN, BSN Nurse Case Manager  Available on [place]

## 2023-04-06 NOTE — Progress Notes
OCCUPATIONAL THERAPY  PROGRESS NOTE      Name: Raven [name]   MRN: [id number]     DOB: Oct 31, 1945      Age: 77 y.o.  Admission Date: 04/02/2023     LOS: 4 days     Date of Service: 04/06/2023      Mobility  Patient Turn/Position: Chair  Progressive Mobility Level: Walk in room  Distance Walked (feet): 30 ft  Level of Assistance: Assist X1  Assistive Device: Walker  Activity Limited By: Fatigue    Subjective  Reason for Admission and Past Medical Hx: 77 y.o. 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  Significant Hospital Events: admitted for hematemesis, melena and general weakness  Mental / Cognitive: Alert;Oriented  Pain: No complaint of pain    Home Living Situation  Lives With: Family  Type of Home: House  Entry Stairs: No stairs  In-Home Stairs: 1 flight;Stair glide  Bathroom Setup: Walk in shower  Patient Owned Equipment: Bathing: 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)    Prior Level of Function  Level Of Independence: Independent with ADL and community mobility with device  Required Assist For: All personal care ADL;All home functioning ADL  History of Falls in Past 3 Months: No    Vision  Tracking: Within Functional Limits    ADL's  Where Assessed: Edge of Bed  LE Dressing Assist: Minimal Assist  LE Dressing Deficits: Don/Doff R Sock;Don/Doff L Sock  Comment: patient pleasantly declines all other ADLs this date, eager to transfer to chair to watch niece's graduation ceremony    ADL Mobility  Bed Mobility: Supine to Sit: Standby assist  Transfer Type: Sit to stand  Transfer: Assistance Level: From;Bed;Minimal assist  Transfer: Assistive Device: Agricultural consultant: Type of Assistance: For safety considerations;For balance  Other Transfer Type: Stand to sit  Other Transfer: Assistance Level: To;Bedside chair;Standby assist  Other Transfer: Assistive Device: Roller walker  Other Transfer: Type of Assistance: For safety considerations  End of Activity Status: Up in chair;Instructed patient to request assist with mobility;Instructed patient to use call light;Nursing notified  Sitting Balance: Standby assist;No UE support  Standing Balance: 2 UE support;Standby assist  Gait Distance: 30 feet  Gait: Assistance Level: Standby assist  Gait: Assistive Device: Roller walker  Gait Comments: steady gait, no LOB.    Activity Tolerance  Endurance: 3/5 Tolerates 25-30 Minutes Exercise w/Multiple Rests    Cognition  Overall Cognitive Status: WFL to Adequately Complete Self Care Tasks Safely  Comprehension: WFL to Adequately Complete Self Care Tasks Safely  Orientation: Alert & Oriented x4  Attention: Awake/Alert    ROM  R UE ROM: WFL   R UE ROM Method: Active  L UE ROM: WFL   L UE ROM Method: Active  R LE ROM: WFL  L LE ROM: WFL    Education  Persons Educated: Patient  Teaching Methods: Verbal Instruction  Patient Response: Verbalized Understanding  Topics: Role of OT, Goals for Therapy;Home safety;ADL Compensatory Techniques;Adaptive Devices for ADLs  Goal Formulation: With Patient    Assessment  Assessment: Decreased ADL Status;Decreased Endurance;Decreased Self-Care Trans;Decreased High-Level ADLs  Prognosis: Good  Goal Formulation:  Patient  Comments: Patient presents with decreased mobility, decreased BUE strength, decreased gross motor coordination and decreased activity tolerance to perform ADLs independently. Patient would benefit from continued OT intervention in acute care setting to increase OOB activities.    AM-PAC 6 Clicks Daily Activity Inpatient  Putting on and taking off regular lower body clothes: A Little  Bathing (Including washing, rinsing, drying): A Little  Toileting, which includes using toilet, bedpan, or urinal: A Little  Putting on and taking off regular upper body clothing: None  Taking care of personal grooming such as brushing teeth: None  Eating meals: None  Daily Activity Raw Score: 21  Standardized (T-scale) Score: 44.27    Plan  OT Frequency: 5x/week  OT Plan for Next Visit: progress mobility, toilet transfers    ADL Goals  Patient Will Perform All ADL's: w/ Modified Independence    Functional Transfer Goals  Pt Will Perform All Functional Transfers: Modified Independent    OT Discharge Recommendations  Recommendation: Home/prior living situation;Home with intermittent supervision/assistance  Patient Currently Requires Physical Assist With: All personal care ADLs;All home functioning ADLs  Patient Currently Requires Equipment: None;Owns what is needed    Therapist: Chari Manning, OTD, OTR/L  Date: 04/06/2023

## 2023-04-06 NOTE — Progress Notes
Gastroenterology Consultation - Follow Up      HISTORY OF PRESENT ILLNESS  Raven [name] is a pleasant 77 y.o. 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 She was recently here two weeks ago for melena and had a large friable duodenal polyp resected by hot snare and 2 clips placed. This was the presumed source of GI bleed. She then resumed ASA, Plavix, and Eliquis when she left and presented with recurrent melena and hematemesis. GI consulted for further evaluation and performed EGD on 12/9 - full report below.      24 Hour Events/Subjective:   Hgb 7.4 this AM, 7.8 yesterday. Patient denies any abdominal pain or nausea. No stools today thus far.     REVIEW OF MEDICAL RECORDS  Past Medical History:   Diagnosis Date    Anxiety     Arthritis     Asthma     Asymptomatic stenosis of right carotid artery     Back pain     Cancer of uterus (HCC)     COPD (chronic obstructive pulmonary disease) (HCC)     Coronary artery disease     Diabetes mellitus (HCC)     Dyslipidemia     Flank mass     Fluid retention     Heart disease     Hypertension     Other malignant neoplasm without specification of site     Sleep apnea     Compliant w CPAP    Vision decreased      Surgical History:   Procedure Laterality Date    ROBOT ASSISTED LOW ANTERIOR RESECTION N/A 08/10/2022    Performed by Benetta Spar, MD at [facility] OR    ESOPHAGOGASTRODUODENOSCOPY WITH BIOPSY - FLEXIBLE N/A 09/17/2022    Performed by Tempie Hoist, DO at [facility] ENDO    CATHETERIZATION RIGHT HEART N/A 09/18/2022    Performed by Cath, Physician at [facility] CATH LAB    REMOVAL AND REPLACEMENT IMPLANTABLE DEFIBRILLATOR GENERATOR - SINGLE LEAD SYSTEM Left 11/04/2022    Performed by Kathreen Cornfield, MD at [facility] EP LAB    INSERTION/ REPLACEMENT PERMANENT PACEMAKER WITH ATRIAL LEAD Left 11/04/2022    Performed by Kathreen Cornfield, MD at [facility] EP LAB    Injection Venography Extremity Left 11/04/2022    Performed by Kathreen Cornfield, MD at [facility] EP LAB    ESOPHAGOGASTRODUODENOSCOPY WITH BIOPSY - FLEXIBLE N/A 03/29/2023    Performed by Buckles, Vinnie Level, MD at [facility] ENDO    ESOPHAGOGASTRODUODENOSCOPY WITH SNARE REMOVAL TUMOR/ POLYP/ OTHER LESION - FLEXIBLE N/A 03/29/2023    Performed by Buckles, Vinnie Level, MD at [facility] ENDO    ESOPHAGOGASTRODUODENOSCOPY WITH CONTROL OF BLEEDING - FLEXIBLE N/A 04/04/2023    Performed by Buckles, Vinnie Level, MD at [facility] ENDO    BONE MARROW BIOPSY      CARDIAC DEFIBRILLATOR PLACEMENT      [employer]    CARDIAC SURGERY      CABG 3 V    CAROTID ENDARDECTOMY Right     COLONOSCOPY      HX CORONARY STENT PLACEMENT      HX HEART CATHETERIZATION  HX HYSTERECTOMY      TUNNELED VENOUS PORT PLACEMENT Right     Chest     Social History     Socioeconomic History    Marital status: Widowed   Tobacco Use    Smoking status: Former     Current packs/day: 0.00     Average packs/day: 1 pack/day for 20.0 years (20.0 ttl pk-yrs)     Types: Cigarettes     Start date: 16     Quit date: 2010     Years since quitting: 14.9     Passive exposure: Never    Smokeless tobacco: Never   Vaping Use    Vaping status: Never Used   Substance and Sexual Activity    Alcohol use: Not Currently    Drug use: Not Currently     Allergies   Allergen Reactions    Bortezomib RASH     Noted in Provider note 8/22 cancer treatment drug    Allopurinol HIVES    Sulfa (Sulfonamide Antibiotics) NAUSEA ONLY     Current Facility-Administered Medications   Medication Dose Route Frequency Provider Last Rate Last Admin    acetaminophen (TYLENOL) tablet 650 mg  650 mg Oral Q4H PRN Bethann Humble, MD   650 mg at 04/05/23 2113 acyclovir (ZOVIRAX) tablet 400 mg  400 mg Oral BID Bethann Humble, MD   400 mg at 04/06/23 0839    albuterol sulfate (PROAIR HFA) inhaler 2 puff  2 puff Inhalation Q4H PRN Bethann Humble, MD        amiodarone (CORDARONE) tablet 200 mg  200 mg Oral Fawn Kirk, Sam, MD   200 mg at 04/06/23 0839    budesonide-formoterol HFA (SYMBICORT) 160-4.5 mcg/actuation inhalation 2 puff  2 puff Inhalation BID Bethann Humble, MD   2 puff at 04/05/23 2113    carvediloL (COREG) tablet 3.125 mg  3.125 mg Oral BID w/meals Bethann Humble, MD   3.125 mg at 04/05/23 0841    dextrose 50% (D50) syringe 25-50 mL  12.5-25 g Intravenous PRN Bethann Humble, MD        insulin aspart (U-100) (NOVOLOG FLEXPEN U-100 INSULIN) injection PEN 0-6 Units  0-6 Units Subcutaneous ACHS (22) Detwiler, Sam, MD   3 Units at 04/05/23 1704    melatonin tablet 5 mg  5 mg Oral QHS PRN Bethann Humble, MD   5 mg at 04/05/23 2113    milrinone (PRIMACOR) 20 mg/D5W 100 mL infusion  0.3 mcg/kg/min (Dosing Weight) Intravenous Continuous Detwiler, Sam, MD 7 mL/hr at 04/06/23 0729 0.3 mcg/kg/min at 04/06/23 0729    ondansetron (ZOFRAN ODT) rapid dissolve tablet 4 mg  4 mg Oral Q6H PRN Bethann Humble, MD        Or    ondansetron HCL (PF) (ZOFRAN (PF)) injection 4 mg  4 mg Intravenous Q6H PRN Margaretmary Bayley, Sam, MD   4 mg at 04/05/23 0146    pantoprazole (PROTONIX) injection 40 mg  40 mg Intravenous UEA(54-09) Detwiler, Sam, MD   40 mg at 04/05/23 2113    polyethylene glycol 3350 (MIRALAX) packet 17 g  1 packet Oral QDAY PRN Bethann Humble, MD        rosuvastatin (CRESTOR) tablet 10 mg  10 mg Oral Fawn Kirk, Sam, MD   10 mg at 04/06/23 8119    sennosides-docusate sodium (SENOKOT-S) tablet 1 tablet  1 tablet Oral QDAY PRN Bethann Humble, MD        sertraline (ZOLOFT) tablet 50 mg  50 mg Oral QDAY Detwiler, Sam,  MD   50 mg at 04/06/23 8469     I personally reviewed past medical history, family history, and social history.     REVIEW OF SYSTEMS  HEENT: No loss of vision or bleeding gums   Cardiovascular: No chest pain    Respiratory: No wheezing   Gastrointestinal: See HPI above.     PHYSICAL EXAMINATION  Vital Signs: BP (!) 88/32 Comment: RN notified - Pulse 82  - Temp 36.6 ?C (97.8 ?F)  - Ht 157.5 cm (5' 2)  - Wt 75.9 kg (167 lb 5.3 oz)  - SpO2 96%  - BMI 30.60 kg/m?   Body mass index is 30.6 kg/m?Marland Kitchen  Physical Exam  Constitutional:       General: She is awake.   Cardiovascular:      Rate and Rhythm: Normal rate.   Pulmonary:      Effort: Pulmonary effort is normal. No respiratory distress.   Abdominal:      General: Bowel sounds are normal. There is no distension.      Palpations: Abdomen is soft.      Tenderness: There is no abdominal tenderness.   Neurological:      Mental Status: She is alert.   Psychiatric:         Mood and Affect: Mood normal.         Speech: Speech normal.        RECENT LABS - Pertinent labs reviewed by me personally and abbreviated in HPI above.     RADIOLOGY - I have personally reviewed the following pertinent radiology images and/or reports:     CT AP WO Contrast (04/02/2023)  1. No acute abdominopelvic inflammatory mass or bowel obstruction.   2. Distal colonic anastomosis. Mild distal colonic diverticulosis.   3. Mild increase in size of indeterminate large lobulated cystic mass in the tail of pancreas. Endoscopic ultrasound recommended for further evaluation.   4. Partially visualized mild cardiomegaly. Mild interlobular septal thickening with trace right pleural effusion suggesting CHF/fluid overload. Tree-in-bud and multiple subcentimeter nodular opacities are indeterminate and may be from edema, atypical infection, and/or aspiration. Short follow-up nonemergent CT chest in 6-8 weeks recommended for reevaluation.     GI PROCEDURES - I have personally reviewed the following pertinent GI procedure images and/or reports:     EGD (04/04/2023)  - Z-line regular, 37 cm from the incisors.   - Esophagogastric landmarks identified.   - Normal stomach.   - At the D1-D2 junction there was the site of previous polypectomy with one endoclip on it and the other endoclip one fold distal to it. At the tip of the post polypectomy site was an ulcer with a visible vessel [facility]). There was no active bleeding. Treated with epinephrine injection at the base, thermal therapy using gold probe and mechanical therapy using one endoclip.     EGD (03/29/2023)  - Normal esophagus   - 3 cm type-I sliding hiatal hernia.   - Normal stomach.   - A few duodenal polyps.   - 1.5 cm pedunculated duodenal polyp. Resected and retrieved. Two clips (MR conditional) were placed. Clip manufacturer: [employer].      EGD (09/17/2022)  - Normal esophagus.   - Z-line regular, 37 cm from the incisors.   - Gastroesophageal flap valve classified as Hill Grade III (minimal fold, loose to endoscope, hiatal hernia likely).   - 3 cm hiatal hernia.   - Congestive gastropathy.   - Erythematous mucosa in the prepyloric region  of the stomach. Biopsied.   - A single 6 mm sessile polyp with no bleeding and no stigmata of recent bleeding was found in the gastric body.   - A single 6 mm sessile polyp with no bleeding was found in the first portion of the duodenum.   - A single 15 mm pedunculated polyp with no bleeding was found in the second portion of the duodenum.   - Polyps were not removed today due to last plavix use 5/21.      Final Pathology Diagnosis:   A. Gastric oxyntic and antral-type mucosa, gastric biopsy r/o H pylori, biopsy:   Oxyntic-type mucosa with focally active gastritis.   Antral-type mucosa with features of reactive gastropathy.   Immunohistochemical stain for H. pylori is negative.     ASSESSMENT AND PLAN  Hematochezia - resolved  Melena - resolved  Severe mitral valve regurgitation  Anemia requiring transfusion  Hx of sigmoid colon mass resection in April 2024  Multiple myeloma on Daratumumab  CKD4  Hematemesis - resolved  HFrEF - on Milrinone    Recommendations:  - Okay to transition to IV PPI BID tomorrow. Continue this medication for 8 weeks.   - Okay to resume Eliquis tomorrow from GI standpoint  - Monitor for recurrent bleeding  - Trend H/H, transfuse pRBCs per protocol if Hgb <7gm/dl   - Okay to advance diet   - No further inpatient endoscopic evaluation planned at this time   - Notify us immediately if patient experiences brisk GI bleeding and/or a sudden non-dilutional 2 gm drop in Hgb   - Will plan for EUS in the next month for cystic mass in pancreas as patient just had GIB (message sent for scheduling)    Total Time Today was 35 minutes in the following activities: Preparing to see the patient, Obtaining and/or reviewing separately obtained history, Performing a medically appropriate examination and/or evaluation, Counseling and educating the patient/family/caregiver, Documenting clinical information in the electronic or other health record, Independently interpreting results (not separately reported) and communicating results to the patient/family/caregiver, and Care coordination (not separately reported)    Patient discussed and plan agreed upon with Dr. Reina Fuse. GI will sign off. Thank you for this consultation. It has been a pleasure to be a part of the care team. Please feel free to contact the GI consult team with any questions or concerns.    Epifanio Lesches, MSN, APRN, FNP-C  [facility]  Division of Gastroenterology  Available via [place]  [phone], otherwise contact GI fellow on call

## 2023-04-07 ENCOUNTER — Encounter: Admit: 2023-04-07 | Discharge: 2023-04-07 | Payer: MEDICARE

## 2023-04-07 LAB — HEMOGLOBIN & HEMATOCRIT
~~LOC~~ BKR HEMATOCRIT: 22 % — ABNORMAL LOW (ref 36.0–45.0)
~~LOC~~ BKR HEMOGLOBIN: 7.7 g/dL — ABNORMAL LOW (ref 12.0–15.0)

## 2023-04-07 MED ORDER — APIXABAN 5 MG PO TAB
5 mg | Freq: Two times a day (BID) | ORAL | 0 refills | Status: DC
Start: 2023-04-07 — End: 2023-04-09
  Administered 2023-04-08 – 2023-04-09 (×4): 5 mg via ORAL

## 2023-04-07 MED ORDER — PANTOPRAZOLE 40 MG PO TBEC
40 mg | Freq: Two times a day (BID) | ORAL | 0 refills | Status: DC
Start: 2023-04-07 — End: 2023-04-09
  Administered 2023-04-08 – 2023-04-09 (×4): 40 mg via ORAL

## 2023-04-07 NOTE — Progress Notes
OCCUPATIONAL THERAPY  PROGRESS NOTE      Name: Raven [name]   MRN: [id number]     DOB: 13-Mar-1946      Age: 77 y.o.  Admission Date: 04/02/2023     LOS: 5 days     Date of Service: 04/07/2023      Mobility  Patient Turn/Position: Chair  Progressive Mobility Level: Walk in hallway  Distance Walked (feet): 125 ft  Level of Assistance: Assist X1  Assistive Device: Walker  Activity Limited By: Fatigue    Subjective  Significant Hospital Events: 77 y.o. 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  Mental / Cognitive: Alert;Oriented;Cooperative  Pain: No complaint of pain    Home Living Situation  Lives With: Family  Type of Home: House  Entry Stairs: No stairs  In-Home Stairs: 1 flight;Stair glide  Bathroom Setup: Walk in shower  Patient Owned Equipment: Bathing: 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)    Prior Level of Function  Level Of Independence: Independent with ADL and community mobility with device  Required Assist For: All personal care ADL;All home functioning ADL  History of Falls in Past 3 Months: No    Vision  Tracking: Within Functional Limits    ADL's  Where Assessed: Edge of Bed;Chair  Eating Assist: Stand By Assist  Eating Deficits: Setup  Grooming Assist: Minimal Assist  Grooming Deficits: Wash/Dry Hands;Wash/Dry Face  LE Dressing Assist: Minimal Assist  LE Dressing Deficits: Don/Doff R Sock;Don/Doff L Sock    ADL Mobility  Bed Mobility: Supine to Sit: Standby assist  Transfer Type: Sit to/from stand  Transfer: Assistance Level: To/from;Bed;Minimal assist  Transfer: Assistive Device: Agricultural consultant: Type of Assistance: For safety considerations;For balance  Other Transfer Type: Sit to/from stand  Other Transfer: Assistance Level: To/from;Bedside chair;Minimal assist  Other Transfer: Assistive Device: Roller walker  Other Transfer: Type of Assistance: For safety considerations  End of Activity Status: Up in chair;Instructed patient to request assist with mobility;Instructed patient to use call light;Nursing notified  Sitting Balance: Standby assist;No UE support  Standing Balance: 2 UE support;Standby assist  Gait Distance: 125 feet  Gait: Assistance Level: Standby assist  Gait: Assistive Device: Roller walker  Gait Comments: steady gait, no LOB. x1 seated rest break      Activity Tolerance  Endurance: 3/5 Tolerates 25-30 Minutes Exercise w/Multiple Rests    Cognition  Overall Cognitive Status: WFL to Adequately Complete Self Care Tasks Safely  Comprehension: WFL to Adequately Complete Self Care Tasks Safely  Orientation: Alert & Oriented x4  Attention: Awake/Alert    ROM  R UE ROM: WFL   R UE ROM Method: Active  L UE ROM: WFL   L UE ROM Method: Active  R LE ROM: WFL  L LE ROM: WFL      UE Strength / Tone  Strength Position Assessed: Seated  R UE Strength: WFL  L UE Strength: WFL  R LE Strength: WFL  L LE Strength: WFL    Education  Persons Educated: Patient  Teaching Methods: Verbal Instruction  Patient Response: Verbalized Understanding  Topics: Role of OT, Goals for Therapy;Home safety;ADL Compensatory Techniques;Adaptive Devices for  ADLs  Goal Formulation: With Patient    Assessment  Assessment: Decreased ADL Status;Decreased Endurance;Decreased Self-Care Trans;Decreased High-Level ADLs  Prognosis: Good  Goal Formulation: Patient  Comments: Patient presents with decreased mobility, decreased BUE strength, decreased gross motor coordination and decreased activity tolerance to perform ADLs independently. Patient would benefit from continued OT intervention in acute care setting to increase OOB activities.    AM-PAC 6 Clicks Daily Activity Inpatient  Putting on and taking off regular lower body clothes: A Little  Bathing (Including washing, rinsing, drying): A Little  Toileting, which includes using toilet, bedpan, or urinal: A Little  Putting on and taking off regular upper body clothing: None  Taking care of personal grooming such as brushing teeth: None  Eating meals: None  Daily Activity Raw Score: 21  Standardized (T-scale) Score: 44.27    Plan  OT Frequency: 3-5x/week  OT Plan for Next Visit: progress mobility, toilet transfers; endurance edu  Mobility Aide: Rehabilitation technician to assist with ongoing mobilization, ADLs and/or exercise per instructions of licensed therapy staff      ADL Goals  Patient Will Perform All ADL's: w/ Modified Independence    Functional Transfer Goals  Pt Will Perform All Functional Transfers: Modified Independent    OT Discharge Recommendations  Recommendation: Home/prior living situation  Recommendation for Therapy Post Discharge: Home health  Patient Currently Requires Equipment: Owns what is needed    Therapist: Osker Mason, OTR/L  Date: 04/07/2023

## 2023-04-07 NOTE — Progress Notes
PHYSICAL THERAPY  MOBILITY NOTE      Name: Raven [name]   MRN: [id number]     DOB: [birth year]      Age: 77 y.o.  Admission Date: 04/02/2023     LOS: 5 days     Date of Service: 04/07/2023      Patient was assigned for activity with the rehabilitation technician by the supervising therapist.  Patient declined to participate despite encouragement. Patient said they walked earlier and wanted tech to come back tomorrow.    Rehab Technician: Carilyn Goodpasture  Date: 04/07/2023

## 2023-04-07 NOTE — Progress Notes
Med 1 Progress Note    Name:  Raven [name]   [id number] Date:  04/07/2023  Admission Date: 04/02/2023  LOS: 5 days                     Assessment/Plan:    Principal Problem:    GI bleed    Raven [name] is a 77 y.o. 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     Interval Updates:  -Hemoglobin stable at 7.7 to 7.8 today  -Continued melena  -Discontinued pantoprazole IV, started pantoprazole 40 mg oral BID     # Acute on chronic symptomatic anemia (MM, CKD) (s/p 2 unit pRBC)  # Melena  # Hematemesis (resolved)  # Chronic anticoagulation   # Hx diverticulosis and sigmoid colon mass resection 07/2022  - 2 episodes of hematemesis 12/6, 1 episode of melena 12/6 in ED  -transfused 2U PRBCs in ED for HGB of 6.9 (7.2 on 12/4)  -On Aranesp outpatient with H/O  - 12/2 EGD: 1.5 cm friable polyp in duodenum s/p clip x2, no bleeding seen  - 12/6 CT a/p: No acute inflammatory mass or bowel obstruction   -12/8 EGD with ulcer in duodenum and visible vessel, no active bleeding.  Epinephrine injection utilized at pace with thermal therapy and mechanical therapy of Endo Clip placed.  -Received 2 units pRBC 12/9  Plan:  -Hemoglobin stable at 7.7 to 7.8 today  -Continued melena, transfuse if Hgb <7  -A little discontinued pantoprazole IV, started pantoprazole 40 mg oral BID  -Holding eliquis     # CAD s/p CABG and PCI  # HFrEF 35%, ICM on milrinone infusion, CRT-D  # Hx of Afib   # Carotid artery Disease with CEA 2016  -pt reports being on triple therapy (asa, plavix, eliquis) unsure why this is the case according to previous notes  - Discharged on Eliquis monotherapy however she thinks she had been taking plavix and aspirin as well post 12/4 discharge  Plan:  - Holding eliquis  - Continue PTA amiodarone and beta-blocker and milrinone drip     # Leukocytosis (resolved)  -WBC 15.4 on admit  -Hx of leukocytosis on previous admission     # Multiple Myeloma  -[facility]s [facility], on outpatient darzalex  -continue ppx acyclovir    # CKD4, stable  -Cr baseline variable. Around 2.3, Cr at discharge 12/4 was 2.61. eGFR ranges 18-25.  Plan  -Avoid nephrotoxic agents     # DM2,  - Hold PTA [name] and Levemir  -LDCF  -Accuchek ACHS    # Pancreatic Cyst  -CT A/P 12/6: Mild increase in size of indeterminate large lobulated cystic mass in   the tail of pancreas. Endoscopic ultrasound recommended for further   evaluation.   Plan  -Planning for EUS in the next month for cystic mass in pancreas  -PT recs of home/prior situation    # Subcentimeter nodular opacities in lungs  -CT A/P 12/6: Tree-in-bud and multiple subcentimeter nodular opacities are   indeterminate.   Plan  -Follow-up CT chest in 6-8 weeks recommended for reevaluation.       FEN: full  liquid  Lines:  PIV  Urinary Catheter:  n/a  VTE: held in setting of GIB, SCDs    Code status: Full Code  Disposition: Med1     Kathee Polite, DO  Internal Medicine PGY-1  _______________________________________________________    Subjective  Patient feels well today and has no concerns at present. Still having dark black stools.     Medications  Scheduled Meds:acyclovir (ZOVIRAX) tablet 400 mg, 400 mg, Oral, BID  amiodarone (CORDARONE) tablet 200 mg, 200 mg, Oral, QDAY  budesonide-formoterol HFA (SYMBICORT) 160-4.5 mcg/actuation inhalation 2 puff, 2 puff, Inhalation, BID  carvediloL (COREG) tablet 3.125 mg, 3.125 mg, Oral, BID w/meals  insulin aspart (U-100) (NOVOLOG FLEXPEN U-100 INSULIN) injection PEN 0-6 Units, 0-6 Units, Subcutaneous, ACHS (22)  rosuvastatin (CRESTOR) tablet 10 mg, 10 mg, Oral, QDAY  sertraline (ZOLOFT) tablet 50 mg, 50 mg, Oral, QDAY    Continuous Infusions:   milrinone (PRIMACOR) 20 mg/D5W 100 mL infusion 0.3 mcg/kg/min (04/07/23 1540)     PRN and Respiratory Meds:acetaminophen Q4H PRN, albuterol sulfate Q4H PRN, dextrose 50% (D50) IV PRN, melatonin QHS PRN, ondansetron Q6H PRN **OR** ondansetron (ZOFRAN) IV Q6H PRN, polyethylene glycol 3350 QDAY PRN, sennosides-docusate sodium QDAY PRN    Objective:                        Vital Signs: Last Filed                 Vital Signs: 24 Hour Range   BP: 117/64 (12/11 1505)  Temp: 36.8 ?C (98.2 ?F) (12/11 1505)  Pulse: 96 (12/11 1505)  Respirations: 18 PER MINUTE (12/11 1505)  SpO2: 97 % (12/11 1505)  O2 Device: None (Room air) (12/11 1505) BP: (97-117)/(58-69)   Temp:  [36.4 ?C (97.5 ?F)-37 ?C (98.6 ?F)]   Pulse:  [88-96]   Respirations:  [18 PER MINUTE-19 PER MINUTE]   SpO2:  [97 %-100 %]   O2 Device: None (Room air)     Vitals:    04/02/23 1024 04/02/23 1612 04/06/23 0200   Weight: 76.2 kg (167 lb 15.9 oz) 76.1 kg (167 lb 12.3 oz) 75.9 kg (167 lb 5.3 oz)       Intake/Output Summary:  (Last 24 hours)    Intake/Output Summary (Last 24 hours) at 04/07/2023 1546  Last data filed at 04/07/2023 0623  Gross per 24 hour   Intake 620 ml   Output --   Net 620 ml     Physical Exam  Constitutional:       Appearance: She is obese.   Eyes:      Extraocular Movements: Extraocular movements intact.   Cardiovascular:      Rate and Rhythm: Normal rate and regular rhythm.      Pulses: Normal pulses.   Pulmonary:      Effort: Pulmonary effort is normal.      Breath sounds: Normal breath sounds.   Abdominal:      General: Bowel sounds are normal.      Palpations: Abdomen is soft.   Skin:     General: Skin is warm and dry.   Neurological:      General: No focal deficit present.      Mental Status: She is alert and oriented to person, place, and time. Mental status is at baseline.   Psychiatric:         Mood and Affect: Mood normal.        Lab Review  24-hour labs: Results  for orders placed or performed during the hospital encounter of 04/02/23 (from the past 24 hours)   BASIC METABOLIC PANEL    Collection Time: 04/07/23  4:28 AM   Result Value Ref Range    Sodium 142 137 - 147 mmol/L    Potassium 4.1 3.5 - 5.1 mmol/L    Chloride 113 (H) 98 - 110 mmol/L    Glucose 172 (H) 70 - 100 mg/dL    Blood Urea Nitrogen 48 (H) 7 - 25 mg/dL    Creatinine 7.56 (H) 0.40 - 1.00 mg/dL    Calcium 7.8 (L) 8.5 - 10.6 mg/dL    CO2 18 (L) 21 - 30 mmol/L    Anion Gap 11 3 - 12    Glomerular Filtration Rate (GFR) 23 (L) >60 mL/min   CBC    Collection Time: 04/07/23  4:28 AM   Result Value Ref Range    White Blood Cells 10.80 4.50 - 11.00 10*3/uL    Red Blood Cells 2.59 (L) 4.00 - 5.00 10*6/uL    Hemoglobin 7.8 (L) 12.0 - 15.0 g/dL    Hematocrit 43.3 (L) 36.0 - 45.0 %    MCV 89.9 80.0 - 100.0 fL    MCH 30.3 26.0 - 34.0 pg    MCHC 33.7 32.0 - 36.0 g/dL    RDW 29.5 (H) 18.8 - 15.0 %    Platelet Count 227 150 - 400 10*3/uL    MPV 8.0 7.0 - 11.0 fL   HEMOGLOBIN & HEMATOCRIT    Collection Time: 04/07/23 12:20 PM   Result Value Ref Range    Hemoglobin 7.7 (L) 12.0 - 15.0 g/dL    Hematocrit 41.6 (L) 36.0 - 45.0 %

## 2023-04-07 NOTE — Progress Notes
Night 1900-0700    Pain: Pain is controlled without any medications    Nutrition: Diet- Diet: Full Liquid    Activity: Ambulation Episodes: assist x1    Incentive Spirometer this shift: No  Max Volume: n/a    Acute Events, Nursing Intervention or Provider Communication: pt stil on milirinone    Patient Interventions and Education  Fall Risk/JHFRAT  Fall Risk Score: Total Fall Risk Score: 13.    The following interventions were implemented: Remembering to call for assistance with ambulation, Assistance managing patient care equipment, Bed/chair alarm, and Fall identifiers in place, patient Verbalizes Understanding of fall risk factors and interventions. Patient compliance: Patient compliant with interventions.    Restraints: No  Restraints Goal: choice: N/A  See Docflowsheet for restraint documentation, interventions, education, etc.    Intake and Output:      Date 04/06/23 0701 - 04/07/23 0700 04/07/23 0701 - 04/08/23 0700   Shift 0701-1900 1901-0700 24 Hour Total 0701-1900 1901-0700 24 Hour Total   INTAKE   P.O. 1244  1244      I.V.(mL/kg/hr) 258.4(0.3)  258.4      Shift Total(mL/kg) 1502.4(19.8)  1502.4(19.8)      OUTPUT   Other           Stool Occurrence 1 x 1 x 2 x        Urine Occurrence  2 x 1 x 3 x      Shift Total(mL/kg)         NET 1502.4  1502.4      Weight (kg) 75.9 75.9 75.9 75.9 75.9 75.9         This RN provided education to patient. The following education topics were reviewed with patient: Other Milirinone gtt  Patient Verbalizes Understanding     Discharge Plan: Discharge Planning: Patient has a ride

## 2023-04-08 LAB — CBC
~~LOC~~ BKR MPV: 7.9 fL — ABNORMAL LOW (ref 7.0–11.0)
~~LOC~~ BKR PLATELET COUNT: 212 10*3/uL — ABNORMAL LOW (ref 150–400)
~~LOC~~ BKR RBC COUNT: 2.4 10*6/uL — ABNORMAL LOW (ref 4.00–5.00)
~~LOC~~ BKR RDW: 19 % — ABNORMAL HIGH (ref 11.0–15.0)
~~LOC~~ BKR WBC COUNT: 11 10*3/uL — ABNORMAL HIGH (ref 4.50–11.00)

## 2023-04-08 NOTE — Progress Notes
Med 1 Progress Note    Name:  Raven [name]   [id number]'[id number] Date:  04/08/2023  Admission Date: 04/02/2023  LOS: 6 days                     Assessment/Plan:    Principal Problem:    GI bleed    Raven [name] is a 77 y.o. 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     Interval Updates:  -Apixaban restarted yesterday  -Hemoglobin of 7.2 this morning, 7.1 on repeat     # Acute on chronic symptomatic anemia (MM, CKD) (s/p 2 unit pRBC)  # Melena  # Hematemesis (resolved)  # Chronic anticoagulation   # Hx diverticulosis and sigmoid colon mass resection 07/2022  - 2 episodes of hematemesis 12/6, 1 episode of melena 12/6 in ED  -transfused 2U PRBCs in ED for HGB of 6.9 (7.2 on 12/4)  -On Aranesp outpatient with H/O  - 12/2 EGD: 1.5 cm friable polyp in duodenum s/p clip x2, no bleeding seen  - 12/6 CT a/p: No acute inflammatory mass or bowel obstruction   -12/8 EGD with ulcer in duodenum and visible vessel, no active bleeding.  Epinephrine injection utilized at pace with thermal therapy and mechanical therapy of Endo Clip placed.  -Received 2 units pRBC 12/9  Plan:  -Apixaban restarted yesterday  -Hemoglobin of 7.2 this morning, 7.1 on repeat  -Transfuse if Hgb <7  -Pantoprazole 40 mg oral BID     # CAD s/p CABG and PCI  # HFrEF 35%, ICM on milrinone infusion, CRT-D  # Hx of Afib   # Carotid artery Disease with CEA 2016  -pt reports being on triple therapy (asa, plavix, eliquis) unsure why this is the case according to previous notes  - Discharged on Eliquis monotherapy however she thinks she had been taking plavix and aspirin as well post 12/4 discharge  Plan:  -apixaban  - Continue PTA amiodarone and beta-blocker and milrinone drip     # Leukocytosis   -WBC 15.4 on admit  -Hx of leukocytosis on previous admission     # Multiple Myeloma  -[facility]s [facility], on outpatient darzalex  -continue ppx acyclovir    # CKD4, stable  -Cr baseline variable. Around 2.3, Cr at discharge 12/4 was 2.61. eGFR ranges 18-25.  Plan  -Avoid nephrotoxic agents     # DM2,  - Hold PTA [name] and Levemir  -LDCF  -Accuchek ACHS    # Pancreatic Cyst  -CT A/P 12/6: Mild increase in size of indeterminate large lobulated cystic mass in the tail of pancreas. Endoscopic ultrasound recommended for further   evaluation.   Plan  -Planning for EUS in the next month for cystic mass in pancreas  -PT recs of home/prior situation    # Subcentimeter nodular opacities in lungs  -CT A/P 12/6: Tree-in-bud and multiple subcentimeter nodular opacities are   indeterminate.   Plan  -Follow-up CT chest in 6-8 weeks recommended for reevaluation.       FEN: Regular  Lines:  PIV  Urinary Catheter:  n/a  VTE: apixaban, SCDs    Code status:  Full Code  Disposition: Med1     Raven Polite, DO  Internal Medicine PGY-1  _______________________________________________________    Subjective  Patient feels well today and has no concerns at present. She is eager to go home because she feels well.    Medications  Scheduled Meds:acyclovir (ZOVIRAX) tablet 400 mg, 400 mg, Oral, BID  amiodarone (CORDARONE) tablet 200 mg, 200 mg, Oral, QDAY  apixaban (ELIQUIS) tablet 5 mg, 5 mg, Oral, BID  budesonide-formoterol HFA (SYMBICORT) 160-4.5 mcg/actuation inhalation 2 puff, 2 puff, Inhalation, BID  carvediloL (COREG) tablet 3.125 mg, 3.125 mg, Oral, BID w/meals  insulin aspart (U-100) (NOVOLOG FLEXPEN U-100 INSULIN) injection PEN 0-6 Units, 0-6 Units, Subcutaneous, ACHS (22)  pantoprazole DR (PROTONIX) tablet 40 mg, 40 mg, Oral, BID(11-21)  rosuvastatin (CRESTOR) tablet 10 mg, 10 mg, Oral, QDAY  sertraline (ZOLOFT) tablet 50 mg, 50 mg, Oral, QDAY    Continuous Infusions:   milrinone (PRIMACOR) 20 mg/D5W 100 mL infusion 0.3 mcg/kg/min (04/08/23 0629)     PRN and Respiratory Meds:acetaminophen Q4H PRN, albuterol sulfate Q4H PRN, dextrose 50% (D50) IV PRN, melatonin QHS PRN, ondansetron Q6H PRN **OR** ondansetron (ZOFRAN) IV Q6H PRN, polyethylene glycol 3350 QDAY PRN, sennosides-docusate sodium QDAY PRN    Objective:                        Vital Signs: Last Filed                 Vital Signs: 24 Hour Range   BP: 105/51 (12/12 0345)  Temp: 36.8 ?C (98.3 ?F) (12/12 0345)  Pulse: 85 (12/11 2325)  Respirations: 18 PER MINUTE (12/12 0345)  SpO2: 95 % (12/12 0345)  O2 Device: None (Room air) (12/12 0345) BP: (105-117)/(50-64)   Temp:  [36.4 ?C (97.5 ?F)-36.8 ?C (98.3 ?F)]   Pulse:  [85-96]   Respirations:  [18 PER MINUTE-19 PER MINUTE]   SpO2:  [95 %-100 %]   O2 Device: None (Room air)   Intensity Pain Scale (Self Report): 0 (04/07/23 1542) Vitals:    04/02/23 1024 04/02/23 1612 04/06/23 0200   Weight: 76.2 kg (167 lb 15.9 oz) 76.1 kg (167 lb 12.3 oz) 75.9 kg (167 lb 5.3 oz)       Intake/Output Summary:  (Last 24 hours)    Intake/Output Summary (Last 24 hours) at 04/08/2023 0803  Last data filed at 04/08/2023 0346  Gross per 24 hour   Intake 320 ml   Output 400 ml   Net -80 ml     Physical Exam  Constitutional:       Appearance: She is obese.   Eyes:      Extraocular Movements: Extraocular movements intact.   Cardiovascular:      Rate and Rhythm: Normal rate and regular rhythm.      Pulses: Normal pulses.   Pulmonary:      Effort: Pulmonary effort is normal.      Breath sounds: Normal breath sounds.   Abdominal:      General: Bowel sounds are normal.      Palpations: Abdomen is soft.   Skin:     General: Skin is warm and dry.   Neurological:      General: No focal deficit present.      Mental Status: She is alert and oriented to person, place, and time. Mental status is at baseline.   Psychiatric: Mood and Affect: Mood normal.        Lab Review  24-hour labs:  Results for orders placed or performed during the hospital encounter of 04/02/23 (from the past 24 hours)   HEMOGLOBIN & HEMATOCRIT    Collection Time: 04/07/23 12:20 PM   Result Value Ref Range    Hemoglobin 7.7 (L) 12.0 - 15.0 g/dL    Hematocrit 47.8 (L) 36.0 - 45.0 %   BASIC METABOLIC PANEL    Collection Time: 04/08/23  3:49 AM   Result Value Ref Range    Sodium 141 137 - 147 mmol/L    Potassium 3.9 3.5 - 5.1 mmol/L    Chloride 114 (H) 98 - 110 mmol/L    Glucose 195 (H) 70 - 100 mg/dL    Blood Urea Nitrogen 40 (H) 7 - 25 mg/dL    Creatinine 2.95 (H) 0.40 - 1.00 mg/dL    Calcium 8.1 (L) 8.5 - 10.6 mg/dL    CO2 19 (L) 21 - 30 mmol/L    Anion Gap 8 3 - 12    Glomerular Filtration Rate (GFR) 24 (L) >60 mL/min   CBC    Collection Time: 04/08/23  3:49 AM   Result Value Ref Range    White Blood Cells 12.00 (H) 4.50 - 11.00 10*3/uL    Red Blood Cells 2.35 (L) 4.00 - 5.00 10*6/uL    Hemoglobin 7.2 (L) 12.0 - 15.0 g/dL    Hematocrit 62.1 (L) 36.0 - 45.0 %    MCV 90.3 80.0 - 100.0 fL    MCH 30.5 26.0 - 34.0 pg    MCHC 33.8 32.0 - 36.0 g/dL    RDW 30.8 (H) 65.7 - 15.0 %    Platelet Count 212 150 - 400 10*3/uL    MPV 7.9 7.0 - 11.0 fL

## 2023-04-08 NOTE — Progress Notes
PHYSICAL THERAPY  MOBILITY NOTE      Name: Raven [name]   MRN: [id number]     DOB: [birth year]      Age: 77 y.o.  Admission Date: 04/02/2023     LOS: 6 days     Date of Service: 04/08/2023    As part of the ongoing rehabilitation plan of care, patient was mobilized and/or completed ADLs with assistance from the rehabilitation technician .    Mobility  Patient Turn/Position: Supine  Progressive Mobility Level: Walk in hallway  Distance Walked (feet): 100 ft  Level of Assistance: Stand by assistance  Assistive Device: Walker  Activity Limited By: Fatigue    Rehab Technician: Merla Riches  Date: 04/08/2023

## 2023-04-08 NOTE — Progress Notes
Night 1900-0700    Pain: Pain is controlled without any medications    Nutrition: Diet- Diet: Regular    Activity: Ambulation Episodes: 1 walk during shift    Incentive Spirometer this shift: No  Max Volume: n/a    Acute Events, Nursing Intervention or Provider Communication: n/    Patient Interventions and Education  Fall Risk/JHFRAT  Fall Risk Score: Total Fall Risk Score: 5.    The following interventions were implemented: Remembering to call for assistance with ambulation, Assistance managing patient care equipment, Bed/chair alarm, and Fall identifiers in place, patient Verbalizes Understanding of fall risk factors and interventions.    Restraints: No  Restraints Goal: choice: N/A  See Docflowsheet for restraint documentation, interventions, education, etc.    Intake and Output:      Date 04/07/23 0701 - 04/08/23 0700 04/08/23 0701 - 04/09/23 0700   Shift 0701-1900 1901-0700 24 Hour Total 0701-1900 1901-0700 24 Hour Total   INTAKE   P.O. 120 200 320      Shift Total(mL/kg) 120(1.6) 200(2.6) 320(4.2)      OUTPUT   Urine(mL/kg/hr)  400(0.4) 400(0.2)        Urine  400 400      Other           Stool Occurrence 1 x  1 x        Urine Occurrence  1 x  1 x      Shift Total(mL/kg)  400(5.3) 400(5.3)      NET 120 -200 -80      Weight (kg) 75.9 75.9 75.9 75.9 75.9 75.9         This RN provided education to patient. The following education topics were reviewed with patient: Importance of Ambulation Patient Verbalizes Understanding     Discharge Plan: Discharge Planning: Patient has a ride

## 2023-04-09 ENCOUNTER — Encounter: Admit: 2023-04-09 | Discharge: 2023-04-09 | Payer: MEDICARE

## 2023-04-09 DIAGNOSIS — Z91148 Patient's other noncompliance with medication regimen for other reason: Secondary | ICD-10-CM

## 2023-04-09 DIAGNOSIS — I255 Ischemic cardiomyopathy: Secondary | ICD-10-CM

## 2023-04-09 DIAGNOSIS — Z823 Family history of stroke: Secondary | ICD-10-CM

## 2023-04-09 DIAGNOSIS — Z833 Family history of diabetes mellitus: Secondary | ICD-10-CM

## 2023-04-09 DIAGNOSIS — Z7952 Long term (current) use of systemic steroids: Secondary | ICD-10-CM

## 2023-04-09 DIAGNOSIS — T45521A Poisoning by antithrombotic drugs, accidental (unintentional), initial encounter: Secondary | ICD-10-CM

## 2023-04-09 DIAGNOSIS — D62 Acute posthemorrhagic anemia: Secondary | ICD-10-CM

## 2023-04-09 DIAGNOSIS — N184 Chronic kidney disease, stage 4 (severe): Secondary | ICD-10-CM

## 2023-04-09 DIAGNOSIS — Z9049 Acquired absence of other specified parts of digestive tract: Secondary | ICD-10-CM

## 2023-04-09 DIAGNOSIS — I130 Hypertensive heart and chronic kidney disease with heart failure and stage 1 through stage 4 chronic kidney disease, or unspecified chronic kidney disease: Secondary | ICD-10-CM

## 2023-04-09 DIAGNOSIS — Z882 Allergy status to sulfonamides status: Secondary | ICD-10-CM

## 2023-04-09 DIAGNOSIS — Z9889 Other specified postprocedural states: Secondary | ICD-10-CM

## 2023-04-09 DIAGNOSIS — Z79899 Other long term (current) drug therapy: Secondary | ICD-10-CM

## 2023-04-09 DIAGNOSIS — R5383 Other fatigue: Secondary | ICD-10-CM

## 2023-04-09 DIAGNOSIS — I5022 Chronic systolic (congestive) heart failure: Secondary | ICD-10-CM

## 2023-04-09 DIAGNOSIS — Z8542 Personal history of malignant neoplasm of other parts of uterus: Secondary | ICD-10-CM

## 2023-04-09 DIAGNOSIS — Z7984 Long term (current) use of oral hypoglycemic drugs: Secondary | ICD-10-CM

## 2023-04-09 DIAGNOSIS — Z1152 Encounter for screening for COVID-19: Secondary | ICD-10-CM

## 2023-04-09 DIAGNOSIS — J4489 Other specified chronic obstructive pulmonary disease (HCC): Secondary | ICD-10-CM

## 2023-04-09 DIAGNOSIS — I340 Nonrheumatic mitral (valve) insufficiency: Secondary | ICD-10-CM

## 2023-04-09 DIAGNOSIS — Z809 Family history of malignant neoplasm, unspecified: Secondary | ICD-10-CM

## 2023-04-09 DIAGNOSIS — Z888 Allergy status to other drugs, medicaments and biological substances status: Secondary | ICD-10-CM

## 2023-04-09 DIAGNOSIS — C9000 Multiple myeloma not having achieved remission: Secondary | ICD-10-CM

## 2023-04-09 DIAGNOSIS — D631 Anemia in chronic kidney disease: Secondary | ICD-10-CM

## 2023-04-09 DIAGNOSIS — Z955 Presence of coronary angioplasty implant and graft: Secondary | ICD-10-CM

## 2023-04-09 DIAGNOSIS — E1122 Type 2 diabetes mellitus with diabetic chronic kidney disease: Secondary | ICD-10-CM

## 2023-04-09 DIAGNOSIS — Z803 Family history of malignant neoplasm of breast: Secondary | ICD-10-CM

## 2023-04-09 DIAGNOSIS — K91840 Postprocedural hemorrhage and hematoma of a digestive system organ or structure following a digestive system procedure: Secondary | ICD-10-CM

## 2023-04-09 DIAGNOSIS — Z8041 Family history of malignant neoplasm of ovary: Secondary | ICD-10-CM

## 2023-04-09 DIAGNOSIS — Z90710 Acquired absence of both cervix and uterus: Secondary | ICD-10-CM

## 2023-04-09 DIAGNOSIS — I4891 Unspecified atrial fibrillation: Secondary | ICD-10-CM

## 2023-04-09 DIAGNOSIS — K862 Cyst of pancreas: Secondary | ICD-10-CM

## 2023-04-09 DIAGNOSIS — T39011A Poisoning by aspirin, accidental (unintentional), initial encounter: Secondary | ICD-10-CM

## 2023-04-09 DIAGNOSIS — Z8249 Family history of ischemic heart disease and other diseases of the circulatory system: Secondary | ICD-10-CM

## 2023-04-09 DIAGNOSIS — R5381 Other malaise: Secondary | ICD-10-CM

## 2023-04-09 DIAGNOSIS — I2510 Atherosclerotic heart disease of native coronary artery without angina pectoris: Secondary | ICD-10-CM

## 2023-04-09 DIAGNOSIS — Z860100 Personal history of colon polyps, unspecified: Secondary | ICD-10-CM

## 2023-04-09 DIAGNOSIS — Z794 Long term (current) use of insulin: Secondary | ICD-10-CM

## 2023-04-09 DIAGNOSIS — Z951 Presence of aortocoronary bypass graft: Secondary | ICD-10-CM

## 2023-04-09 DIAGNOSIS — Z7901 Long term (current) use of anticoagulants: Secondary | ICD-10-CM

## 2023-04-09 DIAGNOSIS — D638 Anemia in other chronic diseases classified elsewhere: Secondary | ICD-10-CM

## 2023-04-09 DIAGNOSIS — D72829 Elevated white blood cell count, unspecified: Secondary | ICD-10-CM

## 2023-04-09 DIAGNOSIS — Z87891 Personal history of nicotine dependence: Secondary | ICD-10-CM

## 2023-04-09 DIAGNOSIS — Z95810 Presence of automatic (implantable) cardiac defibrillator: Secondary | ICD-10-CM

## 2023-04-09 DIAGNOSIS — Z7951 Long term (current) use of inhaled steroids: Secondary | ICD-10-CM

## 2023-04-09 MED ORDER — PANTOPRAZOLE 40 MG PO TBEC
40 mg | ORAL_TABLET | Freq: Two times a day (BID) | ORAL | 0 refills | 90.00000 days | Status: AC
Start: 2023-04-09 — End: ?
  Filled 2023-04-09: qty 112, 56d supply, fill #1

## 2023-04-09 MED ORDER — ROSUVASTATIN 20 MG PO TAB
10 mg | ORAL_TABLET | Freq: Every day | ORAL | 0 refills | 90.00000 days | Status: AC
Start: 2023-04-09 — End: ?
  Filled 2023-04-09: qty 45, 90d supply, fill #1

## 2023-04-09 MED FILL — CARVEDILOL 3.125 MG PO TAB: 3.125 mg | ORAL | 90 days supply | Qty: 180 | Fill #1 | Status: CP

## 2023-04-09 NOTE — Telephone Encounter
Attempted to contact Katelyn with [facility]- LVM x 1 with call back number.

## 2023-04-09 NOTE — Case Management (ED)
Case Management Progress Note    NAME:Raven [name]                          MRN: [id number]              DOB:Aug 20, 1945          AGE: 77 y.o.  ADMISSION DATE: 04/02/2023             DAYS ADMITTED: LOS: 7 days      Today's Date: 04/09/2023    PLAN: To discharge home when medically stable.     Expected Discharge Date: 04/09/2023 2:00 PM  Is Patient Medically Stable: Yes   Are there Barriers to Discharge? no    INTERVENTION/DISPOSITION:  Discharge Planning              Discharge Planning: Home Health, Home Infusion-Enteral-TP[facility]  NCM faxed ROC orders to [facility] and notified of discharge today. Patient has milrinone at bedside and home for discharge.     Patient's son expressed concern over correctly administering medications for his mother.  NCM spoke with HH intake and added med mgmt to [facility] orders. Pharmacy went over medications, and HH to setup weekly pill box. NCM faxed ROC orders and will fax AVS.     Transportation              Does the Patient Need Case Management to Arrange Discharge Transport? (ex: facility, ambulance, wheelchair/stretcher, Medicaid, cab, other): No  Will the Patient Use Family Transport?: Yes  Transportation Name, Phone and Availability #1: [name] (son) [phone]  Support               Reviewed EMR and attended huddle.     Info or Referral                 Positive SDOH Domains and Potential Barriers                   Medication Needs                                Financial                 Legal                 Other                 Discharge Disposition                 Selected Continued Care - Admitted Since 04/02/2023       [facility]       Service Provider Services Address Phone Fax Patient Preferred    [facility] [address] [place] [address] [phone] --              [facility]       Service Provider Services Address Phone Fax Patient Preferred    [facility] [address], [place] [address] [phone] --                Alene Mires, RN, BSN Nurse Case Manager  Available on [employer]

## 2023-04-09 NOTE — Telephone Encounter
Received call from [facility] [phone] opt 3 reporting pts local lab is closing and it may be a few weeks before she is able to have these done.

## 2023-04-09 NOTE — Progress Notes
Med 1 Progress Note    Name:  Raven [name]   GNFAO'Z Date:  04/09/2023  Admission Date: 04/02/2023  LOS: 7 days                     Assessment/Plan:    Principal Problem:    GI bleed    Kella Gloeckner is a 77 y.o. 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     Interval Updates:  -Med rec completed with pharmacy prior to patient's DC today  -Will need EUS in 1 month, CT chest in 6-8 weeks  -Holding torsemide until cardiology follow up outpatient     # Acute on chronic symptomatic anemia (MM, CKD) (s/p 2 unit pRBC)  # Melena  # Hematemesis (resolved)  # Chronic anticoagulation   # Hx diverticulosis and sigmoid colon mass resection 07/2022  - 2 episodes of hematemesis 12/6, 1 episode of melena 12/6 in ED  -transfused 2U PRBCs in ED for HGB of 6.9 (7.2 on 12/4)  -On Aranesp outpatient with H/O  - 12/2 EGD: 1.5 cm friable polyp in duodenum s/p clip x2, no bleeding seen  - 12/6 CT a/p: No acute inflammatory mass or bowel obstruction   -12/8 EGD with ulcer in duodenum and visible vessel, no active bleeding.  Epinephrine injection utilized at pace with thermal therapy and mechanical therapy of Endo Clip placed.  -Received 2 units pRBC 12/9  Plan:  -Apixaban   -Pantoprazole 40 mg oral BID     # CAD s/p CABG and PCI  # HFrEF 35%, ICM on milrinone infusion, CRT-D  # Hx of Afib   # Carotid artery Disease with CEA 2016  -pt reports being on triple therapy (asa, plavix, eliquis) unsure why this is the case according to previous notes  - Discharged on Eliquis monotherapy however she thinks she had been taking plavix and aspirin as well post 12/4 discharge  Plan:  -apixaban  -amiodarone and beta-blocker and milrinone drip     # Leukocytosis   -WBC 15.4 on admit  -Hx of leukocytosis on previous admission     # Multiple Myeloma  -[facility]s [facility], on outpatient darzalex  -continue ppx acyclovir    # CKD4, stable  -Cr baseline variable. Around 2.3, Cr at discharge 12/4 was 2.61. eGFR ranges 18-25.  Plan  -Avoid nephrotoxic agents     # DM2,  - Hold PTA [name] and Levemir  -LDCF  -Accuchek ACHS    # Pancreatic Cyst  -CT A/P 12/6: Mild increase in size of indeterminate large lobulated cystic mass in the tail of pancreas. Endoscopic ultrasound recommended for further   evaluation.   Plan  -Planning for EUS in the next month for cystic mass in pancreas    # Subcentimeter nodular opacities in lungs  -CT A/P 12/6: Tree-in-bud and multiple subcentimeter nodular opacities are   indeterminate.   Plan  -Follow-up CT chest in 6-8 weeks recommended for reevaluation.       FEN: Regular  Lines:  PIV  Urinary Catheter:  n/a  VTE: apixaban, SCDs    Code status: Full Code  Disposition: Med1  Kathee Polite, DO  Internal Medicine PGY-1  _______________________________________________________    Subjective  Patient feels well today and is excited to go home. She does not feel fatigued and has no other concerns. Finished med red with pharmacy.    Medications  Scheduled Meds:acyclovir (ZOVIRAX) tablet 400 mg, 400 mg, Oral, BID  amiodarone (CORDARONE) tablet 200 mg, 200 mg, Oral, QDAY  apixaban (ELIQUIS) tablet 5 mg, 5 mg, Oral, BID  budesonide-formoterol HFA (SYMBICORT) 160-4.5 mcg/actuation inhalation 2 puff, 2 puff, Inhalation, BID  carvediloL (COREG) tablet 3.125 mg, 3.125 mg, Oral, BID w/meals  insulin aspart (U-100) (NOVOLOG FLEXPEN U-100 INSULIN) injection PEN 0-6 Units, 0-6 Units, Subcutaneous, ACHS (22)  pantoprazole DR (PROTONIX) tablet 40 mg, 40 mg, Oral, BID(11-21)  rosuvastatin (CRESTOR) tablet 10 mg, 10 mg, Oral, QDAY  sertraline (ZOLOFT) tablet 50 mg, 50 mg, Oral, QDAY    Continuous Infusions:   milrinone (PRIMACOR) 20 mg/D5W 100 mL infusion 0.3 mcg/kg/min (04/09/23 0127)     PRN and Respiratory Meds:acetaminophen Q4H PRN, albuterol sulfate Q4H PRN, dextrose 50% (D50) IV PRN, melatonin QHS PRN, ondansetron Q6H PRN **OR** ondansetron (ZOFRAN) IV Q6H PRN, polyethylene glycol 3350 QDAY PRN, sennosides-docusate sodium QDAY PRN    Objective:                        Vital Signs: Last Filed                 Vital Signs: 24 Hour Range   BP: 98/57 (12/13 0524)  Temp: 36.8 ?C (98.2 ?F) (12/13 0524)  Pulse: 88 (12/13 0524)  Respirations: 18 PER MINUTE (12/13 0524)  SpO2: 97 % (12/13 0524)  O2 Device: None (Room air) (12/13 0524) BP: (98-148)/(54-75)   Temp:  [36.3 ?C (97.3 ?F)-36.9 ?C (98.4 ?F)]   Pulse:  [88-94]   Respirations:  [16 PER MINUTE-18 PER MINUTE]   SpO2:  [96 %-100 %]   O2 Device: None (Room air)     Vitals:    04/02/23 1024 04/02/23 1612 04/06/23 0200   Weight: 76.2 kg (167 lb 15.9 oz) 76.1 kg (167 lb 12.3 oz) 75.9 kg (167 lb 5.3 oz)       Intake/Output Summary:  (Last 24 hours)    Intake/Output Summary (Last 24 hours) at 04/09/2023 0815  Last data filed at 04/09/2023 0400  Gross per 24 hour   Intake 1020 ml   Output 520 ml   Net 500 ml     Physical Exam  Constitutional:       Appearance: She is obese.   Eyes:      Extraocular Movements: Extraocular movements intact.   Cardiovascular:      Rate and Rhythm: Normal rate and regular rhythm.      Pulses: Normal pulses.   Pulmonary:      Effort: Pulmonary effort is normal.      Breath sounds: Normal breath sounds.   Abdominal:      General: Bowel sounds are normal.      Palpations: Abdomen is soft.   Skin:     General: Skin is warm and dry.   Neurological:      General: No focal deficit present.      Mental Status: She is alert and oriented to person, place, and time. Mental status is at baseline.   Psychiatric:         Mood and Affect: Mood normal.        Lab Review  24-hour labs:  Results for orders placed or performed during the hospital encounter of 04/02/23 (from the past 24 hours)   CBC    Collection Time: 04/08/23 10:35 AM   Result Value Ref Range    White Blood Cells 11.50 (H) 4.50 - 11.00 10*3/uL    Red Blood Cells 2.47 (L) 4.00 - 5.00 10*6/uL    Hemoglobin 7.1 (L) 12.0 - 15.0 g/dL    Hematocrit 29.5 (L) 36.0 - 45.0 %    MCV 90.4 80.0 - 100.0 fL    MCH 28.9 26.0 - 34.0 pg    MCHC 31.9 (L) 32.0 - 36.0 g/dL    RDW 28.4 (H) 13.2 - 15.0 %    Platelet Count 225 150 - 400 10*3/uL    MPV 8.5 7.0 - 11.0 fL   CBC    Collection Time: 04/09/23  2:10 AM   Result Value Ref Range    White Blood Cells 13.40 (H) 4.50 - 11.00 10*3/uL    Red Blood Cells 2.48 (L) 4.00 - 5.00 10*6/uL    Hemoglobin 7.2 (L) 12.0 - 15.0 g/dL    Hematocrit 44.0 (L) 36.0 - 45.0 %    MCV 90.1 80.0 - 100.0 fL    MCH 28.9 26.0 - 34.0 pg    MCHC 32.1 32.0 - 36.0 g/dL    RDW 10.2 (H) 72.5 - 15.0 %    Platelet Count 246 150 - 400 10*3/uL    MPV 8.4 7.0 - 11.0 fL   BASIC METABOLIC PANEL    Collection Time: 04/09/23  2:10 AM   Result Value Ref Range    Sodium 140 137 - 147 mmol/L    Potassium 4.0 3.5 - 5.1 mmol/L    Chloride 112 (H) 98 - 110 mmol/L    Glucose 192 (H) 70 - 100 mg/dL    Blood Urea Nitrogen 37 (H) 7 - 25 mg/dL    Creatinine 3.66 (H) 0.40 - 1.00 mg/dL    Calcium 8.1 (L) 8.5 - 10.6 mg/dL    CO2 18 (L) 21 - 30 mmol/L    Anion Gap 10 3 - 12    Glomerular Filtration Rate (GFR) 25 (L) >60 mL/min

## 2023-04-09 NOTE — Progress Notes
Mardene Celeste discharged on 04/09/2023.   Marland Kitchen  Discharge instructions reviewed with patient and family.  Valuables returned: All valuables at bedside, returned to patient.  ADL Belongings at Bedside: None.  Home medications: [facility]    .  Functional assessment at discharge complete: Yes .    PIVs removed at this time. Patient left unit via Wheelchair, no complaints.

## 2023-04-09 NOTE — Progress Notes
Day 0700-1900    Pain: The patient is not having any pain    Nutrition: Diet- Diet: Regular    Activity: Ambulation Episodes: 3 walks during shift    Incentive Spirometer this shift: No  Max Volume: N/A    Acute Events, Nursing Intervention or Provider Communication: No acute provider communication or events during this shift.     Patient Interventions and Education  Fall Risk/JHFRAT  Fall Risk Score: Total Fall Risk Score: 5.    The following interventions were implemented: Remembering to call for assistance with ambulation, Assistance managing patient care equipment, Bed/chair alarm, 1 person assist, Fall identifiers in place, and Standard mobility safety, patient Verbalizes Understanding and Demonstrated Understanding of fall risk factors and interventions. Patient compliance: Patient compliant with interventions.    Restraints: No  Restraints Goal: choice: N/A  See Docflowsheet for restraint documentation, interventions, education, etc.    Intake and Output:      Date 04/07/23 0701 - 04/08/23 0700 04/08/23 0701 - 04/09/23 0700   Shift 0701-1900 1901-0700 24 Hour Total 0701-1900 1901-0700 24 Hour Total   INTAKE   P.O. 120 200 320 720  720   Shift Total(mL/kg) 120(1.6) 200(2.6) 320(4.2) 720(9.5)  720(9.5)   OUTPUT   Urine(mL/kg/hr)  400(0.4) 400(0.2) 100  100     Urine  400 400 100  100   Other           Stool Occurrence 1 x  1 x 0 x  0 x     Urine Occurrence  1 x  1 x 1 x  1 x   Shift Total(mL/kg)  400(5.3) 400(5.3) 100(1.3)  100(1.3)   NET 120 -200 -80 620  620   Weight (kg) 75.9 75.9 75.9 75.9 75.9 75.9         This RN provided education to patient. The following education topics were reviewed with patient: Medication carvedilol   Patient Demonstrated Understanding     Discharge Plan: Discharge Planning: Patient has a ride

## 2023-04-09 NOTE — Progress Notes
PHYSICAL THERAPY  MOBILITY NOTE      Name: Raven [name]   MRN: [id number]     DOB: Feb 01, 1946      Age: 77 y.o.  Admission Date: 04/02/2023     LOS: 7 days     Date of Service: 04/09/2023    As part of the ongoing rehabilitation plan of care, patient was mobilized and/or completed ADLs with assistance from the rehabilitation technician .    Mobility  Patient Turn/Position: Supine  Progressive Mobility Level: Walk in hallway  Distance Walked (feet): 150 ft  Level of Assistance: Assist X1  Assistive Device: Walker  Activity Limited By: Fatigue    Rehab Technician: Carilyn Goodpasture  Date: 04/09/2023

## 2023-04-11 ENCOUNTER — Emergency Department
Admit: 2023-04-11 | Discharge: 2023-04-11 | Payer: MEDICARE | Attending: Student in an Organized Health Care Education/Training Program

## 2023-04-11 DIAGNOSIS — R0600 Dyspnea, unspecified: Secondary | ICD-10-CM

## 2023-04-11 DIAGNOSIS — I509 Heart failure, unspecified: Secondary | ICD-10-CM

## 2023-04-11 DIAGNOSIS — I5023 Acute on chronic systolic (congestive) heart failure: Secondary | ICD-10-CM

## 2023-04-11 MED ORDER — ONDANSETRON HCL (PF) 4 MG/2 ML IJ SOLN
4 mg | Freq: Once | INTRAVENOUS | 0 refills | Status: CP
Start: 2023-04-11 — End: ?
  Administered 2023-04-12: 06:00:00 4 mg via INTRAVENOUS

## 2023-04-11 NOTE — Discharge Instructions - Pharmacy
Discharge Summary      Name: Raven [name]  Medical Record Number: [id number]        Account Number:  [id number]  Date Of Birth:  March 08, 1946                         Age:  77 y.o.  Admit date:  04/02/2023                     Discharge date: 04/09/2023      Discharge Attending:  Dr. Chinita Greenland  Discharge Summary Completed By: Kathee Polite, DO    Service: Med 1- 2042    Reason for hospitalization:  GI bleed [K92.2]    Primary Discharge Diagnosis:   GI bleed    Hospital Diagnoses:  Hospital Problems        Active Problems    * (Principal) GI bleed     Present on Admission:   GI bleed    Significant Past Medical History        Anxiety  Arthritis  Asthma  Asymptomatic stenosis of right carotid artery  Back pain  Cancer of uterus (HCC)  COPD (chronic obstructive pulmonary disease) (HCC)  Coronary artery disease  Diabetes mellitus (HCC)  Dyslipidemia  Flank mass  Fluid retention  Heart disease  Hypertension  Other malignant neoplasm without specification of site  Sleep apnea      Comment:  Compliant w CPAP  Vision decreased    Allergies   Bortezomib, Allopurinol, and Sulfa (sulfonamide antibiotics)    Brief Hospital Course   The patient was admitted and the following issues were addressed during this hospitalization: (with pertinent details including admission exam/imaging/labs).      Raven [name] is a 77 y.o. 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 Patient was recently discharged on eliquis monotherapy, patient thinks she had been taking aspirin and plavix concurrently. Required 4 units pRBC total for low Hgb, 2 units on 12/6 and 2 units on 12/9. CT Abd/Pelv with no acute inflammatory process or bowel obstruction. EGD 12/8 showed an ulcer in the duodenum and visible vessel, no active bleeding. Epinephrine injection utilized with thermal therapy with endcoclip placed. Patient experienced symptomatic relief post transfusions and Hgb stable for discharge. Imaging also demonstrated a mild increase in large lobulated cystic pancreatic mass, with recommendations of EUS in the next month to further evaluate. Tree-in-bud opacities found in lungs, follow up CT chest recommended in 6-8 weeks.    Items Needing Follow Up   Pending items or areas that need to be addressed at follow up:     -Holding torsemide until cardiology follow up outpatient  -EUS needed in 1 month to evaluate pancreatic cystic mass  -CT chest needed in 6-8 weeks for nodular opacities    Pending Labs and Follow Up Radiology    none    Medications      Medication List      CHANGE how you take these medications     pantoprazole DR 40 mg tablet; Commonly known as: PROTONIX; Dose: 40 mg;   Take one tablet by mouth twice daily. Indications: gastritis; For:   gastritis; Quantity:  112 tablet; Refills: 0; What changed: when to take   this   rosuvastatin 20 mg tablet; Commonly known as: CRESTOR; Dose: 10 mg; Take   one-half tablet by mouth daily. Indications: excessive fat in the blood;   For: excessive fat in the blood; Quantity: 90 tablet; Refills: 0; What   changed: how much to take     CONTINUE taking these medications     acetaminophen 325 mg tablet; Commonly known as: TYLENOL; Dose: 975 mg;   Take three tablets by mouth every 8 hours as needed for Pain. Indications:   pain; For: pain; Quantity: 40 tablet; Refills: 0   acyclovir 400 mg tablet; Commonly known as: ZOVIRAX; Dose: 400 mg;   Refills: 0   albuterol sulfate 90 mcg/actuation HFA aerosol inhaler; Commonly known   as: PROAIR HFA; Dose: 2 puff; Refills: 0   amiodarone 200 mg tablet; Commonly known as: CORDARONE; Dose: 200 mg;   Take one tablet by mouth daily. Indications: prevention of recurrent   atrial fibrillation; For: prevention of recurrent atrial fibrillation;   Quantity: 90 tablet; Refills: 1   apixaban 5 mg tablet; Commonly known as: ELIQUIS; Dose: 5 mg; Take one   tablet by mouth twice daily. Indications: afib; For: afib; Quantity: 60   tablet; Refills: 0   budesonide-formoterol HFA 160-4.5 mcg/actuation aerosol inhaler;   Commonly known as: SYMBICORT; Dose: 2 puff; Inhale two puffs by mouth into   the lungs twice daily. Indications: bronchospasm prevention with COPD;   For: bronchospasm prevention with COPD; Quantity: 30.6 g; Refills: 0   carvediloL 3.125 mg tablet; Commonly known as: COREG; Dose: 3.125 mg;   Take one tablet by mouth twice daily with meals. Take with food.    Indications: heart failute with rEF; For: heart failute with rEF;   Quantity: 180 tablet; Refills: 0   dexAMETHasone 4 mg tablet; Commonly known as: DECADRON; Dose: 8 mg;   Refills: 0   empagliflozin 25 mg tablet; Commonly known as: [name]; Dose: 25 mg;   Refills: 0   EMU OIL (BULK) MISC; Refills: 0   febuxostat 40 mg tablet; Commonly known as: ULORIC; Dose: 20 mg; Take   one-half tablet by mouth daily. Indications: taken on chemo days; For:   taken on chemo days; Quantity: 45 tablet; Refills: 0   ferrous sulfate 325 mg (65 mg iron) tablet; Commonly known as: FEOSOL;   Dose: 325 mg; Refills: 0   insulin detemir U-100 100 unit/mL (3 mL) injection pen; Commonly known   as: LEVEMIR FLEXTOUCH; Resume after speaking with your PCP or   endocrinology  Indications: type 2 diabetes mellitus; For: type 2 diabetes   mellitus; Refills: 0   MILRINONE IN D5W IV; Refills: 0   nitroglycerin 0.4 mg tablet; Commonly known as: NITROSTAT; Dose: 0.4 mg;   Refills: 0   potassium chloride SR 20 mEq tablet; Commonly known as: K-DUR; Resume   after cardiology follow up  Indications: prevention of low potassium in   the blood; For: prevention of low potassium in the blood; Refills: 0   sertraline 50 mg tablet; Commonly known as: ZOLOFT; Dose: 1 tablet;   Refills: 0   torsemide 20 mg tablet; Commonly known as: DEMADEX; Resume after   cardiology follow up  Indications: accumulation of fluid resulting from   chronic heart failure; For: accumulation of fluid resulting from chronic   heart failure; Refills: 0   VITAMIN B-12 1,000 mcg tablet; Generic drug: cyanocobalamin (vitamin   B-12); Dose: 5,000 Units; Refills:  0   VITAMIN D3 1,000 units tablet; Generic drug: CHOLEcalciferoL (vitamin   D3); Dose: 1,000 Units; Refills: 0     STOP taking these medications     dicyclomine 10 mg capsule; Commonly known as: BENTYL       Return Appointments and Scheduled Appointments     Scheduled appointments:      Apr 13, 2023 11:00 AM  Follow-up visit with [name], APRN-NP  Cardiovascular Medicine: [facility] (CVM Exam) [address]  [place] [address]  [phone]     May 03, 2023 9:00 AM  Office visit with Fredricka Bonine, APRN-NP  Cardiovascular Medicine: [facility] (CVM Exam) [address]  [address]  [place] [address]  [phone]     Sep 16, 2023 1:30 PM  Office visit with Arneta Cliche, MD  Cardiovascular Medicine: [facility] (CVM Exam) [address]  [address]  [place] [address]  [phone]     Sep 17, 2023 2:30 PM  ICD Check with CMPB3 PACEMAKER  Cardiovascular Medicine: [facility], [address] (CVM Procedural) [phone] [address]  [address]  [place] [address]  [phone]     Sep 17, 2023 3:00 PM  Office visit with Smith Robert, MD  Cardiovascular Medicine: [facility], [address] (CVM Exam) [phone] [address]  [address]  [place] [address]  [phone]          Things you need to do       Follow up with Barbaraann Rondo, MD    Phone: [phone]    Where: [address], [place] [address]          Contact information for after-discharge care [facility]       [facility]    Phone: [phone]    Fax: [phone]    Where: [address], [place] [phone]    Service: Home Health Services              [facility]       *[facility]    Phone: [phone]    Fax: [phone]    Where: [address], [place]  [phone]    Service: Home Infusion and Injection                  Consults, Procedures, Diagnostics, Micro, Pathology   Consults: GI  Surgical Procedures & Dates: None  Significant Diagnostic Studies, Micro and Procedures: noted in brief hospital course  Significant Pathology: noted in brief hospital course    Discharge Disposition, Condition   Patient Disposition: Home or Self Care [01]  Condition at Discharge: Stable    Code Status   Prior    Patient Instructions     Activity       Activity as Tolerated   As directed      It is important to keep increasing your activity level after you leave the hospital.  Moving around can help prevent blood clots, lung infection (pneumonia) and other problems.  Gradually increasing the number of times you are up moving around will help you return to your normal activity level more quickly.  Continue to increase the number of times you are up to the chair and walking daily to return to your normal activity level. Begin to work toward  your normal activity level at discharge          Diet       Regular Diet   As directed      You have no dietary restriction. Please continue with a healthy balanced diet.           Report These Signs and Symptoms   As directed      Please contact your doctor if you have any of the following symptoms: uncontrolled pain, difficulty breathing, chest pain, severe abdominal pain, or unable to have bowel movement      Questions About Your Stay   Complete by: As directed      Discharging attending physician: Mammie Russian    Order comments: For questions or concerns regarding your hospital stay, call [phone].       Additional Orders: Case Management, Supplies, Home Health     Home Health/DME                HOME HEALTH/DME  ONCE        Comments: Home Health/Durable Medical Equipment Order Details    Patient Name:  Raven [name]                   Medical Record Number:   [id number]    Patient Diagnosis & ICD 10 Codes: Asymptomatic stenosis of right carotid artery [I65.21]    Coronary artery disease [I25.10]    Patient Height: 157.5 cm (5' 2)   Patient Weight: 75.9 kg (167 lb 5.3 oz)    Provider Information:  MD Name: Mammie Russian  MD Phone Number: [phone]  MD Fax Number: [phone]  MD NPI: [id number]    Agency Instructions:     Patient now stable for discharge. Please resume the following disciplines: SN, PT, OT  RN to complete general assessment, including vitals with temperature, monitor and teach patient and/or caregiver medication management and compliance, monitor and teach pain management and pain medication compliance when necessary. Monitor and teach signs and symptoms of infection, monitor and teach disease management, including signs and symptoms of diet, who and when to call, hospital readmission avoidance. Please see attached AVS for additional discharge instruction.    Home Health to setup weekly pill box for patient.     KUHI to resume IV medication:   Drug: milrinone 0.46mcg/kg/min at 78.3kg IV via continuous infusion                     Administration method:  CADD [employer] ambulatory pump      Clinical findings to support homebound status: Poor tolerance for activity  Clinical findings to support home care services: Requires instructions/administration of injections/IV therapy  I certify that this patient is under my care and that I, or a nurse practitioner or physician's assistant working with me, had a face-to-face encounter that meets the physician's face-to-face encounter requirements with this patient on 12/12.    This patient is under my care, and I have initiated the establishment of the plan of care.  This patient will be followed by a physician after discharge, who will periodically review the plan of care.   Question Answer Comment   Attending Name/Contact Mammie Russian    PCP Name/Contact Barbaraann Rondo Phone: [phone] Fax: [phone]                             Signed:  Kathee Polite, DO  04/11/2023  cc:  Primary Care Physician:  Barbaraann Rondo   Verified    Referring physicians:  No ref. provider found   Additional provider(s):        Did we miss something? If additional records are needed, please fax a request on office letterhead to [phone]. Please include the patient's name, date of birth, fax number and type of information needed. Additional request can be made by email at [email or web address]. For general questions of information about electronic records sharing, call [phone].

## 2023-04-12 ENCOUNTER — Encounter: Admit: 2023-04-12 | Discharge: 2023-04-12 | Payer: MEDICARE

## 2023-04-12 ENCOUNTER — Inpatient Hospital Stay: Admit: 2023-04-12 | Discharge: 2023-04-12 | Payer: MEDICARE

## 2023-04-12 ENCOUNTER — Inpatient Hospital Stay
Admit: 2023-04-12 | Discharge: 2023-04-15 | Disposition: A | Discharge status: Home Health | Payer: MEDICARE | Attending: Student in an Organized Health Care Education/Training Program

## 2023-04-12 DIAGNOSIS — R0602 Shortness of breath: Secondary | ICD-10-CM

## 2023-04-12 LAB — 2D + DOPPLER ECHO
AV PEAK VELOCITY: 1.2 m/s
BSA: 1.8 m2
DOP CALC LVOT DIAMETER: 1.9 cm — ABNORMAL LOW (ref 21–30)
DOP CALC LVOT PEAK VEL VTI: 12 cm (ref 3–12)
DOP CALC MV VTI: 31 cm
E WAVE DECELARTION TIME: 172 ms
E/A RATIO: 1.9
EJECTION FRACTION: 24 %
FRACTIONAL SHORTENING: 14 % (ref 28–44)
INTERVENTRICULAR SEPTUM: 0.9 cm (ref 0.6–0.9)
LATERAL E/E' RATIO: 24
LEFT ATRIUM SIZE: 5.1 cm (ref 2.7–3.8)
LEFT ATRIUM VOLUME: 91 mL (ref 22–52)
LEFT INTERNAL DIMENSION IN SYSTOLE: 5.4 cm (ref 2.2–3.5)
LEFT VENTRICLE DIASTOLIC VOLUME INDEX: 116 mL/m2 (ref 29–61)
LEFT VENTRICLE DIASTOLIC VOLUME: 216 mL (ref 46–106)
LEFT VENTRICLE SYSTOLIC VOLUME INDEX: 82 mL/m2 (ref 8–24)
LEFT VENTRICLE SYSTOLIC VOLUME: 152 mL — ABNORMAL LOW (ref 14–42)
LEFT VENTRICULAR INTERNAL DIMENSION IN DIASTOLE: 6.3 cm (ref 3.8–5.2)
MEDIAL E/E' RATIO: 24
MV MEAN GRADIENT: 4 mmHg
MV PEAK A VEL: 0.7 m/s
MV PEAK E VEL PW: 1.4 m/s
MV STENOSIS PRESSURE HALF TIME: 67 ms
MV VALVE AREA BY CONTINUITY EQUATION: 1.1 cm2
PISA MRMAX VEL: 4.9 m/s
PISA RADIUS: 0.9 cm
POSTERIOR WALL: 0.9 cm — ABNORMAL LOW (ref 0.6–0.9)
PROX AORTA: 3.5 cm (ref 1.9–3.5)
RA PRESSURE: 15
RIGHT ATRIAL AREA: 14 cm2 (ref <18)
RIGHT HEART SYSTOLIC MMODE TAPSE: 1.7 cm (ref >1.7)
RIGHT HEART SYSTOLIC TDI S': 0.1 m/s
RIGHT VENTRICULAR BASAL DIAMETER: 4.6 cm (ref 2.5–4.1)
RIGHT VENTRICULAR MID DIAMETER: 4.2 cm (ref 1.9–3.5)
RV SYSTOLIC PRESSURE: 58
SIMPSONS BIPLANE EF: 30 %
SINUS: 2.6 cm (ref 2.4–3.6)
TDI LATERAL E': 0 m/s
TDI MEDIAL E': 0 m/s
TR PEAK VELOCITY: 3.8 m/s
TV REST PULMONARY ARTERY PRESSURE: 73 mmHg

## 2023-04-12 LAB — NT-PRO-BNP
~~LOC~~ BKR NT-PRO-BNP: 122 pg/mL — ABNORMAL HIGH (ref 7–<450)
~~LOC~~ BKR NT-PRO-BNP: 113 pg/mL — ABNORMAL HIGH (ref <450)

## 2023-04-12 LAB — PTT (APTT): ~~LOC~~ BKR PTT: 35 s — ABNORMAL HIGH (ref 24.0–36.5)

## 2023-04-12 LAB — COVID INFLUENZA A/B AND RSV PCR

## 2023-04-12 LAB — URINALYSIS DIPSTICK REFLEX TO CULTURE
~~LOC~~ BKR LEUKOCYTES: NEGATIVE
~~LOC~~ BKR NITRITE: NEGATIVE
~~LOC~~ BKR URINE BILE: NEGATIVE

## 2023-04-12 LAB — DEVICE EVALUATION - ICD (IMPLANTABLE CARDIOVERTER DEFIBRILLATOR)
EP A PACING%: 1 %
EP DEVICE BATTERY VOLTAGE: 3
EP GENERATOR IMPLANT DATE: 202
EP GENERATOR SERIAL#: 810
EP RV PACING%: 1 %

## 2023-04-12 LAB — HIGH SENSITIVITY TROPONIN I, RANDOM: ~~LOC~~ BKR HIGH SENSITIVITY TROPONIN I: 28 ng/L — ABNORMAL HIGH (ref <12)

## 2023-04-12 LAB — URINALYSIS MICROSCOPIC REFLEX TO CULTURE
~~LOC~~ BKR HYALINE CAST: 5 /LPF — AB
~~LOC~~ BKR RBC, UA: 0 /HPF (ref 5.0–8.0)
~~LOC~~ BKR SQUAMOUS EPI CELLS: 0 /HPF
~~LOC~~ BKR WBC, UA: 0 /HPF — ABNORMAL LOW (ref 1.005–1.030)

## 2023-04-12 LAB — CBC
~~LOC~~ BKR MCV: 93 fL — ABNORMAL HIGH (ref 80.0–100.0)
~~LOC~~ BKR RBC COUNT: 2.7 10*6/uL — ABNORMAL LOW (ref 4.00–5.00)
~~LOC~~ BKR RBC COUNT: 2.6 10*6/uL — ABNORMAL LOW (ref 4.00–5.00)
~~LOC~~ BKR WBC COUNT: 11 10*3/uL — ABNORMAL HIGH (ref 4.50–11.00)
~~LOC~~ BKR WBC COUNT: 10 10*3/uL (ref 4.50–11.00)

## 2023-04-12 LAB — KC ED MAIN ECG TRIAGE ONLY
P AXIS: 79 degrees
P-R INTERVAL: 226 ms
Q-T INTERVAL: 356 ms
QRS DURATION: 112 ms
QTC CALCULATION (BAZETT): 400 ms
R AXIS: -27 degrees
T AXIS: -68 degrees
VENTRICULAR RATE: 76 {beats}/min

## 2023-04-12 LAB — COMPREHENSIVE METABOLIC PANEL
~~LOC~~ BKR BLD UREA NITROGEN: 36 mg/dL — ABNORMAL HIGH (ref 7–25)
~~LOC~~ BKR BLD UREA NITROGEN: 38 mg/dL — ABNORMAL HIGH (ref 7–25)
~~LOC~~ BKR CALCIUM: 8.3 mg/dL — ABNORMAL LOW (ref 8.5–10.6)
~~LOC~~ BKR CO2: 17 mmol/L — ABNORMAL LOW (ref 21–30)
~~LOC~~ BKR CREATININE: 2.7 mg/dL — ABNORMAL HIGH (ref 0.40–1.00)
~~LOC~~ BKR CREATININE: 2.7 mg/dL — ABNORMAL HIGH (ref 0.40–1.00)
~~LOC~~ BKR SODIUM, SERUM: 142 mmol/L — ABNORMAL LOW (ref 137–147)
~~LOC~~ BKR TOTAL PROTEIN: 5 g/dL — ABNORMAL LOW (ref 6.0–8.0)

## 2023-04-12 LAB — POC GLUCOSE
~~LOC~~ BKR POC GLUCOSE: 127 mg/dL — ABNORMAL HIGH (ref 70–100)
~~LOC~~ BKR POC GLUCOSE: 121 mg/dL — ABNORMAL HIGH (ref 70–100)
~~LOC~~ BKR POC GLUCOSE: 134 mg/dL — ABNORMAL HIGH (ref 70–100)
~~LOC~~ BKR POC GLUCOSE: 225 mg/dL — ABNORMAL HIGH (ref 70–100)
~~LOC~~ BKR POC GLUCOSE: 228 mg/dL — ABNORMAL HIGH (ref 70–100)

## 2023-04-12 LAB — LIPASE: ~~LOC~~ BKR LIPASE: 12 U/L — ABNORMAL LOW (ref 11–82)

## 2023-04-12 LAB — MAGNESIUM: ~~LOC~~ BKR MAGNESIUM: 2.1 mg/dL — ABNORMAL LOW (ref 1.6–2.6)

## 2023-04-12 LAB — HIGH SENSITIVITY TROPONIN I 4 HR
~~LOC~~ BKR HI SEN TNI DELTA 4-2: -7
~~LOC~~ BKR HIGH SENSITIVITY TROPONIN I 4 HOUR: 28 ng/L — ABNORMAL HIGH (ref <12)

## 2023-04-12 LAB — PHOSPHORUS: ~~LOC~~ BKR PHOSPHORUS: 5.4 mg/dL — ABNORMAL HIGH (ref 2.0–4.5)

## 2023-04-12 LAB — HIGH SENSITIVITY TROPONIN I 0 HOUR: ~~LOC~~ BKR HIGH SENSITIVITY TROPONIN I 0 HOUR: 34 ng/L — ABNORMAL HIGH (ref <12)

## 2023-04-12 LAB — HIGH SENSITIVITY TROPONIN I 2 HOUR
~~LOC~~ BKR HIGH SENSITIVITY TROPONIN I 2 HOUR: 35 ng/L — ABNORMAL HIGH (ref <12)
~~LOC~~ BKR HIGH SENSITIVITY TROPONIN I DELTA VALUE: 1

## 2023-04-12 LAB — POC LACTATE: ~~LOC~~ BKR POC LACTIC ACID: 1.4 mmol/L — ABNORMAL HIGH (ref 0.5–2.0)

## 2023-04-12 LAB — TSH WITH FREE T4 REFLEX: ~~LOC~~ BKR TSH: 2.6 [IU]/mL (ref 0.35–5.00)

## 2023-04-12 LAB — PROTIME INR (PT): ~~LOC~~ BKR PROTIME: 31 s — ABNORMAL HIGH (ref 10.2–12.9)

## 2023-04-12 MED ORDER — ALTEPLASE 2 MG IK SOLR
2 mg | INTRAMUSCULAR | 0 refills | Status: DC | PRN
Start: 2023-04-12 — End: 2023-04-16

## 2023-04-12 MED ORDER — ROSUVASTATIN 20 MG PO TAB
10 mg | Freq: Every day | ORAL | 0 refills | Status: DC
Start: 2023-04-12 — End: 2023-04-16
  Administered 2023-04-12 – 2023-04-15 (×4): 10 mg via ORAL

## 2023-04-12 MED ORDER — PERFLUTREN LIPID MICROSPHERES 1.1 MG/ML IV SUSP
1-10 mL | Freq: Once | INTRAVENOUS | 0 refills | Status: CP | PRN
Start: 2023-04-12 — End: ?
  Administered 2023-04-12: 15:00:00 2 mL via INTRAVENOUS

## 2023-04-12 MED ORDER — SERTRALINE 50 MG PO TAB
50 mg | Freq: Every day | ORAL | 0 refills | Status: DC
Start: 2023-04-12 — End: 2023-04-16
  Administered 2023-04-12 – 2023-04-15 (×4): 50 mg via ORAL

## 2023-04-12 MED ORDER — BUDESONIDE-FORMOTEROL 160-4.5 MCG/ACTUATION IN HFAA
2 | Freq: Two times a day (BID) | RESPIRATORY_TRACT | 0 refills | Status: DC
Start: 2023-04-12 — End: 2023-04-16
  Administered 2023-04-12: 16:00:00 2 via RESPIRATORY_TRACT

## 2023-04-12 MED ORDER — AMIODARONE 200 MG PO TAB
200 mg | Freq: Every day | ORAL | 0 refills | Status: DC
Start: 2023-04-12 — End: 2023-04-16
  Administered 2023-04-12 – 2023-04-15 (×4): 200 mg via ORAL

## 2023-04-12 MED ORDER — FUROSEMIDE 10 MG/ML IJ SOLN
40 mg | Freq: Once | INTRAVENOUS | 0 refills | Status: CP
Start: 2023-04-12 — End: ?
  Administered 2023-04-12: 09:00:00 40 mg via INTRAVENOUS

## 2023-04-12 MED ORDER — ONDANSETRON HCL (PF) 4 MG/2 ML IJ SOLN
4 mg | Freq: Once | INTRAVENOUS | 0 refills | Status: CP
Start: 2023-04-12 — End: ?
  Administered 2023-04-12: 12:00:00 4 mg via INTRAVENOUS

## 2023-04-12 MED ORDER — ALBUTEROL SULFATE 90 MCG/ACTUATION IN HFAA
2 | RESPIRATORY_TRACT | 0 refills | Status: DC | PRN
Start: 2023-04-12 — End: 2023-04-16

## 2023-04-12 MED ORDER — EMPAGLIFLOZIN 25 MG PO TAB
25 mg | Freq: Every day | ORAL | 0 refills | Status: DC
Start: 2023-04-12 — End: 2023-04-12

## 2023-04-12 MED ORDER — FUROSEMIDE 10 MG/ML IJ SOLN
40 mg | Freq: Every day | INTRAVENOUS | 0 refills | Status: DC
Start: 2023-04-12 — End: 2023-04-12

## 2023-04-12 MED ORDER — DEXTROSE 50 % IN WATER (D50W) IV SYRG
12.5-25 g | INTRAVENOUS | 0 refills | Status: DC | PRN
Start: 2023-04-12 — End: 2023-04-16

## 2023-04-12 MED ORDER — ONDANSETRON HCL (PF) 4 MG/2 ML IJ SOLN
4 mg | Freq: Once | INTRAVENOUS | 0 refills | Status: CP
Start: 2023-04-12 — End: ?
  Administered 2023-04-12: 07:00:00 4 mg via INTRAVENOUS

## 2023-04-12 MED ORDER — INSULIN ASPART 100 UNIT/ML SC FLEXPEN
0-6 [IU] | Freq: Every day | SUBCUTANEOUS | 0 refills | Status: DC
Start: 2023-04-12 — End: 2023-04-16
  Administered 2023-04-12: 19:00:00 2 [IU] via SUBCUTANEOUS

## 2023-04-12 MED ORDER — MELATONIN 5 MG PO TAB
5 mg | Freq: Every evening | ORAL | 0 refills | Status: DC | PRN
Start: 2023-04-12 — End: 2023-04-16
  Administered 2023-04-14: 06:00:00 5 mg via ORAL

## 2023-04-12 MED ORDER — FUROSEMIDE 10MG/ML (5-40 KG) IVP
40 mg | Freq: Once | INTRAVENOUS | 0 refills | Status: DC
Start: 2023-04-12 — End: 2023-04-12

## 2023-04-12 MED ORDER — ACETAMINOPHEN 325 MG PO TAB
650 mg | ORAL | 0 refills | Status: DC | PRN
Start: 2023-04-12 — End: 2023-04-16

## 2023-04-12 MED ORDER — PANTOPRAZOLE 40 MG PO TBEC
40 mg | Freq: Two times a day (BID) | ORAL | 0 refills | Status: DC
Start: 2023-04-12 — End: 2023-04-16
  Administered 2023-04-12 – 2023-04-15 (×7): 40 mg via ORAL

## 2023-04-12 MED ORDER — SODIUM CHLORIDE 0.9 % IJ SOLN
10 mL | Freq: Once | INTRAVENOUS | 0 refills | Status: CP
Start: 2023-04-12 — End: ?
  Administered 2023-04-12: 15:00:00 10 mL via INTRAVENOUS

## 2023-04-12 MED ORDER — APIXABAN 5 MG PO TAB
5 mg | Freq: Two times a day (BID) | ORAL | 0 refills | Status: DC
Start: 2023-04-12 — End: 2023-04-16
  Administered 2023-04-12 – 2023-04-15 (×6): 5 mg via ORAL

## 2023-04-12 MED ORDER — FUROSEMIDE 10 MG/ML IJ SOLN
40 mg | Freq: Once | INTRAVENOUS | 0 refills | Status: CP
Start: 2023-04-12 — End: ?
  Administered 2023-04-12: 22:00:00 40 mg via INTRAVENOUS

## 2023-04-12 MED ORDER — CARVEDILOL 3.125 MG PO TAB
3.125 mg | Freq: Two times a day (BID) | ORAL | 0 refills | Status: DC
Start: 2023-04-12 — End: 2023-04-16
  Administered 2023-04-12: 15:00:00 3.125 mg via ORAL

## 2023-04-12 MED ORDER — MILRINONE IN 5 % DEXTROSE 20 MG/100 ML (200 MCG/ML) IV PGBK
.3 ug/kg/min | INTRAVENOUS | 0 refills | Status: DC
Start: 2023-04-12 — End: 2023-04-16
  Administered 2023-04-12 – 2023-04-15 (×6): 0.3 ug/kg/min via INTRAVENOUS

## 2023-04-12 NOTE — Progress Notes
PHYSICAL THERAPY  NOTE      Name: Raven [name]   MRN: [id number]     DOB: 02-24-1946      Age: 77 y.o.  Admission Date: 04/11/2023     LOS: 1 day     Date of Service: 04/12/2023      Based on discussion with OT, patient's current level of function suggests that patient will progress with one discipline. PT will sign off at this time. Please re-consult if a change in functional status occurs.     Therapist: Criselda Peaches, PT  Date: 04/12/2023

## 2023-04-12 NOTE — Case Management (ED)
Case Management Progress Note    NAME:Raven [name]                          MRN: [id number]              DOB:03-May-1945          AGE: 77 y.o.  ADMISSION DATE: 04/11/2023             DAYS ADMITTED: LOS: 1 day      Today's Date: 04/12/2023    PLAN: D/c home with resumption of care home health with [facility] and [facility] for IV milrinone    Expected Discharge Date:    Is Patient Medically Stable: No, Please explain: IV lasix dose given today  Are there Barriers to Discharge? no    INTERVENTION/DISPOSITION:  Discharge Planning              Discharge Planning: Home Health, Home Infusion-Enteral-[facility]    NCM called [facility] and spoke with intake Kaitlyn to notify of observation admission. Kaitlyn informed that patient is current with RN, PT, and OT services. Kaitlyn's direct line is : [phone].  NCM reached out to [facility] representative Aubrei to inform of patient's admission.  NCM sent clinicals to agencies via Epic.  Will send resumption of care orders once patient is stable for discharge.    Transportation                 Support                 Info or Referral                 Positive SDOH Domains and Potential Barriers                   Medication Needs                                                                                                                                                         Estate manager/land agent                 Other                 Discharge Disposition  Selected Continued Care - Admitted Since 04/11/2023       [facility]       Service Provider Services Address Phone Fax Patient Preferred    [facility] [address] Garry He[place] [address] [phone] --              [facility]       Service Provider Services Address Phone Fax Patient Preferred [facility] [address], [place] [address] [phone] --                  Para March, RNCM  Available on [place]  Office: [phone]

## 2023-04-12 NOTE — Progress Notes
Continuum of Care    Multi Visit Patient - Initial Assessment      NAME: Raven [name]          MRN: [id number]          DOB: [birth year]          AGE: 77 y.o.    Utilization: ED Visits: 5 Admissions: 5 within the past 12 months    Source of Information: Patient & EMR     Care Planning Considerations:   Consider coordinating care with son, [name], as he manages pt's healthcare   Consider medication education with son at discharge & at OP follow up appointments   Continue to provide clear communication of plan of care   Consider coordinating with pt's new [facility] to assist with medications     Interventions:   [facility] met with patient at bedside. Introduced self and MVP team purpose/goals. Patient voiced understanding, is agreeable to discussion. Phone call to pt's son, [name], as approved by pt.   Explained the follow up that the MVP team will provide upon future ED presentations/unplanned IP admissions, to address ongoing barriers which may hinder OP success. Provided business card, encouraged contact as needs arise.  Assessed for challenges/barriers which may hinder OP success.   Explored established support systems that the patient can further leverage to achieve OP success.   Verified OP providers and assessed utilization of services.     Drivers of Utilization History: Patient is a 77 y.o. female enrolled into the MVP program due to a higher than average utilization of IP admissions in the past 12 months. Patient has an increasing IP utilization pattern beginning May 2024. At this time, the patient appears to have a pattern of presenting to the health system due to heart failure symptoms and GI bleed.   Barriers: pt with progressive heart failure with limited therapies available; complex health conditions leading to complicated medications; concerns for communication with clinics to triage symptoms     Strengths: supportive son managing health care needs; transition to a new pharmacy to allow medications be in bubble packs;     Qualifying Admissions Include:   12/6-12/13/24: GI bleed   11/27-12/4/24: GI Bleed   7/7-7/16/24: Heart Failure  5/22-5/29/24: Heart Failure     Physical Health:   Entry Point Pattern:   Pt is typically admitted following presentation to the ED due to abnormal labs or as advised from cardio clinic.  Medications:   Pt reported medications are affordable.   Pt reported they are consistent in medication intake.   Pt reported  adult son  assists in managing medications.   Son reported managing pt's medications can be very difficult as  specialities clinics & hospitalizations can change pt's medications   Healthcare Management:   Pt is typically able to attend their OP appointments.   When experiencing new/worsening symptoms, pt is contacting their OP providers for guidance. Son reported there is difficulty in connecting with clinics. He reports playing phone tag with clinics.   Pt and son are able to connect with PCP more frequently.   Current PCP (Clinic Location/Name): External: Dr. Barbaraann Rondo [phone])  Current specialty physicians (Clinic Location/Specialty/Name): [facility]: Dr. Vanetta Shawl [phone]); External Neph: Dr. Carin Primrose [phone]); External [employer]:UXLKG McFadden, APRN     Living Situation:   Lives with Family  Identified concern for accessibility within the residence: No  If yes, explain: NA  Identified concern for stability of their living arrangement: No  If yes,  explain: NA    Mental Health:    Current Mental Health:  Need to continue to assess   History of Mental Health:  Identified history of mental health concerns: No  If yes, concerns include:  NA  Previous behavioral health provider(s) and participation: Per EMR no identified mental health concerns     Substance Use:   Current Substance Use:  Identified current substance use: No  If yes, use includes:  NA  Current substance use treatment and participation: NA  Pt denied need for supportive resources at this time.   History of Substance Use:  Identified history of substance use: No  If yes, use includes:  NA  Previous substance use treatment and participation: NA    Community/Financial Resources:   Support system includes supportive adult son    Insurance: Medicare.  Identified concern for income/affordability of living expenses: No  If yes, concerns include: NA  Pt denied need for community resources at this time.     Transportation:   Available mode(s) of transportation: son   Preferred mode(s) of transportation: son   Pt denied need for transportation resources at this time.       MVP team to continue to follow and provide further care planning considerations, as appropriate.    Kandee Keen  Available on [place]  Office # [phone]

## 2023-04-12 NOTE — Progress Notes
04/12/23 1100   Current Cardiac Procedures/Events   Pre Admit Dx Other (comment)   EF 30 %   Risk Factors   Risk Factors Obesity;Hypertension;Hyperlipidemia;Diabetes-type II   Education   Person Instructed Patient   Patient Barriers To Learning None Noted   Interventions/Teaching Methods Verbal Instructions;Written Materials Provided   Patient Response Verbalized Understanding   Topics Reasons to call your doctor;Risk Factor Management - Blood Pressure;Risk Factor Management - Diabetes;Risk Factor Management - Cholesterol;Risk Factor Management - Obesity;Risk Factor Management - Stress;Risk Factor Management - Physical Inactivity;Home Walking Guidelines;Outpatient Cardiac Rehab Referral Information   Teaching Completed 04/12/23   Heart Resource Manual Given 04/12/23   Outpatient Cardiopulmonary Rehab   OPCR No   Why? No Transportation

## 2023-04-12 NOTE — Progress Notes
Home Infusion Transfer Summary - to Receiving Agency:  [facility]    Learned on 04/12/23 that patient was hospitalized on 04/11/23 for acute on chronic heart failure with reduced ejection fraction.    [facility] has been providing the following treatment to the patient:    Drug:  milrinone 0.3 mcg/kg/min at 78.3 kg IV via continuous infusion        Administration method:  CADD [facility] ambulatory pump  IV Access:  SL Port  Last dressing change:  n/a  Labs last drawn:  04/09/23 during previous admission  Nursing provided by:  [facility]; Ph: [phone]  Last visit:  unknown    Progress toward goals: patient recently discharged from [facility] on 04/09/23 with the same dose of milrinone as she was on previously.  Upon this current admission, it was discovered that patient's milrinone pump was possibly off since 04/11/2023 ~1200, prior to admission (see notes).  Patient's son [name] assists with care and with home IV milrinone therapy.    Community Resources:  home health listed above    When the patient is prepared to return home, Emogene Morgan will be able to resume care once we receive physician orders specific to the care that is to resume.      Submitted by:  Milbert Coulter, PharmD   Date/time: 04/12/23 at 9:57am    [facility] - Phone: [phone]; Fax: [phone]

## 2023-04-12 NOTE — Care Coordination-Inpatient
Med Private Night [phone] Raven [name] (available on Voalte) will take calls on this patient until 8 am on 04/12/2023. Afterwards, please contact Med-Private Q team for any questions or concerns. Voalte is the preferred way of communication.     Tery Sanfilippo, M.D.  Night AOD  On Voalte ---- 8 pm to 8 am    .

## 2023-04-12 NOTE — Progress Notes
RT Adult Assessment Note    NAME:Raven [name]             MRN: [id number]             DOB:Aug 30, 1945          AGE: 77 y.o.  ADMISSION DATE: 04/11/2023             DAYS ADMITTED: LOS: 0 days    Additional Comments:  Impressions of the patient: Nd  Intervention(s)/outcome(s): RT Eval. OSA hx, placed on hospital CPAP    Vital Signs:  Pulse: 76  RR: 18 PER MINUTE  SpO2: 97 %  O2 Device: CPAP/BiPAP  Liter Flow:    O2%: 21 %    Breath Sounds:   All Breath Sounds: Clear (Implies normal);Decreased  Respiratory Effort:   Respiratory Effort/Pattern: Unlabored  Comments:

## 2023-04-12 NOTE — Progress Notes
OCCUPATIONAL THERAPY  ASSESSMENT NOTE      Name: Raven [name]   MRN: [id number]     DOB: August 12, 1945      Age: 77 y.o.  Admission Date: 04/11/2023     LOS: 1 day     Date of Service: 04/12/2023      Mobility  Patient Turn/Position: Chair  Progressive Mobility Level: Walk in hallway  Distance Walked (feet): 100 ft (+ seated break + ~117ft)  Level of Assistance: Assist X1  Assistive Device: Walker  Activity Limited By: Fatigue    Subjective  Significant Hospital Events: 77 year old woman admitted with HFrEF exacerbation. 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  Mental / Cognitive: Alert;Oriented;Cooperative;Follows commands  Pain: No complaint of pain  Pain Interventions: Patient assisted into position of comfort  Comments: On entry/exit, pt in bedside chair w/ alarm on and all needs within reach. RN notified.    Home Living Situation  Lives With: Family (lives with her son and daughter-in-law)  Type of Home: House  Entry Stairs: 2;Rail on both sides  In-Home Stairs: 1 flight;Stair glide  Bathroom Setup: Tub/shower unit  Patient Owned Equipment: Bathing: Soil scientist: Public house manager)  Comments: Reports use of RW within home and use of electric scooter for community distances.    Prior Level of Function  Level Of Independence: Independent with ADL and household mobility with device;Assist needed for IADL  Comments: Pt reports independence w/ ADLs (dressing, showering, toileting). Pt reports his son and DIL assist w/ IADLs (cooking, cleaning, laundry).  Required Assist For: All home functioning ADL;Meal preparation;Medication/finance management;Transportation/driving  History of Falls in Past 3 Months: No    Vision  Corrective Lenses: No hx of corrective lenses    ADL's  Where Assessed: Chair  UE Dressing Assist: Minimal Assist  UE Dressing Deficits: Thread RUE;Thread LUE;Pull Around Back  Comment: Requires min A to don robe while seated in chair.    ADL Mobility  Transfer Type: Sit to stand  Transfer: Assistance Level: To/from;Bedside chair (CGA; to/from hallway couch)  Transfer: Assistive Device: Roller walker  Transfer: Type of Assistance: For safety considerations  End of Activity Status: Up in chair;Instructed patient to request assist with mobility;Instructed patient to use call light;Nursing notified  Transfer Comments: Completes all transfers w/ CGA.  Sitting Balance: Static sitting balance;Independent  Standing Balance: Static standing balance;2 UE support;Independent;Dynamic standing balance;Standby assist  Gait Distance: 100 feet (+ seated break + ~155ft)  Gait: Assistance Level: Standby assist;Management of lines;Safety considerations  Gait: Assistive Device: Roller walker  Gait Comments: Ambulates total of ~235ft (~187ft + seated break + ~136ft) w/ RW and SBA. Steady gait with no LOB.    Activity Tolerance  Endurance: 3/5 Tolerates 25-30 Minutes Exercise w/Multiple Rests  Comment: Activity limited by pt fatigue. Pt reports feeling winded post-activity.    Cognition  Social Interaction: Interacts in a Network engineer  Orientation: Alert & Oriented x4  Attention: Awake/Alert    ROM  R UE ROM: WFL   R UE ROM Method: Active  L UE ROM: WFL   L UE ROM Method: Active  Coordination: Adequate to Complete ADLs  Grasp: Bilateral Grasp Functional for Activity    Edema  RUE Edema: No Significant Edema  LUE Edema:  No Significant Edema  R Hand Edema: No Significant Edema  L Hand Edema: No Significant Edema    Sensory  Overall Sensory: Bilateral Intact    Education  Persons Educated: Patient  Barriers To Learning: None Noted  Teaching Methods: Verbal Instruction  Patient Response: Verbalized Understanding  Topics: Role of OT, Goals for Therapy  Goal Formulation: With Patient    Assessment  Assessment: Decreased Endurance;Decreased High-Level ADLs  Prognosis: Good;w/ Family  Goal Formulation: Patient  Comments: Pt is currently primarily limited by generalized deconditioning. OT will continue to follow to maximize independence and safety with ADLs and functional mobility throughout acute admission.     AM-PAC 6 Clicks Daily Activity Inpatient  Putting on and taking off regular lower body clothes: A Little  Bathing (Including washing, rinsing, drying): A Little  Toileting, which includes using toilet, bedpan, or urinal: A Little  Putting on and taking off regular upper body clothing: A Little  Taking care of personal grooming such as brushing teeth: A Little  Eating meals: None  Daily Activity Raw Score: 19  Standardized (T-scale) Score: 40.22    Plan  OT Frequency: 3-5x/week  OT Plan for Next Visit: stairs; standing ADLs at sink; toileting + pericare    ADL Goals  Patient Will Perform Grooming: Standing at Sink;w/ Stand By Assist  Patient Will Perform Toileting: w/ Minimum Assist    Functional Transfer Goals  Pt Will Perform All Functional Transfers: w/ Stand By Assist    OT Discharge Recommendations  Recommendation: Home with intermittent supervision/assistance  Patient Currently Requires Physical Assist With: All home functioning ADLs;Bathing;Dressing;Stairs  Patient Currently Requires Supervision For: ADLs;Mobility  Patient Currently Requires Equipment: Owns what is needed    Therapist: Burnadette Pop, OTD, OTR/L  Date: 04/12/2023

## 2023-04-12 NOTE — Consults
Heart Failure Consult    NAME:Raven [name]             MRN: [id number]                 DOB:August 14, 1945          AGE: 77 y.o.  ADMISSION DATE: 04/11/2023             DAYS ADMITTED: LOS: 1 day      Principal Problem:    Acute on chronic HFrEF (heart failure with reduced ejection fraction) (HCC)          Reason for Consultation:  Evaluation and recommendations re: Heart failure and management of diuretics        History of Present Illness: Raven [name] is a 77 y.o. 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 She was recently admitted and discharged from 04/02/2023 till 04/09/23 for GI bleed and required 2 units of pRBC. Her home torsemide was held due to bump in her creatinine with plans to follow up with Cardiology outpatient.    After the discharge, patient on 12/14 started to have SOB with minimum exertion and eventually during the rest and she reports fluid buildup mainly on her legs and abdomen and she had abdominal bloating/she was not able even to drink water. She also endorsees orthopnea. Furthermore, patient million pump was found to be off since 04/11/2023 noon when her son helped her and changing it afterward she slept and woke up and never checked the pump again till she presents the hospital she reported she may was sleeping and did not hear the alarm.  On admission, patient was given lasix 40 mg IV once.      Recommendations:  Diuretics: Continue with lasix 40 mg IV daily for now and will re-assess the patient's response  GDMT: Hold home coreg for now , currently other GDMT is not an option due to her kidney function.   Continue home milrinone at 0.3 for now   Standing daily weight as well as strict I&Os       Ongoing:  BMP once a day. Magnesium level daily.  Keep Potassium greater than 4.0 and Magnesium greater than 2.0.  2000mg  sodium dietary restriction.  Fluid Restriction:1.5  Strict I/O. Goal output: net neg 2L/24 hour  Daily standing scale weight. Goal Dry Weight: TBD    Please place a specimen in lab NT-proBNP order on day of discharge in effort to establish baseline value correlating with suspected euvolemia.      Primary team responsible for placing Post-Discharge Health System Appointment Request order set for Cardiology: Heart Failure appointment request within 24-48 hours of discharge. (Please do not place order any earlier in effort to reduce possible need for cancellation and rescheduling of visit).    Subjective:    She reports feeling better and able to lay flat and breath comfortably. Her appetite has improved. She hasn't had a bowel movement in the last 3 days.     Review of Systems:  General: Denies fever, chills, malaise, or night sweats, +ve significant weight gain  HEENT: Normal conjunctiva , no congestion, no sore throat  Pulm: negative for cough, hemoptysis. +ve shortness of breath at rest  CV: as per HPI  GI: negative for N/V, abdominal pain, blood in stools, constipation, diarrhea  GU: negative for dysuria,   MSK: No joint pain, joint swelling, muscle pain/weakness  Neuro: negative for headaches, numbness/tingling, seizures, tremors  Heme/Lymph: negative for bleeding problems  Derm: No rashes, dry skin, or skin lesions      Objective:                       Vital Signs:  Last Filed                Vital Signs: 24 Hour Range   BP: 97/50 (12/16 0838)  Temp: 36.5 ?C (97.7 ?F) (12/16 4540)  Pulse: 74 (12/16 0838)  Respirations: 17 PER MINUTE (12/16 0838)  SpO2: 92 % (12/16 0838)  O2%: 21 % (12/16 0455)  O2 Device: CPAP/BiPAP (12/16 0838)  Height: 157.5 cm (5' 2) (12/16 0425)  BP: (92-101)/(50-59)   Temp:  [36.2 ?C (97.2 ?F)-36.6 ?C (97.8 ?F)]   Pulse:  [72-78]   Respirations:  [17 PER MINUTE-27 PER MINUTE]   SpO2:  [92 %-100 %]   O2%:  [21 %]   O2 Device: CPAP/BiPAP           Wt Readings from Last 10 Encounters:   04/12/23 78.8 kg (173 lb 11.6 oz)   04/06/23 75.9 kg (167 lb 5.3 oz)   03/26/23 74.2 kg (163 lb 9.3 oz)   03/19/23 77 kg (169 lb 12.8 oz)   02/16/23 75.3 kg (166 lb)   02/15/23 75.3 kg (166 lb)   01/04/23 75.2 kg (165 lb 12.8 oz)   12/14/22 74.6 kg (164 lb 6.4 oz)   11/30/22 79.2 kg (174 lb 9.6 oz)   11/17/22 78.4 kg (172 lb 12.8 oz)       Physical Exam:    General Appearance: no distress, overweight  Neck Veins: JVP is elevated but patient also has severe TR, HJR positive  Auscultation: breathing comfortably, lungs clear to auscultation, no rales or rhonchi, no wheezing  Cardiac Auscultation: Regular rhythm, S1, S2, no S3 or S4, pan systolic murmur  Lower Extremity Edema: 2+ bilateral LL edema    Abdominal Exam: soft, non-tender, bowel sounds normal  Orientation: clear historian, good insight      Assessment:   Acute Exacerbation systolic and diastolic HFrEF,  EF: 30%.   Major Complications or Comorbidities [facility]): acute respiratory failure and acute/ acute on chronic systolic and/or diastolic heart failure  NYHA functional class IV (unable to carry on any physical activity without symptoms of HF, or symptoms of HF at rest),   ACC Stage D (refractory HF requiring specialized interventions).   She presents with signs of hypervolemia with Left  ventricular failure with signs of low flow state.    BNP Labs:   Lab Results   Component Value Date    NTPROBNP 12,291 (H) 04/11/2023    NTPROBNP 3,269 (H) 02/17/2023    NTPROBNP 14,358.0 (H) 11/01/2022       High Sensitivity Troponin:  Lab Results   Component Value Date    HSTROP0HR 34 (H) 04/11/2023    HSTROP2HR 35 (H) 04/12/2023    HSTROP4HR 28 (H) 04/12/2023    HSTROPDELT4 -7 04/12/2023    HSTROPDELTA 1 04/12/2023 HIGHSTROPI 28 (H) 04/12/2023         Admission Weight: 80.7 kg (178 lb)        Most  recent weights (inpatient):   Vitals:    04/11/23 2256 04/12/23 0425   Weight: 80.7 kg (178 lb) 78.8 kg (173 lb 11.6 oz)        I/O:       Intake/Output Summary (Last 24 hours) at 04/12/2023 0951  Last data filed at 04/12/2023 1610  Gross per 24 hour   Intake 0 ml   Output 0 ml   Net 0 ml     Net IO Since Admission: 0 mL [04/12/23 0951]      PLAN:  Diuretic Therapy    Prior to admission dose    Torsemide 20 mg daily was held prior to last discharge   Given on admission    Lasix 40 mg IV once    Daily Dosing   Lasix 40 mg IV daily      Guideline Directed Medical Therapy PTA Inpatient Changes   ACEI/ARB No  Renal dysfunction    ARNI No Renal dysfunction    BB Yes, Coreg 3.125 mg BID  Held on 12/26   SGLT-2 Inhibitor Hx of UTI and low GFR     Mineralocorticoid Receptor Antagonist No Renal dysfunction    Hydralazine/Nitrate No Hypotension    Ivabradine No; Hypotension    Heart Rhythm Management Therapy Yes (CRT-D)    Anticoagulation for Afib/flutter apixaban (Eliquis) 5 mg BID    Cardiac Rehab Evaluation for LVEF < or equal to 40% Yes; Date ordered: 04/12/2023    7-Day post hospital follow up scheduled within 24-48 hours of discharge No Discharge not expected within the next 24-48 hours        Most Recent Cardiac Testing/Procedures        #CAD S/P PCI and CABG   - trop 34-35-28     # AKI on CKD  with baseline 2.0 - 2.2   - Could be cardiorenal   - Will see if respond to lasix     #AFIB  - continue eliquis 5 mg BID   - on home amiodarone 200 mg daily   - continue tele     #CRTD in situ    Past Medical History:    Anxiety    Arthritis    Asthma    Asymptomatic stenosis of right carotid artery    Back pain    Cancer of uterus (HCC)    COPD (chronic obstructive pulmonary disease) (HCC)    Coronary artery disease    Diabetes mellitus (HCC)    Dyslipidemia    Flank mass    Fluid retention    GI bleed    Heart disease    Hypertension    Other malignant neoplasm without specification of site    Sleep apnea    Vision decreased     Surgical History:   Procedure Laterality Date    ROBOT ASSISTED LOW ANTERIOR RESECTION N/A 08/10/2022    Performed by Benetta Spar, MD at [facility] OR    ESOPHAGOGASTRODUODENOSCOPY WITH BIOPSY - FLEXIBLE N/A 09/17/2022    Performed by Tempie Hoist, DO at [facility] ENDO    CATHETERIZATION RIGHT HEART N/A 09/18/2022    Performed by Cath, Physician at [facility] CATH LAB    REMOVAL AND REPLACEMENT IMPLANTABLE DEFIBRILLATOR GENERATOR - SINGLE LEAD SYSTEM Left 11/04/2022    Performed by Kathreen Cornfield, MD at [facility] EP LAB    INSERTION/ REPLACEMENT PERMANENT PACEMAKER WITH ATRIAL LEAD Left 11/04/2022    Performed by Kathreen Cornfield, MD at [facility] EP LAB  Injection Venography Extremity Left 11/04/2022    Performed by Kathreen Cornfield, MD at [facility] EP LAB    ESOPHAGOGASTRODUODENOSCOPY WITH BIOPSY - FLEXIBLE N/A 03/29/2023    Performed by Buckles, Vinnie Level, MD at [facility] ENDO    ESOPHAGOGASTRODUODENOSCOPY WITH SNARE REMOVAL TUMOR/ POLYP/ OTHER LESION - FLEXIBLE N/A 03/29/2023    Performed by Buckles, Vinnie Level, MD at [facility] ENDO    ESOPHAGOGASTRODUODENOSCOPY WITH CONTROL OF BLEEDING - FLEXIBLE N/A 04/04/2023    Performed by Buckles, Vinnie Level, MD at [facility] ENDO    BONE MARROW BIOPSY      CARDIAC DEFIBRILLATOR PLACEMENT      [employer]    CARDIAC SURGERY      CABG 3 V    CAROTID ENDARDECTOMY Right     COLONOSCOPY      HX CORONARY STENT PLACEMENT      HX HEART CATHETERIZATION      HX HYSTERECTOMY      TUNNELED VENOUS PORT PLACEMENT Right     Chest     Family History   Problem Relation Name Age of Onset    Cancer Mother      Diabetes Mother      Hypertension Mother      Cancer-Breast Sister      Cancer-Ovarian Sister      Cancer Sister      Diabetes Sister      Heart Disease Sister      Hypertension Sister      Heart Disease Brother      Hypertension Brother      Stroke Brother       Social History     Socioeconomic History    Marital status: Widowed   Tobacco Use    Smoking status: Former     Current packs/day: 0.00     Average packs/day: 1 pack/day for 20.0 years (20.0 ttl pk-yrs)     Types: Cigarettes     Start date: 54     Quit date: 2010     Years since quitting: 14.9     Passive exposure: Never    Smokeless tobacco: Never   Vaping Use    Vaping status: Never Used   Substance and Sexual Activity    Alcohol use: Not Currently    Drug use: Not Currently        Allergies:   Allergies   Allergen Reactions    Bortezomib RASH     Noted in Provider note 8/22 cancer treatment drug    Allopurinol HIVES    Sulfa (Sulfonamide Antibiotics) NAUSEA ONLY        Medications:  Scheduled Meds:amiodarone (CORDARONE) tablet 200 mg, 200 mg, Oral, QDAY  apixaban (ELIQUIS) tablet 5 mg, 5 mg, Oral, BID  budesonide-formoterol HFA (SYMBICORT) 160-4.5 mcg/actuation inhalation 2 puff, 2 puff, Inhalation, BID  carvediloL (COREG) tablet 3.125 mg, 3.125 mg, Oral, BID w/meals  insulin aspart (U-100) (NOVOLOG FLEXPEN U-100 INSULIN) injection PEN 0-6 Units, 0-6 Units, Subcutaneous, 5 X Daily  rosuvastatin (CRESTOR) tablet 10 mg, 10 mg, Oral, QDAY  sertraline (ZOLOFT) tablet 50 mg, 50 mg, Oral, QDAY    Continuous Infusions:   milrinone (PRIMACOR) 20 mg/D5W 100 mL infusion 0.3 mcg/kg/min (04/12/23 0519)     PRN and Respiratory Meds:acetaminophen Q4H PRN, albuterol sulfate Q4H PRN, alteplase PRN (On Call from Rx), dextrose 50% (D50) IV PRN, melatonin QHS PRN    Medications Prior to Admission   Medication Sig Dispense Refill Last Dose/Taking    acetaminophen (TYLENOL) 325 mg  tablet Take three tablets by mouth every 8 hours as needed for Pain. Indications: pain 40 tablet 0     acyclovir (ZOVIRAX) 400 mg tablet Take one tablet by mouth twice daily.       albuterol sulfate (PROAIR HFA) 90 mcg/actuation HFA aerosol inhaler Inhale two puffs by mouth into the lungs every 6 hours as needed for Wheezing or Shortness of Breath.       amiodarone (CORDARONE) 200 mg tablet Take one tablet by mouth daily. Indications: prevention of recurrent atrial fibrillation 90 tablet 1     apixaban (ELIQUIS) 5 mg tablet Take one tablet by mouth twice daily. Indications: afib 60 tablet 0     budesonide-formoterol HFA (SYMBICORT) 160-4.5 mcg/actuation aerosol inhaler Inhale two puffs by mouth into the lungs twice daily. Indications: bronchospasm prevention with COPD 30.6 g 0     carvediloL (COREG) 3.125 mg tablet Take one tablet by mouth twice daily with meals. Take with food.  Indications: heart failute with rEF 180 tablet 0     cholecalciferol (VITAMIN D) 1,000 units tablet Take one tablet by mouth daily.       cyanocobalamin (VITAMIN B-12) 1,000 mcg tablet Take 5,000 Units by mouth daily.       dexAMETHasone (DECADRON) 4 mg tablet Take two tablets by mouth every 7 days. Patient is taking 8mg  on Thursdays prior to chemo       empagliflozin ([name]) 25 mg tablet Take one tablet by mouth daily.       EMU OIL (BULK) MISC Use  as directed. Apply topically as needed       febuxostat (ULORIC) 40 mg tablet Take one-half tablet by mouth daily. Indications: taken on chemo days 45 tablet 0     ferrous sulfate (FEOSOL) 325 mg (65 mg iron) tablet Take one tablet by mouth daily. Take on an empty stomach at least 1 hour before or 2 hours after food.       insulin detemir U-100 (LEVEMIR FLEXTOUCH) 100 unit/mL (3 mL) injection pen Resume after speaking with your PCP or endocrinology  Indications: type 2 diabetes mellitus       milrinone lactate/D5W (MILRINONE IN D5W IV) Administer  through vein. Milrinone 80mg /238ml  0.30mcg/kg/min intravenously continuous infusion. Dosing wt 78.3 kg       nitroglycerin (NITROSTAT) 0.4 mg tablet Place one tablet under tongue every 5 minutes as needed for Chest Pain.       pantoprazole DR (PROTONIX) 40 mg tablet Take one tablet by mouth twice daily. Indications: gastritis 112 tablet 0     potassium chloride SR (K-DUR) 20 mEq tablet Resume after cardiology follow up  Indications: prevention of low potassium in the blood rosuvastatin (CRESTOR) 20 mg tablet Take one-half tablet by mouth daily. Indications: excessive fat in the blood 90 tablet 0     sertraline (ZOLOFT) 50 mg tablet Take one tablet by mouth daily.       torsemide (DEMADEX) 20 mg tablet Resume after cardiology follow up  Indications: accumulation of fluid resulting from chronic heart failure           Laboratory Review:   CBC w/Diff    Lab Results   Component Value Date/Time    WBC 10.10 04/12/2023 04:31 AM    RBC 2.64 (L) 04/12/2023 04:31 AM    HGB 7.9 (L) 04/12/2023 04:31 AM    HCT 24.6 (L) 04/12/2023 04:31 AM    MCV 93.0 04/12/2023 04:31 AM    MCH 29.8 04/12/2023 04:31 AM  MCHC 32.0 04/12/2023 04:31 AM    RDW 21.5 (H) 04/12/2023 04:31 AM    PLTCT 290 04/12/2023 04:31 AM    MPV 8.6 04/12/2023 04:31 AM    Lab Results   Component Value Date/Time    NEUT 62 03/31/2023 03:42 AM    ANC 7.10 (H) 03/31/2023 03:42 AM    LYMA 15 (L) 03/31/2023 03:42 AM    ALC 1.80 03/31/2023 03:42 AM    MONA 13 (H) 03/31/2023 03:42 AM    AMC 1.50 (H) 03/31/2023 03:42 AM    EOSA 8 (H) 03/31/2023 03:42 AM    AEC 0.90 (H) 03/31/2023 03:42 AM    BASA 1 03/31/2023 03:42 AM    ABC 0.10 03/31/2023 03:42 AM         Chemistry    Lab Results   Component Value Date/Time    NA 142 04/12/2023 04:31 AM    K 4.6 04/12/2023 04:31 AM    CL 112 (H) 04/12/2023 04:31 AM    CO2 18 (L) 04/12/2023 04:31 AM    GAP 11 04/12/2023 04:31 AM    BUN 38 (H) 04/12/2023 04:31 AM    CR 2.76 (H) 04/12/2023 04:31 AM    GLU 119 (H) 04/12/2023 04:31 AM    MG 2.1 04/12/2023 04:31 AM    Lab Results   Component Value Date/Time    CA 8.3 (L) 04/12/2023 04:31 AM    PO4 5.4 (H) 04/12/2023 04:31 AM    ALBUMIN 3.2 (L) 04/12/2023 04:31 AM    TOTPROT 5.0 (L) 04/12/2023 04:31 AM    ALKPHOS 92 04/12/2023 04:31 AM    AST 30 04/12/2023 04:31 AM    ALT 20 04/12/2023 04:31 AM    TOTBILI 0.9 04/12/2023 04:31 AM    GFR 17 (L) 04/12/2023 04:31 AM          Lipid Profile INR   Lab Results   Component Value Date    CHOL 107 09/16/2022    TRIG 72 09/16/2022    HDL 50 09/16/2022    LDL 45 09/16/2022    VLDL 14 09/16/2022    NONHDLCHOL 57 09/16/2022         Lab Results   Component Value Date    INR 3.0 (H) 04/11/2023          Chest X-Ray:  04/11/2023 CHF, with moderate cardiomegaly, pulmonary edema, and small   pleural effusions.      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 with PR of 226     Echocardiogram Details on 04/12/23 :     Eccentric hypertrophy of the left ventricle with severe dilatation of the left ventricular chamber  Left ventricular systolic function is moderately to severely impaired with an estimate ejection fraction of 30% and global hypokinesis  Right ventricle is moderately dilated with normal function  The left atrium is severely dilated  Pacemaker lead seen in the right atrium and right ventricle  Severe mitral valve insufficiency  Severe tricuspid valve insufficiency  No pericardial effusion  Severe pulmonary hypertension with estimated peak systolic pulmonary artery pressure of 73 mmHg     In comparison to previous echocardiogram from May 2024 the mitral insufficiency was graded as moderate to severe although on my visual comparison it appears similar (severe) to the present study          Pt examined and discussed with Dr. Marin Comment Laurence Crofford  Advanced Heart Failure and Heart Transplant Fellow  Available on Voalte

## 2023-04-12 NOTE — Progress Notes
[facility] END OF SHIFT/PLAN OF CARE NURSING NOTE   Nursing Shift: day shift 1500-1930    Acute events, nursing interventions, & communication with providers: one time dose IV lasix given. Purewick and education provided.       Patient Goal(s)  Patient will Reach and maintain goal dry weight by the end of next shift.        Patient will  Verbalize readiness for discharge by discharge.   Admission Weight: Weight: 80.7 kg (178 lb)    Last 3 Weights:   Vitals:    04/11/23 2256 04/12/23 0425 04/12/23 1312   Weight: 80.7 kg (178 lb) 78.8 kg (173 lb 11.6 oz) 78.3 kg (172 lb 9.6 oz)     Weight Change: Weight trend stable    Intake/Output Summary (Last 24 hours) at 04/12/2023 1824  Last data filed at 04/12/2023 1733  Gross per 24 hour   Intake 650.65 ml   Output 550 ml   Net 100.65 ml     Last Bowel Movement Date:  (pta)    Fluid Restriction? Yes 1.5 L   Quality/Safety    Total Fall Risk Score: 15   Risk for Injury related to falls: Bones susceptible to fracture  Fall Risk Category:   History of More Than One Fall Within 6 Months Before Admission: No  Elimination, Bowel and Urine: Incontinence OR urgency OR frequency  Interventions: Use of bladder management device (e.g., female/female external urinary containment device)   Medications: On 2 or more high fall risk drugs  Interventions: Stay within arm's reach during toileting/showering (i.e., dizziness, orthostasis) , Educate patient on medication side effects, and Bed/chair alarm (i.e., change in mental status)   Patient Care Equipment: Two present  Interventions: Needs assistance with patient care equipment when ambulating and Ensure environment is free of clutter and walkways are clear from tripping hazards  Mobility: 2 - Assistance required, 2 - Unsteady gait  Interventions: Assist x1 and Utilize walker, cane, or additional walking aid for ambulation  Cognition: 0 - No cognition issues  Interventions: N/A - Does not score as risk in this category    Other safety precautions in place: CLABSI Prevention Bundle    Restraints:  No      Patient Education  This RN provided education to Patient today. The following education topics were reviewed:  Quality/Safety Education:   CLABSI prevention and Fall risk  Medication Education:   Medication management (Indication, adverse effects, monitoring, etc)  Education provided on the following medication(s): milrinone, zofran  Cardiac - Specific Education:   Heart failure management and Cardiac diet/nutrition  General Education:   Bowel/Urinary elimination, Diet/nutrition, Mobility/Activity intolerance, and Nausea/vomiting    The following teaching method(s) were used: Verbal  Response to learning: Verbalizes Understanding  Needs reinforcement on: n/a

## 2023-04-12 NOTE — ED Notes
Pt has been deemed as a high fall risk. Pt educated upon the health system's fall risk policy. This RN initiated high fall risk bundle for pt.

## 2023-04-12 NOTE — Progress Notes
CARDIOPULMONARY REHABILITATION  INPATIENT ASSESSMENT    Cardiac Rehabilitation Staff: Eulis Manly Discharge Date:     Demographics  Pre-admit Dx: Other (comment) Date of Admission: 04/11/2023     Room: HC528/01 DOB:  1945-06-13   Insurance: Primary: UHC Medicare Replacement Secondary: None   Address: [address] [place] [address]   Patient Phone:  [phone] (home)        ED Contact: Caiya Million (son)  ED Phone #: [phone]   CTS: N/A Cardiologist: Hosseinidehkordi, Seyedhamed     Cardiac Procedures and Events     EF: 30 %          Risk Factors  Risk Factors: Obesity, Hypertension, Hyperlipidemia, Diabetes-type II  BP: 97/50  Height: 157.5 cm (5' 2)  Weight: 78.8 kg (173 lb 11.6 oz)  BMI (Calculated): 31.77      Medical History   has a past medical history of Anxiety, Arthritis, Asthma, Asymptomatic stenosis of right carotid artery, Back pain, Cancer of uterus (HCC), COPD (chronic obstructive pulmonary disease) (HCC), Coronary artery disease, Diabetes mellitus (HCC), Dyslipidemia, Flank mass, Fluid retention, GI bleed (03/24/2023), Heart disease, Hypertension, Other malignant neoplasm without specification of site, Sleep apnea, and Vision decreased.    Labs  Cholesterol   Date Value Ref Range Status   09/16/2022 107 <200 MG/DL Final     Triglycerides   Date Value Ref Range Status   09/16/2022 72 <150 MG/DL Final     HDL   Date Value Ref Range Status   09/16/2022 50 >40 MG/DL Final     LDL   Date Value Ref Range Status   09/16/2022 45 <100 mg/dL Final     Hemoglobin Z3Y   Date Value Ref Range Status   09/16/2022 8.0 (H) 4.0 - 5.7 % Final     Comment:     The ADA recommends that most patients with type 1 and type 2 diabetes maintain   an A1c level <7%.           Heart Resource Manual Given: 04/12/23     Teaching Completed: 04/12/23     Outpatient Cardiopulmonary Rehabilitation    Outpatient Cardic Rehab: No    Referral Faxed to:       Date Faxed:      Location:      If [place], Sent to Staff: Why?: No Transportation    Eulis Manly, [place]  04/12/2023

## 2023-04-12 NOTE — Telephone Encounter
Patient Discharge Date from hospital: 04/09/23  Date Call Attempted:   Number of Attempts:   Date Call Completed:        No call made to patient as she is currently re-admitted through ED.    Two Patient Identifier complete: Yes []     Next Appointment    Next follow-up appointment on 04/13/2023 at 1100 with Ivory Broad, APRN at our [facility].     Transportation    Does pt have transportation?  Yes []     No []    NA []      Home Health    04/09/23 [facility] OFFICE Ph) [phone] Fax) [phone]   [facility] Ph) [phone] Fax) [phone]     Medications    Does pt have all medications? Yes  []     No []       No changes made to medications    Diet    Regular diet ordered on IP AVS  Pt was previously on 200 mg cholesterol, 2 G Na, 2 L fluid restriction diet.    Is patient following prescribed diet and restrictions?  Yes []    No []      Scale/Weight    Does pt have a scale at home?  Yes []    No []     Did pt weight first thing this morning?  Yes []    No []      If yes, what was pt's first morning weight today?      Signs and Symptoms    Pt reports the following symptoms:           Pt verbalized understanding of signs and symptoms of HF and when to contact a provider or seek immediate assistance at the ER.    Was pt given zone sheet? Yes []   No [x]     Intervention(s)          Plan of Care    Continued education needed for heart failure symptom management and when to contact our office.

## 2023-04-12 NOTE — ED Notes
Pt is a 77 y/o F who arrived to ED42 with CC of shortness of breath and vomiting. Pt endorses gradual and worsening shortness of breath. Pt reports experiencing shortness of breath at rest, with exertion and while laying down. Pt reports PMH of CHF. Pt reports shortness of breath has gradually worsened since discharge from [facility] ~ one week ago. Pt endorses nausea, chills, fatigue and decreased urine output. Pt reports last BM was today. Pt reports usage of walker for ambulation at baseline. Upon assessment, pt has 1+ pitting edema in LLE. Pt presents with subclavian CVC infusion Milrinone. Pt denies any other medical concerns at this time. Family at bedside.     Pt is A&Ox4. VSS. Skin warm, dry and race appropriate. Pt placed on monitors. Pt resting on cart, wheels locked in lowest locked position. Side rails raised x2. Call light and belongings within reach. Awaiting provider evaluation. Pt assumes care of all belongings.     Past Medical History:    Anxiety    Arthritis    Asthma    Asymptomatic stenosis of right carotid artery    Back pain    Cancer of uterus (HCC)    COPD (chronic obstructive pulmonary disease) (HCC)    Coronary artery disease    Diabetes mellitus (HCC)    Dyslipidemia    Flank mass    Fluid retention    Heart disease    Hypertension    Other malignant neoplasm without specification of site    Sleep apnea    Vision decreased

## 2023-04-12 NOTE — Case Management (ED)
Case Management Admission Assessment    NAME:Raven [name]                          MRN: [id number]             DOB:[birth year]          AGE: 77 y.o.  ADMISSION DATE: 04/11/2023             DAYS ADMITTED: LOS: 1 day      Today?s Date: 04/12/2023    Source of Information: pt at bedside       Plan  Plan: Case Management Assessment, Assist PRN with SW/NCM Services  This CM met with pt for assessment on this date.  Provided contact information and explanation of SW/NCM roles. Provided opportunity for questions and discussion. Pt/family encouraged to contact Case Management team with questions and concerns during hospitalization and until patient is able to transition back to the patient's primary care physician.    Pt on cont. Milrinone. NCM following this.    [name] son will provide transport.    CM to follow for dc needs.    Patient Address/Phone  [address]  [place] [address]  [phone] (home)     Emergency Contact  Extended Emergency Contact Information  Primary Emergency Contact: Ware,CLIFTON  Home Phone: [phone]  Mobile Phone: [phone]  Relation: Son  Secondary Emergency Contact: [facility]  Home Phone: [phone]  Mobile Phone: [phone]  Relation: Relative  Interpreter needed? No    Forensic scientist: Yes, patient has a healthcare directive  Type of Healthcare Directive: Durable power of attorney for healthcare  Location of Healthcare Directive: Patient does not have it with him/her  Would patient like to fill out a (a new) Healthcare Directive?: No, patient declined      Transportation  Does the Patient Need Case Management to Arrange Discharge Transport? (ex: facility, ambulance, wheelchair/stretcher, Medicaid, cab, other): No  Will the Patient Use Family Transport?: Yes  Transportation Name, Phone and Availability #1: son    Expected Discharge Date  04/12/2023     Living Situation Prior to Admission  Living Arrangements  Type of Residence: Home, independent  Living Arrangements: Family members  Financial risk analyst / Tub: Tub/Shower Unit  How many levels in the residence?: 2  Can patient live on one level if needed?: Yes  Does residence have entry and/or inside stairs?: Yes (2 STE)  Assistance needed prior to admit or anticipated on discharge: No  Who provides assistance or could if needed?: son  Are they in good health?: Unknown  Can support system provide 24/7 care if needed?: Maybe  Level of Function   Prior level of function: Independent  Cognitive Abilities   Cognitive Abilities: Engages in problem solving and planning, Participates in decision making, Alert and Control and instrumentation engineer Resources  Coverage  Primary Insurance: Medicare Replacement  Secondary Insurance: Commercial insurance  Medication Coverage    Are current medications affordable?: No  Are medications being skipped or not taken as prescribed?: No  Do You Manage Your Own Medications?: Yes  Source of Income   Source Of Income: SSI  Financial Assistance Needed?  N/A    Psychosocial Needs  Mental Health  Mental Health History: No  Substance Use History  Substance Use History Screen: No  Other  N/A    Current/Previous Services  PCP  Barbaraann Rondo, [phone], [phone]  Pharmacy    [facility] -  [place] - [address]  [address]  [place] [address]  Phone: [phone] Fax: [phone]    Durable Medical Equipment   Durable Medical Equipment at home: CPAP/BiPAP, Art gallery manager, Paediatric nurse, Environmental consultant, Grab bars, Other (comment) (chair lift)  Home Health  Receiving home health: Yes  Agency name: [facility]  Would patient use this agency again?: Yes  Hemodialysis or Peritoneal Dialysis  Undergoing hemodialysis or peritoneal dialysis: No  Tube/Enteral Feeds  Receive tube/enteral feeds: No  Infusion  Receive infusions: Yes  Duration: continuous  Medication Received: Milrinone  Private Duty  Private duty help used: No  Home and Community Based Services  Home and community based services: No  Ryan White  Ryan White: N/A  Hospice  Hospice: No  Outpatient Therapy  PT: No  OT: No  SLP: No  Skilled Nursing Facility/Nursing Home  SNF: No  NH: No  Inpatient Rehab  IPR: No  Long-Term Acute Care Hospital  LTACH: No  Acute Hospital Stay  Acute Hospital Stay: Yes  Was patient's stay within the last 30 days?: Yes  Name of Hospital: [facility]  When did patient receive care?: 12/6-12/13  Readmission Code Group: 9. Unrelated Readmision      Kelli Churn, LMSW  Social Work Case Production designer, theatre/television/film  Available on [employer] phone: [phone]

## 2023-04-12 NOTE — Progress Notes
Pharmacy High Risk Medication Review    A high risk medication review has been performed for Mardene Celeste by a pharmacy team member.      Medication class(es) to review on inpatient med list:  None Identified      The medications class(es) are noted to be high risk to mobility or mentation in patients >77 years of age. If your patient has been CAM positive (diagnosed with delirium) at any point during this hospitalization then it would be particularly helpful to review these medications. Each medication has its own unique risks and benefits to the patient, we ask that these medications are reviewed and considered for de-prescribing where appropriate.     Consider reviewing [email or web address] for anticholinergic burden calculation.  If you need further assistance identifying or de-prescribing, please contact pharmacy.      Current Inpatient Medications:  Scheduled Meds:amiodarone (CORDARONE) tablet 200 mg, 200 mg, Oral, QDAY  apixaban (ELIQUIS) tablet 5 mg, 5 mg, Oral, BID  budesonide-formoterol HFA (SYMBICORT) 160-4.5 mcg/actuation inhalation 2 puff, 2 puff, Inhalation, BID  [Held by Provider] carvediloL (COREG) tablet 3.125 mg, 3.125 mg, Oral, BID w/meals  furosemide (LASIX) injection 40 mg, 40 mg, Intravenous, QDAY  furosemide (LASIX) injection 40 mg, 40 mg, Intravenous, ONCE  insulin aspart (U-100) (NOVOLOG FLEXPEN U-100 INSULIN) injection PEN 0-6 Units, 0-6 Units, Subcutaneous, 5 X Daily  pantoprazole DR (PROTONIX) tablet 40 mg, 40 mg, Oral, BID(11-21)  rosuvastatin (CRESTOR) tablet 10 mg, 10 mg, Oral, QDAY  sertraline (ZOLOFT) tablet 50 mg, 50 mg, Oral, QDAY    Continuous Infusions:   milrinone (PRIMACOR) 20 mg/D5W 100 mL infusion 0.3 mcg/kg/min (04/12/23 0519)     PRN and Respiratory Meds:acetaminophen Q4H PRN, albuterol sulfate Q4H PRN, alteplase PRN (On Call from Rx), dextrose 50% (D50) IV PRN, melatonin QHS PRN        Thank you,  Daun Peacock, [facility]  04/12/2023

## 2023-04-12 NOTE — ED Provider Notes
Raven [name] is a 77 y.o. female.    Chief Complaint:  Chief Complaint   Patient presents with    Shortness of Breath     HX CHF. ENDORSING SHORTNESS OF BREATH WHEN LAYING FLAT. ON MILRINONE    Vomiting     STARTED TODAY. MULTIPLE EPISODES. ALSO ENDORSING NAUSEA, CHILLS, CONSTIPATION, DECREASED UOP, FATIGUE       History of Present Illness:    Patient states there are lots of reason that she is here she has shortness of breath she got water on her her body hurts she cannot walk 10 feet she is feeling worse no fevers no chills she is on a milrinone drip drip decreased urine output she is authoritatively on retaining more water worsened shortness of breath lying down she feels constipated vomiting recently took a polyps of her colon after colonoscopy she denies any chest pain       -- Bortezomib -- RASH    --  Noted in Provider note 8/22 cancer treatment drug   -- Allopurinol -- HIVES   -- Sulfa (Sulfonamide Antibiotics) -- NAUSEA ONLY     has a past medical history of Anxiety, Arthritis, Asthma, Asymptomatic stenosis of right carotid artery, Back pain, Cancer of uterus (HCC), COPD (chronic obstructive pulmonary disease) (HCC), Coronary artery disease, Diabetes mellitus (HCC), Dyslipidemia, Flank mass, Fluid retention, Heart disease, Hypertension, Other malignant neoplasm without specification of site, Sleep apnea, and Vision decreased.     has a past surgical history that includes heart catheterization; colonoscopy; Cardiac surgery; coronary stent placement; carotid endardectomy (Right); hysterectomy; bone marrow biopsy; Cardiac defibrillator placement; Tunneled venous port placement (Right); colectomy (N/A, 08/10/2022); Upper gastrointestinal endoscopy (N/A, 09/17/2022); Upper gastrointestinal endoscopy (N/A, 03/29/2023); Upper gastrointestinal endoscopy (N/A, 03/29/2023); and Upper gastrointestinal endoscopy (N/A, 04/04/2023).     reports that she quit smoking about 14 years ago. Her smoking use included cigarettes. She started smoking about 34 years ago. She has a 20 pack-year smoking history. She has never been exposed to tobacco smoke. She has never used smokeless tobacco. She reports that she does not currently use alcohol. She reports that she does not currently use drugs.        Shortness of Breath  Presenting symptoms: no congestion, no cough, no ear pain, no fever and no sore throat    Associated symptoms: no headaches, no myalgias and no neck pain    Risk factors: being elderly and chronic cardiac disease    Vomiting  Associated symptoms: no cough, no fever, no headaches, no myalgias and no sore throat        Review of Systems:  Review of Systems   Constitutional: Negative.  Negative for activity change, diaphoresis and fever.   HENT: Negative.  Negative for congestion, ear pain and sore throat.    Eyes: Negative.  Negative for pain and visual disturbance.   Respiratory:  Positive for shortness of breath. Negative for cough.    Cardiovascular: Negative.  Negative for chest pain.   Gastrointestinal:  Positive for vomiting.   Endocrine: Negative.    Genitourinary: Negative.  Negative for dysuria and flank pain.   Musculoskeletal: Negative.  Negative for back pain, myalgias and neck pain.   Skin: Negative.  Negative for rash and wound.   Neurological: Negative.  Negative for dizziness, weakness, light-headedness, numbness and headaches.   Hematological: Negative.  Does not bruise/bleed easily.   All other systems reviewed and are negative.      Allergies:  Bortezomib, Allopurinol, and Sulfa (  sulfonamide antibiotics)    Past Medical History:  Past Medical History:    Anxiety    Arthritis    Asthma    Asymptomatic stenosis of right carotid artery    Back pain    Cancer of uterus (HCC)    COPD (chronic obstructive pulmonary disease) (HCC)    Coronary artery disease    Diabetes mellitus (HCC)    Dyslipidemia    Flank mass    Fluid retention    GI bleed    Heart disease    Hypertension    Other malignant neoplasm without specification of site    Sleep apnea    Vision decreased       Past Surgical History:  Surgical History:   Procedure Laterality Date    ROBOT ASSISTED LOW ANTERIOR RESECTION N/A 08/10/2022    Performed by Benetta Spar, MD at [facility] OR    ESOPHAGOGASTRODUODENOSCOPY WITH BIOPSY - FLEXIBLE N/A 09/17/2022    Performed by Tempie Hoist, DO at [facility] ENDO    CATHETERIZATION RIGHT HEART N/A 09/18/2022    Performed by Cath, Physician at [facility] CATH LAB    REMOVAL AND REPLACEMENT IMPLANTABLE DEFIBRILLATOR GENERATOR - SINGLE LEAD SYSTEM Left 11/04/2022    Performed by Kathreen Cornfield, MD at [facility] EP LAB    INSERTION/ REPLACEMENT PERMANENT PACEMAKER WITH ATRIAL LEAD Left 11/04/2022    Performed by Kathreen Cornfield, MD at [facility] EP LAB    Injection Venography Extremity Left 11/04/2022    Performed by Kathreen Cornfield, MD at [facility] EP LAB    ESOPHAGOGASTRODUODENOSCOPY WITH BIOPSY - FLEXIBLE N/A 03/29/2023    Performed by Buckles, Vinnie Level, MD at [facility] ENDO    ESOPHAGOGASTRODUODENOSCOPY WITH SNARE REMOVAL TUMOR/ POLYP/ OTHER LESION - FLEXIBLE N/A 03/29/2023    Performed by Buckles, Vinnie Level, MD at [facility] ENDO    ESOPHAGOGASTRODUODENOSCOPY WITH CONTROL OF BLEEDING - FLEXIBLE N/A 04/04/2023    Performed by Buckles, Vinnie Level, MD at [facility] ENDO    BONE MARROW BIOPSY      CARDIAC DEFIBRILLATOR PLACEMENT      [employer]    CARDIAC SURGERY      CABG 3 V    CAROTID ENDARDECTOMY Right     COLONOSCOPY      HX CORONARY STENT PLACEMENT      HX HEART CATHETERIZATION      HX HYSTERECTOMY      TUNNELED VENOUS PORT PLACEMENT Right     Chest       Pertinent medical/surgical history reviewed    Social History:  Social History     Tobacco Use    Smoking status: Former     Current packs/day: 0.00     Average packs/day: 1 pack/day for 20.0 years (20.0 ttl pk-yrs)     Types: Cigarettes     Start date: 23     Quit date: 2010     Years since quitting: 14.9     Passive exposure: Never    Smokeless tobacco: Never   Vaping Use    Vaping status: Never Used   Substance Use Topics    Alcohol use: Not Currently    Drug use: Not Currently     Social History     Substance and Sexual Activity   Drug Use Not Currently             Family History:  Family History   Problem Relation Name Age of Onset    Cancer Mother  Diabetes Mother      Hypertension Mother      Cancer-Breast Sister      Cancer-Ovarian Sister      Cancer Sister      Diabetes Sister      Heart Disease Sister      Hypertension Sister      Heart Disease Brother      Hypertension Brother      Stroke Brother         Vitals:  ED Vitals      Date and Time T BP P RR SPO2P SPO2 User   04/12/23 0240 -- 100/55 78 27 PER MINUTE -- -- TC   04/12/23 0000 -- 101/58 77 18 PER MINUTE -- 93 % TC   04/11/23 2303 -- 92/59 -- 23 PER MINUTE -- -- TC   04/11/23 2256 36.2 ?C (97.2 ?F) -- 72 -- -- 94 % JC            Physical Exam:  Physical Exam  Vitals and nursing note reviewed.   Constitutional:       General: She is not in acute distress.     Appearance: She is well-developed. She is not diaphoretic.      Comments: Mildly ill appearing     HENT:      Head: Normocephalic and atraumatic.   Eyes:      Conjunctiva/sclera: Conjunctivae normal.      Pupils: Pupils are equal, round, and reactive to light.   Neck:      Trachea: No tracheal deviation.      Comments: Jvd positive  Cardiovascular:      Rate and Rhythm: Normal rate and regular rhythm.      Heart sounds: No murmur heard.     No friction rub. No gallop.   Pulmonary:      Effort: Pulmonary effort is normal. No respiratory distress.      Breath sounds: Normal breath sounds. No wheezing or rales.   Abdominal:      General: There is no distension.      Palpations: Abdomen is soft.      Tenderness: There is no abdominal tenderness.   Musculoskeletal:         General: Swelling (b.l lower ext) present. Normal range of motion.      Cervical back: Normal range of motion.      Right lower leg: Edema present.      Left lower leg: Edema present.   Skin:     General: Skin is warm and dry. Neurological:      Mental Status: She is alert and oriented to person, place, and time.         Laboratory Results:  Labs Reviewed   CBC - Abnormal       Result Value Ref Range Status    White Blood Cells 11.20 (*) 4.50 - 11.00 10*3/uL Final    Red Blood Cells 2.77 (*) 4.00 - 5.00 10*6/uL Final    Hemoglobin 8.4 (*) 12.0 - 15.0 g/dL Final    Hematocrit 84.6 (*) 36.0 - 45.0 % Final    MCV 92.0  80.0 - 100.0 fL Final    MCH 30.5  26.0 - 34.0 pg Final    MCHC 33.1  32.0 - 36.0 g/dL Final    RDW 96.2 (*) 95.2 - 15.0 % Final    Platelet Count 318  150 - 400 10*3/uL Final    MPV 8.6  7.0 - 11.0 fL Final  COMPREHENSIVE METABOLIC PANEL - Abnormal    Sodium 142  137 - 147 mmol/L Final    Potassium 4.8  3.5 - 5.1 mmol/L Final    Chloride 113 (*) 98 - 110 mmol/L Final    Glucose 113 (*) 70 - 100 mg/dL Final    Blood Urea Nitrogen 36 (*) 7 - 25 mg/dL Final    Creatinine 3.08 (*) 0.40 - 1.00 mg/dL Final    Calcium 8.5  8.5 - 10.6 mg/dL Final    Total Protein 5.4 (*) 6.0 - 8.0 g/dL Final    Total Bilirubin 0.8  0.2 - 1.3 mg/dL Final    Albumin 3.4 (*) 3.5 - 5.0 g/dL Final    Alk Phosphatase 100  25 - 110 U/L Final    AST 35  7 - 40 U/L Final    ALT 17  7 - 56 U/L Final    CO2 17 (*) 21 - 30 mmol/L Final    Anion Gap 11  3 - 12 Final    Glomerular Filtration Rate (GFR) 17 (*) >60 mL/min Final   PROTIME INR (PT) - Abnormal    Protime 31.9 (*) 10.2 - 12.9 Seconds Final    INR 3.0 (*) 0.9 - 1.2 Final   NT-PRO-BNP - Abnormal    NT-Pro-BNP 12,291 (*) <450 pg/mL Final   HIGH SENSITIVITY TROPONIN I 0 HOUR - Abnormal    hs Troponin I 0 Hour 34 (*) <12 ng/L Final   HIGH SENSITIVITY TROPONIN I 2 HOUR - Abnormal    hs Troponin I 2 Hour 35 (*) <12 ng/L Final    hs Troponin I 2-0hr Delta Value 1   Final   POC GLUCOSE - Abnormal    Glucose, POC 127 (*) 70 - 100 mg/dL Final   COVID INFLUENZA A/B AND RSV PCR - Normal    Influenza A Virus Not Detected  Not Detected, Test Invalid Final    Influenza B Virus Not Detected  Not Detected, Test Invalid Final    RSV PCR Not Detected  Not Detected, Test Invalid Final    COVID-19 (SARS-CoV-2) PCR Not Detected  Not Detected Final   LIPASE - Normal    Lipase 12  11 - 82 U/L Final   TSH WITH FREE T4 REFLEX - Normal    TSH 2.62  0.35 - 5.00 ?IU/mL Final   PTT (APTT) - Normal    APTT 35.0  24.0 - 36.5 Seconds Final   POC LACTATE - Normal    LACTIC ACID POC 1.4  0.5 - 2.0 mmol/L Final     POC Glucose (Download): (!) 139    Radiology Interpretation:  2D + DOPPLER ECHO   Final Result      CHEST SINGLE VIEW   Final Result         Findings of CHF, with moderate cardiomegaly, pulmonary edema, and small pleural effusions.          Finalized by Maida Sale, MD on 04/12/2023 12:02 AM. Dictated by Maida Sale, MD on 04/12/2023 12:00 AM.          EKG:  ECG Results              [facility] MAIN ECG TRIAGE ONLY (Final result)        Collection Time Result Time VT RATE P-R Interval QRS DURATION Q-T Interval QTC Calc Bazett    04/11/23 23:05:19 04/12/23 07:59:31 76 226 112 356 400  Collection Time Result Time P Axis R Axis T Axis    04/11/23 23:05:19 04/12/23 07:59:31 79 -27 -68                     Final result                   Impression:    Sinus rhythm with 1st degree AV block  Cannot rule out Anterior infarct , age undetermined  Abnormal ECG  When compared with ECG of 15-Feb-2023 10:09,  QRS duration has decreased  Non-specific change in ST segment in Anterior leads  Nonspecific T wave abnormality now evident in Inferior leads  Nonspecific T wave abnormality now evident in Anterior leads  QT has shortened  Confirmed by Ceule, Scott ([id number]) on 04/12/2023 7:59:27 AM                                    Medical Decision Making:  Jaquelina Acorn is a 77 y.o. female who presents with chief complaint as listed above. Based on the history and presentation, the list of differential diagnoses considered included, but was not limited to, chf exac, acs, mi, pna, abd concerns obstruction, sbo, uti stercoral colitis,     ED Course  77 y.o.female presents to the ED for Shortness of Breath (HX CHF. ENDORSING SHORTNESS OF BREATH WHEN LAYING FLAT. ON MILRINONE) and Vomiting (STARTED TODAY. MULTIPLE EPISODES. ALSO ENDORSING NAUSEA, CHILLS, CONSTIPATION, DECREASED UOP, FATIGUE)      - Patient was evaluated by Lillia Corporal MD  -Pertinent available records reviewed.    - Vitals were reviewed Blood pressure 118/59, pulse 80, temperature 36.2 ?C (97.2 ?F), height 157.5 cm (5' 2), weight 77.6 kg (171 lb), SpO2 96%.          - Meds given   Medications   melatonin tablet 5 mg (5 mg Oral Given 04/13/23 2330)   acetaminophen (TYLENOL) tablet 650 mg (has no administration in time range)   amiodarone (CORDARONE) tablet 200 mg (200 mg Oral Given 04/14/23 1037)   apixaban (ELIQUIS) tablet 5 mg (5 mg Oral Med Not Given 04/14/23 2106)   budesonide-formoterol HFA (SYMBICORT) 160-4.5 mcg/actuation inhalation 2 puff (2 puffs Inhalation Given 04/14/23 2002)   carvediloL (COREG) tablet 3.125 mg ( Oral Held by Provider 04/26/23 1700)   milrinone (PRIMACOR) 20 mg/D5W 100 mL infusion (0.3 mcg/kg/min ? 78.3 kg (Dosing Weight) Intravenous Given - New Bag 04/15/23 0114)   rosuvastatin (CRESTOR) tablet 10 mg (10 mg Oral Given 04/14/23 1038)   sertraline (ZOLOFT) tablet 50 mg (50 mg Oral Given 04/14/23 1038)   dextrose 50% (D50) syringe 25-50 mL (has no administration in time range)   insulin aspart (U-100) (NOVOLOG FLEXPEN U-100 INSULIN) injection PEN 0-6 Units ( Subcutaneous Med Not Given 04/15/23 0350)   alteplase (CATHFLO ACTIVASE) injection 2 mg (has no administration in time range)   albuterol sulfate (PROAIR HFA) inhaler 2 puff (has no administration in time range)   pantoprazole DR (PROTONIX) tablet 40 mg (40 mg Oral Given 04/14/23 2041)   bumetanide (BUMEX) injection 2 mg ( Intravenous Held by Provider 04/27/23 2100)   senna (SENOKOT) tablet 1 tablet (1 tablet Oral Med Not Given 04/14/23 2041)   docusate (COLACE) capsule 100 mg (100 mg Oral Med Not Given 04/14/23 2041)   ondansetron HCL (PF) (ZOFRAN (PF)) injection 4 mg (4 mg Intravenous Given 04/14/23 0857)   prochlorperazine  edisylate (COMPAZINE) injection 10 mg (has no administration in time range)   MELATONIN 5 MG PO TAB (Cabinet Override) (  Med Not Given 04/14/23 2035)   potassium chloride SR (K-DUR) tablet 40 mEq (has no administration in time range)   ondansetron HCL (PF) (ZOFRAN (PF)) injection 4 mg (4 mg Intravenous Given 04/11/23 2351)   ondansetron HCL (PF) (ZOFRAN (PF)) injection 4 mg (4 mg Intravenous Given 04/12/23 0035)   furosemide (LASIX) injection 40 mg (40 mg Intravenous Given 04/12/23 0237)   ondansetron HCL (PF) (ZOFRAN (PF)) injection 4 mg (4 mg Intravenous Given 04/12/23 0610)   perflutren lipid microspheres (DEFINITY) injection 1-10 Diluted mL (2 Diluted mL Intravenous Given 04/12/23 0832)     And   sodium chloride PF 0.9% injection 10 mL (10 mL Intravenous Given 04/12/23 0831)   furosemide (LASIX) injection 40 mg (40 mg Intravenous Given 04/12/23 1608)   albumin 25% injection 12.5 g (12.5 g Intravenous Given - New Bag 04/13/23 0953)   potassium chloride SR (K-DUR) tablet 40 mEq (40 mEq Oral Given 04/14/23 0550)     - Labs significant for   Abnormal Labs Reviewed   CBC - Abnormal; Notable for the following components:       Result Value    White Blood Cells 11.20 (*)     Red Blood Cells 2.77 (*)     Hemoglobin 8.4 (*)     Hematocrit 25.5 (*)     RDW 21.3 (*)     All other components within normal limits   COMPREHENSIVE METABOLIC PANEL - Abnormal; Notable for the following components:    Chloride 113 (*)     Glucose 113 (*)     Blood Urea Nitrogen 36 (*)     Creatinine 2.79 (*)     Total Protein 5.4 (*)     Albumin 3.4 (*)     CO2 17 (*)     Glomerular Filtration Rate (GFR) 17 (*)     All other components within normal limits   PROTIME INR (PT) - Abnormal; Notable for the following components:    Protime 31.9 (*)     INR 3.0 (*)     All other components within normal limits NT-PRO-BNP - Abnormal; Notable for the following components:    NT-Pro-BNP 12,291 (*)     All other components within normal limits   URINALYSIS DIPSTICK REFLEX TO CULTURE - Abnormal; Notable for the following components:    Glucose,UA 3+ (*)     All other components within normal limits   URINALYSIS MICROSCOPIC REFLEX TO CULTURE - Abnormal; Notable for the following components:    Hyaline Cast 5 - 10 (*)     All other components within normal limits   HIGH SENSITIVITY TROPONIN I 0 HOUR - Abnormal; Notable for the following components:    hs Troponin I 0 Hour 34 (*)     All other components within normal limits   HIGH SENSITIVITY TROPONIN I 2 HOUR - Abnormal; Notable for the following components:    hs Troponin I 2 Hour 35 (*)     All other components within normal limits   HIGH SENSITIVITY TROPONIN I 4 HR - Abnormal; Notable for the following components:    hs Troponin I 4 Hour 28 (*)     All other components within normal limits   CBC - Abnormal; Notable for the following components:    Red Blood Cells 2.64 (*)     Hemoglobin 7.9 (*)  Hematocrit 24.6 (*)     RDW 21.5 (*)     All other components within normal limits   COMPREHENSIVE METABOLIC PANEL - Abnormal; Notable for the following components:    Chloride 112 (*)     Glucose 119 (*)     Blood Urea Nitrogen 38 (*)     Creatinine 2.76 (*)     Calcium 8.3 (*)     Total Protein 5.0 (*)     Albumin 3.2 (*)     CO2 18 (*)     Glomerular Filtration Rate (GFR) 17 (*)     All other components within normal limits   PHOSPHORUS - Abnormal; Notable for the following components:    Phosphorus 5.4 (*)     All other components within normal limits   HIGH SENSITIVITY TROPONIN I, RANDOM - Abnormal; Notable for the following components:    hs Troponin I, Random 28 (*)     All other components within normal limits   NT-PRO-BNP - Abnormal; Notable for the following components:    NT-Pro-BNP 11,324 (*)     All other components within normal limits   CBC - Abnormal; Notable for the following components:    Red Blood Cells 2.41 (*)     Hemoglobin 7.0 (*)     Hematocrit 21.8 (*)     RDW 22.0 (*)     All other components within normal limits   COMPREHENSIVE METABOLIC PANEL - Abnormal; Notable for the following components:    Glucose 167 (*)     Blood Urea Nitrogen 41 (*)     Creatinine 3.23 (*)     Calcium 8.1 (*)     Total Protein 4.7 (*)     Albumin 3.0 (*)     AST 48 (*)     CO2 20 (*)     Glomerular Filtration Rate (GFR) 14 (*)     All other components within normal limits   PHOSPHORUS - Abnormal; Notable for the following components:    Phosphorus 5.1 (*)     All other components within normal limits   CBC - Abnormal; Notable for the following components:    Red Blood Cells 2.36 (*)     Hemoglobin 6.9 (*)     Hematocrit 21.3 (*)     RDW 21.8 (*)     All other components within normal limits   CBC - Abnormal; Notable for the following components:    Red Blood Cells 2.67 (*)     Hemoglobin 8.2 (*)     Hematocrit 23.9 (*)     RDW 19.8 (*)     All other components within normal limits   COMPREHENSIVE METABOLIC PANEL - Abnormal; Notable for the following components:    Potassium 3.3 (*)     Glucose 150 (*)     Blood Urea Nitrogen 40 (*)     Creatinine 3.19 (*)     Calcium 8.1 (*)     Total Protein 5.1 (*)     Albumin 3.3 (*)     AST 41 (*)     ALT 58 (*)     CO2 19 (*)     Anion Gap 13 (*)     Glomerular Filtration Rate (GFR) 14 (*)     All other components within normal limits   COMPREHENSIVE METABOLIC PANEL - Abnormal; Notable for the following components:    Chloride 112 (*)     Glucose 148 (*)     Blood  Urea Nitrogen 37 (*)     Creatinine 2.87 (*)     Calcium 8.3 (*)     Total Protein 5.1 (*)     Albumin 3.3 (*)     CO2 19 (*)     Anion Gap 13 (*)     Glomerular Filtration Rate (GFR) 16 (*)     All other components within normal limits   POC GLUCOSE - Abnormal; Notable for the following components:    Glucose, POC 127 (*)     All other components within normal limits   POC GLUCOSE - Abnormal; Notable for the following components:    Glucose, POC 199 (*)     All other components within normal limits   POC GLUCOSE - Abnormal; Notable for the following components:    Glucose, POC 228 (*)     All other components within normal limits   POC GLUCOSE - Abnormal; Notable for the following components:    Glucose, POC 225 (*)     All other components within normal limits   POC GLUCOSE - Abnormal; Notable for the following components:    Glucose, POC 134 (*)     All other components within normal limits   POC GLUCOSE - Abnormal; Notable for the following components:    Glucose, POC 121 (*)     All other components within normal limits   POC GLUCOSE - Abnormal; Notable for the following components:    Glucose, POC 172 (*)     All other components within normal limits   POC GLUCOSE - Abnormal; Notable for the following components:    Glucose, POC 206 (*)     All other components within normal limits   POC GLUCOSE - Abnormal; Notable for the following components:    Glucose, POC 216 (*)     All other components within normal limits   POC GLUCOSE - Abnormal; Notable for the following components:    Glucose, POC 155 (*)     All other components within normal limits   POC GLUCOSE - Abnormal; Notable for the following components:    Glucose, POC 188 (*)     All other components within normal limits   POC GLUCOSE - Abnormal; Notable for the following components:    Glucose, POC 179 (*)     All other components within normal limits   POC GLUCOSE - Abnormal; Notable for the following components:    Glucose, POC 145 (*)     All other components within normal limits   POC GLUCOSE - Abnormal; Notable for the following components:    Glucose, POC 146 (*)     All other components within normal limits   POC GLUCOSE - Abnormal; Notable for the following components:    Glucose, POC 148 (*)     All other components within normal limits   POC GLUCOSE - Abnormal; Notable for the following components:    Glucose, POC 154 (*) All other components within normal limits   POC GLUCOSE - Abnormal; Notable for the following components:    Glucose, POC 139 (*)     All other components within normal limits   POC GLUCOSE - Abnormal; Notable for the following components:    Glucose, POC 178 (*)     All other components within normal limits     - Imaging significant for     2D + DOPPLER ECHO   Final Result      CHEST SINGLE VIEW   Final Result  Findings of CHF, with moderate cardiomegaly, pulmonary edema, and small pleural effusions.          Finalized by Maida Sale, MD on 04/12/2023 12:02 AM. Dictated by Maida Sale, MD on 04/12/2023 12:00 AM.          -Patient was re-evaluated on multiple occasions during the ER visit.  Patient's symptoms have improved with the ER treatments/interventions.  Patient is appropriate for further management as an Inpatient.     -Discussed the results of the current evaluation with the patient, answered all questions, and reviewed the plan of care moving forward.  The patient was in agreement with the plan of care.  Patient's condition was stabilized and appropriate for Inpatient management.     Complexity of Problems Addressed  Patient's active diagnoses as well as contributing pre-existing medical problems include:  Clinical Impression   Dyspnea, unspecified type   Acute on chronic congestive heart failure, unspecified heart failure type (HCC)        Evaluation performed for potential threat to life or bodily function during this visit given the initial differential diagnosis and clinical impression(s) as discussed previously in MDM/ED course.    Medical Decision Making  Amount and/or Complexity of Data Reviewed  Independent Historian: caregiver  External Data Reviewed: notes.  Labs: ordered.  Radiology: ordered.  ECG/medicine tests: ordered and independent interpretation performed.    Risk  Prescription drug management.  Drug therapy requiring intensive monitoring for toxicity.  Decision regarding hospitalization.  Diagnosis or treatment significantly limited by social determinants of health.               ED Scoring:                  e              Facility Administered Meds:  Medications   melatonin tablet 5 mg (has no administration in time range)   acetaminophen (TYLENOL) tablet 650 mg (has no administration in time range)   amiodarone (CORDARONE) tablet 200 mg (has no administration in time range)   apixaban (ELIQUIS) tablet 5 mg (has no administration in time range)   budesonide-formoterol HFA (SYMBICORT) 160-4.5 mcg/actuation inhalation 2 puff (has no administration in time range)   carvediloL (COREG) tablet 3.125 mg (has no administration in time range)   milrinone (PRIMACOR) 20 mg/D5W 100 mL infusion (has no administration in time range)   rosuvastatin (CRESTOR) tablet 10 mg (has no administration in time range)   sertraline (ZOLOFT) tablet 50 mg (has no administration in time range)   dextrose 50% (D50) syringe 25-50 mL (has no administration in time range)   insulin aspart (U-100) (NOVOLOG FLEXPEN U-100 INSULIN) injection PEN 0-6 Units (has no administration in time range)   alteplase (CATHFLO ACTIVASE) injection 2 mg (has no administration in time range)   ondansetron HCL (PF) (ZOFRAN (PF)) injection 4 mg (4 mg Intravenous Given 04/11/23 2351)   ondansetron HCL (PF) (ZOFRAN (PF)) injection 4 mg (4 mg Intravenous Given 04/12/23 0035)   furosemide (LASIX) injection 40 mg (40 mg Intravenous Given 04/12/23 0237)       Clinical Impression:  Clinical Impression   Dyspnea, unspecified type   Acute on chronic congestive heart failure, unspecified heart failure type (HCC)       Disposition/Follow up  ED Disposition     ED Disposition   Admit           [facility]  [address]  [address] [place] [address]  [phone]          Medications:  Current Discharge Medication List          Procedure Notes:  Procedures       Attestation / Supervision:  I personally performed the E/M including history, physical exam, and MDM.      Bethann Humble, MD

## 2023-04-12 NOTE — Progress Notes
HC5 END OF SHIFT/PLAN OF CARE NURSING NOTE   Nursing Shift: Night Shift 1900-0700    Acute events, nursing interventions, & communication with providers: pt c/o nausea upon arrival to unit. Notified Dr. Stanford Breed. One time dose of IV zofran ordered and admin per eMAR. Pt on milrinone gtt.      Patient Goal(s)  Patient will Reach and maintain goal dry weight by the end of next shift.        Patient will  Verbalize readiness for discharge by discharge.   Admission Weight: Weight: 80.7 kg (178 lb)    Last 3 Weights:   Vitals:    04/11/23 2256 04/12/23 0425   Weight: 80.7 kg (178 lb) 78.8 kg (173 lb 11.6 oz)     Weight Change: Weight trend stable  No intake or output data in the 24 hours ending 04/12/23 1610  Last Bowel Movement Date:  (PTA)    Fluid Restriction? Yes 1.5 L   Quality/Safety    Total Fall Risk Score: 15   Risk for Injury related to falls: Bones susceptible to fracture  Fall Risk Category:   History of More Than One Fall Within 6 Months Before Admission: No  Elimination, Bowel and Urine: Incontinence OR urgency OR frequency  Interventions: Use of bladder management device (e.g., female/female external urinary containment device)   Medications: On 2 or more high fall risk drugs  Interventions: Stay within arm's reach during toileting/showering (i.e., dizziness, orthostasis) , Educate patient on medication side effects, and Bed/chair alarm (i.e., change in mental status)   Patient Care Equipment: Two present  Interventions: Needs assistance with patient care equipment when ambulating and Ensure environment is free of clutter and walkways are clear from tripping hazards  Mobility: 2 - Assistance required, 2 - Unsteady gait  Interventions: Assist x1 and Utilize walker, cane, or additional walking aid for ambulation  Cognition: 0 - No cognition issues  Interventions: N/A - Does not score as risk in this category    Other safety precautions in place: CLABSI Prevention Bundle    Restraints:  No      Patient Education  This RN provided education to Patient today. The following education topics were reviewed:  Quality/Safety Education:   CLABSI prevention and Fall risk  Medication Education:   Medication management (Indication, adverse effects, monitoring, etc)  Education provided on the following medication(s): milrinone, zofran  Cardiac - Specific Education:   Heart failure management and Cardiac diet/nutrition  General Education:   Bowel/Urinary elimination, Diet/nutrition, Mobility/Activity intolerance, and Nausea/vomiting    The following teaching method(s) were used: Verbal  Response to learning: Verbalizes Understanding  Needs reinforcement on: n/a

## 2023-04-13 ENCOUNTER — Encounter: Admit: 2023-04-13 | Discharge: 2023-04-13 | Payer: MEDICARE

## 2023-04-13 LAB — POC GLUCOSE
~~LOC~~ BKR POC GLUCOSE: 199 mg/dL — ABNORMAL HIGH (ref 70–100)
~~LOC~~ BKR POC GLUCOSE: 155 mg/dL — ABNORMAL HIGH (ref 70–100)
~~LOC~~ BKR POC GLUCOSE: 188 mg/dL — ABNORMAL HIGH (ref 70–100)
~~LOC~~ BKR POC GLUCOSE: 206 mg/dL — ABNORMAL HIGH (ref 70–100)
~~LOC~~ BKR POC GLUCOSE: 216 mg/dL — ABNORMAL HIGH (ref 70–100)

## 2023-04-13 LAB — CBC
~~LOC~~ BKR HEMATOCRIT: 21 % — ABNORMAL LOW (ref 36.0–45.0)
~~LOC~~ BKR HEMOGLOBIN: 6.9 g/dL — ABNORMAL LOW (ref 12.0–15.0)
~~LOC~~ BKR MCH: 29 pg (ref 26.0–34.0)
~~LOC~~ BKR MCHC: 32 g/dL (ref 32.0–36.0)
~~LOC~~ BKR MCV: 90 fL (ref 80.0–100.0)
~~LOC~~ BKR MPV: 8.4 fL (ref 7.0–11.0)
~~LOC~~ BKR PLATELET COUNT: 276 10*3/uL (ref 150–400)
~~LOC~~ BKR RBC COUNT: 2.3 10*6/uL — ABNORMAL LOW (ref 4.00–5.00)
~~LOC~~ BKR RDW: 21 % — ABNORMAL HIGH (ref 11.0–15.0)
~~LOC~~ BKR WBC COUNT: 7.8 10*3/uL (ref 4.50–11.00)

## 2023-04-13 MED ORDER — ALBUMIN, HUMAN 25 % IV SOLP
12.5 g | Freq: Once | INTRAVENOUS | 0 refills | Status: CP
Start: 2023-04-13 — End: ?
  Administered 2023-04-13: 16:00:00 12.5 g via INTRAVENOUS

## 2023-04-13 MED ORDER — BUMETANIDE 0.25 MG/ML IJ SOLN
2 mg | Freq: Two times a day (BID) | INTRAVENOUS | 0 refills | Status: DC
Start: 2023-04-13 — End: 2023-04-15
  Administered 2023-04-13 – 2023-04-14 (×2): 2 mg via INTRAVENOUS

## 2023-04-13 MED ORDER — SENNOSIDES 8.6 MG PO TAB
1 | Freq: Two times a day (BID) | ORAL | 0 refills | Status: DC
Start: 2023-04-13 — End: 2023-04-16
  Administered 2023-04-14 (×2): 1 via ORAL

## 2023-04-13 MED ORDER — DOCUSATE SODIUM 100 MG PO CAP
100 mg | Freq: Two times a day (BID) | ORAL | 0 refills | Status: DC
Start: 2023-04-13 — End: 2023-04-16
  Administered 2023-04-14 (×2): 100 mg via ORAL

## 2023-04-13 NOTE — Progress Notes
Heart Failure Progress Note    NAME:Raven [name]             MRN: [id number]                 DOB:[birth year]          AGE: 77 y.o.  ADMISSION DATE: 04/11/2023             DAYS ADMITTED: LOS: 1 day      Principal Problem:    Acute on chronic HFrEF (heart failure with reduced ejection fraction) (HCC)          Reason for Consultation:  Evaluation and recommendations re: Heart failure and management of diuretics        History of Present Illness: Raven [name] is a 77 y.o. 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 She was recently admitted and discharged from 04/02/2023 till 04/09/23 for GI bleed and required 2 units of pRBC. Her home torsemide was held due to bump in her creatinine with plans to follow up with Cardiology outpatient.    After the discharge, patient on 12/14 started to have SOB with minimum exertion and eventually during the rest and she reports fluid buildup mainly on her legs and abdomen and she had abdominal bloating/she was not able even to drink water. She also endorsees orthopnea. Furthermore, patient million pump was found to be off since 04/11/2023 noon when her son helped her and changing it afterward she slept and woke up and never checked the pump again till she presents the hospital she reported she may was sleeping and did not hear the alarm.  On admission, patient was given lasix 40 mg IV once.      Recommendations:  Diuretics: Increase his diuretics from Lasix 40 mg twice daily IV to Bumex 2 mg twice daily IV  Will give 1 dose of albumin 30 minutes prior to her Bumex  GDMT: Continue to hold home coreg , currently other GDMT is not an option due to her kidney function.   Continue home milrinone at 0.3  Will keep her NPO after MN for possible RHC if Cr continued to worse    Standing daily weight as well as strict I&Os       Ongoing:  BMP once a day. Magnesium level daily.  Keep Potassium greater than 4.0 and Magnesium greater than 2.0.  2000mg  sodium dietary restriction.  Fluid Restriction:1.5  Strict I/O. Goal output: net neg 2L/24 hour  Daily standing scale weight. Goal Dry Weight: TBD    Please place a specimen in lab NT-proBNP order on day of discharge in effort to establish baseline value correlating with suspected euvolemia.      Primary team responsible for placing Post-Discharge Health System Appointment Request order set for Cardiology: Heart Failure appointment request within 24-48 hours of discharge. (Please do not place order any earlier in effort to reduce possible need for cancellation and rescheduling of visit).    Subjective:    She reports feeling good and back to her baseline. However, she is not making good urine output despite diuresing her.     Objective:  Vital Signs:  Last Filed                Vital Signs: 24 Hour Range   BP: 105/49 (12/17 0840)  Temp: 36.8 ?C (98.3 ?F) (12/17 0840)  Pulse: 77 (12/17 0840)  Respirations: 17 PER MINUTE (12/17 0840)  SpO2: 94 % (12/17 0840)  O2%: 21 % (12/16 2315)  O2 Device: None (Room air) (12/17 0840)  BP: (92-110)/(43-64)   Temp:  [36.3 ?C (97.3 ?F)-36.9 ?C (98.5 ?F)]   Pulse:  [70-81]   Respirations:  [16 PER MINUTE-20 PER MINUTE]   SpO2:  [93 %-99 %]   O2%:  [21 %]   O2 Device: None (Room air)           Wt Readings from Last 10 Encounters:   04/13/23 78.3 kg (172 lb 9.6 oz)   04/06/23 75.9 kg (167 lb 5.3 oz)   03/26/23 74.2 kg (163 lb 9.3 oz)   03/19/23 77 kg (169 lb 12.8 oz)   02/16/23 75.3 kg (166 lb)   02/15/23 75.3 kg (166 lb)   01/04/23 75.2 kg (165 lb 12.8 oz)   12/14/22 74.6 kg (164 lb 6.4 oz)   11/30/22 79.2 kg (174 lb 9.6 oz)   11/17/22 78.4 kg (172 lb 12.8 oz)       Physical Exam:    General Appearance: no distress, overweight  Neck Veins: JVP is elevated but patient also has severe TR, HJR positive  Auscultation: breathing comfortably, lungs clear to auscultation, no rales or rhonchi, no wheezing  Cardiac Auscultation: Regular rhythm, S1, S2, no S3 or S4, pan systolic murmur  Lower Extremity Edema: 2+ bilateral LL edema    Abdominal Exam: soft, non-tender, bowel sounds normal  Orientation: clear historian, good insight      Assessment:   Acute Exacerbation systolic and diastolic HFrEF,  EF: 30%.   Major Complications or Comorbidities [facility]): acute respiratory failure and acute/ acute on chronic systolic and/or diastolic heart failure  NYHA functional class IV (unable to carry on any physical activity without symptoms of HF, or symptoms of HF at rest),   ACC Stage D (refractory HF requiring specialized interventions).   She presents with signs of hypervolemia with Left  ventricular failure with signs of low flow state.    BNP Labs:   Lab Results   Component Value Date    NTPROBNP 11,324 (H) 04/12/2023    NTPROBNP 12,291 (H) 04/11/2023    NTPROBNP 3,269 (H) 02/17/2023       High Sensitivity Troponin:  Lab Results   Component Value Date    HSTROP0HR 34 (H) 04/11/2023    HSTROP2HR 35 (H) 04/12/2023    HSTROP4HR 28 (H) 04/12/2023    HSTROPDELT4 -7 04/12/2023    HSTROPDELTA 1 04/12/2023    HIGHSTROPI 28 (H) 04/12/2023         Admission Weight: 80.7 kg (178 lb)        Most recent weights (inpatient):   Vitals:    04/12/23 0425 04/12/23 1312 04/13/23 0500   Weight: 78.8 kg (173 lb 11.6 oz) 78.3 kg (172 lb 9.6 oz) 78.3 kg (172 lb 9.6 oz)        I/O:       Intake/Output Summary (Last 24 hours) at 04/13/2023 0917  Last data filed at 04/13/2023 0849  Gross per 24 hour   Intake 1247.63 ml   Output 505 ml   Net 742.63 ml     Net IO Since Admission: 417.63 mL [  04/13/23 0917]      PLAN:  Diuretic Therapy Prior to admission dose    Torsemide 20 mg daily was held prior to last discharge   Given on admission    Lasix 40 mg IV once    Daily Dosing   Lasix 40 mg IV daily on 12/16  Bumex 2 mg twice daily IV on 12/17/       Guideline Directed Medical Therapy PTA Inpatient Changes   ACEI/ARB No  Renal dysfunction    ARNI No Renal dysfunction    BB Yes, Coreg 3.125 mg BID  Held on 12/26   SGLT-2 Inhibitor Hx of UTI and low GFR     Mineralocorticoid Receptor Antagonist No Renal dysfunction    Hydralazine/Nitrate No Hypotension    Ivabradine No; Hypotension    Heart Rhythm Management Therapy Yes (CRT-D)    Anticoagulation for Afib/flutter apixaban (Eliquis) 5 mg BID    Cardiac Rehab Evaluation for LVEF < or equal to 40% Yes; Date ordered: 04/12/2023    7-Day post hospital follow up scheduled within 24-48 hours of discharge No Discharge not expected within the next 24-48 hours        Most Recent Cardiac Testing/Procedures        #CAD S/P PCI and CABG   - trop 34-35-28     # AKI on CKD  with baseline 2.0 - 2.2   - Could be cardiorenal   - Will see if respond to lasix     #AFIB  - continue eliquis 5 mg BID   - on home amiodarone 200 mg daily   - continue tele     #CRTD in situ    Past Medical History:    Anxiety    Arthritis    Asthma    Asymptomatic stenosis of right carotid artery    Back pain    Cancer of uterus (HCC)    COPD (chronic obstructive pulmonary disease) (HCC)    Coronary artery disease    Diabetes mellitus (HCC)    Dyslipidemia    Flank mass    Fluid retention    GI bleed    Heart disease    Hypertension    Other malignant neoplasm without specification of site    Sleep apnea    Vision decreased     Surgical History:   Procedure Laterality Date    ROBOT ASSISTED LOW ANTERIOR RESECTION N/A 08/10/2022    Performed by Benetta Spar, MD at [facility] OR    ESOPHAGOGASTRODUODENOSCOPY WITH BIOPSY - FLEXIBLE N/A 09/17/2022    Performed by Tempie Hoist, DO at [facility] ENDO    CATHETERIZATION RIGHT HEART N/A 09/18/2022 Performed by Cath, Physician at [facility] CATH LAB    REMOVAL AND REPLACEMENT IMPLANTABLE DEFIBRILLATOR GENERATOR - SINGLE LEAD SYSTEM Left 11/04/2022    Performed by Kathreen Cornfield, MD at [facility] EP LAB    INSERTION/ REPLACEMENT PERMANENT PACEMAKER WITH ATRIAL LEAD Left 11/04/2022    Performed by Kathreen Cornfield, MD at [facility] EP LAB    Injection Venography Extremity Left 11/04/2022    Performed by Kathreen Cornfield, MD at [facility] EP LAB    ESOPHAGOGASTRODUODENOSCOPY WITH BIOPSY - FLEXIBLE N/A 03/29/2023    Performed by Buckles, Vinnie Level, MD at [facility] ENDO    ESOPHAGOGASTRODUODENOSCOPY WITH SNARE REMOVAL TUMOR/ POLYP/ OTHER LESION - FLEXIBLE N/A 03/29/2023    Performed by Buckles, Vinnie Level, MD at [facility] ENDO    ESOPHAGOGASTRODUODENOSCOPY WITH CONTROL OF BLEEDING - FLEXIBLE N/A 04/04/2023  Performed by Buckles, Vinnie Level, MD at [facility] ENDO    BONE MARROW BIOPSY      CARDIAC DEFIBRILLATOR PLACEMENT      [facility] [facility]    CARDIAC SURGERY      CABG 3 V    CAROTID ENDARDECTOMY Right     COLONOSCOPY      HX CORONARY STENT PLACEMENT      HX HEART CATHETERIZATION      HX HYSTERECTOMY      TUNNELED VENOUS PORT PLACEMENT Right     Chest     Family History   Problem Relation Name Age of Onset    Cancer Mother      Diabetes Mother      Hypertension Mother      Cancer-Breast Sister      Cancer-Ovarian Sister      Cancer Sister      Diabetes Sister      Heart Disease Sister      Hypertension Sister      Heart Disease Brother      Hypertension Brother      Stroke Brother       Social History     Socioeconomic History    Marital status: Widowed   Tobacco Use    Smoking status: Former     Current packs/day: 0.00     Average packs/day: 1 pack/day for 20.0 years (20.0 ttl pk-yrs)     Types: Cigarettes     Start date: 64     Quit date: 2010     Years since quitting: 14.9     Passive exposure: Never    Smokeless tobacco: Never   Vaping Use    Vaping status: Never Used   Substance and Sexual Activity    Alcohol use: Not Currently    Drug use: Not Currently Allergies:   Allergies   Allergen Reactions    Bortezomib RASH     Noted in Provider note 8/22 cancer treatment drug    Allopurinol HIVES    Sulfa (Sulfonamide Antibiotics) NAUSEA ONLY        Medications:  Scheduled Meds:amiodarone (CORDARONE) tablet 200 mg, 200 mg, Oral, QDAY  apixaban (ELIQUIS) tablet 5 mg, 5 mg, Oral, BID  budesonide-formoterol HFA (SYMBICORT) 160-4.5 mcg/actuation inhalation 2 puff, 2 puff, Inhalation, BID  [Held by Provider] carvediloL (COREG) tablet 3.125 mg, 3.125 mg, Oral, BID w/meals  insulin aspart (U-100) (NOVOLOG FLEXPEN U-100 INSULIN) injection PEN 0-6 Units, 0-6 Units, Subcutaneous, 5 X Daily  pantoprazole DR (PROTONIX) tablet 40 mg, 40 mg, Oral, BID(11-21)  rosuvastatin (CRESTOR) tablet 10 mg, 10 mg, Oral, QDAY  sertraline (ZOLOFT) tablet 50 mg, 50 mg, Oral, QDAY    Continuous Infusions:   milrinone (PRIMACOR) 20 mg/D5W 100 mL infusion 0.3 mcg/kg/min (04/13/23 0630)     PRN and Respiratory Meds:acetaminophen Q4H PRN, albuterol sulfate Q4H PRN, alteplase PRN (On Call from Rx), dextrose 50% (D50) IV PRN, melatonin QHS PRN    Medications Prior to Admission   Medication Sig Dispense Refill Last Dose/Taking    acetaminophen (TYLENOL) 325 mg tablet Take three tablets by mouth every 8 hours as needed for Pain. Indications: pain 40 tablet 0     acyclovir (ZOVIRAX) 400 mg tablet Take one tablet by mouth twice daily.       albuterol sulfate (PROAIR HFA) 90 mcg/actuation HFA aerosol inhaler Inhale two puffs by mouth into the lungs every 6 hours as needed for Wheezing or Shortness of Breath.       amiodarone (  CORDARONE) 200 mg tablet Take one tablet by mouth daily. Indications: prevention of recurrent atrial fibrillation 90 tablet 1     apixaban (ELIQUIS) 5 mg tablet Take one tablet by mouth twice daily. Indications: afib 60 tablet 0     budesonide-formoterol HFA (SYMBICORT) 160-4.5 mcg/actuation aerosol inhaler Inhale two puffs by mouth into the lungs twice daily. Indications: bronchospasm prevention with COPD 30.6 g 0     carvediloL (COREG) 3.125 mg tablet Take one tablet by mouth twice daily with meals. Take with food.  Indications: heart failute with rEF 180 tablet 0     cholecalciferol (VITAMIN D) 1,000 units tablet Take one tablet by mouth daily.       cyanocobalamin (VITAMIN B-12) 1,000 mcg tablet Take 5,000 Units by mouth daily.       dexAMETHasone (DECADRON) 4 mg tablet Take two tablets by mouth every 7 days. Patient is taking 8mg  on Thursdays prior to chemo       empagliflozin ([name]) 25 mg tablet Take one tablet by mouth daily.       EMU OIL (BULK) MISC Use  as directed. Apply topically as needed       febuxostat (ULORIC) 40 mg tablet Take one-half tablet by mouth daily. Indications: taken on chemo days 45 tablet 0     ferrous sulfate (FEOSOL) 325 mg (65 mg iron) tablet Take one tablet by mouth daily. Take on an empty stomach at least 1 hour before or 2 hours after food.       insulin detemir U-100 (LEVEMIR FLEXTOUCH) 100 unit/mL (3 mL) injection pen Resume after speaking with your PCP or endocrinology  Indications: type 2 diabetes mellitus       milrinone lactate/D5W (MILRINONE IN D5W IV) Administer  through vein. Milrinone 80mg /234ml  0.71mcg/kg/min intravenously continuous infusion. Dosing wt 78.3 kg       nitroglycerin (NITROSTAT) 0.4 mg tablet Place one tablet under tongue every 5 minutes as needed for Chest Pain.       pantoprazole DR (PROTONIX) 40 mg tablet Take one tablet by mouth twice daily. Indications: gastritis 112 tablet 0     potassium chloride SR (K-DUR) 20 mEq tablet Resume after cardiology follow up  Indications: prevention of low potassium in the blood       rosuvastatin (CRESTOR) 20 mg tablet Take one-half tablet by mouth daily. Indications: excessive fat in the blood 90 tablet 0     sertraline (ZOLOFT) 50 mg tablet Take one tablet by mouth daily.       torsemide (DEMADEX) 20 mg tablet Resume after cardiology follow up  Indications: accumulation of fluid resulting from chronic heart failure           Laboratory Review:   CBC w/Diff    Lab Results   Component Value Date/Time    WBC 8.30 04/13/2023 03:27 AM    RBC 2.41 (L) 04/13/2023 03:27 AM    HGB 7.0 (L) 04/13/2023 03:27 AM    HCT 21.8 (L) 04/13/2023 03:27 AM    MCV 90.2 04/13/2023 03:27 AM    MCH 28.9 04/13/2023 03:27 AM    MCHC 32.1 04/13/2023 03:27 AM    RDW 22.0 (H) 04/13/2023 03:27 AM    PLTCT 274 04/13/2023 03:27 AM    MPV 8.3 04/13/2023 03:27 AM    Lab Results   Component Value Date/Time    NEUT 62 03/31/2023 03:42 AM    ANC 7.10 (H) 03/31/2023 03:42 AM    LYMA 15 (L) 03/31/2023 03:42 AM  ALC 1.80 03/31/2023 03:42 AM    MONA 13 (H) 03/31/2023 03:42 AM    AMC 1.50 (H) 03/31/2023 03:42 AM    EOSA 8 (H) 03/31/2023 03:42 AM    AEC 0.90 (H) 03/31/2023 03:42 AM    BASA 1 03/31/2023 03:42 AM    ABC 0.10 03/31/2023 03:42 AM         Chemistry    Lab Results   Component Value Date/Time    NA 141 04/13/2023 03:27 AM    K 3.7 04/13/2023 03:27 AM    CL 110 04/13/2023 03:27 AM    CO2 20 (L) 04/13/2023 03:27 AM    GAP 11 04/13/2023 03:27 AM    BUN 41 (H) 04/13/2023 03:27 AM    CR 3.23 (H) 04/13/2023 03:27 AM    GLU 167 (H) 04/13/2023 03:27 AM    MG 2.0 04/13/2023 03:27 AM    Lab Results   Component Value Date/Time    CA 8.1 (L) 04/13/2023 03:27 AM    PO4 5.1 (H) 04/13/2023 03:27 AM    ALBUMIN 3.0 (L) 04/13/2023 03:27 AM    TOTPROT 4.7 (L) 04/13/2023 03:27 AM    ALKPHOS 90 04/13/2023 03:27 AM    AST 48 (H) 04/13/2023 03:27 AM    ALT 48 04/13/2023 03:27 AM    TOTBILI 0.7 04/13/2023 03:27 AM    GFR 14 (L) 04/13/2023 03:27 AM          Lipid Profile INR   Lab Results   Component Value Date    CHOL 107 09/16/2022    TRIG 72 09/16/2022    HDL 50 09/16/2022    LDL 45 09/16/2022    VLDL 14 09/16/2022    NONHDLCHOL 57 09/16/2022         Lab Results   Component Value Date    INR 3.0 (H) 04/11/2023          Chest X-Ray:  04/11/2023 CHF, with moderate cardiomegaly, pulmonary edema, and small   pleural effusions.      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 with PR of 226     Echocardiogram Details on 04/12/23 :     Eccentric hypertrophy of the left ventricle with severe dilatation of the left ventricular chamber  Left ventricular systolic function is moderately to severely impaired with an estimate ejection fraction of 30% and global hypokinesis  Right ventricle is moderately dilated with normal function  The left atrium is severely dilated  Pacemaker lead seen in the right atrium and right ventricle  Severe mitral valve insufficiency  Severe tricuspid valve insufficiency  No pericardial effusion  Severe pulmonary hypertension with estimated peak systolic pulmonary artery pressure of 73 mmHg     In comparison to previous echocardiogram from May 2024 the mitral insufficiency was graded as moderate to severe although on my visual comparison it appears similar (severe) to the present study          Pt examined and discussed with Dr. Marin Comment Dafina Suk  Advanced Heart Failure and Heart Transplant Fellow   Available on Voalte

## 2023-04-13 NOTE — Progress Notes
HC5 END OF SHIFT/PLAN OF CARE NURSING NOTE   Nursing Shift: Night Shift 1900-0700    Acute events, nursing interventions, & communication with providers: no acute events.      Patient Goal(s)  Patient will Reach and maintain goal dry weight by the end of next shift.        Patient will  Verbalize readiness for discharge by discharge.   Admission Weight: Weight: 80.7 kg (178 lb)    Last 3 Weights:   Vitals:    04/12/23 0425 04/12/23 1312 04/13/23 0500   Weight: 78.8 kg (173 lb 11.6 oz) 78.3 kg (172 lb 9.6 oz) 78.3 kg (172 lb 9.6 oz)     Weight Change: Weight trend stable    Intake/Output Summary (Last 24 hours) at 04/13/2023 0641  Last data filed at 04/13/2023 9528  Gross per 24 hour   Intake 1247.63 ml   Output 830 ml   Net 417.63 ml     Last Bowel Movement Date:  (pta)    Fluid Restriction? Yes 1.5 L   Quality/Safety    Total Fall Risk Score: 15   Risk for Injury related to falls: Bones susceptible to fracture  Fall Risk Category:   History of More Than One Fall Within 6 Months Before Admission: No  Elimination, Bowel and Urine: Incontinence OR urgency OR frequency  Interventions: Use of bladder management device (e.g., female/female external urinary containment device)   Medications: On 2 or more high fall risk drugs  Interventions: Stay within arm's reach during toileting/showering (i.e., dizziness, orthostasis) , Educate patient on medication side effects, and Bed/chair alarm (i.e., change in mental status)   Patient Care Equipment: Two present  Interventions: Needs assistance with patient care equipment when ambulating and Ensure environment is free of clutter and walkways are clear from tripping hazards  Mobility: 2 - Assistance required, 2 - Unsteady gait  Interventions: Assist x1 and Utilize walker, cane, or additional walking aid for ambulation  Cognition: 0 - No cognition issues  Interventions: N/A - Does not score as risk in this category    Other safety precautions in place: CLABSI Prevention Bundle    Restraints:  No      Patient Education  This RN provided education to Patient today. The following education topics were reviewed:  Quality/Safety Education:   CLABSI prevention and Fall risk  Medication Education:   Medication management (Indication, adverse effects, monitoring, etc)  Education provided on the following medication(s): all meds given this shift  Cardiac - Specific Education:   Heart failure management and Cardiac diet/nutrition  General Education:   Bowel/Urinary elimination, Diet/nutrition, Mobility/Activity intolerance, and Nausea/vomiting    The following teaching method(s) were used: Verbal  Response to learning: Verbalizes Understanding  Needs reinforcement on: n/a

## 2023-04-13 NOTE — Progress Notes
General Progress Note    Name: Raven [name]        MRN: [id number]          DOB: [birth year]            Age: 77 y.o.  Admission Date: 04/11/2023       LOS: 1 day    Date of Service: 04/13/2023    Raven [name] is a 77 year old woman admitted with HFrEF exacerbation. 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   -in the ED: dyspneic, normotensive, not hypoxic. Restless when trying to lay flat.   -NT-pro-BNP 12,291, creatinine 2.79, bicarb 17, AG 11, lactate 1.4, lipase 12, INR 3.0, WBC 11.2, Hb 8.4, platelet 318.  -troponin 34 -> 28  -influenza/covid/RSV swab negative   -04/11/23 CXR: bilateral pleural effusions   -last echo May 2024: LVEF 30%, moderate-severe MR, severe TR  -received IV lasix 40mg  x1 in the ED  -when verifying milrinone dosing, pharmacist noted that pump has been off since ~ noon of 04/11/23. Port has not been flushing. Will try to troubleshoot with alteplase and resume milrinone via PIV if not able to use port-a-cath    Intake/Output Summary (Last 24 hours) at 04/13/2023 1507  Last data filed at 04/13/2023 1206  Gross per 24 hour   Intake 827.63 ml   Output 280 ml   Net 547.63 ml     Vitals:    04/12/23 0425 04/12/23 1312 04/13/23 0500   Weight: 78.8 kg (173 lb 11.6 oz) 78.3 kg (172 lb 9.6 oz) 78.3 kg (172 lb 9.6 oz)     Plan  -Tele monitoring   --strict I's and O's, fluid restricion  -replete lytes PRN. Goal K 4.0 and Mag 2.0  -PTA milrinone infusion. Continue current dose   -Consulted HF Cardiology  Increase IV diuretics to Bumex 2 mg IV BID, 2g of IV albumin   NPO @ [facility] for possible RHC 12/18 if continued sub-optimal UO and renal dysfunction   Hold PTA Coreg for now with marginal BPs     AKI on CKD stage 4  Chronic metabolic acidosis  -baseline creatinine ~ 2.0-2.2  -AKI likely secondary to cardiorenal syndrome, renal function worsening 12/17      COPD  Subcentimeter nodular opacities in lungs  -CT A/P 12/6: Tree-in-bud and multiple subcentimeter nodular opacities are   indeterminate.   -Follow-up CT chest in 6-8 weeks recommended for reevaluation.   -PTA symbicort. Continue      Type 2 diabetes  -PTA jardiance and levemir. Holding  -LDCF      Leukocytosis, chronic   Chronic anemia  Multiple Myeloma  -follows [facility], on outpatient daratumumab and darbepoetin  -PTA acyclovir prophylaxis. Continue      Diverticulosis  H/O melena and hematemesis  -12/2 EGD: 1.5 cm friable polyp in duodenum s/p clip x2, no bleeding seen   -12/8 EGD with ulcer in duodenum and visible vessel, no active  bleeding.  Epinephrine injection utilized at pace with thermal therapy and mechanical therapy of Endo Clip placed.  -Received 2 units pRBC 12/9  -Hgb 7.9 on admission, was 7.2 at discharge on 12/13, confirmed she was only taking Eliquis at discharge and did not resume ASA and Plavix, as instructed   PLAN  -AM Hgb 7.0, stools recorded as brown, repeat 6.9   Transfuse 1u pRBC     Consults: HF Cardiology, NPO @ [facility] for possible RHC on 12/18  Nutrition: low sodium diet  VTE PPx: PTA apixaban   Lines/Drains/tubes: PIV, port-a-cath  Code Status: Full Code  Assessment & Plan  Acute on chronic HFrEF (heart failure with reduced ejection fraction) (HCC)      Present on Admission:   Acute on chronic HFrEF (heart failure with reduced ejection fraction) (HCC)                                     Ellwood Sayers, MD  Hospitalist  Internal Medicine    Voalte is the preferred method of communication.   Please use the Med Private First Call for all patient-related communications. Personal Voaltes and pagers are not answered at all hours.    High medical decision making due to the following:  1 or more chronic illness with severe exacerbation and 1 acute or chronic illness that poses a threat to life or bodily function  Review of notes outside of my specialty, Review of each unique test, and Ordering of each unique test  drug therapy requiring intensive monitoring for toxicity IV diuretics, milrinone   _______________________________________________________________________    Subjective   Raven [name] is a 77 y.o. female.  NAEON. Raven [name] is doing well today. She feels much better overall. She took a shower today. She reports minimal dyspnea with exertion, no chest pain or lightheadedness. She is having BMs which are not black or bloody. No other new symptoms.          Medications  Scheduled Meds:amiodarone (CORDARONE) tablet 200 mg, 200 mg, Oral, QDAY  apixaban (ELIQUIS) tablet 5 mg, 5 mg, Oral, BID  budesonide-formoterol HFA (SYMBICORT) 160-4.5 mcg/actuation inhalation 2 puff, 2 puff, Inhalation, BID  bumetanide (BUMEX) injection 2 mg, 2 mg, Intravenous, BID  [Held by Provider] carvediloL (COREG) tablet 3.125 mg, 3.125 mg, Oral, BID w/meals  insulin aspart (U-100) (NOVOLOG FLEXPEN U-100 INSULIN) injection PEN 0-6 Units, 0-6 Units, Subcutaneous, 5 X Daily  pantoprazole DR (PROTONIX) tablet 40 mg, 40 mg, Oral, BID(11-21)  rosuvastatin (CRESTOR) tablet 10 mg, 10 mg, Oral, QDAY  sertraline (ZOLOFT) tablet 50 mg, 50 mg, Oral, QDAY    Continuous Infusions:   milrinone (PRIMACOR) 20 mg/D5W 100 mL infusion 0.3 mcg/kg/min (04/13/23 0630)     PRN and Respiratory Meds:acetaminophen Q4H PRN, albuterol sulfate Q4H PRN, alteplase PRN (On Call from Rx), dextrose 50% (D50) IV PRN, melatonin QHS PRN                Objective                        Vital Signs: Last Filed Vital Signs: 24 Hour Range   BP: 104/47 (12/17 1200)  Temp: 36.6 ?C (97.9 ?F) (12/17 1200)  Pulse: 77 (12/17 0840)  Respirations: 18 PER MINUTE (12/17 1200)  SpO2: 95 % (12/17 1200)  O2%: 21 % (12/16 2315)  O2 Device: None (Room air) (12/17 1200)  BP: (95-110)/(43-64)   Temp:  [36.3 ?C (97.3 ?F)-36.8 ?C (98.3 ?F)]   Pulse:  [77-81]   Respirations:  [16 PER MINUTE-20 PER MINUTE]   SpO2:  [93 %-99 %]   O2%:  [21 %]   O2 Device: None (Room air)     Vitals:    04/12/23 0425 04/12/23 1312 04/13/23 0500   Weight: 78.8 kg (173 lb 11.6 oz) 78.3 kg (172 lb 9.6 oz) 78.3 kg (172 lb 9.6 oz)       Intake/Output Summary:  (Last 24 hours)    Intake/Output Summary (Last 24 hours) at 04/13/2023 1507  Last data filed at 04/13/2023 1206  Gross per 24 hour   Intake 827.63 ml   Output 280 ml   Net 547.63 ml     Stool Occurrence: 1        Physical Exam  General:  Alert, cooperative, no distress, appears stated age  Head:  Normocephalic, without obvious abnormality, atraumatic  Eyes:  CC  Lungs:  Clear to auscultation bilaterally  Heart:   RRR  Abdomen:  Soft, non-tender.  Bowel sounds normal.  No masses.  No organomegaly.  Extremities: 1+ pitting edema BLLE    Lab Review  24-hour labs:    Results for orders placed or performed during the hospital encounter of 04/11/23 (from the past 24 hours)   POC GLUCOSE    Collection Time: 04/12/23  5:15 PM   Result Value Ref Range    Glucose, POC 228 (H) 70 - 100 mg/dL   POC GLUCOSE    Collection Time: 04/12/23  9:23 PM   Result Value Ref Range    Glucose, POC 199 (H) 70 - 100 mg/dL   POC GLUCOSE    Collection Time: 04/13/23  3:16 AM   Result Value Ref Range    Glucose, POC 188 (H) 70 - 100 mg/dL   CBC    Collection Time: 04/13/23  3:27 AM   Result Value Ref Range    White Blood Cells 8.30 4.50 - 11.00 10*3/uL    Red Blood Cells 2.41 (L) 4.00 - 5.00 10*6/uL    Hemoglobin 7.0 (L) 12.0 - 15.0 g/dL    Hematocrit 16.1 (L) 36.0 - 45.0 %    MCV 90.2 80.0 - 100.0 fL    MCH 28.9 26.0 - 34.0 pg MCHC 32.1 32.0 - 36.0 g/dL    RDW 09.6 (H) 04.5 - 15.0 %    Platelet Count 274 150 - 400 10*3/uL    MPV 8.3 7.0 - 11.0 fL   COMPREHENSIVE METABOLIC PANEL    Collection Time: 04/13/23  3:27 AM   Result Value Ref Range    Sodium 141 137 - 147 mmol/L    Potassium 3.7 3.5 - 5.1 mmol/L    Chloride 110 98 - 110 mmol/L    Glucose 167 (H) 70 - 100 mg/dL    Blood Urea Nitrogen 41 (H) 7 - 25 mg/dL    Creatinine 4.09 (H) 0.40 - 1.00 mg/dL    Calcium 8.1 (L) 8.5 - 10.6 mg/dL    Total Protein 4.7 (L) 6.0 - 8.0 g/dL    Total Bilirubin 0.7 0.2 - 1.3 mg/dL    Albumin 3.0 (L) 3.5 - 5.0 g/dL    Alk Phosphatase 90 25 - 110 U/L    AST 48 (H) 7 - 40 U/L    ALT 48 7 - 56 U/L    CO2 20 (L) 21 - 30 mmol/L    Anion Gap 11  3 - 12    Glomerular Filtration Rate (GFR) 14 (L) >60 mL/min   MAGNESIUM    Collection Time: 04/13/23  3:27 AM   Result Value Ref Range    Magnesium 2.0 1.6 - 2.6 mg/dL   PHOSPHORUS    Collection Time: 04/13/23  3:27 AM   Result Value Ref Range    Phosphorus 5.1 (H) 2.0 - 4.5 mg/dL   POC GLUCOSE    Collection Time: 04/13/23  8:40 AM   Result Value Ref Range    Glucose, POC 155 (H) 70 - 100 mg/dL   CBC    Collection Time: 04/13/23 11:49 AM   Result Value Ref Range    White Blood Cells 7.80 4.50 - 11.00 10*3/uL    Red Blood Cells 2.36 (L) 4.00 - 5.00 10*6/uL    Hemoglobin 6.9 (L) 12.0 - 15.0 g/dL    Hematocrit 18.8 (L) 36.0 - 45.0 %    MCV 90.5 80.0 - 100.0 fL    MCH 29.4 26.0 - 34.0 pg    MCHC 32.4 32.0 - 36.0 g/dL    RDW 41.6 (H) 60.6 - 15.0 %    Platelet Count 276 150 - 400 10*3/uL    MPV 8.4 7.0 - 11.0 fL   POC GLUCOSE    Collection Time: 04/13/23 11:59 AM   Result Value Ref Range    Glucose, POC 216 (H) 70 - 100 mg/dL       Point of Care Testing  (Last 24 hours)  Glucose: (!) 167 (04/13/23 0327)  POC Glucose (Download): (!) 216 (04/13/23 1159)    Radiology and other Diagnostics Review:    Pertinent radiology reviewed.       Donzetta Sprung, MD   Pager [phone]

## 2023-04-14 LAB — PREPARE RBC - SUNQUEST
ANTIBODY SCREEN: POSITIVE
BLOOD EXPIRATION DATE: 202
CODING STATUS: 510
ISSUE DATE TIME: 202
PRODUCT CODE: 0
UNIT DIVISION: 0
UNITS ORDERED: 1

## 2023-04-14 LAB — TYPE & CROSSMATCH
~~LOC~~ BKR ANTIBODY SCREEN: POSITIVE
~~LOC~~ BKR UNITS ORDERED: 1

## 2023-04-14 LAB — POC GLUCOSE
~~LOC~~ BKR POC GLUCOSE: 172 mg/dL — ABNORMAL HIGH (ref 70–100)
~~LOC~~ BKR POC GLUCOSE: 145 mg/dL — ABNORMAL HIGH (ref 70–100)
~~LOC~~ BKR POC GLUCOSE: 146 mg/dL — ABNORMAL HIGH (ref 70–100)
~~LOC~~ BKR POC GLUCOSE: 148 mg/dL — ABNORMAL HIGH (ref 70–100)
~~LOC~~ BKR POC GLUCOSE: 154 mg/dL — ABNORMAL HIGH (ref 70–100)

## 2023-04-14 MED ORDER — PROCHLORPERAZINE EDISYLATE 5 MG/ML IJ SOLN
10 mg | INTRAVENOUS | 0 refills | Status: DC | PRN
Start: 2023-04-14 — End: 2023-04-16

## 2023-04-14 MED ORDER — POTASSIUM CHLORIDE 20 MEQ PO TBTQ
20 meq | Freq: Once | ORAL | 0 refills | Status: DC
Start: 2023-04-14 — End: 2023-04-14

## 2023-04-14 MED ORDER — ONDANSETRON HCL (PF) 4 MG/2 ML IJ SOLN
4 mg | INTRAVENOUS | 0 refills | Status: DC | PRN
Start: 2023-04-14 — End: 2023-04-16
  Administered 2023-04-14: 15:00:00 4 mg via INTRAVENOUS

## 2023-04-14 MED ORDER — POTASSIUM CHLORIDE 20 MEQ PO TBTQ
40 meq | Freq: Once | ORAL | 0 refills | Status: CP
Start: 2023-04-14 — End: ?
  Administered 2023-04-14: 12:00:00 40 meq via ORAL

## 2023-04-14 NOTE — Progress Notes
[facility] END OF SHIFT/PLAN OF CARE NURSING NOTE   Nursing Shift: Day Shift 0700-1900    Acute events, nursing interventions, & communication with providers: Communicated with Ellwood Sayers, MD regarding patient's nausea. Zofran ordered and administered.       Patient Goal(s)  Patient will Reach and maintain goal dry weight by the end of next shift.        Patient will  Verbalize readiness for discharge by discharge.   Admission Weight: Weight: 80.7 kg (178 lb)    Last 3 Weights:   Vitals:    04/12/23 1312 04/13/23 0500 04/14/23 0409   Weight: 78.3 kg (172 lb 9.6 oz) 78.3 kg (172 lb 9.6 oz) 78.3 kg (172 lb 9.6 oz)     Weight Change: Weight trend stable    Intake/Output Summary (Last 24 hours) at 04/14/2023 1709  Last data filed at 04/14/2023 1500  Gross per 24 hour   Intake 922.25 ml   Output 800 ml   Net 122.25 ml     Last Bowel Movement Date: 04/13/23 (per pt report)    Fluid Restriction? No   Quality/Safety    Total Fall Risk Score: 15   Risk for Injury related to falls: Bones susceptible to fracture and Coagulopathies/risk for bleed  Fall Risk Category:   History of More Than One Fall Within 6 Months Before Admission: No  Elimination, Bowel and Urine: Incontinence OR urgency OR frequency  Interventions: Bed pan available in room  Medications: On 2 or more high fall risk drugs  Interventions: Stay within arm's reach during toileting/showering (i.e., dizziness, orthostasis) , Educate patient on medication side effects, and Bed/chair alarm (i.e., change in mental status)   Patient Care Equipment: Two present  Interventions: Needs assistance with patient care equipment when ambulating, Ensure environment is free of clutter and walkways are clear from tripping hazards, and Assess need for patient equipment and remove if not in use  Mobility: 2 - Assistance required, 2 - Unsteady gait  Interventions: Assist x1 and Utilize walker, cane, or additional walking aid for ambulation  Cognition: 0 - No cognition issues  Interventions: N/A - Does not score as risk in this category    Other safety precautions in place: CLABSI Prevention Bundle    Restraints:  No      Patient Education  This RN provided education to Patient today. The following education topics were reviewed:  Quality/Safety Education:   CLABSI prevention and Fall risk  Medication Education:   Medication management (Indication, adverse effects, monitoring, etc)  Education provided on the following medication(s): zofran  Cardiac - Specific Education:   Heart failure management and Cardiac diet/nutrition  General Education:   Diet/nutrition, Glucose management, Mobility/Activity intolerance, and Nausea/vomiting    The following teaching method(s) were used: Verbal  Response to learning: [employer]

## 2023-04-14 NOTE — Progress Notes
[facility] END OF SHIFT/PLAN OF CARE NURSING NOTE   Nursing Shift: Night Shift 1900-0700    Acute events, nursing interventions, & communication with providers:     Patient NPO at midnight for potential RHC. Notified Dr. Kennyth Arnold of potassium level 3.3 with frequent PVCs. Replacements ordered and given per MAR.     Patient Goal(s)  Patient will Reach and maintain goal dry weight by the end of next shift.        Patient will  Verbalize readiness for discharge by discharge.   Admission Weight: Weight: 80.7 kg (178 lb)    Last 3 Weights:   Vitals:    04/12/23 1312 04/13/23 0500 04/14/23 0409   Weight: 78.3 kg (172 lb 9.6 oz) 78.3 kg (172 lb 9.6 oz) 78.3 kg (172 lb 9.6 oz)     Weight Change: Weight trend stable    Intake/Output Summary (Last 24 hours) at 04/14/2023 0636  Last data filed at 04/14/2023 0419  Gross per 24 hour   Intake 940.25 ml   Output 850 ml   Net 90.25 ml     Last Bowel Movement Date: 04/13/23 (per pt report)    Fluid Restriction? Yes NPO   Quality/Safety    Total Fall Risk Score: 15   Risk for Injury related to falls: Bones susceptible to fracture and Coagulopathies/risk for bleed  Fall Risk Category:   History of More Than One Fall Within 6 Months Before Admission: No  Elimination, Bowel and Urine: Incontinence OR urgency OR frequency  Interventions: Use of bladder management device (e.g., female/female external urinary containment device)   Medications: On 2 or more high fall risk drugs  Interventions: Stay within arm's reach during toileting/showering (i.e., dizziness, orthostasis) , Educate patient on medication side effects, and Bed/chair alarm (i.e., change in mental status)   Patient Care Equipment: Two present  Interventions: Needs assistance with patient care equipment when ambulating, Ensure environment is free of clutter and walkways are clear from tripping hazards, and Assess need for patient equipment and remove if not in use  Mobility: 2 - Assistance required, 2 - Unsteady gait  Interventions: Assist x1 and Utilize walker, cane, or additional walking aid for ambulation  Cognition: 0 - No cognition issues  Interventions: N/A - Does not score as risk in this category    Other safety precautions in place: CLABSI Prevention Bundle    Restraints:  No      Patient Education  This RN provided education to Patient today. The following education topics were reviewed:  Quality/Safety Education:   CLABSI prevention and Fall risk  Medication Education:   Medication management (Indication, adverse effects, monitoring, etc)  Education provided on the following medication(s): Milrinone, Melatonin, Potassium  Cardiac - Specific Education:   Heart failure management and Cardiac diet/nutrition  General Education:   Bowel/Urinary elimination, Diet/nutrition, Mobility/Activity intolerance, and Nausea/vomiting    The following teaching method(s) were used: Verbal  Response to learning: Verbalizes Understanding  Needs reinforcement on: n/a

## 2023-04-14 NOTE — Progress Notes
Heart Failure Progress Note    NAME:Raven [name]             MRN: [id number]                 DOB:1945/08/24          AGE: 77 y.o.  ADMISSION DATE: 04/11/2023             DAYS ADMITTED: LOS: 1 day      Principal Problem:    Acute on chronic HFrEF (heart failure with reduced ejection fraction) (HCC)          Reason for Consultation:  Evaluation and recommendations re: Heart failure and management of diuretics        History of Present Illness: Jessika [name] is a 77 y.o. 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 She was recently admitted and discharged from 04/02/2023 till 04/09/23 for GI bleed and required 2 units of pRBC. Her home torsemide was held due to bump in her creatinine with plans to follow up with Cardiology outpatient.    After the discharge, patient on 12/14 started to have SOB with minimum exertion and eventually during the rest and she reports fluid buildup mainly on her legs and abdomen and she had abdominal bloating/she was not able even to drink water. She also endorsees orthopnea. Furthermore, patient million pump was found to be off since 04/11/2023 noon when her son helped her and changing it afterward she slept and woke up and never checked the pump again till she presents the hospital she reported she may was sleeping and did not hear the alarm.  On admission, patient was given lasix 40 mg IV once.      Recommendations:  Diuretics: hold diuretic today 04/14/23 giving her kidney function and we will re-assess. Didn't respond well to diuretics yesterday. However, her kidney function is plauting and hopefully will start to improve   GDMT: Continue to hold home coreg , currently other GDMT is not an option due to her kidney function.   Continue home milrinone at 0.3  Will keep her NPO after MN for possible RHC if Cr continued to worse or if LFT is trending up    Standing daily weight as well as strict I&Os       Ongoing:  BMP once a day. Magnesium level daily.  Keep Potassium greater than 4.0 and Magnesium greater than 2.0.  2000mg  sodium dietary restriction.  Fluid Restriction:1.5  Strict I/O. Goal output: net neg 2L/24 hour  Daily standing scale weight. Goal Dry Weight: TBD    Please place a specimen in lab NT-proBNP order on day of discharge in effort to establish baseline value correlating with suspected euvolemia.      Primary team responsible for placing Post-Discharge Health System Appointment Request order set for Cardiology: Heart Failure appointment request within 24-48 hours of discharge. (Please do not place order any earlier in effort to reduce possible need for cancellation and rescheduling of visit).    Subjective:    She reports feeling good .  She received a unit of blood yesterday  No bleeding reported   Son was  in the room, all questions were answered. No CP or SOB. Making urine but less than the expectation    Objective:                       Vital Signs:  Last Filed                Vital Signs: 24 Hour Range   BP: 98/53 (12/18 1138)  Temp: 37.1 ?C (98.7 ?F) (12/18 1138)  Pulse: 79 (12/18 1138)  Respirations: 16 PER MINUTE (12/18 1138)  SpO2: 92 % (12/18 1138)  O2 Device: None (Room air) (12/18 1138)  BP: (98-123)/(51-71)   Temp:  [36.3 ?C (97.4 ?F)-37.1 ?C (98.7 ?F)]   Pulse:  [78-87]   Respirations:  [16 PER MINUTE-18 PER MINUTE]   SpO2:  [92 %-100 %]   O2 Device: None (Room air)           Wt Readings from Last 10 Encounters:   04/14/23 78.3 kg (172 lb 9.6 oz)   04/06/23 75.9 kg (167 lb 5.3 oz)   03/26/23 74.2 kg (163 lb 9.3 oz)   03/19/23 77 kg (169 lb 12.8 oz)   02/16/23 75.3 kg (166 lb)   02/15/23 75.3 kg (166 lb)   01/04/23 75.2 kg (165 lb 12.8 oz)   12/14/22 74.6 kg (164 lb 6.4 oz)   11/30/22 79.2 kg (174 lb 9.6 oz)   11/17/22 78.4 kg (172 lb 12.8 oz)       Physical Exam:    General Appearance: no distress, overweight  Neck Veins: JVP is elevated but patient also has severe TR, HJR positive  Auscultation: breathing comfortably, lungs clear to auscultation, no rales or rhonchi, no wheezing  Cardiac Auscultation: Regular rhythm, S1, S2, no S3 or S4, pan systolic murmur  Lower Extremity Edema: 2+ bilateral LL edema    Abdominal Exam: soft, non-tender, bowel sounds normal  Orientation: clear historian, good insight      Assessment:   Acute Exacerbation systolic and diastolic HFrEF,  EF: 30%.   Major Complications or Comorbidities [facility]): acute respiratory failure and acute/ acute on chronic systolic and/or diastolic heart failure  NYHA functional class IV (unable to carry on any physical activity without symptoms of HF, or symptoms of HF at rest),   ACC Stage D (refractory HF requiring specialized interventions).   She presents with signs of hypervolemia with Left  ventricular failure with signs of low flow state.    BNP Labs:   Lab Results   Component Value Date    NTPROBNP 11,324 (H) 04/12/2023    NTPROBNP 12,291 (H) 04/11/2023    NTPROBNP 3,269 (H) 02/17/2023       High Sensitivity Troponin:  Lab Results   Component Value Date    HSTROP0HR 34 (H) 04/11/2023    HSTROP2HR 35 (H) 04/12/2023    HSTROP4HR 28 (H) 04/12/2023    HSTROPDELT4 -7 04/12/2023    HSTROPDELTA 1 04/12/2023    HIGHSTROPI 28 (H) 04/12/2023         Admission Weight: 80.7 kg (178 lb)        Most recent weights (inpatient):   Vitals:    04/12/23 1312 04/13/23 0500 04/14/23 0409   Weight: 78.3 kg (172 lb 9.6 oz) 78.3 kg (172 lb 9.6 oz) 78.3 kg (172 lb 9.6 oz)        I/O:       Intake/Output Summary (Last 24 hours) at 04/14/2023 1242  Last data filed at  04/14/2023 1139  Gross per 24 hour   Intake 700.25 ml   Output 1075 ml   Net -374.75 ml     Net IO Since Admission: 282.88 mL [04/14/23 1242]      PLAN:  Diuretic Therapy    Prior to admission dose    Torsemide 20 mg daily was held prior to last discharge   Given on admission    Lasix 40 mg IV once    Daily Dosing   Lasix 40 mg IV daily on 12/16  Bumex 2 mg twice daily IV on 12/17  Diuretics on hold due to kidney dysfunction        Guideline Directed Medical Therapy PTA Inpatient Changes   ACEI/ARB No  Renal dysfunction    ARNI No Renal dysfunction    BB Yes, Coreg 3.125 mg BID  Held on 12/26   SGLT-2 Inhibitor Hx of UTI and low GFR     Mineralocorticoid Receptor Antagonist No Renal dysfunction    Hydralazine/Nitrate No Hypotension    Ivabradine No; Hypotension    Heart Rhythm Management Therapy Yes (CRT-D)    Anticoagulation for Afib/flutter apixaban (Eliquis) 5 mg BID    Cardiac Rehab Evaluation for LVEF < or equal to 40% Yes; Date ordered: 04/12/2023    7-Day post hospital follow up scheduled within 24-48 hours of discharge No Discharge not expected within the next 24-48 hours        Most Recent Cardiac Testing/Procedures      # Anemia of chronic illness  - received a unit of blood on 04/13/23     #CAD S/P PCI and CABG   - trop 34-35-28     # AKI on CKD  with baseline 2.0 - 2.2   - Could be cardiorenal   - Will see if respond to lasix     #AFIB  - continue eliquis 5 mg BID   - on home amiodarone 200 mg daily   - continue tele     #CRTD in situ    # hypokalemia   - replaced     Past Medical History:    Anxiety    Arthritis    Asthma    Asymptomatic stenosis of right carotid artery    Back pain    Cancer of uterus (HCC)    COPD (chronic obstructive pulmonary disease) (HCC)    Coronary artery disease    Diabetes mellitus (HCC)    Dyslipidemia    Flank mass    Fluid retention    GI bleed    Heart disease    Hypertension    Other malignant neoplasm without specification of site    Sleep apnea    Vision decreased     Surgical History:   Procedure Laterality Date    ROBOT ASSISTED LOW ANTERIOR RESECTION N/A 08/10/2022    Performed by Benetta Spar, MD at [facility] OR    ESOPHAGOGASTRODUODENOSCOPY WITH BIOPSY - FLEXIBLE N/A 09/17/2022    Performed by Tempie Hoist, DO at [facility] ENDO    CATHETERIZATION RIGHT HEART N/A 09/18/2022    Performed by Cath, Physician at [facility] CATH LAB    REMOVAL AND REPLACEMENT IMPLANTABLE DEFIBRILLATOR GENERATOR - SINGLE LEAD SYSTEM Left 11/04/2022    Performed by Kathreen Cornfield, MD at [facility] EP LAB    INSERTION/ REPLACEMENT PERMANENT PACEMAKER WITH ATRIAL LEAD Left 11/04/2022    Performed by Kathreen Cornfield, MD at [facility] EP LAB    Injection Venography Extremity Left 11/04/2022  Performed by Kathreen Cornfield, MD at [facility] EP LAB    ESOPHAGOGASTRODUODENOSCOPY WITH BIOPSY - FLEXIBLE N/A 03/29/2023    Performed by Buckles, Vinnie Level, MD at [facility] ENDO    ESOPHAGOGASTRODUODENOSCOPY WITH SNARE REMOVAL TUMOR/ POLYP/ OTHER LESION - FLEXIBLE N/A 03/29/2023    Performed by Buckles, Vinnie Level, MD at [facility] ENDO    ESOPHAGOGASTRODUODENOSCOPY WITH CONTROL OF BLEEDING - FLEXIBLE N/A 04/04/2023    Performed by Buckles, Vinnie Level, MD at [facility] ENDO    BONE MARROW BIOPSY      CARDIAC DEFIBRILLATOR PLACEMENT      [employer]    CARDIAC SURGERY      CABG 3 V    CAROTID ENDARDECTOMY Right     COLONOSCOPY      HX CORONARY STENT PLACEMENT      HX HEART CATHETERIZATION      HX HYSTERECTOMY      TUNNELED VENOUS PORT PLACEMENT Right     Chest     Family History   Problem Relation Name Age of Onset    Cancer Mother      Diabetes Mother      Hypertension Mother      Cancer-Breast Sister      Cancer-Ovarian Sister      Cancer Sister      Diabetes Sister      Heart Disease Sister      Hypertension Sister      Heart Disease Brother      Hypertension Brother      Stroke Brother       Social History     Socioeconomic History    Marital status: Widowed   Tobacco Use    Smoking status: Former     Current packs/day: 0.00     Average packs/day: 1 pack/day for 20.0 years (20.0 ttl pk-yrs)     Types: Cigarettes     Start date: 55     Quit date: 2010     Years since quitting: 14.9     Passive exposure: Never    Smokeless tobacco: Never   Vaping Use    Vaping status: Never Used   Substance and Sexual Activity    Alcohol use: Not Currently    Drug use: Not Currently        Allergies:   Allergies   Allergen Reactions    Bortezomib RASH     Noted in Provider note 8/22 cancer treatment drug    Allopurinol HIVES    Sulfa (Sulfonamide Antibiotics) NAUSEA ONLY        Medications:  Scheduled Meds:amiodarone (CORDARONE) tablet 200 mg, 200 mg, Oral, QDAY  apixaban (ELIQUIS) tablet 5 mg, 5 mg, Oral, BID  budesonide-formoterol HFA (SYMBICORT) 160-4.5 mcg/actuation inhalation 2 puff, 2 puff, Inhalation, BID  [Held by Provider] bumetanide (BUMEX) injection 2 mg, 2 mg, Intravenous, BID  [Held by Provider] carvediloL (COREG) tablet 3.125 mg, 3.125 mg, Oral, BID w/meals  docusate (COLACE) capsule 100 mg, 100 mg, Oral, BID  insulin aspart (U-100) (NOVOLOG FLEXPEN U-100 INSULIN) injection PEN 0-6 Units, 0-6 Units, Subcutaneous, 5 X Daily  pantoprazole DR (PROTONIX) tablet 40 mg, 40 mg, Oral, BID(11-21)  rosuvastatin (CRESTOR) tablet 10 mg, 10 mg, Oral, QDAY  senna (SENOKOT) tablet 1 tablet, 1 tablet, Oral, BID  sertraline (ZOLOFT) tablet 50 mg, 50 mg, Oral, QDAY    Continuous Infusions:   milrinone (PRIMACOR) 20 mg/D5W 100 mL infusion 0.3 mcg/kg/min (04/14/23 1033)     PRN and Respiratory Meds:acetaminophen Q4H PRN, albuterol  sulfate Q4H PRN, alteplase PRN (On Call from Rx), dextrose 50% (D50) IV PRN, melatonin QHS PRN, ondansetron (ZOFRAN) IV Q6H PRN, prochlorperazine edisylate Q6H PRN    Medications Prior to Admission   Medication Sig Dispense Refill Last Dose/Taking    acetaminophen (TYLENOL) 325 mg tablet Take three tablets by mouth every 8 hours as needed for Pain. Indications: pain 40 tablet 0     acyclovir (ZOVIRAX) 400 mg tablet Take one tablet by mouth twice daily.       albuterol sulfate (PROAIR HFA) 90 mcg/actuation HFA aerosol inhaler Inhale two puffs by mouth into the lungs every 6 hours as needed for Wheezing or Shortness of Breath.       amiodarone (CORDARONE) 200 mg tablet Take one tablet by mouth daily. Indications: prevention of recurrent atrial fibrillation 90 tablet 1     apixaban (ELIQUIS) 5 mg tablet Take one tablet by mouth twice daily. Indications: afib 60 tablet 0     budesonide-formoterol HFA (SYMBICORT) 160-4.5 mcg/actuation aerosol inhaler Inhale two puffs by mouth into the lungs twice daily. Indications: bronchospasm prevention with COPD 30.6 g 0     carvediloL (COREG) 3.125 mg tablet Take one tablet by mouth twice daily with meals. Take with food.  Indications: heart failute with rEF 180 tablet 0     cholecalciferol (VITAMIN D) 1,000 units tablet Take one tablet by mouth daily.       cyanocobalamin (VITAMIN B-12) 1,000 mcg tablet Take 5,000 Units by mouth daily.       dexAMETHasone (DECADRON) 4 mg tablet Take two tablets by mouth every 7 days. Patient is taking 8mg  on Thursdays prior to chemo       empagliflozin ([name]) 25 mg tablet Take one tablet by mouth daily.       EMU OIL (BULK) MISC Use  as directed. Apply topically as needed       febuxostat (ULORIC) 40 mg tablet Take one-half tablet by mouth daily. Indications: taken on chemo days 45 tablet 0     ferrous sulfate (FEOSOL) 325 mg (65 mg iron) tablet Take one tablet by mouth daily. Take on an empty stomach at least 1 hour before or 2 hours after food.       insulin detemir U-100 (LEVEMIR FLEXTOUCH) 100 unit/mL (3 mL) injection pen Resume after speaking with your PCP or endocrinology  Indications: type 2 diabetes mellitus       milrinone lactate/D5W (MILRINONE IN D5W IV) Administer  through vein. Milrinone 80mg /249ml  0.68mcg/kg/min intravenously continuous infusion. Dosing wt 78.3 kg       nitroglycerin (NITROSTAT) 0.4 mg tablet Place one tablet under tongue every 5 minutes as needed for Chest Pain.       pantoprazole DR (PROTONIX) 40 mg tablet Take one tablet by mouth twice daily. Indications: gastritis 112 tablet 0     potassium chloride SR (K-DUR) 20 mEq tablet Resume after cardiology follow up  Indications: prevention of low potassium in the blood       rosuvastatin (CRESTOR) 20 mg tablet Take one-half tablet by mouth daily. Indications: excessive fat in the blood 90 tablet 0     sertraline (ZOLOFT) 50 mg tablet Take one tablet by mouth daily.       torsemide (DEMADEX) 20 mg tablet Resume after cardiology follow up  Indications: accumulation of fluid resulting from chronic heart failure           Laboratory Review:   CBC w/Diff    Lab Results   Component  Value Date/Time    WBC 8.90 04/14/2023 04:07 AM    RBC 2.67 (L) 04/14/2023 04:07 AM    HGB 8.2 (L) 04/14/2023 04:07 AM    HCT 23.9 (L) 04/14/2023 04:07 AM    MCV 89.6 04/14/2023 04:07 AM    MCH 30.6 04/14/2023 04:07 AM    MCHC 34.1 04/14/2023 04:07 AM    RDW 19.8 (H) 04/14/2023 04:07 AM    PLTCT 271 04/14/2023 04:07 AM    MPV 8.5 04/14/2023 04:07 AM    Lab Results   Component Value Date/Time    NEUT 62 03/31/2023 03:42 AM    ANC 7.10 (H) 03/31/2023 03:42 AM    LYMA 15 (L) 03/31/2023 03:42 AM    ALC 1.80 03/31/2023 03:42 AM    MONA 13 (H) 03/31/2023 03:42 AM    AMC 1.50 (H) 03/31/2023 03:42 AM    EOSA 8 (H) 03/31/2023 03:42 AM    AEC 0.90 (H) 03/31/2023 03:42 AM    BASA 1 03/31/2023 03:42 AM    ABC 0.10 03/31/2023 03:42 AM         Chemistry    Lab Results   Component Value Date/Time    NA 141 04/14/2023 04:07 AM    K 3.3 (L) 04/14/2023 04:07 AM    CL 109 04/14/2023 04:07 AM    CO2 19 (L) 04/14/2023 04:07 AM    GAP 13 (H) 04/14/2023 04:07 AM    BUN 40 (H) 04/14/2023 04:07 AM    CR 3.19 (H) 04/14/2023 04:07 AM    GLU 150 (H) 04/14/2023 04:07 AM    MG 2.0 04/14/2023 04:07 AM    Lab Results   Component Value Date/Time    CA 8.1 (L) 04/14/2023 04:07 AM    PO4 4.5 04/14/2023 04:07 AM    ALBUMIN 3.3 (L) 04/14/2023 04:07 AM    TOTPROT 5.1 (L) 04/14/2023 04:07 AM    ALKPHOS 102 04/14/2023 04:07 AM    AST 41 (H) 04/14/2023 04:07 AM    ALT 58 (H) 04/14/2023 04:07 AM    TOTBILI 0.9 04/14/2023 04:07 AM    GFR 14 (L) 04/14/2023 04:07 AM          Lipid Profile INR   Lab Results   Component Value Date    CHOL 107 09/16/2022    TRIG 72 09/16/2022    HDL 50 09/16/2022    LDL 45 09/16/2022    VLDL 14 09/16/2022    NONHDLCHOL 57 09/16/2022         Lab Results   Component Value Date    INR 3.0 (H) 04/11/2023          Chest X-Ray:  04/11/2023 CHF, with moderate cardiomegaly, pulmonary edema, and small   pleural effusions.      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 with PR of 226     Echocardiogram Details on 04/12/23 :     Eccentric hypertrophy of the left ventricle with severe dilatation of the left ventricular chamber  Left ventricular systolic function is moderately to severely impaired with an estimate ejection fraction of 30% and global hypokinesis  Right ventricle is moderately dilated with normal function  The left atrium is severely dilated  Pacemaker lead seen in the right atrium and right ventricle  Severe mitral valve insufficiency  Severe tricuspid valve insufficiency  No pericardial effusion  Severe pulmonary hypertension with estimated peak systolic pulmonary artery pressure of 73 mmHg     In comparison to  previous echocardiogram from May 2024 the mitral insufficiency was graded as moderate to severe although on my visual comparison it appears similar (severe) to the present study          Pt examined and discussed with Dr. Marin Comment Vena Bassinger  Advanced Heart Failure and Heart Transplant Fellow   Available on Voalte

## 2023-04-14 NOTE — Progress Notes
[facility] END OF SHIFT/PLAN OF CARE NURSING NOTE   Nursing Shift: day shift 0700-1930    Acute events, nursing interventions, & communication with providers:   1 PRBC given.   1815: Pt expressing concern that her abdomen is starting to feel full like it did when she came in. She says it is not new after the blood started and it's not tight like it was on admission but she does notice it feels more full, she does not feel hungry. 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 She reported that her mother had pancreatic cancer. This RN informed pt this information will be passed along and addressed with rounds tomorrow.       Patient Goal(s)  Patient will Reach and maintain goal dry weight by the end of next shift.        Patient will  Verbalize readiness for discharge by discharge.   Admission Weight: Weight: 80.7 kg (178 lb)    Last 3 Weights:   Vitals:    04/12/23 0425 04/12/23 1312 04/13/23 0500   Weight: 78.8 kg (173 lb 11.6 oz) 78.3 kg (172 lb 9.6 oz) 78.3 kg (172 lb 9.6 oz)     Weight Change: Weight trend stable    Intake/Output Summary (Last 24 hours) at 04/13/2023 1815  Last data filed at 04/13/2023 1500  Gross per 24 hour   Intake 836.98 ml   Output 555 ml   Net 281.98 ml     Last Bowel Movement Date:  (pta)    Fluid Restriction? Yes 1.5 L   Quality/Safety    Total Fall Risk Score: 15   Risk for Injury related to falls: Bones susceptible to fracture  Fall Risk Category:   History of More Than One Fall Within 6 Months Before Admission: No  Elimination, Bowel and Urine: Incontinence OR urgency OR frequency  Interventions: Use of bladder management device (e.g., female/female external urinary containment device)   Medications: On 2 or more high fall risk drugs  Interventions: Stay within arm's reach during toileting/showering (i.e., dizziness, orthostasis) , Educate patient on medication side effects, and Bed/chair alarm (i.e., change in mental status)   Patient Care Equipment: Two present  Interventions: Needs assistance with patient care equipment when ambulating and Ensure environment is free of clutter and walkways are clear from tripping hazards  Mobility: 2 - Assistance required, 2 - Unsteady gait  Interventions: Assist x1 and Utilize walker, cane, or additional walking aid for ambulation  Cognition: 0 - No cognition issues  Interventions: N/A - Does not score as risk in this category    Other safety precautions in place: CLABSI Prevention Bundle    Restraints:  No      Patient Education  This RN provided education to Patient today. The following education topics were reviewed:  Quality/Safety Education:   CLABSI prevention and Fall risk  Medication Education:   Medication management (Indication, adverse effects, monitoring, etc)  Education provided on the following medication(s): all meds given this shift  Cardiac - Specific Education:   Heart failure management and Cardiac diet/nutrition  General Education:   Bowel/Urinary elimination, Diet/nutrition, Mobility/Activity intolerance, and Nausea/vomiting    The following teaching method(s) were used: Verbal  Response to learning: Verbalizes Understanding  Needs reinforcement on: n/a

## 2023-04-14 NOTE — Progress Notes
General Progress Note    Name: Raven [name]        MRN: [id number]          DOB: 07/07/1945            Age: 77 y.o.  Admission Date: 04/11/2023       LOS: 1 day    Date of Service: 04/14/2023    Raven [name] is a 77 year old woman admitted with HFrEF exacerbation. 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   -in the ED: dyspneic, normotensive, not hypoxic. Restless when trying to lay flat.   -NT-pro-BNP 12,291, creatinine 2.79, bicarb 17, AG 11, lactate 1.4, lipase 12, INR 3.0, WBC 11.2, Hb 8.4, platelet 318.  -troponin 34 -> 28  -influenza/covid/RSV swab negative   -04/11/23 CXR: bilateral pleural effusions   -last echo May 2024: LVEF 30%, moderate-severe MR, severe TR  -received IV lasix 40mg  x1 in the ED  -when verifying milrinone dosing, pharmacist noted that pump has been off since ~ noon of 04/11/23. Port has not been flushing. Will try to troubleshoot with alteplase and resume milrinone via PIV if not able to use port-a-cath.    Per son 12/18, no issues with Milrinone pump at home, no alarms or issues even while in ER     Intake/Output Summary (Last 24 hours) at 04/14/2023 1629  Last data filed at 04/14/2023 1500  Gross per 24 hour   Intake 922.25 ml   Output 800 ml   Net 122.25 ml     Vitals:    04/12/23 1312 04/13/23 0500 04/14/23 0409   Weight: 78.3 kg (172 lb 9.6 oz) 78.3 kg (172 lb 9.6 oz) 78.3 kg (172 lb 9.6 oz) Plan  -Tele monitoring   --strict I's and O's, fluid restricion  -replete lytes PRN. Goal K 4.0 and Mag 2.0  -PTA milrinone infusion. Continue current dose   -Consulted HF Cardiology  Hold diuretics 12/18, lack of significant response from diuretics/albumin on 12/17   NPO @ [facility] for possible RHC 12/19 if continued sub-optimal UO and renal dysfunction or if LFT trending up   Hold PTA Coreg for now with marginal BPs     AKI on CKD stage 4  Chronic metabolic acidosis  -baseline creatinine ~ 2.0-2.2  -AKI likely secondary to cardiorenal syndrome, renal function worsening 12/17      COPD  Subcentimeter nodular opacities in lungs  -CT A/P 12/6: Tree-in-bud and multiple subcentimeter nodular opacities are   indeterminate.   -Follow-up CT chest in 6-8 weeks recommended for reevaluation.   -PTA symbicort. Continue      Type 2 diabetes  -PTA jardiance and levemir. Holding  -LDCF      Leukocytosis, chronic  - resolved   Chronic anemia  Multiple Myeloma  -follows at [name] H/O, on outpatient daratumumab and darbepoetin  -PTA acyclovir prophylaxis. Continue      Diverticulosis  H/O melena and hematemesis  -12/2 EGD: 1.5  cm friable polyp in duodenum s/p clip x2, no bleeding seen   -12/8 EGD with ulcer in duodenum and visible vessel, no active bleeding.  Epinephrine injection utilized at pace with thermal therapy and mechanical therapy of Endo Clip placed.  -Received 2 units pRBC 12/9  -Hgb 7.9 on admission, was 7.2 at discharge on 12/13, confirmed she was only taking Eliquis at discharge and did not resume ASA and Plavix, as instructed   -12/17 AM Hgb 7.0, stools recorded as brown, repeat 6.9   Transfused 1u pRBC  PLAN  -Hgb 8.2, CTM, transfuse < 7     Pancreatic cyst  -Noted during previous admission  -CT A/P 12/6: Mild increase in size of indeterminate large lobulated cystic mass in the tail of pancreas. Endoscopic ultrasound recommended for further evaluation.   -Plan is for EUS in next month for cystic mass in pancreas, TBD        Consults: HF Cardiology, NPO @ [facility] for possible RHC on 12/19  Nutrition: low sodium diet  VTE PPx: PTA apixaban   Lines/Drains/tubes: PIV, port-a-cath  Code Status: Full Code  Assessment & Plan  Acute on chronic HFrEF (heart failure with reduced ejection fraction) (HCC)      Present on Admission:   Acute on chronic HFrEF (heart failure with reduced ejection fraction) (HCC)                                     Raven Sayers, MD  Hospitalist  Internal Medicine    Voalte is the preferred method of communication.   Please use the Med Private First Call for all patient-related communications. Personal Voaltes and pagers are not answered at all hours.    High medical decision making due to the following:  1 or more chronic illness with severe exacerbation and 1 acute or chronic illness that poses a threat to life or bodily function  Review of notes outside of my specialty, Review of each unique test, Ordering of each unique test, and Assessment requiring an independent historian and discussion of management or test interpretation with cardiology (Dr. Glori [name]) (physician(s) or other qualified health care professional outside of my specialty)  drug therapy requiring intensive monitoring for toxicity IV diuretics, milrinone   _______________________________________________________________________    Subjective   Raven [name] is a 77 y.o. female.  NAEON. Raven [name] is having some nausea/dry heaving today. She says she's still feeling dyspneic with exertion but states this is unchanged from baseline. She denies chest pain. Her son, Raven [name], is at bedside.          Medications  Scheduled Meds:amiodarone (CORDARONE) tablet 200 mg, 200 mg, Oral, QDAY  apixaban (ELIQUIS) tablet 5 mg, 5 mg, Oral, BID  budesonide-formoterol HFA (SYMBICORT) 160-4.5 mcg/actuation inhalation 2 puff, 2 puff, Inhalation, BID  [Held by Provider] bumetanide (BUMEX) injection 2 mg, 2 mg, Intravenous, BID  [Held by Provider] carvediloL (COREG) tablet 3.125 mg, 3.125 mg, Oral, BID w/meals  docusate (COLACE) capsule 100 mg, 100 mg, Oral, BID  insulin aspart (U-100) (NOVOLOG FLEXPEN U-100 INSULIN) injection PEN 0-6 Units, 0-6 Units, Subcutaneous, 5 X Daily  pantoprazole DR (PROTONIX) tablet 40 mg, 40 mg, Oral, BID(11-21)  rosuvastatin (CRESTOR) tablet 10 mg, 10 mg, Oral, QDAY  senna (SENOKOT) tablet 1 tablet, 1 tablet, Oral, BID  sertraline (ZOLOFT) tablet 50 mg, 50 mg, Oral, QDAY    Continuous Infusions:   milrinone (PRIMACOR) 20 mg/D5W 100  mL infusion 0.3 mcg/kg/min (04/14/23 1033)     PRN and Respiratory Meds:acetaminophen Q4H PRN, albuterol sulfate Q4H PRN, alteplase PRN (On Call from Rx), dextrose 50% (D50) IV PRN, melatonin QHS PRN, ondansetron (ZOFRAN) IV Q6H PRN, prochlorperazine edisylate Q6H PRN                Objective                        Vital Signs: Last Filed                 Vital Signs: 24 Hour Range   BP: 111/48 (12/18 1500)  Temp: 37 ?C (98.6 ?F) (12/18 1500)  Pulse: 77 (12/18 1605)  Respirations: 16 PER MINUTE (12/18 1500)  SpO2: 93 % (12/18 1500)  O2 Device: None (Room air) (12/18 1500) BP: (98-123)/(48-71)   Temp:  [36.3 ?C (97.4 ?F)-37.1 ?C (98.7 ?F)]   Pulse:  [77-87]   Respirations:  [16 PER MINUTE-18 PER MINUTE]   SpO2:  [92 %-100 %]   O2 Device: None (Room air)     Vitals:    04/12/23 1312 04/13/23 0500 04/14/23 0409   Weight: 78.3 kg (172 lb 9.6 oz) 78.3 kg (172 lb 9.6 oz) 78.3 kg (172 lb 9.6 oz)       Intake/Output Summary:  (Last 24 hours)    Intake/Output Summary (Last 24 hours) at 04/14/2023 1629  Last data filed at 04/14/2023 1500  Gross per 24 hour   Intake 922.25 ml   Output 800 ml   Net 122.25 ml     Stool Occurrence: 1        Physical Exam  General:  Alert, cooperative, no distress, appears stated age  Head:  Normocephalic, without obvious abnormality, atraumatic  Eyes:  CC  Lungs:  Clear to auscultation bilaterally  Heart:   RRR  Abdomen:  Soft, non-tender.  Bowel sounds normal.  No masses.  No organomegaly.  Extremities: 1+ pitting edema BLLE    Lab Review  24-hour labs:    Results for orders placed or performed during the hospital encounter of 04/11/23 (from the past 24 hours)   POC GLUCOSE    Collection Time: 04/13/23  4:44 PM   Result Value Ref Range    Glucose, POC 206 (H) 70 - 100 mg/dL   POC GLUCOSE    Collection Time: 04/13/23  9:10 PM   Result Value Ref Range    Glucose, POC 172 (H) 70 - 100 mg/dL   POC GLUCOSE    Collection Time: 04/14/23  3:57 AM   Result Value Ref Range    Glucose, POC 154 (H) 70 - 100 mg/dL   CBC    Collection Time: 04/14/23  4:07 AM   Result Value Ref Range    White Blood Cells 8.90 4.50 - 11.00 10*3/uL    Red Blood Cells 2.67 (L) 4.00 - 5.00 10*6/uL    Hemoglobin 8.2 (L) 12.0 - 15.0 g/dL    Hematocrit 19.1 (L) 36.0 - 45.0 %    MCV 89.6 80.0 - 100.0 fL    MCH 30.6 26.0 - 34.0 pg    MCHC 34.1 32.0 - 36.0 g/dL    RDW 47.8 (H) 29.5 - 15.0 %    Platelet Count 271 150 - 400 10*3/uL    MPV 8.5 7.0 - 11.0 fL   COMPREHENSIVE METABOLIC PANEL    Collection Time: 04/14/23  4:07 AM   Result Value Ref Range  Sodium 141 137 - 147 mmol/L    Potassium 3.3 (L) 3.5 - 5.1 mmol/L    Chloride 109 98 - 110 mmol/L    Glucose 150 (H) 70 - 100 mg/dL    Blood Urea Nitrogen 40 (H) 7 - 25 mg/dL    Creatinine 7.10 (H) 0.40 - 1.00 mg/dL    Calcium 8.1 (L) 8.5 - 10.6 mg/dL    Total Protein 5.1 (L) 6.0 - 8.0 g/dL    Total Bilirubin 0.9 0.2 - 1.3 mg/dL    Albumin 3.3 (L) 3.5 - 5.0 g/dL    Alk Phosphatase 626 25 - 110 U/L    AST 41 (H) 7 - 40 U/L    ALT 58 (H) 7 - 56 U/L    CO2 19 (L) 21 - 30 mmol/L    Anion Gap 13 (H) 3 - 12    Glomerular Filtration Rate (GFR) 14 (L) >60 mL/min   MAGNESIUM    Collection Time: 04/14/23  4:07 AM   Result Value Ref Range    Magnesium 2.0 1.6 - 2.6 mg/dL   PHOSPHORUS    Collection Time: 04/14/23  4:07 AM   Result Value Ref Range    Phosphorus 4.5 2.0 - 4.5 mg/dL   POC GLUCOSE    Collection Time: 04/14/23  8:06 AM   Result Value Ref Range    Glucose, POC 148 (H) 70 - 100 mg/dL POC GLUCOSE    Collection Time: 04/14/23 11:34 AM   Result Value Ref Range    Glucose, POC 146 (H) 70 - 100 mg/dL   POC GLUCOSE    Collection Time: 04/14/23  3:46 PM   Result Value Ref Range    Glucose, POC 145 (H) 70 - 100 mg/dL       Point of Care Testing  (Last 24 hours)  Glucose: (!) 150 (04/14/23 0407)  POC Glucose (Download): (!) 145 (04/14/23 1546)    Radiology and other Diagnostics Review:    Pertinent radiology reviewed.       Donzetta Sprung, MD   Pager [phone]

## 2023-04-14 NOTE — Progress Notes
OCCUPATIONAL THERAPY  PROGRESS NOTE      Name: Raven [name]   MRN: [id number]     DOB: Oct 20, 1945      Age: 77 y.o.  Admission Date: 04/11/2023     LOS: 1 day     Date of Service: 04/14/2023      Mobility  Patient Turn/Position: Left  Progressive Mobility Level: Walk in room  Distance Walked (feet): 10 ft (+ seated break + 65ft)  Level of Assistance: Assist X1  Assistive Device: Walker  Activity Limited By: Fatigue;Weakness    Subjective  Significant Hospital Events: 77 year old woman admitted with HFrEF exacerbation. 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  Mental / Cognitive: Alert;Oriented;Cooperative;Follows commands  Pain: Complains of pain;Does not rate pain  Pain level: Before activity;During activity;After activity  Pain Location: Bilateral;Leg  Pain Description:  (soreness)  Pain Interventions: Patient assisted into position of comfort  Comments: On entry/exit, pt in bed w/ alarm on and all needs within reach. RN notified.    Home Living Situation  Lives With: Family (lives with her son and daughter-in-law)  Type of Home: House  Entry Stairs: 2;Rail on both sides  In-Home Stairs: 1 flight;Stair glide  Bathroom Setup: Tub/shower unit  Patient Owned Equipment: Bathing: Soil scientist: Public house manager)  Comments: Reports use of RW within home and use of electric scooter for community distances.    Prior Level of Function  Level Of Independence: Independent with ADL and household mobility with device;Assist needed for IADL  Comments: Pt reports independence w/ ADLs (dressing, showering, toileting). Pt reports his son and DIL assist w/ IADLs (cooking, cleaning, laundry).  Required Assist For: All home functioning ADL;Meal preparation;Medication/finance management;Transportation/driving  History of Falls in Past 3 Months: No    Vision  Corrective Lenses: No hx of corrective lenses    ADL's  Where Assessed: In Bathroom;Standing at [employer]: Stand By Assist  Grooming Deficits: Supervision/Safety;Wash/Dry Hands  LE Dressing Assist: Moderate Assist  LE Dressing Deficits: Thread RLE Into Underwear;Thread LLE Into Underwear  Toileting Assist: Stand By Assist  Toileting Deficits: Supervision/Safety;Clothing Management Up;Perineal Hygiene  Comment: Completes pericare in sitting w/ SBA; requires min A for thoroughness only. Requires mod A to thread BLE through brief and manages brief over hips w/ SBA. Stands at sink w/ SBA to wash hands.    ADL Mobility  Bed Mobility: Supine to Sit: Standby assist (HOB elevated)  Bed Mobility: Sit to Supine: Standby assist (HOB flat)  Bed Mobility Comments: Use of bedrail and increased time.  Transfer Type: Sit to stand  Transfer: Assistance Level: To/from;Bed;Toilet;Minimal assist (CGA)  Transfer: Assistive Device: Roller walker  Transfer: Type of Assistance: For safety considerations  End of Activity Status: In bed;Instructed patient to request assist with mobility;Instructed patient to use call light;Nursing notified  Sitting Balance: Static sitting balance;Independent  Standing Balance: Static standing balance;2 UE support;Independent;Dynamic standing balance;Standby assist  Gait Distance: 10 feet (+ seated break + 39ft)  Gait: Assistance Level: Standby assist;Management of lines;Safety considerations  Gait: Assistive Device: Roller walker  Gait Comments: Ambulates total of ~82ft (~88ft + seated break + ~100ft) w/ RW and SBA. Steady gait with no LOB.    Activity Tolerance  Endurance: 3/5 Tolerates 25-30 Minutes Exercise  w/Multiple Rests  Comment: Activity limited by pt fatigue and reports BLE soreness.    Cognition  Social Interaction: Interacts in a Network engineer  Attention: Awake/Alert    ROM  R UE ROM: WFL   R UE ROM Method: Active  L UE ROM: WFL   L UE ROM Method: Active  Coordination: Adequate to Complete ADLs  Grasp: Bilateral Grasp Functional for Activity    Edema  RUE Edema: No Significant Edema  LUE Edema: No Significant Edema  R Hand Edema: No Significant Edema  L Hand Edema: No Significant Edema    Education  Persons Educated: Patient  Barriers To Learning: None Noted  Teaching Methods: Verbal Instruction  Patient Response: Verbalized Understanding  Topics: Role of OT, Goals for Therapy  Goal Formulation: With Patient    Assessment  Assessment: Decreased Endurance;Decreased High-Level ADLs  Prognosis: Good;w/ Family  Goal Formulation: Patient  Comments: Pt is currently limited by generalized deconditioning and BLE soreness. OT will continue to follow to maximize independence and safety with ADLs and functional mobility throughout acute admission.     AM-PAC 6 Clicks Daily Activity Inpatient  Putting on and taking off regular lower body clothes: A Little  Bathing (Including washing, rinsing, drying): A Little  Toileting, which includes using toilet, bedpan, or urinal: A Little  Putting on and taking off regular upper body clothing: A Little  Taking care of personal grooming such as brushing teeth: None  Eating meals: None  Daily Activity Raw Score: 20  Standardized (T-scale) Score: 42.03    Plan  OT Frequency: 5x/week  OT Plan for Next Visit: stairs; standing ADLs at sink    ADL Goals  Patient Will Perform Grooming: Standing at Sink;w/ Stand By Assist  Patient Will Perform Toileting: w/ Minimum Assist    Functional Transfer Goals  Pt Will Perform All Functional Transfers: w/ Stand By Assist    OT Discharge Recommendations  Recommendation: Home with intermittent supervision/assistance  Patient Currently Requires Physical Assist With: All home functioning ADLs;Bathing;Dressing;Meal preparation;Stairs  Patient Currently Requires Supervision For: ADLs;Mobility  Patient Currently Requires Equipment: Owns what is needed    Therapist: Burnadette Pop, OTD, OTR/L  Date: 04/14/2023

## 2023-04-15 ENCOUNTER — Encounter: Admit: 2023-04-15 | Discharge: 2023-04-15 | Payer: MEDICARE

## 2023-04-15 DIAGNOSIS — K862 Cyst of pancreas: Secondary | ICD-10-CM

## 2023-04-15 DIAGNOSIS — Z90710 Acquired absence of both cervix and uterus: Secondary | ICD-10-CM

## 2023-04-15 DIAGNOSIS — Z951 Presence of aortocoronary bypass graft: Secondary | ICD-10-CM

## 2023-04-15 DIAGNOSIS — E785 Hyperlipidemia, unspecified: Secondary | ICD-10-CM

## 2023-04-15 DIAGNOSIS — Z7951 Long term (current) use of inhaled steroids: Secondary | ICD-10-CM

## 2023-04-15 DIAGNOSIS — J4489 Other specified chronic obstructive pulmonary disease (HCC): Secondary | ICD-10-CM

## 2023-04-15 DIAGNOSIS — N170 Acute kidney failure with tubular necrosis: Secondary | ICD-10-CM

## 2023-04-15 DIAGNOSIS — I081 Rheumatic disorders of both mitral and tricuspid valves: Secondary | ICD-10-CM

## 2023-04-15 DIAGNOSIS — Z8542 Personal history of malignant neoplasm of other parts of uterus: Secondary | ICD-10-CM

## 2023-04-15 DIAGNOSIS — C9000 Multiple myeloma not having achieved remission: Secondary | ICD-10-CM

## 2023-04-15 DIAGNOSIS — Z95810 Presence of automatic (implantable) cardiac defibrillator: Secondary | ICD-10-CM

## 2023-04-15 DIAGNOSIS — D72829 Elevated white blood cell count, unspecified: Secondary | ICD-10-CM

## 2023-04-15 DIAGNOSIS — I255 Ischemic cardiomyopathy: Secondary | ICD-10-CM

## 2023-04-15 DIAGNOSIS — I5043 Acute on chronic combined systolic (congestive) and diastolic (congestive) heart failure: Secondary | ICD-10-CM

## 2023-04-15 DIAGNOSIS — Z888 Allergy status to other drugs, medicaments and biological substances status: Secondary | ICD-10-CM

## 2023-04-15 DIAGNOSIS — Z1152 Encounter for screening for COVID-19: Secondary | ICD-10-CM

## 2023-04-15 DIAGNOSIS — R570 Cardiogenic shock: Secondary | ICD-10-CM

## 2023-04-15 DIAGNOSIS — Z87891 Personal history of nicotine dependence: Secondary | ICD-10-CM

## 2023-04-15 DIAGNOSIS — Z833 Family history of diabetes mellitus: Secondary | ICD-10-CM

## 2023-04-15 DIAGNOSIS — Z7984 Long term (current) use of oral hypoglycemic drugs: Secondary | ICD-10-CM

## 2023-04-15 DIAGNOSIS — Z8249 Family history of ischemic heart disease and other diseases of the circulatory system: Secondary | ICD-10-CM

## 2023-04-15 DIAGNOSIS — I130 Hypertensive heart and chronic kidney disease with heart failure and stage 1 through stage 4 chronic kidney disease, or unspecified chronic kidney disease: Secondary | ICD-10-CM

## 2023-04-15 DIAGNOSIS — Z794 Long term (current) use of insulin: Secondary | ICD-10-CM

## 2023-04-15 DIAGNOSIS — D630 Anemia in neoplastic disease: Secondary | ICD-10-CM

## 2023-04-15 DIAGNOSIS — E8722 Chronic metabolic acidosis: Secondary | ICD-10-CM

## 2023-04-15 DIAGNOSIS — I4891 Unspecified atrial fibrillation: Secondary | ICD-10-CM

## 2023-04-15 DIAGNOSIS — N184 Chronic kidney disease, stage 4 (severe): Secondary | ICD-10-CM

## 2023-04-15 DIAGNOSIS — Z809 Family history of malignant neoplasm, unspecified: Secondary | ICD-10-CM

## 2023-04-15 DIAGNOSIS — Z955 Presence of coronary angioplasty implant and graft: Secondary | ICD-10-CM

## 2023-04-15 DIAGNOSIS — Z823 Family history of stroke: Secondary | ICD-10-CM

## 2023-04-15 DIAGNOSIS — Z7901 Long term (current) use of anticoagulants: Secondary | ICD-10-CM

## 2023-04-15 DIAGNOSIS — Z79899 Other long term (current) drug therapy: Secondary | ICD-10-CM

## 2023-04-15 DIAGNOSIS — I2510 Atherosclerotic heart disease of native coronary artery without angina pectoris: Secondary | ICD-10-CM

## 2023-04-15 DIAGNOSIS — E1122 Type 2 diabetes mellitus with diabetic chronic kidney disease: Secondary | ICD-10-CM

## 2023-04-15 LAB — POC GLUCOSE
~~LOC~~ BKR POC GLUCOSE: 179 mg/dL — ABNORMAL HIGH (ref 70–100)
~~LOC~~ BKR POC GLUCOSE: 119 mg/dL — ABNORMAL HIGH (ref 70–100)
~~LOC~~ BKR POC GLUCOSE: 139 mg/dL — ABNORMAL HIGH (ref 70–100)
~~LOC~~ BKR POC GLUCOSE: 178 mg/dL — ABNORMAL HIGH (ref 70–100)

## 2023-04-15 LAB — MANUAL DIFF

## 2023-04-15 MED ORDER — TORSEMIDE 20 MG PO TAB
20 mg | ORAL_TABLET | Freq: Every day | ORAL | 0 refills | Status: CN
Start: 2023-04-15 — End: ?

## 2023-04-15 MED ORDER — CARVEDILOL 3.125 MG PO TAB
3.125 mg | Freq: Two times a day (BID) | ORAL | 0 refills | Status: SS
Start: 2023-04-15 — End: ?

## 2023-04-15 MED ORDER — TORSEMIDE 20 MG PO TAB
ORAL_TABLET | ORAL | 0 refills | 67.50000 days | Status: DC
Start: 2023-04-15 — End: 2023-04-15

## 2023-04-15 MED ORDER — POTASSIUM CHLORIDE 20 MEQ PO TBTQ
40 meq | Freq: Once | ORAL | 0 refills | Status: CP
Start: 2023-04-15 — End: ?
  Administered 2023-04-15: 17:00:00 40 meq via ORAL

## 2023-04-15 MED ORDER — TORSEMIDE 20 MG PO TAB
20 mg | Freq: Every day | ORAL | 0 refills | Status: DC
Start: 2023-04-15 — End: 2023-04-16

## 2023-04-15 NOTE — Progress Notes
General Progress Note    Name: Raven [name]        MRN: [id number]          DOB: May 20, 1945            Age: 77 y.o.  Admission Date: 04/11/2023       LOS: 3 days    Date of Service: 04/15/2023    Raven [name] is a 77 year old woman admitted with HFrEF exacerbation. 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   -in the ED: dyspneic, normotensive, not hypoxic. Restless when trying to lay flat.   -NT-pro-BNP 12,291, creatinine 2.79, bicarb 17, AG 11, lactate 1.4, lipase 12, INR 3.0, WBC 11.2, Hb 8.4, platelet 318.  -troponin 34 -> 28  -influenza/covid/RSV swab negative   -04/11/23 CXR: bilateral pleural effusions   -last echo May 2024: LVEF 30%, moderate-severe MR, severe TR  -received IV lasix 40mg  x1 in the ED  -when verifying milrinone dosing, pharmacist noted that pump has been off since ~ noon of 04/11/23. Port has not been flushing. Will try to troubleshoot with alteplase and resume milrinone via PIV if not able to use port-a-cath.    Per son 12/18, no issues with Milrinone pump at home, no alarms or issues even while in ER     Intake/Output Summary (Last 24 hours) at 04/15/2023 1336  Last data filed at 04/15/2023 0800  Gross per 24 hour   Intake 492 ml   Output 300 ml   Net 192 ml     Vitals:    04/13/23 0500 04/14/23 0409 04/15/23 0341   Weight: 78.3 kg (172 lb 9.6 oz) 78.3 kg (172 lb 9.6 oz) 77.6 kg (171 lb)     Plan  -Tele monitoring   --strict I's and O's, fluid restricion  -replete lytes PRN. Goal K 4.0 and Mag 2.0  -PTA milrinone infusion. Continue current dose   -Consulted HF Cardiology  Hold diuretics 12/18, lack of significant response from diuretics/albumin on 12/17   Cr and LFTs improving 12/19, will discharge home with PTA Torsemide 20 mg to resume 12/20  Hold PTA Coreg for now with marginal BPs, hold until cardiology follow-up  Repeat labs: NT-Pro-BNP, Mg, CMP in 1 week and HF f/u requested      AKI on CKD stage 4  Chronic metabolic acidosis  -baseline creatinine ~ 2.0-2.2  -AKI likely secondary to cardiorenal syndrome, renal function worsening 12/17 --> subsequently improving       COPD  Subcentimeter nodular opacities in lungs  -CT A/P 12/6: Tree-in-bud and multiple subcentimeter nodular opacities are   indeterminate.   -Follow-up CT chest in 6-8 weeks recommended for reevaluation.   -PTA symbicort. Continue      Type 2 diabetes  -PTA jardiance and levemir. Holding  -LDCF      Leukocytosis, chronic  - resolved   Chronic anemia  Multiple Myeloma  -follows [facility], on outpatient daratumumab and darbepoetin  -  PTA acyclovir prophylaxis. Continue      Diverticulosis  H/O melena and hematemesis  -12/2 EGD: 1.5 cm friable polyp in duodenum s/p clip x2, no bleeding seen   -12/8 EGD with ulcer in duodenum and visible vessel, no active bleeding.  Epinephrine injection utilized at pace with thermal therapy and mechanical therapy of Endo Clip placed.  -Received 2 units pRBC 12/9  -Hgb 7.9 on admission, was 7.2 at discharge on 12/13, confirmed she was only taking Eliquis at discharge and did not resume ASA and Plavix, as instructed   -12/17 AM Hgb 7.0, stools recorded as brown, repeat 6.9   Transfused 1u pRBC  PLAN  -Hgb 8.4, CTM, transfuse < 7     Pancreatic cyst  -Noted during previous admission  -CT A/P 12/6: Mild increase in size of indeterminate large lobulated cystic mass in the tail of pancreas. Endoscopic ultrasound recommended for further evaluation.   -Plan is for EUS in next month for cystic mass in pancreas, TBD        Consults: HF Cardiology  Nutrition: low sodium diet  VTE PPx: PTA apixaban   Lines/Drains/tubes: PIV, port-a-cath  Code Status: Full Code  Dispo: Discharge home on 04/15/23   Assessment & Plan  Acute on chronic HFrEF (heart failure with reduced ejection fraction) (HCC)      Present on Admission:   Acute on chronic HFrEF (heart failure with reduced ejection fraction) (HCC)                                     Ellwood Sayers, MD  Hospitalist  Internal Medicine    Voalte is the preferred method of communication.   Please use the Med Private First Call for all patient-related communications. Personal Voaltes and pagers are not answered at all hours.    Discussed with cardiology team.     Discharge Planning: greater than 30 minutes spent in counseling pt, coordinating discharge care, placing discharge orders and complete discharge summary.     _______________________________________________________________________    Subjective   Raven [name] is a 77 y.o. female.  NAEON. Raven [name] is doing well today. She says she's feeling really good. Her breathing is at baseline. She denies chest pain, lightheadedness. She feels well enough to go home. We discussed discharge recommendations and follow-up. Her son is at bedside during rounds.          Medications  Scheduled Meds:amiodarone (CORDARONE) tablet 200 mg, 200 mg, Oral, QDAY  apixaban (ELIQUIS) tablet 5 mg, 5 mg, Oral, BID  budesonide-formoterol HFA (SYMBICORT) 160-4.5 mcg/actuation inhalation 2 puff, 2 puff, Inhalation, BID  [Held by Provider] carvediloL (COREG) tablet 3.125 mg, 3.125 mg, Oral, BID w/meals  docusate (COLACE) capsule 100 mg, 100 mg, Oral, BID  insulin aspart (U-100) (NOVOLOG FLEXPEN U-100 INSULIN) injection PEN 0-6 Units, 0-6 Units, Subcutaneous, 5 X Daily  pantoprazole DR (PROTONIX) tablet 40 mg, 40 mg, Oral, BID(11-21)  rosuvastatin (CRESTOR) tablet 10 mg, 10 mg, Oral, QDAY  senna (SENOKOT) tablet 1 tablet, 1 tablet, Oral, BID  sertraline (ZOLOFT) tablet 50 mg, 50 mg, Oral, QDAY  [START ON 04/16/2023] torsemide (DEMADEX) tablet 20 mg, 20 mg, Oral, QDAY    Continuous Infusions:   milrinone (PRIMACOR) 20 mg/D5W 100 mL infusion 0.3 mcg/kg/min (04/15/23 0114)     PRN and Respiratory Meds:acetaminophen Q4H PRN, albuterol sulfate Q4H PRN, alteplase PRN (On Call from Rx), dextrose 50% (D50) IV PRN, melatonin  QHS PRN, ondansetron (ZOFRAN) IV Q6H PRN, prochlorperazine edisylate Q6H PRN                Objective                        Vital Signs: Last Filed                 Vital Signs: 24 Hour Range   BP: 113/55 (12/19 1103)  Temp: 36.4 ?C (97.5 ?F) (12/19 1103)  Pulse: 81 (12/19 1103)  Respirations: 18 PER MINUTE (12/19 1103)  SpO2: 94 % (12/19 1103)  O2%: 21 % (12/18 2254)  O2 Device: None (Room air) (12/19 1103) BP: (107-118)/(48-62)   Temp:  [36.2 ?C (97.2 ?F)-37.1 ?C (98.8 ?F)]   Pulse:  [77-86]   Respirations:  [16 PER MINUTE-20 PER MINUTE]   SpO2:  [92 %-98 %]   O2%:  [21 %]   O2 Device: None (Room air)     Vitals:    04/13/23 0500 04/14/23 0409 04/15/23 0341   Weight: 78.3 kg (172 lb 9.6 oz) 78.3 kg (172 lb 9.6 oz) 77.6 kg (171 lb)       Intake/Output Summary:  (Last 24 hours)    Intake/Output Summary (Last 24 hours) at 04/15/2023 1336  Last data filed at 04/15/2023 0800  Gross per 24 hour   Intake 492 ml   Output 300 ml   Net 192 ml     Stool Occurrence: 1        Physical Exam  General:  Alert, cooperative, no distress, appears stated age  Head:  Normocephalic, without obvious abnormality, atraumatic  Eyes:  CC  Lungs:  Clear to auscultation bilaterally  Heart:   RRR  Abdomen:  Soft, non-tender.  Bowel sounds normal.  No masses.  No organomegaly.  Extremities: No pitting edema in BLLE     Lab Review  24-hour labs:    Results for orders placed or performed during the hospital encounter of 04/11/23 (from the past 24 hours) POC GLUCOSE    Collection Time: 04/14/23  3:46 PM   Result Value Ref Range    Glucose, POC 145 (H) 70 - 100 mg/dL   POC GLUCOSE    Collection Time: 04/14/23  8:45 PM   Result Value Ref Range    Glucose, POC 179 (H) 70 - 100 mg/dL   CBC    Collection Time: 04/15/23  3:47 AM   Result Value Ref Range    White Blood Cells 9.20 4.50 - 11.00 10*3/uL    Red Blood Cells 2.82 (L) 4.00 - 5.00 10*6/uL    Hemoglobin 8.4 (L) 12.0 - 15.0 g/dL    Hematocrit 40.1 (L) 36.0 - 45.0 %    MCV 91.3 80.0 - 100.0 fL    MCH 29.8 26.0 - 34.0 pg    MCHC 32.6 32.0 - 36.0 g/dL    RDW 02.7 (H) 25.3 - 15.0 %    Platelet Count 292 150 - 400 10*3/uL    MPV 8.4 7.0 - 11.0 fL   COMPREHENSIVE METABOLIC PANEL    Collection Time: 04/15/23  3:47 AM   Result Value Ref Range    Sodium 144 137 - 147 mmol/L    Potassium 3.6 3.5 - 5.1 mmol/L    Chloride 112 (H) 98 - 110 mmol/L    Glucose 148 (H) 70 - 100 mg/dL    Blood Urea Nitrogen 37 (H) 7 - 25 mg/dL  Creatinine 2.87 (H) 0.40 - 1.00 mg/dL    Calcium 8.3 (L) 8.5 - 10.6 mg/dL    Total Protein 5.1 (L) 6.0 - 8.0 g/dL    Total Bilirubin 0.8 0.2 - 1.3 mg/dL    Albumin 3.3 (L) 3.5 - 5.0 g/dL    Alk Phosphatase 454 25 - 110 U/L    AST 24 7 - 40 U/L    ALT 48 7 - 56 U/L    CO2 19 (L) 21 - 30 mmol/L    Anion Gap 13 (H) 3 - 12    Glomerular Filtration Rate (GFR) 16 (L) >60 mL/min   MAGNESIUM    Collection Time: 04/15/23  3:47 AM   Result Value Ref Range    Magnesium 2.0 1.6 - 2.6 mg/dL   PHOSPHORUS    Collection Time: 04/15/23  3:47 AM   Result Value Ref Range    Phosphorus 4.1 2.0 - 4.5 mg/dL   MANUAL DIFF    Collection Time: 04/15/23  3:47 AM   Result Value Ref Range    ANISO Present     Target Present     Platelet Estimate Normal     Segmented Neutrophils 74 41 - 77 %    Lymphocytes 9 (L) 24 - 44 %    Monocytes 8 4 - 12 %    Eosinophil 8 (H) 0 - 5 %    Myelocyte 1 %    nRBCs (Manual Diff) 6 10*3/uL   POC GLUCOSE    Collection Time: 04/15/23  3:48 AM   Result Value Ref Range    Glucose, POC 178 (H) 70 - 100 mg/dL   POC GLUCOSE    Collection Time: 04/15/23  7:23 AM   Result Value Ref Range    Glucose, POC 139 (H) 70 - 100 mg/dL   POC GLUCOSE    Collection Time: 04/15/23 11:05 AM   Result Value Ref Range    Glucose, POC 119 (H) 70 - 100 mg/dL       Point of Care Testing  (Last 24 hours)  Glucose: (!) 148 (04/15/23 0347)  POC Glucose (Download): (!) 119 (04/15/23 1105)    Radiology and other Diagnostics Review:    Pertinent radiology reviewed.       Donzetta Sprung, MD   Pager [phone]

## 2023-04-15 NOTE — Progress Notes
Heart Failure Progress Note    NAME:Raven [name]             MRN: [id number]                 DOB:1946/01/14          AGE: 77 y.o.  ADMISSION DATE: 04/11/2023             DAYS ADMITTED: LOS: 2 days      Principal Problem:    Acute on chronic HFrEF (heart failure with reduced ejection fraction) (HCC)          Reason for Consultation:  Evaluation and recommendations re: Heart failure and management of diuretics        History of Present Illness: Raven [name] is a 77 y.o. 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 She was recently admitted and discharged from 04/02/2023 till 04/09/23 for GI bleed and required 2 units of pRBC. Her home torsemide was held due to bump in her creatinine with plans to follow up with Cardiology outpatient.    After the discharge, patient on 12/14 started to have SOB with minimum exertion and eventually during the rest and she reports fluid buildup mainly on her legs and abdomen and she had abdominal bloating/she was not able even to drink water. She also endorsees orthopnea. Furthermore, patient million pump was found to be off since 04/11/2023 noon when her son helped her and changing it afterward she slept and woke up and never checked the pump again till she presents the hospital she reported she may was sleeping and did not hear the alarm.  On admission, patient was given lasix 40 mg IV once.      Recommendations:  Diuretics: hold diuretic today as well 12/19.Kidney function and LFT are improving. Patient will be discharged today 04/15/23. Please restart PTA torsemide 20 mg daily from 12/20. Follow up with HF clinic in a week for CBC, CMP, NTproBNP and mag  Resumed diet, no need for RHC as her lab numbers are improved   GDMT: Continue to hold home coreg until she sees cardiology out patient., currently other GDMT is not an option due to her kidney function.   Continue home milrinone at 0.3  Standing daily weight as well as strict I&Os   Thank you for the consult, HF team will sign off. Please reach out with any question      Ongoing:  BMP once a day. Magnesium level daily.  Keep Potassium greater than 4.0 and Magnesium greater than 2.0.  2000mg  sodium dietary restriction.  Fluid Restriction:1.5  Strict I/O. Goal output: net neg 2L/24 hour  Daily standing scale weight. Goal Dry Weight: TBD    Please place a specimen in lab NT-proBNP order on day of discharge in effort to establish baseline value correlating with suspected euvolemia.      Primary team responsible for placing Post-Discharge Health System Appointment Request order set for Cardiology: Heart Failure appointment request within 24-48 hours of discharge. (Please do not place order any earlier in effort to reduce possible need for cancellation and rescheduling of visit).    Subjective:  She reports feeling good and wants to go home.   She feels that she is back to her baseline   No bleeding reported  Son was in the room, all questions were answered. No CP or SOB.   Still not making much urine as the goal     Objective:                       Vital Signs:  Last Filed                Vital Signs: 24 Hour Range   BP: 118/59 (12/19 0720)  Temp: 36.2 ?C (97.2 ?F) (12/19 0720)  Pulse: 80 (12/19 0720)  Respirations: 18 PER MINUTE (12/19 0720)  SpO2: 96 % (12/19 0720)  O2%: 21 % (12/18 2254)  O2 Device: CPAP/BiPAP (12/19 0720)  BP: (98-118)/(48-62)   Temp:  [36.2 ?C (97.2 ?F)-37.1 ?C (98.8 ?F)]   Pulse:  [77-86]   Respirations:  [16 PER MINUTE-20 PER MINUTE]   SpO2:  [92 %-98 %]   O2%:  [21 %]   O2 Device: CPAP/BiPAP Wt Readings from Last 10 Encounters:   04/15/23 77.6 kg (171 lb)   04/06/23 75.9 kg (167 lb 5.3 oz)   03/26/23 74.2 kg (163 lb 9.3 oz)   03/19/23 77 kg (169 lb 12.8 oz)   02/16/23 75.3 kg (166 lb)   02/15/23 75.3 kg (166 lb)   01/04/23 75.2 kg (165 lb 12.8 oz)   12/14/22 74.6 kg (164 lb 6.4 oz)   11/30/22 79.2 kg (174 lb 9.6 oz)   11/17/22 78.4 kg (172 lb 12.8 oz)       Physical Exam:    General Appearance: no distress, overweight  Neck Veins: JVP is elevated but patient also has severe TR, HJR positive  Auscultation: breathing comfortably, lungs clear to auscultation, no rales or rhonchi, no wheezing  Cardiac Auscultation: Regular rhythm, S1, S2, no S3 or S4, pan systolic murmur  Lower Extremity Edema: 1+ bilateral LL edema    Abdominal Exam: soft, non-tender, bowel sounds normal  Orientation: clear historian, good insight      Assessment:   Acute Exacerbation systolic and diastolic HFrEF,  EF: 30%.   Major Complications or Comorbidities [facility]): acute respiratory failure and acute/ acute on chronic systolic and/or diastolic heart failure  NYHA functional class IV (unable to carry on any physical activity without symptoms of HF, or symptoms of HF at rest),   ACC Stage D (refractory HF requiring specialized interventions).   She presents with signs of hypervolemia with Left  ventricular failure with signs of low flow state.    BNP Labs:   Lab Results   Component Value Date    NTPROBNP 11,324 (H) 04/12/2023    NTPROBNP 12,291 (H) 04/11/2023    NTPROBNP 3,269 (H) 02/17/2023       High Sensitivity Troponin:  Lab Results   Component Value Date    HSTROP0HR 34 (H) 04/11/2023    HSTROP2HR 35 (H) 04/12/2023    HSTROP4HR 28 (H) 04/12/2023    HSTROPDELT4 -7 04/12/2023    HSTROPDELTA 1 04/12/2023    HIGHSTROPI 28 (H) 04/12/2023         Admission Weight: 80.7 kg (178 lb)        Most recent weights (inpatient):   Vitals:    04/13/23 0500 04/14/23 0409 04/15/23 0341   Weight: 78.3 kg (172 lb 9.6 oz) 78.3 kg (172 lb 9.6 oz) 77.6 kg (171 lb)  I/O:       Intake/Output Summary (Last 24 hours) at 04/15/2023 0981  Last data filed at 04/15/2023 0341  Gross per 24 hour   Intake 492 ml   Output 300 ml   Net 192 ml     Net IO Since Admission: 474.88 mL [04/15/23 0822]      PLAN:  Diuretic Therapy    Prior to admission dose    Torsemide 20 mg daily was held prior to last discharge   Given on admission    Lasix 40 mg IV once    Daily Dosing   Lasix 40 mg IV daily on 12/16  Bumex 2 mg twice daily IV on 12/17  Diuretics on hold due to kidney dysfunction        Guideline Directed Medical Therapy PTA Inpatient Changes   ACEI/ARB No  Renal dysfunction    ARNI No Renal dysfunction    BB Yes, Coreg 3.125 mg BID  Held on 12/26   SGLT-2 Inhibitor Hx of UTI and low GFR     Mineralocorticoid Receptor Antagonist No Renal dysfunction    Hydralazine/Nitrate No Hypotension    Ivabradine No; Hypotension    Heart Rhythm Management Therapy Yes (CRT-D)    Anticoagulation for Afib/flutter apixaban (Eliquis) 5 mg BID    Cardiac Rehab Evaluation for LVEF < or equal to 40% Yes; Date ordered: 04/12/2023    7-Day post hospital follow up scheduled within 24-48 hours of discharge No Discharge not expected within the next 24-48 hours        Most Recent Cardiac Testing/Procedures      # Anemia of chronic illness  - received a unit of blood on 04/13/23     #CAD S/P PCI and CABG   - trop 34-35-28     # AKI on CKD  with baseline 2.0 - 2.2   - Cr is trending down   -  Can resume her PTA torsemide tomorrow      #AFIB  - continue eliquis 5 mg BID   - on home amiodarone 200 mg daily       #CRTD in situ    # hypokalemia   - replaced     Past Medical History:    Anxiety    Arthritis    Asthma    Asymptomatic stenosis of right carotid artery    Back pain    Cancer of uterus (HCC)    COPD (chronic obstructive pulmonary disease) (HCC)    Coronary artery disease    Diabetes mellitus (HCC)    Dyslipidemia    Flank mass    Fluid retention    GI bleed    Heart disease Hypertension    Other malignant neoplasm without specification of site    Sleep apnea    Vision decreased     Surgical History:   Procedure Laterality Date    ROBOT ASSISTED LOW ANTERIOR RESECTION N/A 08/10/2022    Performed by Benetta Spar, MD at [facility] OR    ESOPHAGOGASTRODUODENOSCOPY WITH BIOPSY - FLEXIBLE N/A 09/17/2022    Performed by Tempie Hoist, DO at [facility] ENDO    CATHETERIZATION RIGHT HEART N/A 09/18/2022    Performed by Cath, Physician at [facility] CATH LAB    REMOVAL AND REPLACEMENT IMPLANTABLE DEFIBRILLATOR GENERATOR - SINGLE LEAD SYSTEM Left 11/04/2022    Performed by Kathreen Cornfield, MD at [facility] EP LAB    INSERTION/ REPLACEMENT PERMANENT PACEMAKER WITH ATRIAL LEAD Left 11/04/2022    Performed by  Kathreen Cornfield, MD at [facility] EP LAB    Injection Venography Extremity Left 11/04/2022    Performed by Kathreen Cornfield, MD at [facility] EP LAB    ESOPHAGOGASTRODUODENOSCOPY WITH BIOPSY - FLEXIBLE N/A 03/29/2023    Performed by Buckles, Vinnie Level, MD at [facility] ENDO    ESOPHAGOGASTRODUODENOSCOPY WITH SNARE REMOVAL TUMOR/ POLYP/ OTHER LESION - FLEXIBLE N/A 03/29/2023    Performed by Buckles, Vinnie Level, MD at [facility] ENDO    ESOPHAGOGASTRODUODENOSCOPY WITH CONTROL OF BLEEDING - FLEXIBLE N/A 04/04/2023    Performed by Buckles, Vinnie Level, MD at [facility] ENDO    BONE MARROW BIOPSY      CARDIAC DEFIBRILLATOR PLACEMENT      [employer]    CARDIAC SURGERY      CABG 3 V    CAROTID ENDARDECTOMY Right     COLONOSCOPY      HX CORONARY STENT PLACEMENT      HX HEART CATHETERIZATION      HX HYSTERECTOMY      TUNNELED VENOUS PORT PLACEMENT Right     Chest     Family History   Problem Relation Name Age of Onset    Cancer Mother      Diabetes Mother      Hypertension Mother      Cancer-Breast Sister      Cancer-Ovarian Sister      Cancer Sister      Diabetes Sister      Heart Disease Sister      Hypertension Sister      Heart Disease Brother      Hypertension Brother      Stroke Brother       Social History     Socioeconomic History    Marital status: Widowed Tobacco Use    Smoking status: Former     Current packs/day: 0.00     Average packs/day: 1 pack/day for 20.0 years (20.0 ttl pk-yrs)     Types: Cigarettes     Start date: 79     Quit date: 2010     Years since quitting: 14.9     Passive exposure: Never    Smokeless tobacco: Never   Vaping Use    Vaping status: Never Used   Substance and Sexual Activity    Alcohol use: Not Currently    Drug use: Not Currently        Allergies:   Allergies   Allergen Reactions    Bortezomib RASH     Noted in Provider note 8/22 cancer treatment drug    Allopurinol HIVES    Sulfa (Sulfonamide Antibiotics) NAUSEA ONLY        Medications:  Scheduled Meds:amiodarone (CORDARONE) tablet 200 mg, 200 mg, Oral, QDAY  apixaban (ELIQUIS) tablet 5 mg, 5 mg, Oral, BID  budesonide-formoterol HFA (SYMBICORT) 160-4.5 mcg/actuation inhalation 2 puff, 2 puff, Inhalation, BID  [Held by Provider] bumetanide (BUMEX) injection 2 mg, 2 mg, Intravenous, BID  [Held by Provider] carvediloL (COREG) tablet 3.125 mg, 3.125 mg, Oral, BID w/meals  docusate (COLACE) capsule 100 mg, 100 mg, Oral, BID  insulin aspart (U-100) (NOVOLOG FLEXPEN U-100 INSULIN) injection PEN 0-6 Units, 0-6 Units, Subcutaneous, 5 X Daily  MELATONIN 5 MG PO TAB (Cabinet Override), , , NOW  pantoprazole DR (PROTONIX) tablet 40 mg, 40 mg, Oral, BID(11-21)  potassium chloride SR (K-DUR) tablet 40 mEq, 40 mEq, Oral, ONCE  rosuvastatin (CRESTOR) tablet 10 mg, 10 mg, Oral, QDAY  senna (SENOKOT) tablet 1 tablet, 1 tablet, Oral,  BID  sertraline (ZOLOFT) tablet 50 mg, 50 mg, Oral, QDAY    Continuous Infusions:   milrinone (PRIMACOR) 20 mg/D5W 100 mL infusion 0.3 mcg/kg/min (04/15/23 0114)     PRN and Respiratory Meds:acetaminophen Q4H PRN, albuterol sulfate Q4H PRN, alteplase PRN (On Call from Rx), dextrose 50% (D50) IV PRN, melatonin QHS PRN, ondansetron (ZOFRAN) IV Q6H PRN, prochlorperazine edisylate Q6H PRN    Medications Prior to Admission   Medication Sig Dispense Refill Last Dose/Taking acetaminophen (TYLENOL) 325 mg tablet Take three tablets by mouth every 8 hours as needed for Pain. Indications: pain 40 tablet 0     acyclovir (ZOVIRAX) 400 mg tablet Take one tablet by mouth twice daily.       albuterol sulfate (PROAIR HFA) 90 mcg/actuation HFA aerosol inhaler Inhale two puffs by mouth into the lungs every 6 hours as needed for Wheezing or Shortness of Breath.       amiodarone (CORDARONE) 200 mg tablet Take one tablet by mouth daily. Indications: prevention of recurrent atrial fibrillation 90 tablet 1     apixaban (ELIQUIS) 5 mg tablet Take one tablet by mouth twice daily. Indications: afib 60 tablet 0     budesonide-formoterol HFA (SYMBICORT) 160-4.5 mcg/actuation aerosol inhaler Inhale two puffs by mouth into the lungs twice daily. Indications: bronchospasm prevention with COPD 30.6 g 0     carvediloL (COREG) 3.125 mg tablet Take one tablet by mouth twice daily with meals. Take with food.  Indications: heart failute with rEF 180 tablet 0     cholecalciferol (VITAMIN D) 1,000 units tablet Take one tablet by mouth daily.       cyanocobalamin (VITAMIN B-12) 1,000 mcg tablet Take 5,000 Units by mouth daily.       dexAMETHasone (DECADRON) 4 mg tablet Take two tablets by mouth every 7 days. Patient is taking 8mg  on Thursdays prior to chemo       empagliflozin ([name]) 25 mg tablet Take one tablet by mouth daily.       EMU OIL (BULK) MISC Use  as directed. Apply topically as needed       febuxostat (ULORIC) 40 mg tablet Take one-half tablet by mouth daily. Indications: taken on chemo days 45 tablet 0     ferrous sulfate (FEOSOL) 325 mg (65 mg iron) tablet Take one tablet by mouth daily. Take on an empty stomach at least 1 hour before or 2 hours after food.       insulin detemir U-100 (LEVEMIR FLEXTOUCH) 100 unit/mL (3 mL) injection pen Resume after speaking with your PCP or endocrinology  Indications: type 2 diabetes mellitus       milrinone lactate/D5W (MILRINONE IN D5W IV) Administer  through vein. Milrinone 80mg /216ml  0.66mcg/kg/min intravenously continuous infusion. Dosing wt 78.3 kg       nitroglycerin (NITROSTAT) 0.4 mg tablet Place one tablet under tongue every 5 minutes as needed for Chest Pain.       pantoprazole DR (PROTONIX) 40 mg tablet Take one tablet by mouth twice daily. Indications: gastritis 112 tablet 0     potassium chloride SR (K-DUR) 20 mEq tablet Resume after cardiology follow up  Indications: prevention of low potassium in the blood       rosuvastatin (CRESTOR) 20 mg tablet Take one-half tablet by mouth daily. Indications: excessive fat in the blood 90 tablet 0     sertraline (ZOLOFT) 50 mg tablet Take one tablet by mouth daily.       torsemide (DEMADEX) 20 mg tablet Resume after  cardiology follow up  Indications: accumulation of fluid resulting from chronic heart failure           Laboratory Review:   CBC w/Diff    Lab Results   Component Value Date/Time    WBC 8.90 04/14/2023 04:07 AM    RBC 2.67 (L) 04/14/2023 04:07 AM    HGB 8.2 (L) 04/14/2023 04:07 AM    HCT 23.9 (L) 04/14/2023 04:07 AM    MCV 89.6 04/14/2023 04:07 AM    MCH 30.6 04/14/2023 04:07 AM    MCHC 34.1 04/14/2023 04:07 AM    RDW 19.8 (H) 04/14/2023 04:07 AM    PLTCT 271 04/14/2023 04:07 AM    MPV 8.5 04/14/2023 04:07 AM    Lab Results   Component Value Date/Time    NEUT 62 03/31/2023 03:42 AM    ANC 7.10 (H) 03/31/2023 03:42 AM    LYMA 15 (L) 03/31/2023 03:42 AM    ALC 1.80 03/31/2023 03:42 AM    MONA 13 (H) 03/31/2023 03:42 AM    AMC 1.50 (H) 03/31/2023 03:42 AM    EOSA 8 (H) 03/31/2023 03:42 AM    AEC 0.90 (H) 03/31/2023 03:42 AM    BASA 1 03/31/2023 03:42 AM    ABC 0.10 03/31/2023 03:42 AM         Chemistry    Lab Results   Component Value Date/Time    NA 144 04/15/2023 03:47 AM    K 3.6 04/15/2023 03:47 AM    CL 112 (H) 04/15/2023 03:47 AM    CO2 19 (L) 04/15/2023 03:47 AM    GAP 13 (H) 04/15/2023 03:47 AM    BUN 37 (H) 04/15/2023 03:47 AM    CR 2.87 (H) 04/15/2023 03:47 AM    GLU 148 (H) 04/15/2023 03:47 AM    MG 2.0 04/15/2023 03:47 AM    Lab Results   Component Value Date/Time    CA 8.3 (L) 04/15/2023 03:47 AM    PO4 4.1 04/15/2023 03:47 AM    ALBUMIN 3.3 (L) 04/15/2023 03:47 AM    TOTPROT 5.1 (L) 04/15/2023 03:47 AM    ALKPHOS 102 04/15/2023 03:47 AM    AST 24 04/15/2023 03:47 AM    ALT 48 04/15/2023 03:47 AM    TOTBILI 0.8 04/15/2023 03:47 AM    GFR 16 (L) 04/15/2023 03:47 AM          Lipid Profile INR   Lab Results   Component Value Date    CHOL 107 09/16/2022    TRIG 72 09/16/2022    HDL 50 09/16/2022    LDL 45 09/16/2022    VLDL 14 09/16/2022    NONHDLCHOL 57 09/16/2022         Lab Results   Component Value Date    INR 3.0 (H) 04/11/2023          Chest X-Ray:  04/11/2023 CHF, with moderate cardiomegaly, pulmonary edema, and small   pleural effusions.      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 with PR of 226     Echocardiogram Details on 04/12/23 :     Eccentric hypertrophy of the left ventricle with severe dilatation of the left ventricular chamber  Left ventricular systolic function is moderately to severely impaired with an estimate ejection fraction of 30% and global hypokinesis  Right ventricle is moderately dilated with normal function  The left atrium is severely dilated  Pacemaker lead seen in the right atrium and right ventricle  Severe mitral valve insufficiency  Severe tricuspid valve insufficiency  No pericardial effusion  Severe pulmonary hypertension with estimated peak systolic pulmonary artery pressure of 73 mmHg     In comparison to previous echocardiogram from May 2024 the mitral insufficiency was graded as moderate to severe although on my visual comparison it appears similar (severe) to the present study          Pt examined and discussed with Dr. Marin Comment Markeya Mincy  Advanced Heart Failure and Heart Transplant Fellow   Available on Voalte

## 2023-04-15 NOTE — Progress Notes
OCCUPATIONAL THERAPY  PROGRESS NOTE      Name: Raven [name]   MRN: [id number]     DOB: 29-May-1945      Age: 77 y.o.  Admission Date: 04/11/2023     LOS: 3 days     Date of Service: 04/15/2023      Mobility  Patient Turn/Position: Self  Progressive Mobility Level: Walk in hallway  Distance Walked (feet): 110 ft (x2)  Level of Assistance: Stand by assistance  Assistive Device: Walker  Activity Limited By: Fatigue    Subjective  Significant Hospital Events: 78 year old woman admitted with HFrEF exacerbation. 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  Mental / Cognitive: Alert;Oriented;Cooperative;Follows commands  Pain: No complaint of pain  Pain level: Before activity;During activity;After activity  Pain Interventions: Patient assisted into position of comfort  Persons Present: Son  Comments: On entry/exit, pt in bed w/ alarm on and all needs within reach.    Home Living Situation  Lives With: Family (lives with her son and daughter-in-law)  Type of Home: House  Entry Stairs: 2;Rail on both sides  In-Home Stairs: 1 flight;Stair glide  Bathroom Setup: Tub/shower unit  Patient Owned Equipment: Bathing: Soil scientist: Public house manager)  Comments: Reports use of RW within home and use of electric scooter for community distances.    Prior Level of Function  Level Of Independence: Independent with ADL and household mobility with device;Assist needed for IADL  Comments: Pt reports independence w/ ADLs (dressing, showering, toileting). Pt reports his son and DIL assist w/ IADLs (cooking, cleaning, laundry).  Required Assist For: All home functioning ADL;Meal preparation;Medication/finance management;Transportation/driving  History of Falls in Past 3 Months: No    ADL's  Where Assessed: Edge of Bed  UE Dressing Assist: Minimal Assist  UE Dressing Deficits: Thread RUE;Thread LUE;Pull Around Back    ADL Mobility  Bed Mobility: Supine to Sit: Standby assist  Bed Mobility: Sit to Supine: Standby assist  Transfer Type: Sit to/from stand  Transfer: Assistance Level: To/from;Bed;Standby assist (+ hallway bench)  Transfer: Assistive Device: Roller walker  Transfer: Type of Assistance: For safety considerations  End of Activity Status: In bed;Instructed patient to request assist with mobility;Instructed patient to use call light;Nursing notified  Sitting Balance: Static sitting balance;Independent  Standing Balance: Static standing balance;2 UE support;Independent;Dynamic standing balance;Standby assist  Gait Distance: 110 feet (x2)  Gait: Assistance Level: Standby assist;Management of lines;Safety considerations  Gait: Assistive Device: Roller walker  Gait Comments: x1 seated rest break. steady gait, no LOB. no c/o pain/dizziness    Activity Tolerance  Endurance: 3/5 Tolerates 25-30 Minutes Exercise w/Multiple Rests    Cognition  Overall Cognitive Status: WFL to Adequately Complete Self Care Tasks Safely  Social Interaction: Interacts in a Spontaneous,Cooperative Manner  Orientation: Alert & Oriented x4  Attention: Awake/Alert    ROM  R UE ROM: WFL   R UE ROM Method: Active  L UE ROM: WFL   L UE ROM Method: Active  Coordination: Adequate to Complete ADLs  Grasp: Bilateral Grasp Functional for Activity    Education  Persons Educated: Patient  Barriers To Learning: None Noted  Teaching Methods: Verbal Instruction  Patient Response: Verbalized Understanding  Topics: Role of OT, Goals for Therapy  Goal Formulation: With  Patient    Assessment  Assessment: Decreased Endurance;Decreased High-Level ADLs  Prognosis: Good;w/ Family  Goal Formulation: Patient    AM-PAC 6 Clicks Daily Activity Inpatient  Putting on and taking off regular lower body clothes: A Little  Bathing (Including washing, rinsing, drying): A Little  Toileting, which includes using toilet, bedpan, or urinal: A Little  Putting on and taking off regular upper body clothing: A Little  Taking care of personal grooming such as brushing teeth: None  Eating meals: None  Daily Activity Raw Score: 20  Standardized (T-scale) Score: 42.03    Plan  Progress: Progressing Toward Goals  OT Frequency: 5x/week  OT Plan for Next Visit: stairs; standing ADLs at sink    ADL Goals  Patient Will Perform Grooming: Standing at Sink;w/ Stand By Assist  Patient Will Perform Toileting: w/ Minimum Assist    Functional Transfer Goals  Pt Will Perform All Functional Transfers: w/ Stand By Assist    OT Discharge Recommendations  Recommendation: Home with intermittent supervision/assistance  Patient Currently Requires Physical Assist With: All home functioning ADLs;Bathing;Dressing;Meal preparation;Stairs  Patient Currently Requires Supervision For: ADLs;Mobility  Patient Currently Requires Equipment: Owns what is needed    Therapist: Chari Manning, OTD, OTR/L  Date: 04/15/2023

## 2023-04-15 NOTE — Case Management (ED)
Case Management Progress Note    NAME:Raven [name]                          MRN: [id number]              DOB:1945/08/13          AGE: 77 y.o.  ADMISSION DATE: 04/11/2023             DAYS ADMITTED: LOS: 3 days      Today's Date: 04/15/2023    PLAN: D/c home with resumption of care [facility] and IV milrinone with [facility]    Expected Discharge Date: 04/15/2023   Is Patient Medically Stable: Yes   Are there Barriers to Discharge? no    INTERVENTION/DISPOSITION:  Discharge Planning              Discharge Planning: Home Health, Home Infusion-Enteral-TPN    NCM spoke with [facility] intake Kaitlyn to confirm patient is discharging back home today.  NCM sent signed resumption of care home health orders and AVS to agency via Epic.  NCM sent signed resumption of IV milrinone orders to [facility] via Epic this AM.  Discussed patient during Med Q huddle. Patient will discharge home today with no change in inotrope dose. Discussed earlier this week that [facility] SW would like re-teach with patient and  family at the bedside prior to discharge. Also discussed with Med Q pharmacist during huddle to have the [facility] RN double check the home pump as well.  NCM reached out to [facility] representative Aubrei to inform her patient is discharging home today and requested that [facility] RNs come to the bedside for re-teach and hookup of medication.  NCM sent AVS to [facility] via Epic.  NCM reached out to patient's bedside RN Hyejin via Voalte to communicate the plan for [facility] nurses to come by the bedside for re-teach prior to discharging home.  NCM will update the patient's bedside RN of timing of bedside teach/visit with [facility] RNs once known.    Transportation              Does the Patient Need Case Management to Arrange Discharge Transport? (ex: facility, ambulance, wheelchair/stretcher, Medicaid, cab, other): No  Will the Patient Use Family Transport?: Yes  Transportation Name, Phone and Availability #1: son  Support                 Info or Referral                 Positive SDOH Domains and Potential Barriers                   Medication Needs  Financial                 Legal                 Other                 Discharge Disposition                                                                                                                                                      Selected Continued Care - Admitted Since 04/11/2023       [facility]       Service Provider Services Address Phone Fax Patient Preferred    [facility] [address] [place] [address] [phone] --              [facility]       Service Provider Services Address Phone Fax Patient Preferred    [facility] [address], [place] [address] [phone] --                  Para March, RNCM  Available on [place]  Office: [phone]

## 2023-04-15 NOTE — Progress Notes
HC5 END OF SHIFT/PLAN OF CARE NURSING NOTE   Nursing Shift: Night Shift 1900-0700    Acute events, nursing interventions, & communication with providers:       No acute events overnight       Patient Goal(s)  Patient will Reach and maintain goal dry weight by the end of next shift.        Patient will  Verbalize readiness for discharge by discharge.   Admission Weight: Weight: 80.7 kg (178 lb)    Last 3 Weights:   Vitals:    04/13/23 0500 04/14/23 0409 04/15/23 0341   Weight: 78.3 kg (172 lb 9.6 oz) 78.3 kg (172 lb 9.6 oz) 77.6 kg (171 lb)     Weight Change: Weight trend decreasing    Intake/Output Summary (Last 24 hours) at 04/15/2023 0636  Last data filed at 04/15/2023 0341  Gross per 24 hour   Intake 492 ml   Output 525 ml   Net -33 ml     Last Bowel Movement Date: 04/14/23 (per pt report)    Fluid Restriction? Yes   Quality/Safety    Total Fall Risk Score: 13   Risk for Injury related to falls: Coagulopathies/risk for bleed  Fall Risk Category:   History of More Than One Fall Within 6 Months Before Admission: No  Elimination, Bowel and Urine: Incontinence OR urgency OR frequency  Interventions: Communicate timing of laxatives/diuretics with assistive personnel to support proactive elimination needs  Medications: On 2 or more high fall risk drugs  Interventions: Use of gait belt , Stay within arm's reach during toileting/showering (i.e., dizziness, orthostasis) , Educate patient on medication side effects, and Bed/chair alarm (i.e., change in mental status)   Patient Care Equipment: Two present  Interventions: Needs assistance with patient care equipment when ambulating and Ensure environment is free of clutter and walkways are clear from tripping hazards  Mobility: 2 - Assistance required  Interventions: Assist x1, Gait belt in use when ambulating, Utilize walker, cane, or additional walking aid for ambulation, and Ensure the use of corrective lens and/or hearing aides are in place prior to ambulation Cognition: 0 - No cognition issues  Interventions: Bed/Chair Alarm , Stay within arm's reach while patient ambulating/toileting/showering, and Increase frequency of purposeful rounding    Other safety precautions in place: N/A    Restraints:  No      Patient Education  This RN provided education to Patient today. The following education topics were reviewed:  Quality/Safety Education:   Fall risk  Medication Education:   Cardiac medication(s)  Education provided on the following medication(s): milrinone   Cardiac - Specific Education:   Heart failure Engineer, technical sales Education:   Diet/nutrition, Glucose management, and Mobility/Activity intolerance    The following teaching method(s) were used: Verbal  Response to learning: [employer]

## 2023-04-15 NOTE — Progress Notes
Pt was discharged with family.    Infusion RN was at the beside doing teaching/and verifying the home infusion pump.    Pt and family had questions regarding the milrinone gtt and specifics about how much milrnione was given through the home pump.      The family had questions as to when the pump was turned off. Son, clifton, provides care for mom at home and administers meds.      He had questions about the notes written about what led up to the hospital admission.    He stated that he was upset about the verbiage in the notes from the ED.    He had specific questions about the home pump and I tried to call our in pt pharmacy to see if they could come up to answer some questions.    They were unable to assist and said the home pump would need to be reviewed by the home infusion company.    I also gave the son the phone # to pt relations bc he would like to have some sort of resolution to his matter

## 2023-04-15 NOTE — Discharge Instructions - Pharmacy
Discharge Summary      Name: Raven [name]  Medical Record Number: [id number]        Account Number:  [id number]  Date Of Birth:  1945-12-04                         Age:  77 y.o.  Admit date:  04/11/2023                     Discharge date: 04/15/2023      Discharge Attending:  Ellwood Sayers, MD  Discharge Summary Completed By: Donzetta Sprung, MD    Service: Med Private [phone]    Reason for hospitalization:  Acute on chronic HFrEF (heart failure with reduced ejection fraction) (HCC) [I50.23]    Primary Discharge Diagnosis:   Acute on chronic HFrEF (heart failure with reduced ejection fraction) [facility])    Hospital Diagnoses:  Hospital Problems        Active Problems    * (Principal) Acute on chronic HFrEF (heart failure with reduced ejection   fraction) (HCC)    Anemia in neoplastic disease    Acute kidney injury superimposed on CKD (HCC)     Present on Admission:   Acute on chronic HFrEF (heart failure with reduced ejection fraction) (HCC)   Acute kidney injury superimposed on CKD (HCC)   Anemia in neoplastic disease        Significant Past Medical History        Anxiety  Arthritis  Asthma  Asymptomatic stenosis of right carotid artery  Back pain  Cancer of uterus (HCC)  COPD (chronic obstructive pulmonary disease) (HCC)  Coronary artery disease  Diabetes mellitus (HCC)  Dyslipidemia  Flank mass  Fluid retention  GI bleed  Heart disease  Hypertension  Other malignant neoplasm without specification of site  Sleep apnea      Comment:  Compliant w CPAP  Vision decreased    Allergies   Bortezomib, Allopurinol, and Sulfa (sulfonamide antibiotics)    Brief Hospital Course   The patient was admitted and the following issues were addressed during this hospitalization: (with pertinent details including admission exam/imaging/labs).      Tyliyah Waits is a 77 year old woman admitted with HFrEF exacerbation. 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      She presents with dyspnea on exertion, swelling.     NT-Pro-BNP on admission was 12,291, creatinine was up to 3.23/BUN 41. COVID/flu/RSV were negative. CXR showed cardiomegaly, pulmonary edema and small pleural effusions.     She was started on IV diuretics and heart failure was consulted. Of note, when verifying milrinone dosing, pharmacist noted that pump has been off since ~ noon of 04/11/23. Per son 12/18, no issues with Milrinone pump at home, no alarms or issues even while in ER.     Ms. Macauley's volume status improved with diuresis. By 12/19, her weight was 171 lbs, Cr was improving to 2.87. Cardiology recommended resuming her PTA Torsemide 20 mg daily on 04/16/23. She will need repeat labs and HF follow-up in 1 week. She will continue her PTA dose of milrinone.     Of note, she had recent admission, discharged on 12/13 for GI bleed. Confirmed she  was only taking Eliquis at discharge and did not resume ASA and Plavix, as instructed. She did have anemia during her stay with Hgb down to 6.9 on 12/17, she received 1 unit pRBC and subsequent Hgb were > 8. Her stools were brown and she had no clinical evidence of active bleeding.     Ms. Christakos was clinically improved and stable to discharge home on 04/15/23 with home health. Heart failure follow-up scheduled for 04/19/23.       Items Needing Follow Up   Pending items or areas that need to be addressed at follow up:   -labs and HF follow-up in 1 week    Pending Labs and Follow Up Radiology    Pending labs and/or radiology review at this time of discharge are listed below: Please note- any labs with collected status will not have a result; if this area is blank, there are no items for review.   Pending Labs       Order Current Status    POC GLUCOSE In process    POC GLUCOSE In process              Medications      Medication List      CHANGE how you take these medications     carvediloL 3.125 mg tablet; Commonly known as: COREG; Dose: 3.125 mg;   Take one tablet by mouth twice daily with meals. HOLD until instructed to   resume by Cardiologist.  Indications: heart failute with rEF; For: heart   failute with rEF; Refills: 0; What changed: additional instructions   torsemide 20 mg tablet; Commonly known as: DEMADEX; Dose: 20 mg; Take   one tablet by mouth daily. Indications: accumulation of fluid resulting   from chronic heart failure; For: accumulation of fluid resulting from   chronic heart failure; Quantity: 30 tablet; Refills: 0; What changed: how   much to take, how to take this, when to take this, additional instructions     CONTINUE taking these medications     acetaminophen 325 mg tablet; Commonly known as: TYLENOL; Dose: 975 mg;   Take three tablets by mouth every 8 hours as needed for Pain. Indications:   pain; For: pain; Quantity: 40 tablet; Refills: 0   acyclovir 400 mg tablet; Commonly known as: ZOVIRAX; Dose: 400 mg;   Refills: 0   albuterol sulfate 90 mcg/actuation HFA aerosol inhaler; Commonly known   as: PROAIR HFA; Dose: 2 puff; Refills: 0   amiodarone 200 mg tablet; Commonly known as: CORDARONE; Dose: 200 mg;   Take one tablet by mouth daily. Indications: prevention of recurrent   atrial fibrillation; For: prevention of recurrent atrial fibrillation;   Quantity: 90 tablet; Refills: 1   apixaban 5 mg tablet; Commonly known as: ELIQUIS; Dose: 5 mg; Take one   tablet by mouth twice daily. Indications: afib; For: afib; Quantity: 60   tablet; Refills: 0   budesonide-formoterol HFA 160-4.5 mcg/actuation aerosol inhaler;   Commonly known as: SYMBICORT; Dose: 2 puff; Inhale two puffs by mouth into   the lungs twice daily. Indications: bronchospasm prevention with COPD;   For: bronchospasm prevention with COPD; Quantity: 30.6 g; Refills: 0 dexAMETHasone 4 mg tablet; Commonly known as: DECADRON; Dose: 8 mg;   Refills: 0   EMU OIL (BULK) MISC; Refills: 0   febuxostat 40 mg tablet; Commonly known as: ULORIC; Dose: 20 mg; Take   one-half tablet by mouth daily. Indications: taken on chemo days; For:   taken on chemo  days; Quantity: 45 tablet; Refills: 0   ferrous sulfate 325 mg (65 mg iron) tablet; Commonly known as: FEOSOL;   Dose: 325 mg; Refills: 0   MILRINONE IN D5W IV; Refills: 0   nitroglycerin 0.4 mg tablet; Commonly known as: NITROSTAT; Dose: 0.4 mg;   Refills: 0   pantoprazole DR 40 mg tablet; Commonly known as: PROTONIX; Dose: 40 mg;   Take one tablet by mouth twice daily. Indications: gastritis; For:   gastritis; Quantity: 112 tablet; Refills: 0   potassium chloride SR 20 mEq tablet; Commonly known as: K-DUR; Resume   after cardiology follow up  Indications: prevention of low potassium in   the blood; For: prevention of low potassium in the blood; Refills: 0   rosuvastatin 20 mg tablet; Commonly known as: CRESTOR; Dose: 10 mg; Take   one-half tablet by mouth daily. Indications: excessive fat in the blood;   For: excessive fat in the blood; Quantity: 90 tablet; Refills: 0   sertraline 50 mg tablet; Commonly known as: ZOLOFT; Dose: 1 tablet;   Refills: 0   VITAMIN B-12 1,000 mcg tablet; Generic drug: cyanocobalamin (vitamin   B-12); Dose: 5,000 Units; Refills: 0   VITAMIN D3 1,000 units tablet; Generic drug: CHOLEcalciferoL (vitamin   D3); Dose: 1,000 Units; Refills: 0     STOP taking these medications     empagliflozin 25 mg tablet; Commonly known as: [name]   insulin detemir U-100 100 unit/mL (3 mL) injection pen; Commonly known   as: LEVEMIR FLEXTOUCH       Return Appointments and Scheduled Appointments     Scheduled appointments:      Apr 19, 2023 10:30 AM  Follow-up visit with Fredricka Bonine, APRN-NP  Cardiovascular Medicine: [facility] (CVM Exam) [address]  [address], [address]  [place] [address]  [phone] Apr 26, 2023 2:00 PM  Follow-up visit with Carlus Pavlov, APRN-NP  Gastroenterology: [facility], [facility] (Internal Medicine) [address]  [address]  [place] [address]  [phone]     May 03, 2023 9:00 AM  Office visit with Fredricka Bonine, APRN-NP  Cardiovascular Medicine: [facility] (CVM Exam) [address]  [address], [address]  [place] [address]  [phone]     Sep 16, 2023 1:30 PM  Office visit with Arneta Cliche, MD  Cardiovascular Medicine: [facility] (CVM Exam) [address]  [address]  [place] [address]  [phone]     Sep 17, 2023 2:30 PM  ICD Check with CMPB3 PACEMAKER  Cardiovascular Medicine: [facility] (CVM Procedural) [phone]  [address]  [place] [address]  [phone]     Sep 17, 2023 3:00 PM  Office visit with Smith Robert, MD  Cardiovascular Medicine: [facility] (CVM Exam) [phone] [address]  [address]  [place] [address]  [phone]   Additional appointment instructions:    A heart failure follow-up appointment has been requested for 1 week. Lab orders have been placed for you to have collected before the appointment.          Additional Discharge Appointments    A heart failure follow-up appointment has been requested for 1 week. Lab orders have been placed for you to have collected before the appointment.          Contact information for after-discharge  care                [facility]       [facility]    Phone: [phone]    Fax: [phone]    Where: [address], [place] [address]    Service: Home Health Services              [facility]       * HOME INFUSION    Phone: [phone]    Fax: [phone]    Where: [address], [place] [address]    Service: Home Infusion and Injection                  Consults, Procedures, Diagnostics, Micro, Pathology   Consults: Cardiology  Surgical Procedures & Dates: None  Significant Diagnostic Studies, Micro and Procedures: noted in brief hospital course  Significant Pathology: none                                   Discharge Disposition, Condition   Patient Disposition: Home health   Condition at Discharge: Stable    Code Status   Full Code    Patient Instructions     Activity       Activity as Tolerated   As directed      It is important to keep increasing your activity level after you leave the hospital.  Moving around can help prevent blood clots, lung infection (pneumonia) and other problems.  Gradually increasing the number of times you are up moving around will help you return to your normal activity level more quickly.  Continue to increase the number of times you are up to the chair and walking daily to return to your normal activity level. Begin to work toward your normal activity level at discharge          Diet       Cardiac Diet   As directed      Limiting unhealthy fats and cholesterol is the most important step you can take in reducing your risk for cardiovascular disease.  Unhealthy fats include saturated and trans fats.  Monitor your sodium and cholesterol intake.  Restrict your sodium to 2g (grams) or 2000mg  (milligrams) daily, and your cholesterol to 200mg  daily.    If you have questions regarding your diet at home, you may contact a dietitian at [phone].             Signs and Symptoms:   Report these signs and symptoms       Report These Signs and Symptoms   As directed      Please contact your doctor if you have any of the following symptoms: difficulty breathing or chest pain, weight gain or swelling, blood in stool or black tarry stools.        , Education:   Education       Heart Failure Information   As directed      You are at risk for readmission to the hospital because of fluid overload. There are steps you can take to lower your risk:  *Continue your current heart medications. Changes made have been made during your hospital stay.  *Remember to weigh yourself every morning, first thing after you urinate, and write down your weigh. Take this record to your doctor's appointments.  *Chart your  symptoms - fatigue, shortness of breath, swelling, etc. Immediately report any worsening.  *Remember to consume no more than 2,000mg  (milligrams) of sodium daily. Watch out for packaged, processed, canned, and restaurant foods especially.  *If any of your symptoms worsen, or if you gain more than 2 pounds in 24 hours, or 5 pounds in a week, call your cardiologist immediately. EARLY REPORTING OF THESE CHANGES IS VERY IMPORTANT.  Condition Code        , and Others Instructions:   Other Orders       Questions About Your Stay   Complete by: As directed      Discharging attending physician: Donzetta Sprung    Order comments: If you have an emergency after discharge, please dial 9-1-1.    You may contact your discharging physician up to 7 days after discharge for questions about your hospitalization, discharge instructions, or medications by calling [phone] during regular business hours (8AM-4PM) and asking to speak to the doctor listed on the discharge information.       If you are not calling during business hours, ask for the on-call doctor.    If you have new  or worsening symptoms, you may be directed to your primary care provider (PCP) for ongoing questions, an Emergency Department, or an Urgent Care Clinic for a more immediate evaluation.    For all calls or questions more than 7 days after discharge, please contact your primary care provider (PCP).    For medications after discharge: pain (opioid) medicine cannot be refilled or prescribed by calling your discharging physician.  These medications need to be filled by your primary care provider (PCP).  Regular refill requests should be directed to your primary care provider (PCP).            Additional Orders: Case Management, Supplies, Home Health     Home Health/DME                HOME HEALTH/DME  ONCE        Comments: Home Health/Durable Medical Equipment Order Details    Patient Name:  Sheccid Reano                   Medical Record Number:   [id number]    Patient Diagnosis & ICD 10 Codes: I50 Heart failure  Coronary artery disease [I25.10]   Patient Height: 157.5 cm (5' 2)   Patient Weight: 75.9 kg (167 lb 5.3 oz)     Provider Information:   MD Name: Ellwood Sayers, MD   MD Phone Number: [phone]   MD Fax Number: [phone]   MD NPI: [id number]     Agency Instructions:     Patient now stable for discharge. Please resume the following disciplines: SN, PT, OT     RN to complete general assessment, including vitals with temperature, monitor and teach patient and/or caregiver medication management and compliance, monitor and teach pain management and pain medication compliance when necessary. Monitor and teach signs and symptoms of infection, monitor and teach disease management, including signs and symptoms of diet, who and when to call, hospital readmission avoidance. Please see attached AVS for additional discharge instruction.     Home Health to setup weekly pill box for patient.     KUHI to resume IV medication:      Drug: milrinone 0.26mcg/kg/min at 78.3kg IV via continuous infusion  Administration method:  [facility] [employer] ambulatory pump       Clinical findings to support homebound status: Poor tolerance for activity   Clinical findings to support home care services: Requires instructions/administration of injections/IV therapy     I certify that this patient is under my care and that I, or a nurse practitioner or physician's assistant working with me, had a face-to-face encounter that meets the physician's face-to-face encounter requirements with this patient on 04/15/2023.     This patient is under my care, and I have initiated the establishment of the plan of care.  This patient will be followed by a physician after discharge, who will periodically review the plan of care.   Question Answer Comment   Attending Name/Contact Ellwood Sayers, MD ph: [phone] fax: [phone]   PCP Name/Contact Barbaraann Rondo,    Specialist Name/Contact cardiologist to follow IV milrinone    Home Health to Follow Specialist                             Signed:  Donzetta Sprung, MD  04/15/2023      cc:  Primary Care Physician:  Barbaraann Rondo   Verified    Referring physicians:  Barbaraann Rondo, MD   Additional provider(s):        Did we miss something? If additional records are needed, please fax a request on office letterhead to [phone]. Please include the patient's name, date of birth, fax number and type of information needed. Additional request can be made by email at [email or web address]. For general questions of information about electronic records sharing, call [phone].

## 2023-04-16 ENCOUNTER — Encounter: Admit: 2023-04-16 | Discharge: 2023-04-16 | Payer: MEDICARE

## 2023-04-16 NOTE — Telephone Encounter
Patient Discharge Date from hospital: 04/15/23  Date Call Attempted: 04/16/23  Number of Attempts: 1  Date Call Completed:  04/16/23      Two Patient Identifier complete: Yes [x]     Next Appointment    Next follow-up appointment on 04/19/2023 at 1030 with [name], APRN at our [facility] clinic.     Transportation    Does pt have transportation?  Yes [x]     No []    NA []      Home Health    [facility] [place] OFFICE Ph) [phone] Fax) [phone]   [facility] Ph) [phone] Fax) [phone]     Medications    Does pt have all medications? Yes  [x]     No []       CHANGE how you take:  carvediloL (COREG)  torsemide (DEMADEX)  STOP taking:  empagliflozin 25 mg tablet ([name])  insulin detemir U-100 100 unit/mL (3 mL) injection  pen (LEVEMIR FLEXTOUCH)    Diet    - 200 mg cholesterol, 2 G Na, 1.5 L fluid restriction   - 1600 and 2000 calories per day. This is equal to 60g (grams) of carbohydrates per meal, and 30g of carbohydrates for a bedtime snack     Is patient following prescribed diet and restrictions?  Yes [x]    No []      Scale/Weight    Does pt have a scale at home?  Yes [x]    No []     Did pt weight first thing this morning?  Yes []    No [x]      If yes, what was pt's first morning weight today?  Pt states the scale and B/P cuff was disconnected when she was admitted to the hospital and it will be connected again when her [facility] nurse arrives.     Signs and Symptoms    Pt reports the following symptoms:     No BLE edema or upper abdominal bloating/chest tightness. States her SOA is doing pretty good and is better.        Pt verbalized understanding of signs and symptoms of HF and when to contact a provider or seek immediate assistance at the ER.    Was pt given zone sheet? Yes [x]   No []     Intervention(s)    Pt educated on the importance of weighing daily first thing in the morning before dressing, before eating or drinking, and after voiding using the same scale in the same location and write results down in note pad or log. Notify us for weight gains of 3 lbs in one day or 5 lbs in one week. Notify your provider for increased SOA.  Notify your provider for swelling or increased swelling in BLE or abdominal fullness/bloating. Advised to check B/P at least once daily. Check 1-2 hours after am meds. Log results. Check B/P other times if feeling lightheaded, dizzy, or if you feel your heart rate is elevated. Document the time you checked and any symptoms you may be feeling at the time. Call 911 for sudden, severe chest/pain pressure/SOA develops. Be sure to keep your follow up appointment and bring your weight logs, B/P logs, and medication list with you to your appointment. Call us at [phone] if you have any questions.    Plan of Care    Continued education needed for heart failure symptom management and when to contact our office.

## 2023-04-18 NOTE — Progress Notes
IM on call note    The patient called with complaints of pt is retaining water.  No answer when I called back, left voicemail.    Rudi Coco, MD

## 2023-04-19 ENCOUNTER — Encounter: Admit: 2023-04-19 | Discharge: 2023-04-19 | Payer: MEDICARE

## 2023-04-19 ENCOUNTER — Emergency Department: Admit: 2023-04-19 | Discharge: 2023-04-19 | Payer: MEDICARE

## 2023-04-19 DIAGNOSIS — I509 Heart failure, unspecified: Secondary | ICD-10-CM

## 2023-04-19 DIAGNOSIS — I5043 Acute on chronic combined systolic (congestive) and diastolic (congestive) heart failure: Secondary | ICD-10-CM

## 2023-04-19 DIAGNOSIS — I5023 Acute on chronic systolic (congestive) heart failure: Secondary | ICD-10-CM

## 2023-04-19 DIAGNOSIS — E8770 Fluid overload, unspecified: Secondary | ICD-10-CM

## 2023-04-19 DIAGNOSIS — E119 Type 2 diabetes mellitus without complications: Secondary | ICD-10-CM

## 2023-04-19 LAB — ECG 12-LEAD
P AXIS: 77 degrees — ABNORMAL LOW (ref 150–400)
P-R INTERVAL: 224 ms (ref 80.0–100.0)
Q-T INTERVAL: 468 ms — ABNORMAL HIGH (ref 32.0–36.0)
QRS DURATION: 122 ms — ABNORMAL HIGH (ref 26.0–34.0)
QTC CALCULATION (BAZETT): 501 ms — ABNORMAL HIGH (ref 11.0–15.0)
R AXIS: 1 degrees (ref 7.0–11.0)
T AXIS: 2 degrees (ref 3.5–5.0)
VENTRICULAR RATE: 69 {beats}/min — ABNORMAL LOW (ref 36.0–45.0)

## 2023-04-19 LAB — CBC AND DIFF
~~LOC~~ BKR ABSOLUTE BASO COUNT: 0.1 10*3/uL (ref 0.00–0.20)
~~LOC~~ BKR ABSOLUTE EOS COUNT: 0.9 10*3/uL — ABNORMAL HIGH (ref 0.00–0.45)
~~LOC~~ BKR ABSOLUTE LYMPH COUNT: 1.4 10*3/uL — ABNORMAL LOW (ref 1.00–4.80)
~~LOC~~ BKR ABSOLUTE MONO COUNT: 1 10*3/uL — ABNORMAL HIGH (ref 0.00–0.80)
~~LOC~~ BKR ABSOLUTE NEUTROPHIL: 5.4 10*3/uL (ref 1.80–7.00)
~~LOC~~ BKR BASOPHILS %: 2 % (ref 0–2)
~~LOC~~ BKR EOSINOPHILS %: 10 % — ABNORMAL HIGH (ref 0–5)
~~LOC~~ BKR LYMPHOCYTES %: 16 % — ABNORMAL LOW (ref 24–44)
~~LOC~~ BKR MDW (MONOCYTE DISTRIBUTION WIDTH): 24 — ABNORMAL HIGH (ref <=20.6)
~~LOC~~ BKR MONOCYTES %: 11 % (ref 4–12)
~~LOC~~ BKR RBC COUNT: 3.1 10*6/uL — ABNORMAL LOW (ref 4.00–5.00)
~~LOC~~ BKR WBC COUNT: 8.8 10*3/uL (ref 4.50–11.00)

## 2023-04-19 LAB — COMPREHENSIVE METABOLIC PANEL: ~~LOC~~ BKR POTASSIUM: 4 mmol/L — ABNORMAL LOW (ref 3.5–5.1)

## 2023-04-19 LAB — POC GLUCOSE: ~~LOC~~ BKR POC GLUCOSE: 116 mg/dL — ABNORMAL HIGH (ref 70–100)

## 2023-04-19 LAB — HIGH SENSITIVITY TROPONIN I 2 HOUR
~~LOC~~ BKR HIGH SENSITIVITY TROPONIN I 2 HOUR: 24 ng/L — ABNORMAL HIGH (ref 1.00–<15)
~~LOC~~ BKR HIGH SENSITIVITY TROPONIN I DELTA VALUE: -2 10*3/uL (ref 0.00–0.80)

## 2023-04-19 LAB — NT-PRO-BNP: ~~LOC~~ BKR NT-PRO-BNP: 116 pg/mL — ABNORMAL HIGH (ref <450)

## 2023-04-19 MED ORDER — PANTOPRAZOLE 40 MG PO TBEC
40 mg | Freq: Two times a day (BID) | ORAL | 0 refills | Status: DC
Start: 2023-04-19 — End: 2023-04-24
  Administered 2023-04-20 – 2023-04-23 (×8): 40 mg via ORAL

## 2023-04-19 MED ORDER — ROSUVASTATIN 5 MG PO TAB
10 mg | Freq: Every day | ORAL | 0 refills | Status: DC
Start: 2023-04-19 — End: 2023-04-24
  Administered 2023-04-19 – 2023-04-23 (×5): 10 mg via ORAL

## 2023-04-19 MED ORDER — ACETAMINOPHEN 500 MG PO TAB
1000 mg | ORAL | 0 refills | Status: DC | PRN
Start: 2023-04-19 — End: 2023-04-24

## 2023-04-19 MED ORDER — APIXABAN 5 MG PO TAB
5 mg | Freq: Two times a day (BID) | ORAL | 0 refills | Status: DC
Start: 2023-04-19 — End: 2023-04-24
  Administered 2023-04-20 – 2023-04-23 (×8): 5 mg via ORAL

## 2023-04-19 MED ORDER — FERROUS SULFATE 325 MG (65 MG IRON) PO TAB
325 mg | Freq: Every day | ORAL | 0 refills | Status: DC
Start: 2023-04-19 — End: 2023-04-24
  Administered 2023-04-19 – 2023-04-23 (×5): 325 mg via ORAL

## 2023-04-19 MED ORDER — DEXTROSE 50 % IN WATER (D50W) IV SYRG
12.5-25 g | INTRAVENOUS | 0 refills | Status: DC | PRN
Start: 2023-04-19 — End: 2023-04-24

## 2023-04-19 MED ORDER — BUDESONIDE-FORMOTEROL 160-4.5 MCG/ACTUATION IN HFAA
2 | Freq: Every day | RESPIRATORY_TRACT | 0 refills | Status: DC
Start: 2023-04-19 — End: 2023-04-19

## 2023-04-19 MED ORDER — ONDANSETRON 4 MG PO TBDI
4 mg | ORAL | 0 refills | Status: DC | PRN
Start: 2023-04-19 — End: 2023-04-24

## 2023-04-19 MED ORDER — FUROSEMIDE 10 MG/ML IJ SOLN
60 mg | Freq: Two times a day (BID) | INTRAVENOUS | 0 refills | Status: DC
Start: 2023-04-19 — End: 2023-04-23
  Administered 2023-04-19 – 2023-04-23 (×8): 60 mg via INTRAVENOUS

## 2023-04-19 MED ORDER — SENNOSIDES-DOCUSATE SODIUM 8.6-50 MG PO TAB
1 | Freq: Every day | ORAL | 0 refills | Status: DC | PRN
Start: 2023-04-19 — End: 2023-04-24
  Administered 2023-04-20 (×2): 1 via ORAL

## 2023-04-19 MED ORDER — POLYETHYLENE GLYCOL 3350 17 GRAM PO PWPK
1 | Freq: Every day | ORAL | 0 refills | Status: DC | PRN
Start: 2023-04-19 — End: 2023-04-24

## 2023-04-19 MED ORDER — ALBUTEROL SULFATE 90 MCG/ACTUATION IN HFAA
2 | RESPIRATORY_TRACT | 0 refills | Status: DC | PRN
Start: 2023-04-19 — End: 2023-04-24

## 2023-04-19 MED ORDER — NITROGLYCERIN 0.4 MG SL SUBL
.4 mg | SUBLINGUAL | 0 refills | Status: DC | PRN
Start: 2023-04-19 — End: 2023-04-24

## 2023-04-19 MED ORDER — CYANOCOBALAMIN (VITAMIN B-12) 500 MCG PO TAB
1000 ug | Freq: Every day | ORAL | 0 refills | Status: DC
Start: 2023-04-19 — End: 2023-04-24
  Administered 2023-04-19 – 2023-04-23 (×5): 1000 ug via ORAL

## 2023-04-19 MED ORDER — ONDANSETRON HCL (PF) 4 MG/2 ML IJ SOLN
4 mg | Freq: Once | INTRAVENOUS | 0 refills | Status: CP
Start: 2023-04-19 — End: ?
  Administered 2023-04-19: 18:00:00 4 mg via INTRAVENOUS

## 2023-04-19 MED ORDER — ACYCLOVIR 400 MG PO TAB
400 mg | Freq: Two times a day (BID) | ORAL | 0 refills | Status: CN
Start: 2023-04-19 — End: ?

## 2023-04-19 MED ORDER — CHOLECALCIFEROL (VITAMIN D3) 25 MCG (1,000 UNIT) PO TAB
1000 [IU] | Freq: Every day | ORAL | 0 refills | Status: DC
Start: 2023-04-19 — End: 2023-04-24
  Administered 2023-04-19 – 2023-04-23 (×5): 1000 [IU] via ORAL

## 2023-04-19 MED ORDER — SERTRALINE 50 MG PO TAB
50 mg | Freq: Every day | ORAL | 0 refills | Status: DC
Start: 2023-04-19 — End: 2023-04-24
  Administered 2023-04-19 – 2023-04-23 (×5): 50 mg via ORAL

## 2023-04-19 MED ORDER — INSULIN ASPART 100 UNIT/ML SC FLEXPEN
0-6 [IU] | Freq: Before meals | SUBCUTANEOUS | 0 refills | Status: DC
Start: 2023-04-19 — End: 2023-04-24
  Administered 2023-04-21: 02:00:00 1 [IU] via SUBCUTANEOUS

## 2023-04-19 MED ORDER — AMIODARONE 200 MG PO TAB
200 mg | Freq: Every day | ORAL | 0 refills | Status: DC
Start: 2023-04-19 — End: 2023-04-24
  Administered 2023-04-19 – 2023-04-23 (×5): 200 mg via ORAL

## 2023-04-19 MED ORDER — ONDANSETRON HCL (PF) 4 MG/2 ML IJ SOLN
4 mg | Freq: Once | INTRAVENOUS | 0 refills | Status: CP
Start: 2023-04-19 — End: ?
  Administered 2023-04-19: 19:00:00 4 mg via INTRAVENOUS

## 2023-04-19 MED ORDER — BUDESONIDE-FORMOTEROL 160-4.5 MCG/ACTUATION IN HFAA
2 | Freq: Two times a day (BID) | RESPIRATORY_TRACT | 0 refills | Status: DC
Start: 2023-04-19 — End: 2023-04-24
  Administered 2023-04-20 – 2023-04-22 (×3): 2 via RESPIRATORY_TRACT

## 2023-04-19 MED ORDER — ONDANSETRON HCL (PF) 4 MG/2 ML IJ SOLN
4 mg | INTRAVENOUS | 0 refills | Status: DC | PRN
Start: 2023-04-19 — End: 2023-04-24
  Administered 2023-04-20: 15:00:00 4 mg via INTRAVENOUS

## 2023-04-19 MED ORDER — MELATONIN 5 MG PO TAB
5 mg | Freq: Every evening | ORAL | 0 refills | Status: DC | PRN
Start: 2023-04-19 — End: 2023-04-24
  Administered 2023-04-20 – 2023-04-23 (×4): 5 mg via ORAL

## 2023-04-19 MED ORDER — MILRINONE IN 5 % DEXTROSE 20 MG/100 ML (200 MCG/ML) IV PGBK
.3 ug/kg/min | INTRAVENOUS | 0 refills | Status: DC
Start: 2023-04-19 — End: 2023-04-24
  Administered 2023-04-19 – 2023-04-23 (×7): 0.3 ug/kg/min via INTRAVENOUS

## 2023-04-19 NOTE — Consults
 Heart Failure Progress Note    NAME:Raven [name]             MRN: [id number]                 DOB:1946/01/29          AGE: 77 y.o.  ADMISSION DATE: 04/19/2023             DAYS ADMITTED: LOS: 0 days      Active Problems:    * No active hospital problems. *          Reason for Consultation:  Evaluation and recommendations re: Heart failure and management of diuretics        History of Present Illness: Raven [name] is a 77 y.o. 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 She was recently admitted and discharged from 04/02/2023 till 04/09/23 for GI bleed and required 2 units of pRBC. Her home torsemide was held due to bump in her creatinine with plans to follow up with Cardiology outpatient.    Patient was recently admitted from 04/11/2023 till 04/15/2023 as a case of heart failure exacerbation and was found that her milrinone pump was not working and was resumed and then was adequately diuresed and was sent home on torsemide 20 mg daily .     She presented to the heart failure clinic as a posthospital discharge follow-up and was found to be dyspneic and having fluid overload for which she was told to go to the ER to be admitted.  She reports that she has gained about 10 pounds over the last 2 days associated with shortness of breath especially when she lays flat and lightheadedness.  Patient stated that she was on torsemide 20 mg twice daily prior to her recent hospitalization for which was held due to worsening kidney function and was sent recently torsemide 20 mg p.o. daily. Patient has not been taking her Coreg since last 2 hospitalization as she is supposed to see cardiology as outpatient to resume it.  Her NT proBNP on admission was 11,636 with chest x-ray shows Mild bibasilar   atelectasis with small effusions.  She will be admitted for IV diuresis    Recommendations:  Diuretics: Will start Lasix 60 mg IV twice daily  GDMT: Continue to hold home coreg until she sees cardiology out patient., currently other GDMT is not an option due to her kidney function.   Continue home milrinone at 0.3  Standing daily weight as well as strict I&Os   Thank you for the consult, HF team will continue to follow      Ongoing:  BMP once a day. Magnesium level daily.  Keep Potassium greater than 4.0 and Magnesium greater than 2.0.  2000mg  sodium dietary restriction.  Fluid Restriction:1.5  Strict I/O. Goal output: net neg 2L/24 hour  Daily standing scale weight. Goal Dry Weight: TBD    Please place a specimen in lab NT-proBNP order on day of discharge in effort to establish baseline value correlating with suspected euvolemia.      Primary team responsible for placing Post-Discharge Health System Appointment Request order set for Cardiology: Heart Failure  appointment request within 24-48 hours of discharge. (Please do not place order any earlier in effort to reduce possible need for cancellation and rescheduling of visit).      Objective:                       Vital Signs:  Last Filed                Vital Signs: 24 Hour Range   BP: 109/69 (12/23 1200)  Temp: 35.7 ?C (96.3 ?F) (12/23 1143)  Pulse: 69 (12/23 1200)  Respirations: 22 PER MINUTE (12/23 1200)  SpO2: 93 % (12/23 1200)  Height: 157.5 cm (5' 2) (12/23 1021)  BP: (107-122)/(65-76)   Temp:  [35.7 ?C (96.3 ?F)]   Pulse:  [69-70]   Respirations:  [20 PER MINUTE-22 PER MINUTE]   SpO2:  [92 %-96 %]            Wt Readings from Last 10 Encounters:   04/19/23 80.6 kg (177 lb 9.6 oz)   04/15/23 77.6 kg (171 lb)   04/06/23 75.9 kg (167 lb 5.3 oz)   03/26/23 74.2 kg (163 lb 9.3 oz)   03/19/23 77 kg (169 lb 12.8 oz)   02/16/23 75.3 kg (166 lb)   02/15/23 75.3 kg (166 lb)   01/04/23 75.2 kg (165 lb 12.8 oz)   12/14/22 74.6 kg (164 lb 6.4 oz)   11/30/22 79.2 kg (174 lb 9.6 oz)       Physical Exam:    General Appearance: no distress, overweight, slightly dyspneic  Neck Veins: JVP is elevated 12 but patient also has severe TR, HJR positive  Auscultation: breathing comfortably, lungs clear to auscultation, no rales or rhonchi, no wheezing  Cardiac Auscultation: Regular rhythm, S1, S2, no S3 or S4, pan systolic murmur  Lower Extremity Edema: 2+ bilateral LL edema    Abdominal Exam: soft, non-tender, bowel sounds normal  Orientation: clear historian, good insight      Assessment:   Acute Exacerbation systolic and diastolic HFrEF,  EF: 30%.   Major Complications or Comorbidities [facility]): acute respiratory failure and acute/ acute on chronic systolic and/or diastolic heart failure  NYHA functional class IV (unable to carry on any physical activity without symptoms of HF, or symptoms of HF at rest),   ACC Stage D (refractory HF requiring specialized interventions).   She presents with signs of hypervolemia with Left  ventricular failure with signs of low flow state.    BNP Labs:   Lab Results   Component Value Date    NTPROBNP 11,636 (H) 04/19/2023    NTPROBNP 11,324 (H) 04/12/2023    NTPROBNP 12,291 (H) 04/11/2023       High Sensitivity Troponin:  Lab Results   Component Value Date    HSTROP0HR 26 (H) 04/19/2023    HSTROP2HR 24 (H) 04/19/2023    HSTROP4HR 28 (H) 04/12/2023    HSTROPDELT4 -7 04/12/2023    HSTROPDELTA -2 04/19/2023    HIGHSTROPI 28 (H) 04/12/2023         Admission Weight: 80.6 kg (177 lb 9.6 oz)        Most recent weights (inpatient):   Vitals:    04/19/23 1021   Weight: 80.6 kg (177 lb 9.6 oz)        I/O:     No intake or output data in the 24 hours ending 04/19/23 1342    Net IO Since Admission: No IO data has been entered for this  period [04/19/23 1342]      PLAN:  Diuretic Therapy    Prior to admission dose    Torsemide 20 mg daily    Given on admission    Lasix 60 mg IV once    Daily Dosing Lasix 60 mg IV twice daily       Guideline Directed Medical Therapy PTA Inpatient Changes   ACEI/ARB No  Renal dysfunction    ARNI No Renal dysfunction    BB Yes, Coreg 3.125 mg BID  Held on 11/26   SGLT-2 Inhibitor Hx of UTI and low GFR     Mineralocorticoid Receptor Antagonist No Renal dysfunction    Hydralazine/Nitrate No Hypotension    Ivabradine No; Hypotension    Heart Rhythm Management Therapy Yes (CRT-D)    Anticoagulation for Afib/flutter apixaban (Eliquis) 5 mg BID    Cardiac Rehab Evaluation for LVEF < or equal to 40% Yes; Date ordered: 04/12/2023    7-Day post hospital follow up scheduled within 24-48 hours of discharge No Discharge not expected within the next 24-48 hours        Most Recent Cardiac Testing/Procedures      # Anemia of chronic illness  - received a unit of blood on 04/13/23     #CAD S/P PCI and CABG   - trop 26-24    # AKI on CKD  with baseline 2.0 - 2.2 most likely cardiorenal  - Cr 3.21 on admission  -Avoid any nephrotoxic medication    #AFIB  - continue eliquis 5 mg BID   - on home amiodarone 200 mg daily       #CRTD in situ    # hypokalemia   - replaced     Past Medical History:    Anxiety    Arthritis    Asthma    Asymptomatic stenosis of right carotid artery    Back pain    Cancer of uterus (HCC)    COPD (chronic obstructive pulmonary disease) (HCC)    Coronary artery disease    Diabetes mellitus (HCC)    Dyslipidemia    Flank mass    Fluid retention    GI bleed    Heart disease    Hypertension    Other malignant neoplasm without specification of site    Sleep apnea    Vision decreased     Surgical History:   Procedure Laterality Date    ROBOT ASSISTED LOW ANTERIOR RESECTION N/A 08/10/2022    Performed by Benetta Spar, MD at [facility] OR    ESOPHAGOGASTRODUODENOSCOPY WITH BIOPSY - FLEXIBLE N/A 09/17/2022    Performed by Tempie Hoist, DO at [facility] ENDO CATHETERIZATION RIGHT HEART N/A 09/18/2022    Performed by Cath, Physician at [facility] CATH LAB    REMOVAL AND REPLACEMENT IMPLANTABLE DEFIBRILLATOR GENERATOR - SINGLE LEAD SYSTEM Left 11/04/2022    Performed by Kathreen Cornfield, MD at [facility] EP LAB    INSERTION/ REPLACEMENT PERMANENT PACEMAKER WITH ATRIAL LEAD Left 11/04/2022    Performed by Kathreen Cornfield, MD at [facility] EP LAB    Injection Venography Extremity Left 11/04/2022    Performed by Kathreen Cornfield, MD at [facility] EP LAB    ESOPHAGOGASTRODUODENOSCOPY WITH BIOPSY - FLEXIBLE N/A 03/29/2023    Performed by Buckles, Vinnie Level, MD at [facility] ENDO    ESOPHAGOGASTRODUODENOSCOPY WITH SNARE REMOVAL TUMOR/ POLYP/ OTHER LESION - FLEXIBLE N/A 03/29/2023    Performed by Buckles, Vinnie Level, MD at [facility] ENDO    ESOPHAGOGASTRODUODENOSCOPY WITH  CONTROL OF BLEEDING - FLEXIBLE N/A 04/04/2023    Performed by Buckles, Vinnie Level, MD at [facility] ENDO    BONE MARROW BIOPSY      CARDIAC DEFIBRILLATOR PLACEMENT      [employer]    CARDIAC SURGERY      CABG 3 V    CAROTID ENDARDECTOMY Right     COLONOSCOPY      HX CORONARY STENT PLACEMENT      HX HEART CATHETERIZATION      HX HYSTERECTOMY      TUNNELED VENOUS PORT PLACEMENT Right     Chest     Family History   Problem Relation Name Age of Onset    Cancer Mother      Diabetes Mother      Hypertension Mother      Cancer-Breast Sister      Cancer-Ovarian Sister      Cancer Sister      Diabetes Sister      Heart Disease Sister      Hypertension Sister      Heart Disease Brother      Hypertension Brother      Stroke Brother       Social History     Socioeconomic History    Marital status: Widowed   Tobacco Use    Smoking status: Former     Current packs/day: 0.00     Average packs/day: 1 pack/day for 20.0 years (20.0 ttl pk-yrs)     Types: Cigarettes     Start date: 62     Quit date: 2010     Years since quitting: 14.9     Passive exposure: Never    Smokeless tobacco: Never   Vaping Use    Vaping status: Never Used   Substance and Sexual Activity    Alcohol use: Not Currently    Drug use: Not Currently        Allergies:   Allergies   Allergen Reactions    Bortezomib RASH     Noted in Provider note 8/22 cancer treatment drug    Allopurinol HIVES    Sulfa (Sulfonamide Antibiotics) NAUSEA ONLY        Medications:  Scheduled Meds:  Continuous Infusions:      PRN and Respiratory Meds:    (Not in a hospital admission)       Laboratory Review:   CBC w/Diff    Lab Results   Component Value Date/Time    WBC 8.80 04/19/2023 10:32 AM    RBC 3.19 (L) 04/19/2023 10:32 AM    HGB 9.6 (L) 04/19/2023 10:32 AM    HCT 29.0 (L) 04/19/2023 10:32 AM    MCV 90.9 04/19/2023 10:32 AM    MCH 30.2 04/19/2023 10:32 AM    MCHC 33.2 04/19/2023 10:32 AM    RDW 21.6 (H) 04/19/2023 10:32 AM    PLTCT 320 04/19/2023 10:32 AM    MPV 8.7 04/19/2023 10:32 AM    Lab Results   Component Value Date/Time    NEUT 61 04/19/2023 10:32 AM    ANC 5.40 04/19/2023 10:32 AM    LYMA 16 (L) 04/19/2023 10:32 AM    ALC 1.40 04/19/2023 10:32 AM    MONA 11 04/19/2023 10:32 AM    AMC 1.00 (H) 04/19/2023 10:32 AM    EOSA 10 (H) 04/19/2023 10:32 AM    AEC 0.90 (H) 04/19/2023 10:32 AM    BASA 2 04/19/2023 10:32 AM    ABC 0.10 04/19/2023 10:32 AM  Chemistry    Lab Results   Component Value Date/Time    NA 143 04/19/2023 10:32 AM    K 4.0 04/19/2023 10:32 AM    CL 110 04/19/2023 10:32 AM    CO2 21 04/19/2023 10:32 AM    GAP 12 04/19/2023 10:32 AM    BUN 38 (H) 04/19/2023 10:32 AM    CR 3.21 (H) 04/19/2023 10:32 AM    GLU 76 04/19/2023 10:32 AM    MG 2.0 04/19/2023 10:32 AM    Lab Results   Component Value Date/Time    CA 8.7 04/19/2023 10:32 AM    PO4 4.1 04/15/2023 03:47 AM    ALBUMIN 3.7 04/19/2023 10:32 AM    TOTPROT 5.6 (L) 04/19/2023 10:32 AM    ALKPHOS 102 04/19/2023 10:32 AM    AST 21 04/19/2023 10:32 AM    ALT 26 04/19/2023 10:32 AM    TOTBILI 0.9 04/19/2023 10:32 AM    GFR 14 (L) 04/19/2023 10:32 AM          Lipid Profile INR   Lab Results   Component Value Date    CHOL 107 09/16/2022    TRIG 72 09/16/2022    HDL 50 09/16/2022    LDL 45 09/16/2022    VLDL 14 09/16/2022    NONHDLCHOL 57 09/16/2022         Lab Results   Component Value Date    INR 3.0 (H) 04/11/2023          Chest X-Ray :  Moderate cardiomegaly with improved findings of CHF. Mild bibasilar   atelectasis with small effusions.      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 with PR of 224    Echocardiogram Details on 04/12/23 :     Eccentric hypertrophy of the left ventricle with severe dilatation of the left ventricular chamber  Left ventricular systolic function is moderately to severely impaired with an estimate ejection fraction of 30% and global hypokinesis  Right ventricle is moderately dilated with normal function  The left atrium is severely dilated  Pacemaker lead seen in the right atrium and right ventricle  Severe mitral valve insufficiency  Severe tricuspid valve insufficiency  No pericardial effusion  Severe pulmonary hypertension with estimated peak systolic pulmonary artery pressure of 73 mmHg     In comparison to previous echocardiogram from May 2024 the mitral insufficiency was graded as moderate to severe although on my visual comparison it appears similar (severe) to the present study          Pt examined and discussed with Dr. Derenda Fennel Savio Albrecht  Advanced Heart Failure and Heart Transplant Fellow   Available on Voalte

## 2023-04-19 NOTE — Progress Notes
 Home Infusion Transfer Summary - to Receiving Agency:  [facility]     Learned on 04/19/23 that patient was hospitalized on 04/19/23 for fluid retention.     [facility] has been providing the following treatment to the patient:     Drug:  milrinone 0.3 mcg/kg/min at 78.3 kg IV via continuous infusion        Administration method:  CADD [employer] ambulatory pump  IV Access:  SL CVC/SL Port  Last dressing change:  n/a  Labs last drawn:  04/15/23 during previous admission  Nursing provided by:  [facility]; Ph: [phone]  Last visit:  unknown     Progress toward goals: Patient has been on service with [facility] for home inotrope since May 2024. Patient recently discharged from [facility] on 04/15/23. Milrinone infuses via CVC and port is utilized for lab draws by [facility] RN.      Community Resources:  home health listed above     When the patient is prepared to return home, Emogene Morgan will be able to resume care once we receive physician orders specific to the care that is to resume.            [facility] - Phone: [phone]; Fax: [phone]

## 2023-04-19 NOTE — Progress Notes
 RT Adult Assessment Note    NAME:Raven [name]             MRN: [id number]             DOB:1945/12/30          AGE: 77 y.o.  ADMISSION DATE: 04/19/2023             DAYS ADMITTED: LOS: 0 days    Additional Comments:  Impressions of the patient: stable resting RA, known OSA, wears CPAP, Symbicort Qday  Intervention(s)/outcome(s): IS at bedside, educated pt to do IS 5-10 times per hr  Patient education that was completed: na  Recommendations to the care team: na    Vital Signs:  Pulse: 75  RR: 18 PER MINUTE  SpO2: 95 %  O2 Device: None (Room air)  Liter Flow:    O2%:      Breath Sounds:   Right Base Breath Sounds: Decreased  Left Base Breath Sounds: Decreased  All Breath Sounds: Decreased  Respiratory Effort:   Respiratory WDL: Within Defined Limits  Respiratory Effort/Pattern: Unlabored  Comments:

## 2023-04-19 NOTE — Progress Notes
Patient showed up to office visit early, accompanied by her son.  She reports I have to get rid of this fluid she feels like she is retaining fluid since her diuretics were reduced after recent hospital stay.  She was seen in the waiting room, is slightly dyspneic and pale.  I think she needs to go to the emergency room.  I discussed this with her son, he will plan to drive her to [place] by POV.

## 2023-04-19 NOTE — ED Provider Notes
 Raven [name] is a 77 y.o. female.    Chief Complaint:  Chief Complaint   Patient presents with    Fluid Overload       History of Present Illness:   77 year old woman admitted with HFrEF exacerbation. She has cardiogenic shock on milrinone, CRTD-in situ, ICM, cap s/p CABG (LIMA,RSV- EVH) '10, hx PCI '10, severe mitral valve regurgitation, carotid artery disease s/p endarterectomy, COPD, CKD stage 4, H/O multiple myeloma, H/O endometrial cancer s/p hysterectomy, and diverticulosis s/p sigmoid colon mass resection presents with shortness of breath which has been increasing since her hospitalization.  She has noted a 10 pound weight gain.  She feels nauseous and has been belching overnight.   these symptoms are consistent with her previous heart failure exacerbation.  She reports  compliance with all medications.  She is denying chest pain or abdominal pain.          Review of Systems:  Review of Systems   Constitutional:  Positive for fatigue. Negative for fever.   HENT:  Negative for sore throat.    Eyes:  Negative for visual disturbance.   Respiratory:  Positive for cough and shortness of breath.    Cardiovascular:  Positive for leg swelling. Negative for chest pain.   Gastrointestinal:  Negative for abdominal pain.   Genitourinary:  Negative for dysuria.   Musculoskeletal:  Negative for back pain.   Skin:  Negative for rash.   Neurological:  Negative for headaches.       Allergies:  Bortezomib, Allopurinol, and Sulfa (sulfonamide antibiotics)    Past Medical History:  Past Medical History:    Anxiety    Arthritis    Asthma    Asymptomatic stenosis of right carotid artery    Back pain    Cancer of uterus (HCC)    COPD (chronic obstructive pulmonary disease) (HCC)    Coronary artery disease    Diabetes mellitus (HCC)    Dyslipidemia    Flank mass    Fluid retention    GI bleed    Heart disease    Hypertension    Other malignant neoplasm without specification of site    Sleep apnea    Vision decreased Past Surgical History:  Surgical History:   Procedure Laterality Date    ROBOT ASSISTED LOW ANTERIOR RESECTION N/A 08/10/2022    Performed by Benetta Spar, MD at [facility] OR    ESOPHAGOGASTRODUODENOSCOPY WITH BIOPSY - FLEXIBLE N/A 09/17/2022    Performed by Tempie Hoist, DO at [facility] ENDO    CATHETERIZATION RIGHT HEART N/A 09/18/2022    Performed by Cath, Physician at [facility] CATH LAB    REMOVAL AND REPLACEMENT IMPLANTABLE DEFIBRILLATOR GENERATOR - SINGLE LEAD SYSTEM Left 11/04/2022    Performed by Kathreen Cornfield, MD at [facility] EP LAB    INSERTION/ REPLACEMENT PERMANENT PACEMAKER WITH ATRIAL LEAD Left 11/04/2022    Performed by Kathreen Cornfield, MD at [facility] EP LAB    Injection Venography Extremity Left 11/04/2022    Performed by Kathreen Cornfield, MD at [facility] EP LAB    ESOPHAGOGASTRODUODENOSCOPY WITH BIOPSY - FLEXIBLE N/A 03/29/2023    Performed by Buckles, Vinnie Level, MD at [facility] ENDO    ESOPHAGOGASTRODUODENOSCOPY WITH SNARE REMOVAL TUMOR/ POLYP/ OTHER LESION - FLEXIBLE N/A 03/29/2023    Performed by Buckles, Vinnie Level, MD at [facility] ENDO    ESOPHAGOGASTRODUODENOSCOPY WITH CONTROL OF BLEEDING - FLEXIBLE N/A 04/04/2023    Performed by Buckles, Vinnie Level, MD at  [facility] ENDO    BONE MARROW BIOPSY      CARDIAC DEFIBRILLATOR PLACEMENT      [employer]    CARDIAC SURGERY      CABG 3 V    CAROTID ENDARDECTOMY Right     COLONOSCOPY      HX CORONARY STENT PLACEMENT      HX HEART CATHETERIZATION      HX HYSTERECTOMY      TUNNELED VENOUS PORT PLACEMENT Right     Chest       Pertinent medical/surgical history reviewed  Past Medical History:    Anxiety    Arthritis    Asthma    Asymptomatic stenosis of right carotid artery    Back pain    Cancer of uterus (HCC)    COPD (chronic obstructive pulmonary disease) (HCC)    Coronary artery disease    Diabetes mellitus (HCC)    Dyslipidemia    Flank mass    Fluid retention    GI bleed    Heart disease    Hypertension    Other malignant neoplasm without specification of site    Sleep apnea    Vision decreased Surgical History:   Procedure Laterality Date    ROBOT ASSISTED LOW ANTERIOR RESECTION N/A 08/10/2022    Performed by Benetta Spar, MD at [facility] OR    ESOPHAGOGASTRODUODENOSCOPY WITH BIOPSY - FLEXIBLE N/A 09/17/2022    Performed by Tempie Hoist, DO at [facility] ENDO    CATHETERIZATION RIGHT HEART N/A 09/18/2022    Performed by Cath, Physician at [facility] CATH LAB    REMOVAL AND REPLACEMENT IMPLANTABLE DEFIBRILLATOR GENERATOR - SINGLE LEAD SYSTEM Left 11/04/2022    Performed by Kathreen Cornfield, MD at [facility] EP LAB    INSERTION/ REPLACEMENT PERMANENT PACEMAKER WITH ATRIAL LEAD Left 11/04/2022    Performed by Kathreen Cornfield, MD at [facility] EP LAB    Injection Venography Extremity Left 11/04/2022    Performed by Kathreen Cornfield, MD at [facility] EP LAB    ESOPHAGOGASTRODUODENOSCOPY WITH BIOPSY - FLEXIBLE N/A 03/29/2023    Performed by Buckles, Vinnie Level, MD at [facility] ENDO    ESOPHAGOGASTRODUODENOSCOPY WITH SNARE REMOVAL TUMOR/ POLYP/ OTHER LESION - FLEXIBLE N/A 03/29/2023    Performed by Buckles, Vinnie Level, MD at [facility] ENDO    ESOPHAGOGASTRODUODENOSCOPY WITH CONTROL OF BLEEDING - FLEXIBLE N/A 04/04/2023    Performed by Buckles, Vinnie Level, MD at [facility] ENDO    BONE MARROW BIOPSY      CARDIAC DEFIBRILLATOR PLACEMENT      [employer] Jude    CARDIAC SURGERY      CABG 3 V    CAROTID ENDARDECTOMY Right     COLONOSCOPY      HX CORONARY STENT PLACEMENT      HX HEART CATHETERIZATION      HX HYSTERECTOMY      TUNNELED VENOUS PORT PLACEMENT Right     Chest       Social History:  Social History     Tobacco Use    Smoking status: Former     Current packs/day: 0.00     Average packs/day: 1 pack/day for 20.0 years (20.0 ttl pk-yrs)     Types: Cigarettes     Start date: 34     Quit date: 2010     Years since quitting: 14.9     Passive exposure: Never    Smokeless tobacco: Never   Vaping Use    Vaping status: Never Used  Substance Use Topics    Alcohol use: Not Currently    Drug use: Not Currently     Social History     Substance and Sexual Activity   Drug Use Not Currently             Family History:  Family History   Problem Relation Name Age of Onset    Cancer Mother      Diabetes Mother      Hypertension Mother      Cancer-Breast Sister      Cancer-Ovarian Sister      Cancer Sister      Diabetes Sister      Heart Disease Sister      Hypertension Sister      Heart Disease Brother      Hypertension Brother      Stroke Brother         Vitals:  ED Vitals      Date and Time T BP P RR SPO2P SPO2 User   04/19/23 1430 -- 112/66 73 20 PER MINUTE -- 95 % ED   04/19/23 1400 -- 114/54 70 24 PER MINUTE -- 96 % ED   04/19/23 1353 -- 108/61 69 16 PER MINUTE -- 95 % ED   04/19/23 1230 -- 105/67 69 19 PER MINUTE -- 95 % ED   04/19/23 1200 -- 109/69 69 22 PER MINUTE -- 93 % ED   04/19/23 1143 35.7 ?C (96.3 ?F) -- -- -- -- -- TH   04/19/23 1130 -- 122/76 70 20 PER MINUTE -- 92 % ED   04/19/23 1021 -- 107/65 70 22 PER MINUTE -- 96 % ED            Physical Exam:  Physical Exam  Vitals and nursing note reviewed.   HENT:      Head: Normocephalic and atraumatic.   Eyes:      Extraocular Movements: Extraocular movements intact.      Conjunctiva/sclera: Conjunctivae normal.   Cardiovascular:      Rate and Rhythm: Normal rate and regular rhythm.   Pulmonary:      Effort: Pulmonary effort is normal.      Breath sounds: Normal breath sounds.   Abdominal:      General: There is no distension.      Palpations: Abdomen is soft.      Tenderness: There is no abdominal tenderness. There is no guarding.   Musculoskeletal:         General: Normal range of motion.      Cervical back: Neck supple.      Right lower leg: Edema present.      Left lower leg: Edema present.   Neurological:      Mental Status: She is alert and oriented to person, place, and time. Mental status is at baseline.   Psychiatric:         Mood and Affect: Mood normal.         Behavior: Behavior normal.         Laboratory Results:  Labs Reviewed   NT-PRO-BNP - Abnormal       Result Value Ref Range Status    NT-Pro-BNP 11,636 (*) <450 pg/mL Final   CBC AND DIFF - Abnormal    White Blood Cells 8.80  4.50 - 11.00 10*3/uL Final    Red Blood Cells 3.19 (*) 4.00 - 5.00 10*6/uL Final    Hemoglobin 9.6 (*) 12.0 - 15.0 g/dL Final    Hematocrit 71.6 (*)  36.0 - 45.0 % Final    MCV 90.9  80.0 - 100.0 fL Final    MCH 30.2  26.0 - 34.0 pg Final    MCHC 33.2  32.0 - 36.0 g/dL Final    RDW 16.1 (*) 09.6 - 15.0 % Final    Platelet Count 320  150 - 400 10*3/uL Final    MPV 8.7  7.0 - 11.0 fL Final    Neutrophils 61  41 - 77 % Final    Lymphocytes 16 (*) 24 - 44 % Final    Monocytes 11  4 - 12 % Final    Eosinophils 10 (*) 0 - 5 % Final    Basophils 2  0 - 2 % Final    Absolute Neutrophil Count 5.40  1.80 - 7.00 10*3/uL Final    Absolute Lymph Count 1.40  1.00 - 4.80 10*3/uL Final    Absolute Monocyte Count 1.00 (*) 0.00 - 0.80 10*3/uL Final    Absolute Eosinophil Count 0.90 (*) 0.00 - 0.45 10*3/uL Final    Absolute Basophil Count 0.10  0.00 - 0.20 10*3/uL Final    MDW (Monocyte Distribution Width) 24.0 (*) <=20.6 Final   COMPREHENSIVE METABOLIC PANEL - Abnormal    Sodium 143  137 - 147 mmol/L Final    Potassium 4.0  3.5 - 5.1 mmol/L Final    Chloride 110  98 - 110 mmol/L Final    Glucose 76  70 - 100 mg/dL Final    Blood Urea Nitrogen 38 (*) 7 - 25 mg/dL Final    Creatinine 0.45 (*) 0.40 - 1.00 mg/dL Final    Calcium 8.7  8.5 - 10.6 mg/dL Final    Total Protein 5.6 (*) 6.0 - 8.0 g/dL Final    Total Bilirubin 0.9  0.2 - 1.3 mg/dL Final    Albumin 3.7  3.5 - 5.0 g/dL Final    Alk Phosphatase 102  25 - 110 U/L Final    AST 21  7 - 40 U/L Final    ALT 26  7 - 56 U/L Final    CO2 21  21 - 30 mmol/L Final    Anion Gap 12  3 - 12 Final    Glomerular Filtration Rate (GFR) 14 (*) >60 mL/min Final   HIGH SENSITIVITY TROPONIN I 0 HOUR - Abnormal    hs Troponin I 0 Hour 26 (*) <15 ng/L Final   HIGH SENSITIVITY TROPONIN I 2 HOUR - Abnormal    hs Troponin I 2 Hour 24 (*) <15 ng/L Final    hs Troponin I 2-0hr Delta Value -2   Final   MAGNESIUM - Normal    Magnesium 2.0  1.6 - 2.6 mg/dL Final   HIGH SENSITIVITY TROPONIN PANEL    Narrative:     The following orders were created for panel order HIGH SENSITIVITY TROPONIN PANEL.  Procedure                               Abnormality         Status                     ---------                               -----------         ------  HIGH SENSITIVITY TROPON.Marland KitchenMarland Kitchen[[id number]]  Abnormal            Final result               HIGH SENSITIVITY TROPON.Marland KitchenMarland Kitchen[[id number]]  Abnormal            Final result               HIGH SENSITIVITY TROPON.Marland KitchenMarland Kitchen[[id number]]                                                   Please view results for these tests on the individual orders.   MANUAL DIFF     POC Glucose (Download): (!) 158    Radiology Interpretation:  CHEST SINGLE VIEW   Final Result         Moderate cardiomegaly with improved findings of CHF. Mild bibasilar atelectasis with small effusions.          Approved by Donnamarie Poag, MD on 04/19/2023 11:03 AM      By my electronic signature, I attest that I have personally reviewed the images for this examination and formulated the interpretations and opinions expressed in this report          Finalized by Golda Acre, M.D. on 04/19/2023 12:33 PM. Dictated by Donnamarie Poag, MD on 04/19/2023 11:00 AM.          EKG:  ECG Results              ECG 12-LEAD (Final result)        Collection Time Result Time VT RATE P-R Interval QRS DURATION Q-T Interval QTC Calc Bazett    04/19/23 10:32:34 04/19/23 10:38:03 69 224 122 468 501             Collection Time Result Time P Axis R Axis T Axis    04/19/23 10:32:34 04/19/23 10:38:03 77 1 2                     Final result                   Impression:    Sinus rhythm with 1st degree AV block with occasional premature ventricular complexes  Nonspecific intraventricular conduction delay  Nonspecific T wave abnormality  No STEMI  When compared with ECG of 11-Apr-2023 23:05,  premature ventricular complexes are now present  Nonspecific T wave abnormality no longer evident in Anterior leads  QT has lengthened  Confirmed by Gust Rung ([id number]) on 04/19/2023 10:37:59 AM                                    Medical Decision Making:  Raven [name] is a 77 y.o. female who presents with chief complaint as listed above. Based on the history and presentation, the list of differential diagnoses considered included, but was not limited to, CHF exacerbation, AKI, ACS, effusion, pneumonia    ED Course    ED Course as of 04/22/23 2327   Mon Apr 19, 2023   1211 NT-PRO-BNP(!)  BNP consistent with previous values. [SB]   1211 CHEST SINGLE VIEW  Cardiomegaly, improved CHF [SB]   1438 Mildly elevated, but improved from previous.  Patient seen by cardiology, Lasix ordered, recommend admission to medicine for further  medication management. [SB]      ED Course User Index  [SB] Fuller Song L, DO       Complexity of Problems Addressed  Patient's active diagnoses as well as contributing pre-existing medical problems include:  Clinical Impression   Acute on chronic combined systolic and diastolic congestive heart failure (HCC)   Persistent atrial fibrillation (HCC)   Type 2 diabetes mellitus without complication, unspecified whether long term insulin use (HCC)   S/P drug eluting coronary stent placement     Evaluation performed for potential threat to life or bodily function during this visit given the initial differential diagnosis and clinical impression(s) as discussed previously in MDM/ED course.    Additional data reviewed:    History was obtained from an independent historian: Family  Prior non-ED notes reviewed: Clinic note  Independent interpretation of diagnostic tests was performed by me: X-Ray: cardiomegaly, improved CHF  Patient presentation/management was discussed with the following qualified health care professionals and/or other relevant professionals: Admitting Physician and cardiology    Risk evaluation:    Diagnosis or treatment of patient condition impacted by social determinant of health: None  Tests Considered but not performed due to clinical scoring (if not mentioned in ED course, aside from what is implied by clinical scores listed):   Rationale regarding whether admission or escalation of care considered if not performed (if not mentioned in ED course, aside from what is implied by clinical scores listed):     ED Scoring:                                Facility Administered Meds:  Medications   furosemide (LASIX) injection 60 mg (60 mg Intravenous Given 04/19/23 1504)   dextrose 50% (D50) syringe 25-50 mL (has no administration in time range)   insulin aspart (U-100) (NOVOLOG FLEXPEN U-100 INSULIN) injection PEN 0-6 Units (has no administration in time range)   ondansetron HCL (PF) (ZOFRAN (PF)) injection 4 mg (4 mg Intravenous Given 04/19/23 1130)   ondansetron HCL (PF) (ZOFRAN (PF)) injection 4 mg (4 mg Intravenous Given 04/19/23 1232)       Clinical Impression:  Clinical Impression   Acute on chronic combined systolic and diastolic congestive heart failure (HCC)   Persistent atrial fibrillation (HCC)   Type 2 diabetes mellitus without complication, unspecified whether long term insulin use (HCC)   S/P drug eluting coronary stent placement       Disposition/Follow up  ED Disposition     ED Disposition   Admit           [facility] CARE  [address] [place] [address]  [phone]          Medications:  Current Discharge Medication List          Procedure Notes:  Procedures       Attestation / Supervision:  I personally performed the E/M including history, physical exam, and MDM.      Layne Benton, DO

## 2023-04-19 NOTE — H&P (View-Only)
 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 On arrival, she was hemodynamically stable and afebrile, saturating well on room air.  Her initial presentation was suggestive of being grossly fluid overloaded.  Cardiology was consulted and they recommended Lasix 60 mg IV twice daily and admission to medicine for further workup and treatment.    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 Notes that she was 175 when she weighed herself at home   -in the ED: dyspneic, normotensive, not hypoxic. Restless when trying to lay flat.   -NT-pro-BNP: 11636  -troponin 24 ->   - CXR: cardiomegaly with improved findings suggestive of CHF, small bilateral pleural effusions  -last echo May 2024: LVEF 30%, moderate-severe MR, severe TR  -received IV lasix 60mg  x1 in the ED  Plan:  - cardiology consulted, appreciate recs: lasix 60 mg IV BID (ordered), w/ goal output of at least 2L/24 hours  continue to hold off on resuming coreg   - continue PTA milrinone (0.3)   - daily standing weights (ordered)  - strict I/O   - 2000 mg daily Na restriction   - continue PT: apixiban, statin and amiodarone       AKI on CKD stage 4  Chronic metabolic acidosis  -baseline creatinine ~ 2.0-2.2  - presented with Cr of 3.2  -AKI likely secondary to cardiorenal syndrome  Plan:   - diuretics as above, will recheck with AM labs  Type 2 diabetes  -PTA jardiance and levemir. Holding  Plan:  - ACHS +LDCF       COPD  Subcentimeter nodular opacities in lungs  -CT A/P 12/6: Tree-in-bud and multiple subcentimeter nodular opacities are   indeterminate.   Plan:  -Follow-up CT chest in 6-8 weeks recommended for reevaluation.   - Continue PTA symbicort.       Chronic anemia  Multiple Myeloma  -follows [facility], on outpatient daratumumab and darbepoetin  - Continue PTA acyclovir prophylaxis.      Hx of Diverticulosis  H/O melena and hematemesis  -12/2 EGD: 1.5 cm friable polyp in duodenum s/p clip x2, no bleeding seen   -12/8 EGD with ulcer in duodenum and visible vessel, no active bleeding.  Epinephrine injection utilized at pace with thermal therapy and mechanical therapy of Endo Clip placed.  Plan:   -Hgb: 9.6 - stable/improved since her last dc   - did need a blood transfusion during her last hospitalization   - will ctm with daily CBC     Pancreatic cyst  -Noted during previous admission  -CT A/P 12/6: Mild increase in size of indeterminate large lobulated cystic mass in the tail of pancreas. Endoscopic ultrasound recommended for further evaluation.   -Plan is for EUS in next month for cystic mass in pancreas, TBD         Allergies:  Bortezomib, Allopurinol, and Sulfa (sulfonamide antibiotics)    Nutrition: cardiac Diet  Fluids and Electrolytes: Fluids PRN, Replace Electrolytes PRN  DVT PPx: PTA apixiban    Code Status: Full Code    Disposition: admit to medicine for IV diuretic therapy     Humberto Seals, MD       04/19/2023  Internal Medicine    To reach covering physician or NP 24/7, searchable directory for [facility] *Assigned Team* First Call.  Volte is the preferred electronic method of communication.     ---------------------------------------------------------------------------------------------------------------------------------------------    Total time involved in patient care was 80  minutes encompassing the following activities: Preparing to see the patient, Obtaining and/or reviewing separately obtained history, Performing a medically appropriate examination and/or evaluation, Counseling and educating the patient/family/caregiver, Ordering medications, tests or procedures, Documenting clinical information in the electronic health record, Independently interpreting results and communicating results to the patient/family/caregiver, and care coordination.     A significant portion of this note was dictated via voice recognition software.    Every attempt was made to edit. Small errors may persist.   Please contact note writer if clarification is needed.      History of Present Illness     Raven [name] is a 77 y.o. 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  She reports that her shortness of breath progressively worse since she was discharged from the hospital last week.  She notes that her baseline weight is approximately 1 60-1 62, but notes that when she weighed herself yesterday at her home scale (the same scale that she knows what her baseline weight is), that she was 175.  During these few days,  she has noted worsening swelling in her legs and worsening shortness of breath.  Her symptoms progressed to the point where she was unable to lie flat when she fell and she was able to do when she left the hospital last time.  She denies any chest pain or palpitations.  She denies any other acute concerns or complaints.  She has been taking her medications as prescribed.    Patient communicated understanding and support of plan.    No additional complaints.     A significant portion of the HPI was dictated via voice recognition software.   _____________________________________________________________________________________    Primary Care Physician: Barbaraann Rondo    Past Medical History:    Anxiety    Arthritis    Asthma    Asymptomatic stenosis of right carotid artery    Back pain    Cancer of uterus (HCC)    COPD (chronic obstructive pulmonary disease) (HCC)    Coronary artery disease    Diabetes mellitus (HCC)    Dyslipidemia    Flank mass    Fluid retention    GI bleed    Heart disease    Hypertension    Other malignant neoplasm without specification of site    Sleep apnea    Vision decreased       Surgical History:   Procedure Laterality Date    ROBOT ASSISTED LOW ANTERIOR RESECTION N/A 08/10/2022    Performed by Benetta Spar, MD at [facility] OR    ESOPHAGOGASTRODUODENOSCOPY WITH BIOPSY - FLEXIBLE N/A 09/17/2022    Performed by Tempie Hoist, DO at [facility] ENDO    CATHETERIZATION RIGHT HEART N/A 09/18/2022    Performed by Cath, Physician at [facility] CATH LAB    REMOVAL AND REPLACEMENT IMPLANTABLE DEFIBRILLATOR GENERATOR - SINGLE LEAD SYSTEM Left 11/04/2022    Performed by Kathreen Cornfield, MD at [facility] EP LAB    INSERTION/ REPLACEMENT PERMANENT PACEMAKER WITH ATRIAL LEAD Left 11/04/2022    Performed by Kathreen Cornfield, MD at [facility] EP LAB    Injection Venography Extremity Left 11/04/2022    Performed by Kathreen Cornfield, MD at [facility] EP LAB    ESOPHAGOGASTRODUODENOSCOPY WITH BIOPSY - FLEXIBLE N/A 03/29/2023    Performed by Buckles, Vinnie Level, MD at [facility] ENDO    ESOPHAGOGASTRODUODENOSCOPY WITH SNARE REMOVAL TUMOR/ POLYP/ OTHER LESION - FLEXIBLE N/A 03/29/2023    Performed by Buckles, Vinnie Level, MD at [facility] ENDO    ESOPHAGOGASTRODUODENOSCOPY WITH CONTROL OF BLEEDING - FLEXIBLE N/A 04/04/2023    Performed by Buckles, Vinnie Level, MD at [facility] ENDO    BONE MARROW BIOPSY      CARDIAC DEFIBRILLATOR PLACEMENT      [employer]    CARDIAC SURGERY      CABG 3 V    CAROTID ENDARDECTOMY Right     COLONOSCOPY      HX CORONARY STENT PLACEMENT      HX HEART CATHETERIZATION      HX HYSTERECTOMY      TUNNELED VENOUS PORT PLACEMENT Right     Chest       Social History     Socioeconomic History    Marital status: Widowed   Tobacco Use    Smoking status: Former     Current packs/day: 0.00     Average packs/day: 1 pack/day for 20.0 years (20.0 ttl pk-yrs)     Types: Cigarettes     Start date: 42     Quit date: 2010  Years since quitting: 14.9     Passive exposure: Never    Smokeless tobacco: Never   Vaping Use    Vaping status: Never Used   Substance and Sexual Activity    Alcohol use: Not Currently    Drug use: Not Currently       Family History   Problem Relation Name Age of Onset    Cancer Mother      Diabetes Mother      Hypertension Mother Cancer-Breast Sister      Cancer-Ovarian Sister      Cancer Sister      Diabetes Sister      Heart Disease Sister      Hypertension Sister      Heart Disease Brother      Hypertension Brother      Stroke Brother         Objective:       Vital Signs:  BP 112/66  - Pulse 73  - Temp (!) 35.7 ?C (96.3 ?F)  - Ht 157.5 cm (5' 2)  - Wt 80.6 kg (177 lb 9.6 oz)  - SpO2 95%  - BMI 32.48 kg/m?     Physical Exam  Vitals reviewed.   Constitutional:       General: She is not in acute distress.     Appearance: Normal appearance. She is normal weight. She is not toxic-appearing.   HENT:      Head: Normocephalic and atraumatic.      Nose: Nose normal.      Mouth/Throat:      Mouth: Mucous membranes are dry.      Pharynx: Oropharynx is clear.   Cardiovascular:      Rate and Rhythm: Normal rate and regular rhythm.      Heart sounds: No murmur heard.     No friction rub. No gallop.   Pulmonary:      Effort: Pulmonary effort is normal. No respiratory distress.      Breath sounds: Normal breath sounds. No stridor. No wheezing, rhonchi or rales.   Abdominal:      General: Abdomen is flat. Bowel sounds are normal. There is no distension.      Palpations: Abdomen is soft.      Tenderness: There is no abdominal tenderness. There is no guarding or rebound.   Musculoskeletal:         General: No swelling. Normal range of motion.      Cervical back: Normal range of motion and neck supple.      Right lower leg: Edema present.      Left lower leg: Edema present.   Skin:     General: Skin is warm and dry.   Neurological:      General: No focal deficit present.      Mental Status: She is alert and oriented to person, place, and time. Mental status is at baseline.   Psychiatric:         Mood and Affect: Mood normal.         Behavior: Behavior normal.         Thought Content: Thought content normal.         Judgment: Judgment normal.           Intake/Output Summary:  (Last 24 hours)  No intake or output data in the 24 hours ending 04/19/23 1532 Labs:  Recent Labs     04/19/23  1032   HGB 9.6*   HCT 29.0*  WBC 8.80   PLTCT 320   NA 143   K 4.0   CL 110   CO2 21   BUN 38*   CR 3.21*   GLU 76   CA 8.7   MG 2.0   ALBUMIN 3.7   TOTPROT 5.6*   TOTBILI 0.9   AST 21   ALT 26   ALKPHOS 102   Glucose: 76 (04/19/23 1032)  BNP: No results for input(s): BNP in the last 72 hours.  CARDIAC ENZYMES: No results for input(s): CKMB in the last 72 hours.      Medications:   acetaminophen (TYLENOL) 325 mg tablet Take three tablets by mouth every 8 hours as needed for Pain. Indications: pain    acyclovir (ZOVIRAX) 400 mg tablet Take one tablet by mouth twice daily.    albuterol sulfate (PROAIR HFA) 90 mcg/actuation HFA aerosol inhaler Inhale two puffs by mouth into the lungs every 6 hours as needed for Wheezing or Shortness of Breath.    amiodarone (CORDARONE) 200 mg tablet Take one tablet by mouth daily. Indications: prevention of recurrent atrial fibrillation    apixaban (ELIQUIS) 5 mg tablet Take one tablet by mouth twice daily. Indications: afib    budesonide-formoterol HFA (SYMBICORT) 160-4.5 mcg/actuation aerosol inhaler Inhale two puffs by mouth into the lungs twice daily. Indications: bronchospasm prevention with COPD    carvediloL (COREG) 3.125 mg tablet Take one tablet by mouth twice daily with meals. HOLD until instructed to resume by Cardiologist.  Indications: heart failute with rEF    cholecalciferol (VITAMIN D) 1,000 units tablet Take one tablet by mouth daily.    cyanocobalamin (VITAMIN B-12) 1,000 mcg tablet Take 5,000 Units by mouth daily.    dexAMETHasone (DECADRON) 4 mg tablet Take two tablets by mouth every 7 days. Patient is taking 8mg  on Thursdays prior to chemo    EMU OIL (BULK) MISC Use  as directed. Apply topically as needed    febuxostat (ULORIC) 40 mg tablet Take one-half tablet by mouth daily. Indications: taken on chemo days    ferrous sulfate (FEOSOL) 325 mg (65 mg iron) tablet Take one tablet by mouth daily. Take on an empty stomach at least 1 hour before or 2 hours after food.    milrinone lactate/D5W (MILRINONE IN D5W IV) Administer  through vein. Milrinone 80mg /268ml  0.26mcg/kg/min intravenously continuous infusion. Dosing wt 78.3 kg    nitroglycerin (NITROSTAT) 0.4 mg tablet Place one tablet under tongue every 5 minutes as needed for Chest Pain.    pantoprazole DR (PROTONIX) 40 mg tablet Take one tablet by mouth twice daily. Indications: gastritis    potassium chloride SR (K-DUR) 20 mEq tablet Resume after cardiology follow up  Indications: prevention of low potassium in the blood    rosuvastatin (CRESTOR) 20 mg tablet Take one-half tablet by mouth daily. Indications: excessive fat in the blood    sertraline (ZOLOFT) 50 mg tablet Take one tablet by mouth daily.    torsemide (DEMADEX) 20 mg tablet Take one tablet by mouth daily. Indications: accumulation of fluid resulting from chronic heart failure       Recent Imaging  CHEST SINGLE VIEW    Result Date: 04/19/2023  Moderate cardiomegaly with improved findings of CHF. Mild bibasilar atelectasis with small effusions.  Approved by Donnamarie Poag, MD on 04/19/2023 11:03 AM By my electronic signature, I attest that I have personally reviewed the images for this examination and formulated the interpretations and opinions expressed in this report  Finalized by  Golda Acre, M.D. on 04/19/2023 12:33 PM. Dictated by Donnamarie Poag, MD on 04/19/2023 11:00 AM.    _____________________________________________________________________________________    Noah Charon, MD  04/19/23

## 2023-04-20 LAB — BASIC METABOLIC PANEL
~~LOC~~ BKR CREATININE: 3.2 mg/dL — ABNORMAL HIGH (ref 0.40–1.00)
~~LOC~~ BKR SODIUM, SERUM: 142 mmol/L — ABNORMAL LOW (ref 137–147)

## 2023-04-20 LAB — POC GLUCOSE
~~LOC~~ BKR POC GLUCOSE: 159 mg/dL — ABNORMAL HIGH (ref 70–100)
~~LOC~~ BKR POC GLUCOSE: 129 mg/dL — ABNORMAL HIGH (ref 70–100)
~~LOC~~ BKR POC GLUCOSE: 145 mg/dL — ABNORMAL HIGH (ref 70–100)

## 2023-04-20 LAB — HEMOGLOBIN A1C: ~~LOC~~ BKR HEMOGLOBIN A1C: 6.3 % — ABNORMAL HIGH (ref 4.0–5.7)

## 2023-04-20 LAB — CBC AND DIFF
~~LOC~~ BKR RBC COUNT: 2.8 10*6/uL — ABNORMAL LOW (ref 4.00–5.00)
~~LOC~~ BKR WBC COUNT: 7.3 10*3/uL — ABNORMAL HIGH (ref 4.50–11.00)

## 2023-04-20 MED ORDER — POTASSIUM CHLORIDE 20 MEQ PO TBTQ
20 meq | Freq: Once | ORAL | 0 refills | Status: CP
Start: 2023-04-20 — End: ?
  Administered 2023-04-20: 15:00:00 20 meq via ORAL

## 2023-04-20 MED ORDER — CHLOROTHIAZIDE SODIUM 500 MG IV SOLR
500 mg | Freq: Once | INTRAVENOUS | 0 refills | Status: CP
Start: 2023-04-20 — End: ?
  Administered 2023-04-20: 15:00:00 500 mg via INTRAVENOUS

## 2023-04-20 MED ADMIN — WATER FOR INJECTION, STERILE IJ SOLN [79513]: 20 mL | INTRAVENOUS | @ 15:00:00 | Stop: 2023-04-20 | NDC 00409488723

## 2023-04-20 NOTE — Progress Notes
 Heart Failure Progress Note    NAME:Raven [name]             MRN: [id number]                 DOB:02-Oct-1945          AGE: 77 y.o.  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 She was recently admitted and discharged from 04/02/2023 till 04/09/23 for GI bleed and required 2 units of pRBC. Her home torsemide was held due to bump in her creatinine with plans to follow up with Cardiology outpatient.    Patient was recently admitted from 04/11/2023 till 04/15/2023 as a case of heart failure exacerbation and was found that her milrinone pump was not working and was resumed and then was adequately diuresed and was sent home on torsemide 20 mg daily .     She presented to the heart failure clinic as a posthospital discharge follow-up and was found to be dyspneic and having fluid overload for which she was told to go to the ER to be admitted.  She reports that she has gained about 10 pounds over the last 2 days associated with shortness of breath especially when she lays flat and lightheadedness.  Patient stated that she was on torsemide 20 mg twice daily prior to her recent hospitalization for which was held due to worsening kidney function and was sent recently torsemide 20 mg p.o. daily. Patient has not been taking her Coreg since last 2 hospitalization as she is supposed to see cardiology as outpatient to resume it.  Her NT proBNP on admission was 11,636 with chest x-ray shows Mild bibasilar   atelectasis with small effusions.  She will be admitted for IV diuresis    Recommendations:  Diuretics: Patient is not making adequate urine output.  Continue start Lasix 60 mg IV twice daily and will add Diuril 500 IV once today 12/24  GDMT: Continue to hold home coreg until she sees cardiology out patient., currently other GDMT is not an option due to her kidney function.   Continue home milrinone at 0.3  Standing daily weight as well  as strict I&Os   Thank you for the consult, HF team will continue to follow      Ongoing:  BMP once a day. Magnesium level daily.  Keep Potassium greater than 4.0 and Magnesium greater than 2.0.  2000mg  sodium dietary restriction.  Fluid Restriction:1.5  Strict I/O. Goal output: net neg 2L/24 hour  Daily standing scale weight. Goal Dry Weight: TBD    Please place a specimen in lab NT-proBNP order on day of discharge in effort to establish baseline value correlating with suspected euvolemia.      Primary team responsible for placing Post-Discharge Health System Appointment Request order set for Cardiology: Heart Failure appointment request within 24-48 hours of discharge. (Please do not place order any earlier in effort to reduce possible need for cancellation and rescheduling of visit).      Objective:                       Vital Signs:  Last Filed                Vital Signs: 24 Hour Range   BP: 105/54 (12/24 0429)  Temp: 36.4 ?C (97.6 ?F) (12/24 1478)  Pulse: 69 (12/24 0429)  Respirations: 16 PER MINUTE (12/24 0429)  SpO2: 91 % (12/24 0429)  O2%: 21 % (12/23 2231)  O2 Device: None (Room air) (12/24 0429)  Height: 157.5 cm (5' 2) (12/23 1021)  BP: (105-123)/(48-76)   Temp:  [35.7 ?C (96.3 ?F)-36.4 ?C (97.6 ?F)]   Pulse:  [69-75]   Respirations:  [16 PER MINUTE-24 PER MINUTE]   SpO2:  [91 %-97 %]   O2%:  [21 %]   O2 Device: None (Room air)           Wt Readings from Last 10 Encounters:   04/19/23 80.3 kg (177 lb 0.5 oz)   04/15/23 77.6 kg (171 lb)   04/06/23 75.9 kg (167 lb 5.3 oz)   03/26/23 74.2 kg (163 lb 9.3 oz)   03/19/23 77 kg (169 lb 12.8 oz)   02/16/23 75.3 kg (166 lb)   02/15/23 75.3 kg (166 lb)   01/04/23 75.2 kg (165 lb 12.8 oz)   12/14/22 74.6 kg (164 lb 6.4 oz)   11/30/22 79.2 kg (174 lb 9.6 oz)       Physical Exam:    General Appearance: no distress, overweight, slightly dyspneic  Neck Veins: JVP is elevated 12 but patient also has severe TR, HJR positive  Auscultation: breathing comfortably, lungs clear to auscultation, no rales or rhonchi, no wheezing  Cardiac Auscultation: Regular rhythm, S1, S2, no S3 or S4, pan systolic murmur  Lower Extremity Edema: 2+ bilateral LL edema    Abdominal Exam: soft, non-tender, bowel sounds normal  Orientation: clear historian, good insight      Assessment:   Acute Exacerbation systolic and diastolic HFrEF,  EF: 30%.   Major Complications or Comorbidities [facility]): acute respiratory failure and acute/ acute on chronic systolic and/or diastolic heart failure  NYHA functional class IV (unable to carry on any physical activity without symptoms of HF, or symptoms of HF at rest),   ACC Stage D (refractory HF requiring specialized interventions).   She presents with signs of hypervolemia with Left  ventricular failure with signs of low flow state.    BNP Labs:   Lab Results   Component Value Date    NTPROBNP 11,636 (H) 04/19/2023    NTPROBNP 11,324 (H) 04/12/2023    NTPROBNP 12,291 (  H) 04/11/2023       High Sensitivity Troponin:  Lab Results   Component Value Date    HSTROP0HR 26 (H) 04/19/2023    HSTROP2HR 24 (H) 04/19/2023    HSTROP4HR 28 (H) 04/12/2023    HSTROPDELT4 -7 04/12/2023    HSTROPDELTA -2 04/19/2023    HIGHSTROPI 28 (H) 04/12/2023         Admission Weight: 80.6 kg (177 lb 9.6 oz)        Most recent weights (inpatient):   Vitals:    04/19/23 1021 04/19/23 1630   Weight: 80.6 kg (177 lb 9.6 oz) 80.3 kg (177 lb 0.5 oz)        I/O:       Intake/Output Summary (Last 24 hours) at 04/20/2023 0709  Last data filed at 04/20/2023 0444  Gross per 24 hour   Intake 634.42 ml   Output 700 ml   Net -65.58 ml       Net IO Since Admission: -65.58 mL [04/20/23 0709]      PLAN:  Diuretic Therapy    Prior to admission dose    Torsemide 20 mg daily    Given on admission    Lasix 60 mg IV once    Daily Dosing Lasix 60 mg IV twice daily received only 1 dose 12/23  Continue Lasix 60 mg IV twice daily with 1 dose of 500 Diuril 12/25       Guideline Directed Medical Therapy PTA Inpatient Changes   ACEI/ARB No  Renal dysfunction    ARNI No Renal dysfunction    BB Yes, Coreg 3.125 mg BID  Held on 11/26   SGLT-2 Inhibitor Hx of UTI and low GFR     Mineralocorticoid Receptor Antagonist No Renal dysfunction    Hydralazine/Nitrate No Hypotension    Ivabradine No; Hypotension    Heart Rhythm Management Therapy Yes (CRT-D)    Anticoagulation for Afib/flutter apixaban (Eliquis) 5 mg BID    Cardiac Rehab Evaluation for LVEF < or equal to 40% Yes; Date ordered: 04/12/2023    7-Day post hospital follow up scheduled within 24-48 hours of discharge No Discharge not expected within the next 24-48 hours        Most Recent Cardiac Testing/Procedures   Last echo was done on 04/12/2023 shows    Eccentric hypertrophy of the left ventricle with severe dilatation of the left ventricular chamber  Left ventricular systolic function is moderately to severely impaired with an estimate ejection fraction of 30% and global hypokinesis  Right ventricle is moderately dilated with normal function  The left atrium is severely dilated  Pacemaker lead seen in the right atrium and right ventricle  Severe mitral valve insufficiency  Severe tricuspid valve insufficiency  No pericardial effusion  Severe pulmonary hypertension with estimated peak systolic pulmonary artery pressure of 73 mmHg     In comparison to previous echocardiogram from May 2024 the mitral insufficiency was graded as moderate to severe although on my visual comparison it appears similar (severe) to the present study   # Anemia of chronic illness  - received a unit of blood on 04/13/23     #CAD S/P PCI and CABG   - trop 26-24    # AKI on CKD  with baseline 2.0 - 2.2 most likely cardiorenal  - Cr 3.21 on admission  -Avoid any nephrotoxic medication    #AFIB  - continue eliquis 5 mg BID   - on home amiodarone 200 mg daily       #  CRTD in situ    # hypokalemia   - replaced     Past Medical History:    Anxiety    Arthritis    Asthma    Asymptomatic stenosis of right carotid artery    Back pain    Cancer of uterus (HCC)    COPD (chronic obstructive pulmonary disease) (HCC)    Coronary artery disease    Diabetes mellitus (HCC)    Dyslipidemia    Flank mass    Fluid retention    GI bleed    Heart disease    Hypertension    Other malignant neoplasm without specification of site    Sleep apnea    Vision decreased     Surgical History:   Procedure Laterality Date    ROBOT ASSISTED LOW ANTERIOR RESECTION N/A 08/10/2022    Performed by Benetta Spar, MD at [facility] OR    ESOPHAGOGASTRODUODENOSCOPY WITH BIOPSY - FLEXIBLE N/A 09/17/2022    Performed by Tempie Hoist, DO at [facility] ENDO    CATHETERIZATION RIGHT HEART N/A 09/18/2022    Performed by Cath, Physician at [facility] CATH LAB    REMOVAL AND REPLACEMENT IMPLANTABLE DEFIBRILLATOR GENERATOR - SINGLE LEAD SYSTEM Left 11/04/2022    Performed by Kathreen Cornfield, MD at [facility] EP LAB    INSERTION/ REPLACEMENT PERMANENT PACEMAKER WITH ATRIAL LEAD Left 11/04/2022    Performed by Kathreen Cornfield, MD at [facility] EP LAB    Injection Venography Extremity Left 11/04/2022    Performed by Kathreen Cornfield, MD at [facility] EP LAB    ESOPHAGOGASTRODUODENOSCOPY WITH BIOPSY - FLEXIBLE N/A 03/29/2023    Performed by Buckles, Vinnie Level, MD at [facility] ENDO    ESOPHAGOGASTRODUODENOSCOPY WITH SNARE REMOVAL TUMOR/ POLYP/ OTHER LESION - FLEXIBLE N/A 03/29/2023    Performed by Buckles, Vinnie Level, MD at [facility] ENDO    ESOPHAGOGASTRODUODENOSCOPY WITH CONTROL OF BLEEDING - FLEXIBLE N/A 04/04/2023    Performed by Buckles, Vinnie Level, MD at [facility] ENDO    BONE MARROW BIOPSY      CARDIAC DEFIBRILLATOR PLACEMENT      [employer]    CARDIAC SURGERY      CABG 3 V    CAROTID ENDARDECTOMY Right     COLONOSCOPY      HX CORONARY STENT PLACEMENT      HX HEART CATHETERIZATION      HX HYSTERECTOMY      TUNNELED VENOUS PORT PLACEMENT Right     Chest     Family History   Problem Relation Name Age of Onset    Cancer Mother      Diabetes Mother      Hypertension Mother      Cancer-Breast Sister      Cancer-Ovarian Sister      Cancer Sister      Diabetes Sister      Heart Disease Sister      Hypertension Sister      Heart Disease Brother      Hypertension Brother      Stroke Brother       Social History     Socioeconomic History    Marital status: Widowed   Tobacco Use    Smoking status: Former     Current packs/day: 0.00     Average packs/day: 1 pack/day for 20.0 years (20.0 ttl pk-yrs)     Types: Cigarettes     Start date: 64     Quit date: 2010     Years  since quitting: 14.9     Passive exposure: Never    Smokeless tobacco: Never   Vaping Use    Vaping status: Never Used   Substance and Sexual Activity    Alcohol use: Not Currently    Drug use: Not Currently        Allergies:   Allergies   Allergen Reactions    Bortezomib RASH     Noted in Provider note 8/22 cancer treatment drug Allopurinol HIVES    Sulfa (Sulfonamide Antibiotics) NAUSEA ONLY        Medications:  Scheduled Meds:amiodarone (CORDARONE) tablet 200 mg, 200 mg, Oral, QDAY  apixaban (ELIQUIS) tablet 5 mg, 5 mg, Oral, BID  budesonide-formoterol HFA (SYMBICORT) 160-4.5 mcg/actuation inhalation 2 puff, 2 puff, Inhalation, BID  CHOLEcalciferoL (vitamin D3) tablet 1,000 Units, 1,000 Units, Oral, QDAY  cyanocobalamin (vitamin B-12) tablet 1,000 mcg, 1,000 mcg, Oral, QDAY  ferrous sulfate (FEOSOL) tablet 325 mg, 325 mg, Oral, QDAY  furosemide (LASIX) injection 60 mg, 60 mg, Intravenous, BID(9-17)  insulin aspart (U-100) (NOVOLOG FLEXPEN U-100 INSULIN) injection PEN 0-6 Units, 0-6 Units, Subcutaneous, ACHS (22)  pantoprazole DR (PROTONIX) tablet 40 mg, 40 mg, Oral, BID  rosuvastatin (CRESTOR) tablet 10 mg, 10 mg, Oral, QDAY  sertraline (ZOLOFT) tablet 50 mg, 50 mg, Oral, QDAY      Continuous Infusions:   milrinone (PRIMACOR) 20 mg/D5W 100 mL infusion 0.3 mcg/kg/min (04/20/23 0444)       PRN and Respiratory Meds:acetaminophen Q8H PRN, albuterol sulfate Q4H PRN, dextrose 50% (D50) IV PRN, melatonin QHS PRN, nitroglycerin Q5 MIN PRN, ondansetron Q6H PRN **OR** ondansetron (ZOFRAN) IV Q6H PRN, polyethylene glycol 3350 QDAY PRN, sennosides-docusate sodium QDAY PRN    Medications Prior to Admission   Medication Sig Dispense Refill Last Dose/Taking    acetaminophen (TYLENOL) 325 mg tablet Take three tablets by mouth every 8 hours as needed for Pain. Indications: pain 40 tablet 0 Past Month    acyclovir (ZOVIRAX) 400 mg tablet Take one tablet by mouth twice daily.   Past Month    albuterol sulfate (PROAIR HFA) 90 mcg/actuation HFA aerosol inhaler Inhale two puffs by mouth into the lungs every 6 hours as needed for Wheezing or Shortness of Breath.   Past Month    amiodarone (CORDARONE) 200 mg tablet Take one tablet by mouth daily. Indications: prevention of recurrent atrial fibrillation 90 tablet 1 04/18/2023    apixaban (ELIQUIS) 5 mg tablet Take one tablet by mouth twice daily. Indications: afib 60 tablet 0 04/18/2023    budesonide-formoterol HFA (SYMBICORT) 160-4.5 mcg/actuation aerosol inhaler Inhale two puffs by mouth into the lungs twice daily. Indications: bronchospasm prevention with COPD 30.6 g 0 04/18/2023    carvediloL (COREG) 3.125 mg tablet Take one tablet by mouth twice daily with meals. HOLD until instructed to resume by Cardiologist.  Indications: heart failute with rEF   04/18/2023    cholecalciferol (VITAMIN D) 1,000 units tablet Take one tablet by mouth daily.   04/18/2023    cyanocobalamin (VITAMIN B-12) 1,000 mcg tablet Take 5,000 Units by mouth daily.   04/18/2023    dexAMETHasone (DECADRON) 4 mg tablet Take two tablets by mouth every 7 days. Patient is taking 8mg  on Thursdays prior to chemo   Past Month    EMU OIL (BULK) MISC Use  as directed. Apply topically as needed   Unknown    febuxostat (ULORIC) 40 mg tablet Take one-half tablet by mouth daily. Indications: taken on chemo days 45 tablet 0 04/18/2023  ferrous sulfate (FEOSOL) 325 mg (65 mg iron) tablet Take one tablet by mouth daily. Take on an empty stomach at least 1 hour before or 2 hours after food.   04/18/2023    milrinone lactate/D5W (MILRINONE IN D5W IV) Administer  through vein. Milrinone 80mg /214ml  0.45mcg/kg/min intravenously continuous infusion. Dosing wt 78.3 kg   Taking    nitroglycerin (NITROSTAT) 0.4 mg tablet Place one tablet under tongue every 5 minutes as needed for Chest Pain.   Taking As Needed    pantoprazole DR (PROTONIX) 40 mg tablet Take one tablet by mouth twice daily. Indications: gastritis 112 tablet 0 04/18/2023    potassium chloride SR (K-DUR) 20 mEq tablet Resume after cardiology follow up  Indications: prevention of low potassium in the blood   Past Month    rosuvastatin (CRESTOR) 20 mg tablet Take one-half tablet by mouth daily. Indications: excessive fat in the blood 90 tablet 0 04/18/2023    sertraline (ZOLOFT) 50 mg tablet Take one tablet by mouth daily.   04/18/2023    torsemide (DEMADEX) 20 mg tablet Take one tablet by mouth daily. Indications: accumulation of fluid resulting from chronic heart failure 30 tablet 0 04/18/2023        Laboratory Review:   CBC w/Diff    Lab Results   Component Value Date/Time    WBC 7.30 04/20/2023 04:40 AM    RBC 2.80 (L) 04/20/2023 04:40 AM    HGB 8.3 (L) 04/20/2023 04:40 AM    HCT 25.7 (L) 04/20/2023 04:40 AM    MCV 91.8 04/20/2023 04:40 AM    MCH 29.8 04/20/2023 04:40 AM    MCHC 32.5 04/20/2023 04:40 AM    RDW 23.2 (H) 04/20/2023 04:40 AM    PLTCT 300 04/20/2023 04:40 AM    MPV 9.0 04/20/2023 04:40 AM    Lab Results   Component Value Date/Time    NEUT 67 04/20/2023 04:40 AM    ANC 4.90 04/20/2023 04:40 AM    LYMA 16 (L) 04/20/2023 04:40 AM    ALC 1.20 04/20/2023 04:40 AM    MONA 9 04/20/2023 04:40 AM    AMC 0.60 04/20/2023 04:40 AM    EOSA 7 (H) 04/20/2023 04:40 AM    AEC 0.50 (H) 04/20/2023 04:40 AM    BASA 1 04/20/2023 04:40 AM    ABC 0.10 04/20/2023 04:40 AM         Chemistry    Lab Results   Component Value Date/Time    NA 142 04/20/2023 04:40 AM    K 3.7 04/20/2023 04:40 AM    CL 110 04/20/2023 04:40 AM    CO2 21 04/20/2023 04:40 AM    GAP 11 04/20/2023 04:40 AM    BUN 40 (H) 04/20/2023 04:40 AM    CR 3.21 (H) 04/20/2023 04:40 AM    GLU 147 (H) 04/20/2023 04:40 AM    MG 2.0 04/20/2023 04:40 AM    Lab Results   Component Value Date/Time    CA 8.3 (L) 04/20/2023 04:40 AM    PO4 4.1 04/15/2023 03:47 AM    ALBUMIN 3.7 04/19/2023 10:32 AM    TOTPROT 5.6 (L) 04/19/2023 10:32 AM    ALKPHOS 102 04/19/2023 10:32 AM    AST 21 04/19/2023 10:32 AM    ALT 26 04/19/2023 10:32 AM    TOTBILI 0.9 04/19/2023 10:32 AM    GFR 14 (L) 04/20/2023 04:40 AM          Lipid Profile INR   Lab Results  Component Value Date    CHOL 107 09/16/2022    TRIG 72 09/16/2022    HDL 50 09/16/2022    LDL 45 09/16/2022    VLDL 14 09/16/2022    NONHDLCHOL 57 09/16/2022         Lab Results   Component Value Date    INR 3.0 (H) 04/11/2023 Chest X-Ray :  Moderate cardiomegaly with improved findings of CHF. Mild bibasilar   atelectasis with small effusions.      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 with PR of 224      Pt examined and discussed with Dr. Derenda Fennel Adian Jablonowski  Advanced Heart Failure and Heart Transplant Fellow   Available on Voalte

## 2023-04-20 NOTE — Progress Notes
 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 On arrival, she was hemodynamically stable and afebrile, saturating well on room air.  Her initial presentation was suggestive of being grossly fluid overloaded.  Cardiology was consulted and they recommended Lasix 60 mg IV twice daily and admission to medicine for further workup and treatment.     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 Notes that she was 175 when she weighed herself at home   -in the ED: dyspneic, normotensive, not hypoxic. Restless when trying to lay flat.   -NT-pro-BNP: 11636  -troponin 24 ->   - CXR: cardiomegaly with improved findings suggestive of CHF, small bilateral pleural effusions  -last echo May 2024: LVEF 30%, moderate-severe MR, severe TR  -received IV lasix 60mg  x1 in the ED  Plan:  - cardiology consulted, appreciate recs. Continuing lasix 60 mg IV BID   -Added Diuril today  -Goal output of at least 2L/24 hours  -May start lasix gtt if response continues to be poor  -continue to hold off on resuming coreg   - continue PTA milrinone (0.3)   - daily standing weights (ordered)  - strict I/O   - 2000 mg daily Na restriction   - continue PT: apixiban, statin and amiodarone         Other care issues per Dr. Sarina Ill:    AKI on CKD stage 4  Chronic metabolic acidosis  -baseline creatinine ~ 2.0-2.2  - presented with Cr of 3.2  -AKI likely secondary to cardiorenal syndrome  Plan:   - diuretics as above, will recheck with AM labs         Type 2 diabetes  -PTA jardiance and levemir. Holding  Plan:  - ACHS +LDCF         COPD  Subcentimeter nodular opacities in lungs  -CT A/P 12/6: Tree-in-bud and multiple subcentimeter nodular  opacities are   indeterminate.   Plan:  -Follow-up CT chest in 6-8 weeks recommended for reevaluation.   - Continue PTA symbicort.         Chronic anemia  Multiple Myeloma  -[facility]s [facility], on outpatient daratumumab and darbepoetin  - Continue PTA acyclovir prophylaxis.      Hx of Diverticulosis  H/O melena and hematemesis  -12/2 EGD: 1.5 cm friable polyp in duodenum s/p clip x2, no bleeding seen   -12/8 EGD with ulcer in duodenum and visible vessel, no active bleeding.  Epinephrine injection utilized at pace with thermal therapy and mechanical therapy of Endo Clip placed.  Plan:   -Hgb: 9.6 - stable/improved since her last dc   - did need a blood transfusion during her last hospitalization   - will ctm with daily CBC Pancreatic cyst  -Noted during previous admission  -CT A/P 12/6: Mild increase in size of indeterminate large lobulated cystic mass in the tail of pancreas. Endoscopic ultrasound recommended for further evaluation.   -Plan is for EUS in next month for cystic mass in pancreas, TBD        Code status: full code    Diet: Regular    FEN: IVF, Monitoring lytes    Prophylaxis: SCD and lovenox    Disposition: Discharge planning in progress, remains inpatient for now    ________________________________________________________________________        Patient was resting comfortably this morning. No  acute overnight events were reported. No fever or chills overnight, no shortness of breath. Denies cough or chest pain. Bowels moving, Appetite is good.    Medications  Current medications reviewed.  amiodarone (CORDARONE) tablet 200 mg, 200 mg, Oral, QDAY  apixaban (ELIQUIS) tablet 5 mg, 5 mg, Oral, BID  budesonide-formoterol HFA (SYMBICORT) 160-4.5 mcg/actuation inhalation 2 puff, 2 puff, Inhalation, BID  CHOLEcalciferoL (vitamin D3) tablet 1,000 Units, 1,000 Units, Oral, QDAY  cyanocobalamin (vitamin B-12) tablet 1,000 mcg, 1,000 mcg, Oral, QDAY  ferrous sulfate (FEOSOL) tablet 325 mg, 325 mg, Oral, QDAY  furosemide (LASIX) injection 60 mg, 60 mg, Intravenous, BID(9-17)  insulin aspart (U-100) (NOVOLOG FLEXPEN U-100 INSULIN) injection PEN 0-6 Units, 0-6 Units, Subcutaneous, ACHS (22)  pantoprazole DR (PROTONIX) tablet 40 mg, 40 mg, Oral, BID  rosuvastatin (CRESTOR) tablet 10 mg, 10 mg, Oral, QDAY  sertraline (ZOLOFT) tablet 50 mg, 50 mg, Oral, QDAY        Objective  Intake/Output Summary:     Intake/Output Summary (Last 24 hours) at 04/20/2023 1028  Last data filed at 04/20/2023 0444  Gross per 24 hour   Intake 634.42 ml   Output 700 ml   Net -65.58 ml      On Examination:    Vitals:  Blood pressure 94/43, pulse 70, temperature 36.7 ?C (98.1 ?F), height 157.5 cm (5' 2), weight 80.3 kg (177 lb 0.5 oz), SpO2 94%.  Head: Normocephalic, atraumatic.   Ears, Nose, throat: No significant abnormalities.  Eyes: Normal conjunctiva  Respiratory: Normal chest excursions. Occasional ronchi.  CV: Pulses normal. Normal S1 and S2, No murmurs, rubs or gallops appreciated.  Abd: Not distended, non-tender.  No masses palpable. No ascites.  Extremeties: Normal muscle bulk and tone. No edema.  Neuro: No gross neurological deficits or lateralizing signs  Pulses: normal and symmetrical  Skin: Warm and dry    A snapshot of todays labs below:  Lab Results   Component Value Date    NA 142 04/20/2023    K 3.7 04/20/2023  CO2 21 04/20/2023    CL 110 04/20/2023    BUN 40 (H) 04/20/2023    CR 3.21 (H) 04/20/2023    GLU 147 (H) 04/20/2023    AST 21 04/19/2023    ALT 26 04/19/2023    ALKPHOS 102 04/19/2023     Lab Results   Component Value Date    HGB 8.3 (L) 04/20/2023    WBC 7.30 04/20/2023    PLTCT 300 04/20/2023    MCH 29.8 04/20/2023     Lab Results   Component Value Date    GLUPOC 129 (H) 04/20/2023       Radiology and other Diagnostics Review:    Pertinent and recent radiology has been reviewed as part of today's evaluation.    Rudi Coco, MD

## 2023-04-20 NOTE — Case Management (ED)
 Case Management Admission Assessment    NAME:Raven [name]                          MRN: [id number]             DOB:1945/05/23          AGE: 77 y.o.  ADMISSION DATE: 04/19/2023             DAYS ADMITTED: LOS: 0 days      Today?s Date: 04/20/2023    Source of Information: EMR, met with pt at bedside and son via phone       Plan  Plan: Case Management Assessment, Discharge Planning for Home with Post-Acute Care Needs, Assist PRN with SW/NCM Services, Psychosocial Assessment  This CM met with pt for assessment on this date.  Provided contact information and explanation of SW/NCM roles.  Reviewed [facility], Preparing for Discharge, and [facility] hand-outs.  Provided opportunity for questions and discussion. Pt/family encouraged to contact Case Management team with questions and concerns during hospitalization and until patient is able to transition back to the patient's primary care physician.     Return home with [facility]. Son to transport. No new concerns at this time. Has necessary DME and family support    Patient Address/Phone  [address]  [place] [address]  [phone] (home)     Emergency Contact  Extended Emergency Contact Information  Primary Emergency Contact: Farner,CLIFTON  Home Phone: [phone]  Mobile Phone: [phone]  Relation: Son  Secondary Emergency Contact: [facility]  Home Phone: [phone]  Mobile Phone: [phone]  Relation: Relative  Interpreter needed? No    Forensic scientist: Yes, patient has a healthcare directive  Type of Healthcare Directive: Durable power of attorney for healthcare  Location of Healthcare Directive: Patient does not have it with him/her  Would patient like to fill out a (a new) Healthcare Directive?: No, patient declined      Transportation  Does the Patient Need Case Management to Arrange Discharge Transport? (ex: facility, ambulance, wheelchair/stretcher, Medicaid, cab, other): No  Will the Patient Use Family Transport?: Yes  Transportation Name, Phone and Availability #1: Bertis Ruddy [phone]    Expected Discharge Date  04/23/2023     Living Situation Prior to Admission  Living Arrangements  Type of Residence: Home, with Home Health or other assistance  Living Arrangements: Family members  Financial risk analyst / Tub: Psychologist, counselling, Tub/Shower Unit  How many levels in the residence?: 2  Can patient live on one level if needed?: Yes  Does residence have entry and/or inside stairs?: Yes (2-STE)  Assistance needed prior to admit or anticipated on discharge: Yes  Who provides assistance or could if needed?: son, dil and dtr  Are they in good health?: Yes  Can support system provide 24/7 care if needed?: Yes  Level of Function   Prior level of function: Needs assist with ADLs  Which ADLs require assistance?: transport, medication  Who assists with ADLs?: family  Cognitive Abilities   Cognitive Abilities: Alert and Oriented, Understands nature of health condition, Engages in problem solving and planning    Financial Resources  Coverage  Primary Insurance: Medicare Replacement [facility] Medicare)  Medication Coverage    Medication Coverage:  ([employer])  Are current medications affordable?: Yes  Do You Manage Your Own Medications?: Yes  Source of Income   Source Of Income: SSI  Financial Assistance Needed?  na    Psychosocial Needs  Mental Health  Mental Health History: No  Substance Use History  Substance Use History Screen: No  Other  na    Current/Previous Services  PCP  Barbaraann Rondo, [phone], [phone]  Pharmacy    [facility] [address], [place] - [address]  [address]  [place] [address]  Phone: [phone] Fax: [phone]    Durable Medical Equipment   Durable Medical Equipment at home: CPAP/BiPAP, Shower Chair, Environmental consultant, Wheelchair (power)  Home Health  Receiving home health: Yes  Agency name: [facility]  Would patient use this agency again?: Yes  Hemodialysis or Peritoneal Dialysis  Undergoing hemodialysis or peritoneal dialysis: No  Tube/Enteral Feeds  Receive tube/enteral feeds: No  Infusion  Receive infusions: No  Private Duty  Private duty help used: No  Home and Community Based Services  Home and community based services: No  Ryan White  Ryan White: No  Hospice  Hospice: No  Outpatient Therapy  PT: No  OT: No  SLP: No  Skilled Nursing Facility/Nursing Home  SNF: No  NH: No  Inpatient Rehab  IPR: No  Long-Term Acute Care Hospital  LTACH: No  Acute Hospital Stay  Acute Hospital Stay: Yes  Was patient's stay within the last 30 days?: Yes  Name of Hospital: [facility]  Readmission Code Group: 7. Advancement of Disease / Chronic Illness Management  7. Advancement of Disease / Chronic Illness Management: 7a1. Cardiac: CHF / AMI      Radene Ou, LMSW  Available on Voalte  Work Cell / [phone]

## 2023-04-21 LAB — BASIC METABOLIC PANEL
~~LOC~~ BKR ANION GAP: 11 % (ref 3–12)
~~LOC~~ BKR BLD UREA NITROGEN: 38 mg/dL — ABNORMAL HIGH (ref 7–25)
~~LOC~~ BKR CALCIUM: 8.5 mg/dL (ref 8.5–10.6)
~~LOC~~ BKR CHLORIDE: 110 mmol/L — ABNORMAL HIGH (ref 98–110)
~~LOC~~ BKR CO2: 22 mmol/L — ABNORMAL LOW (ref 21–30)
~~LOC~~ BKR CREATININE: 3.1 mg/dL — ABNORMAL HIGH (ref 0.40–1.00)
~~LOC~~ BKR GLOMERULAR FILTRATION RATE (GFR): 15 mL/min — ABNORMAL LOW (ref >60–5)
~~LOC~~ BKR GLUCOSE, RANDOM: 127 mg/dL — ABNORMAL HIGH (ref 70–100)
~~LOC~~ BKR POTASSIUM: 2.9 mmol/L — ABNORMAL LOW (ref 3.5–5.1)
~~LOC~~ BKR POTASSIUM: 3.6 mmol/L (ref 3.5–5.1)
~~LOC~~ BKR SODIUM, SERUM: 143 mmol/L — ABNORMAL LOW (ref 137–147)
~~LOC~~ BKR SODIUM, SERUM: 143 mmol/L — ABNORMAL LOW (ref 137–147)

## 2023-04-21 LAB — POC GLUCOSE
~~LOC~~ BKR POC GLUCOSE: 194 mg/dL — ABNORMAL HIGH (ref 70–100)
~~LOC~~ BKR POC GLUCOSE: 205 mg/dL — ABNORMAL HIGH (ref 70–100)
~~LOC~~ BKR POC GLUCOSE: 133 mg/dL — ABNORMAL HIGH (ref 70–100)
~~LOC~~ BKR POC GLUCOSE: 136 mg/dL — ABNORMAL HIGH (ref 70–100)
~~LOC~~ BKR POC GLUCOSE: 197 mg/dL — ABNORMAL HIGH (ref 70–100)

## 2023-04-21 LAB — CBC AND DIFF
~~LOC~~ BKR RBC COUNT: 2.8 10*6/uL — ABNORMAL LOW (ref 4.00–5.00)
~~LOC~~ BKR WBC COUNT: 7.7 10*3/uL — ABNORMAL HIGH (ref 4.50–11.00)

## 2023-04-21 MED ORDER — DEXTROMETHORPHAN-GUAIFENESIN 10-100 MG/5 ML PO SYRP
10 mL | ORAL | 0 refills | Status: DC | PRN
Start: 2023-04-21 — End: 2023-04-24
  Administered 2023-04-21 – 2023-04-23 (×8): 10 mL via ORAL

## 2023-04-21 MED ORDER — POTASSIUM CHLORIDE 20 MEQ PO TBTQ
80 meq | Freq: Once | ORAL | 0 refills | Status: CP
Start: 2023-04-21 — End: ?
  Administered 2023-04-21: 12:00:00 80 meq via ORAL

## 2023-04-21 NOTE — Care Plan
 Problem: Discharge Planning  Goal: Participation in plan of care  Outcome: Goal Ongoing  Goal: Knowledge regarding plan of care  Outcome: Goal Ongoing  Goal: Prepared for discharge  Outcome: Goal Ongoing     Problem: Moderate Fall Risk  Goal: Moderate Fall Risk  Outcome: Goal Ongoing     Problem: VTE, Risk of  Goal: Absence of venous thrombosis  Outcome: Goal Ongoing  Goal: Knowledge of warfarin regimen  Outcome: Goal Ongoing

## 2023-04-21 NOTE — Progress Notes
 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 On arrival, she was hemodynamically stable and afebrile, saturating well on room air.  Her initial presentation was suggestive of being grossly fluid overloaded.  Cardiology was consulted. Admitted to medicine.      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 Notes that she was 175 when she weighed herself at home   -in the ED: dyspneic, normotensive, not hypoxic. Restless when trying to lay flat.   -NT-pro-BNP: 11636  -troponin 24 ->   - CXR: cardiomegaly with improved findings suggestive of CHF, small bilateral pleural effusions  -last echo May 2024: LVEF 30%, moderate-severe MR, severe TR  -received IV lasix 60mg  x1 in the ED  Plan:  - cardiology consulted, appreciate recs. Continuing lasix 60 mg IV BID   -Added Diuril - good urine output last 24 hours.   -Goal output of at least 2L/24 hours  -May start lasix gtt if response continues to be poor  -continue to hold off on resuming coreg   - continue PTA milrinone (0.3)   - daily standing weights (ordered)  - strict I/O   - 2000 mg daily Na restriction   - continue PT: apixiban, statin and amiodarone     Patient reports she does not feel comfortable with foley but would comply with urine collection via hat. Discussed with RN. Dc foley, continue strict monitoring of Is and Os.        robitussin dm for cough.     Other care issues per Dr. Sarina Ill:     AKI on CKD stage 4  Chronic metabolic acidosis  -baseline creatinine ~ 2.0-2.2  - presented with Cr of 3.2  -AKI likely secondary to cardiorenal syndrome  Plan:   - diuretics  as above, will recheck with AM labs         Type 2 diabetes  -PTA jardiance and levemir. Holding  - A1c 6.3  Plan:  - ACHS +LDCF         COPD  Subcentimeter nodular opacities in lungs  -CT A/P 12/6: Tree-in-bud and multiple subcentimeter nodular opacities are   indeterminate.   Plan:  -Follow-up CT chest in 6-8 weeks recommended for reevaluation.   - Continue PTA symbicort.         Chronic anemia  Multiple Myeloma  -[facility]s [facility], on outpatient daratumumab and darbepoetin  - Continue PTA acyclovir prophylaxis.      Hx of Diverticulosis  H/O melena and hematemesis  -12/2 EGD: 1.5 cm friable polyp in duodenum s/p clip x2, no bleeding seen   -12/8 EGD with ulcer in duodenum and visible vessel, no active bleeding.  Epinephrine injection utilized at pace with thermal therapy and mechanical therapy of Endo Clip placed.  Plan:   -Hgb: 9.6 - stable/improved since her last dc   - did need a blood transfusion during her last hospitalization   - will ctm with daily CBC     Pancreatic cyst  -Noted during previous admission  -CT A/P 12/6: Mild increase in size of indeterminate large lobulated cystic mass in the tail of pancreas. Endoscopic ultrasound recommended for further evaluation.   -Plan is for EUS in next month for cystic mass in pancreas, TBD               FEN:   Cardiac diet.     Prophylaxis Review:  Lines:  p iv  Urinary Catheter:  none  VTE: on AC    Code status: full code.     Disposition: continue inpatient care .        Murlean Caller M.D., FACP  Pager [phone]  ________________________________________________________________________    Subjective    Raven [name] is a 77 y.o. female.  Patient seen and examined. Reports doing okay. Has pain with foley catheter. Patient denies chest pain, dyspnea, fever or chills.  Denies nausea, vomiting or diarrhea.          Objective                       Vital Signs: Last Filed                 Vital Signs: 24 Hour Range   BP: 100/58 (12/25 0837)  Temp: 36.4 ?C (97.6 ?F) (12/25 5409)  Pulse: 78 (12/25 0837)  Respirations: 18 PER MINUTE (12/25 0837)  SpO2: 96 % (12/25 0837)  O2%: 21 % (12/24 2112)  O2 Device: CPAP/BiPAP (12/25 0837) BP: (100-117)/(44-59)   Temp:  [36.3 ?C (97.3 ?F)-37.2 ?C (99 ?F)]   Pulse:  [71-81]   Respirations:  [16 PER MINUTE-18 PER MINUTE]   SpO2:  [91 %-96 %]   O2%:  [21 %]   O2 Device: CPAP/BiPAP         Intake/Output Summary:  (Last 24 hours)    Intake/Output Summary (Last 24 hours) at 04/21/2023 8119  Last data filed at 04/21/2023 1478  Gross per 24 hour   Intake 200 ml   Output 2323 ml   Net -2123 ml       Physical Exam  Physical Exam  Constitutional:       General: She is not in acute distress.     Appearance: She is ill-appearing.  Cardiovascular:      Rate and Rhythm: Normal rate and regular rhythm.      Heart sounds: No murmur heard.  Pulmonary:      Effort: No respiratory distress.      Breath sounds: No stridor. No wheezing.   Abdominal:      General: There is no distension.      Palpations: There is no mass.      Tenderness: There is no abdominal tenderness.   Musculoskeletal:         General: Swelling present.   Skin:     Coloration: Skin is not jaundiced.   Neurological:      Mental Status: She is alert and oriented to person, place, and time.           Medications  Scheduled IV PRN   amiodarone (CORDARONE) tablet 200 mg, 200 mg, Oral, QDAY  apixaban (ELIQUIS) tablet 5 mg, 5 mg, Oral, BID  budesonide-formoterol HFA (SYMBICORT) 160-4.5 mcg/actuation inhalation 2 puff, 2 puff, Inhalation, BID  CHOLEcalciferoL (vitamin D3) tablet 1,000 Units, 1,000 Units, Oral, QDAY  cyanocobalamin (vitamin B-12) tablet 1,000 mcg, 1,000 mcg, Oral, QDAY  ferrous sulfate (FEOSOL) tablet 325 mg, 325 mg, Oral, QDAY  furosemide (LASIX) injection 60 mg, 60 mg, Intravenous, BID(9-17)  insulin aspart (U-100) (NOVOLOG FLEXPEN U-100 INSULIN) injection PEN 0-6 Units, 0-6 Units, Subcutaneous, ACHS (22)  pantoprazole DR (PROTONIX) tablet 40 mg, 40 mg, Oral, BID  rosuvastatin (CRESTOR) tablet 10 mg, 10 mg, Oral, QDAY  sertraline (ZOLOFT) tablet 50 mg, 50 mg, Oral, QDAY      milrinone (PRIMACOR) 20 mg/D5W 100 mL infusion 0.3 mcg/kg/min (04/20/23 2004)    acetaminophen Q8H PRN, albuterol sulfate Q4H PRN, dextrose 50% (D50) IV PRN, melatonin QHS PRN, nitroglycerin Q5 MIN PRN, ondansetron Q6H PRN **OR** ondansetron (ZOFRAN) IV Q6H PRN, polyethylene glycol 3350 QDAY PRN, sennosides-docusate sodium QDAY PRN       Lab Review  24-hour labs:    Results for orders placed or performed during the hospital encounter of 04/19/23 (from the past 24 hours)   POC GLUCOSE    Collection Time: 04/20/23  7:37 PM   Result Value Ref Range    Glucose, POC 205 (H) 70 - 100 mg/dL   POC GLUCOSE    Collection Time: 04/20/23  9:40 PM   Result Value Ref Range Glucose, POC 194 (H) 70 - 100 mg/dL   CBC AND DIFF    Collection Time: 04/21/23  3:11 AM   Result Value Ref Range    White Blood Cells 7.70 4.50 - 11.00 10*3/uL    Red Blood Cells 2.85 (L) 4.00 - 5.00 10*6/uL    Hemoglobin 8.5 (L) 12.0 - 15.0 g/dL    Hematocrit 25.3 (L) 36.0 - 45.0 %    MCV 89.5 80.0 - 100.0 fL    MCH 29.8 26.0 - 34.0 pg    MCHC 33.2 32.0 - 36.0 g/dL    RDW 66.4 (H) 40.3 - 15.0 %    Platelet Count 302 150 - 400 10*3/uL    MPV 8.4 7.0 - 11.0 fL    Neutrophils 67 41 - 77 %    Lymphocytes 13 (L) 24 - 44 %    Monocytes 13 (H) 4 - 12 %    Eosinophils 7 (H) 0 - 5 %    Basophils 1 0 - 2 %    Absolute Neutrophil Count 5.10 1.80 - 7.00 10*3/uL    Absolute Lymph Count 1.00 1.00 - 4.80 10*3/uL  Absolute Monocyte Count 1.00 (H) 0.00 - 0.80 10*3/uL    Absolute Eosinophil Count 0.50 (H) 0.00 - 0.45 10*3/uL    Absolute Basophil Count 0.10 0.00 - 0.20 10*3/uL   BASIC METABOLIC PANEL    Collection Time: 04/21/23  3:11 AM   Result Value Ref Range    Sodium 143 137 - 147 mmol/L    Potassium 2.9 (L) 3.5 - 5.1 mmol/L    Chloride 109 98 - 110 mmol/L    Glucose 143 (H) 70 - 100 mg/dL    Blood Urea Nitrogen 39 (H) 7 - 25 mg/dL    Creatinine 9.81 (H) 0.40 - 1.00 mg/dL    Calcium 8.5 8.5 - 19.1 mg/dL    CO2 21 21 - 30 mmol/L    Anion Gap 13 (H) 3 - 12    Glomerular Filtration Rate (GFR) 15 (L) >60 mL/min   MAGNESIUM    Collection Time: 04/21/23  3:11 AM   Result Value Ref Range    Magnesium 2.0 1.6 - 2.6 mg/dL   POC GLUCOSE    Collection Time: 04/21/23  8:41 AM   Result Value Ref Range    Glucose, POC 133 (H) 70 - 100 mg/dL   BASIC METABOLIC PANEL    Collection Time: 04/21/23 11:55 AM   Result Value Ref Range    Sodium 143 137 - 147 mmol/L    Potassium 3.6 3.5 - 5.1 mmol/L    Chloride 110 98 - 110 mmol/L    Glucose 127 (H) 70 - 100 mg/dL    Blood Urea Nitrogen 38 (H) 7 - 25 mg/dL    Creatinine 4.78 (H) 0.40 - 1.00 mg/dL    Calcium 8.5 8.5 - 29.5 mg/dL    CO2 22 21 - 30 mmol/L    Anion Gap 11 3 - 12    Glomerular Filtration Rate (GFR) 15 (L) >60 mL/min   POC GLUCOSE    Collection Time: 04/21/23 12:04 PM   Result Value Ref Range    Glucose, POC 136 (H) 70 - 100 mg/dL              Point of Care Testing  (Last 24 hours)  Glucose: (!) 143 (04/21/23 0311)  POC Glucose (Download): (!) 133 (04/21/23 0841)

## 2023-04-21 NOTE — Progress Notes
 Heart Failure Progress Note    NAME:Raven [name]             MRN: [id number]                 DOB:04/09/1946          AGE: 77 y.o.  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 She was recently admitted and discharged from 04/02/2023 till 04/09/23 for GI bleed and required 2 units of pRBC. Her home torsemide was held due to bump in her creatinine with plans to follow up with Cardiology outpatient.    Patient was recently admitted from 04/11/2023 till 04/15/2023 as a case of heart failure exacerbation and was found that her milrinone pump was not working and was resumed and then was adequately diuresed and was sent home on torsemide 20 mg daily .     She presented to the heart failure clinic as a posthospital discharge follow-up and was found to be dyspneic and having fluid overload for which she was told to go to the ER to be admitted.  She reports that she has gained about 10 pounds over the last 2 days associated with shortness of breath especially when she lays flat and lightheadedness.  Patient stated that she was on torsemide 20 mg twice daily prior to her recent hospitalization for which was held due to worsening kidney function and was sent recently torsemide 20 mg p.o. daily. Patient has not been taking her Coreg since last 2 hospitalization as she is supposed to see cardiology as outpatient to resume it.  Her NT proBNP on admission was 11,636 with chest x-ray shows Mild bibasilar atelectasis with small effusions.  She will be admitted for IV diuresis    Recommendations:  Aggressively replace K+ and recheck BMP at 11am  Diuretics: Continue Lasix 60 mg IV twice daily.  Consider repeating diuril with this evening's dose of lasix if k+ normalized on repeat labs  GDMT: Continue to hold home coreg until she sees cardiology out patient., currently other GDMT is not an option due to her kidney function.   Continue home milrinone at 0.80mcg/kg/min  Standing daily weight as well as strict I&Os   HF team will continue to follow      Ongoing:  BMP once a day. Magnesium level daily.  Keep Potassium greater than 4.0 and Magnesium greater than 2.0.  2000mg  sodium dietary restriction.  Fluid Restriction:1.5  Strict I/O. Goal output: net neg 2L/24 hour  Daily standing scale weight. Goal Dry Weight: TBD    Please place a specimen in lab NT-proBNP order on day of discharge in effort to establish baseline value correlating with suspected euvolemia.      Primary team responsible for placing Post-Discharge Health System Appointment Request order set for Cardiology: Heart Failure appointment request within 24-48 hours of discharge. (Please do not place order any earlier in effort to reduce possible need for cancellation and rescheduling of visit).      Ivory Broad, APRN  Heart Failure Consult Service  Available on Voalte    Objective:                       Vital Signs:  Last Filed                Vital Signs: 24 Hour Range   BP: 116/59 (12/25 0312)  Temp: 36.8 ?C (98.2 ?F) (12/25 4742)  Pulse: 75 (12/25 0055)  Respirations: 18 PER MINUTE (12/25 0312)  SpO2: 94 % (12/25 0312)  O2%: 21 % (12/24 2112)  O2 Device: CPAP/BiPAP (12/25 0312)  BP: (94-117)/(43-59)   Temp:  [36.3 ?C (97.3 ?F)-37.2 ?C (99 ?F)]   Pulse:  [70-81]   Respirations:  [16 PER MINUTE-18 PER MINUTE]   SpO2:  [91 %-94 %]   O2%:  [21 %]   O2 Device: CPAP/BiPAP           Wt Readings from Last 10 Encounters:   04/21/23 79 kg (174 lb 2.6 oz)   04/15/23 77.6 kg (171 lb)   04/06/23 75.9 kg (167 lb 5.3 oz)   03/26/23 74.2 kg (163 lb 9.3 oz)   03/19/23 77 kg (169 lb 12.8 oz)   02/16/23 75.3 kg (166 lb)   02/15/23 75.3 kg (166 lb)   01/04/23 75.2 kg (165 lb 12.8 oz)   12/14/22 74.6 kg (164 lb 6.4 oz)   11/30/22 79.2 kg (174 lb 9.6 oz)       Physical Exam:    General Appearance: no distress, overweight, on bipap (had been sleeping)  Neck Veins: JVP is elevated 10 but patient also has severe TR, HJR positive  Auscultation: breathing comfortably, lungs clear to auscultation, no rales or rhonchi, no wheezing  Cardiac Auscultation: Regular rhythm, S1, S2, no S3 or S4, pan systolic murmur  Lower Extremity Edema: 1+ bilateral LLE edema to midshins  Abdominal Exam: soft, non-tender, bowel sounds normal  Orientation: clear historian, good insight      Assessment:   Acute Exacerbation systolic and diastolic HFrEF,  EF: 30%.   Major Complications or Comorbidities [facility]): acute respiratory failure and acute/ acute on chronic systolic and/or diastolic heart failure  NYHA functional class IV (unable to carry on any physical activity without symptoms of HF, or symptoms of HF at rest),   ACC Stage D (refractory HF requiring specialized interventions).   She presents with signs of hypervolemia with Left  ventricular failure with signs of low flow state.    BNP Labs:   Lab Results   Component Value Date    NTPROBNP 11,636 (H) 04/19/2023    NTPROBNP 11,324 (H)  04/12/2023    NTPROBNP 12,291 (H) 04/11/2023       High Sensitivity Troponin:  Lab Results   Component Value Date    HSTROP0HR 26 (H) 04/19/2023    HSTROP2HR 24 (H) 04/19/2023    HSTROP4HR 28 (H) 04/12/2023    HSTROPDELT4 -7 04/12/2023    HSTROPDELTA -2 04/19/2023    HIGHSTROPI 28 (H) 04/12/2023         Admission Weight: 80.6 kg (177 lb 9.6 oz)        Most recent weights (inpatient):   Vitals:    04/19/23 1021 04/19/23 1630 04/21/23 0500   Weight: 80.6 kg (177 lb 9.6 oz) 80.3 kg (177 lb 0.5 oz) 79 kg (174 lb 2.6 oz)        I/O:       Intake/Output Summary (Last 24 hours) at 04/21/2023 0718  Last data filed at 04/21/2023 0313  Gross per 24 hour   Intake 650 ml   Output 2323 ml   Net -1673 ml       Net IO Since Admission: -1,738.58 mL [04/21/23 0718]      PLAN:  Diuretic Therapy    Prior to admission dose    Torsemide 20 mg daily    Given on admission    Lasix 60 mg IV once    Daily Dosing Lasix 60 mg IV twice daily received only 1 dose 12/23  12/24: Continue Lasix 60 mg IV twice daily with 1 dose of 500 Diuril   12/25: lasix 60mg  IV BID; holding diuril until k+ replaced       Guideline Directed Medical Therapy PTA Inpatient Changes   ACEI/ARB No  Renal dysfunction    ARNI No Renal dysfunction    BB Yes, Coreg 3.125 mg BID  Held on 11/26   SGLT-2 Inhibitor Hx of UTI and low GFR     Mineralocorticoid Receptor Antagonist No Renal dysfunction    Hydralazine/Nitrate No Hypotension    Ivabradine No; Hypotension    Heart Rhythm Management Therapy Yes (CRT-D)    Anticoagulation for Afib/flutter apixaban (Eliquis) 5 mg BID continued   Cardiac Rehab Evaluation for LVEF < or equal to 40% Yes; Date ordered: 04/12/2023    7-Day post hospital follow up scheduled within 24-48 hours of discharge No Discharge not expected within the next 24-48 hours Most Recent Cardiac Testing/Procedures   Last echo was done on 04/12/2023 shows    Eccentric hypertrophy of the left ventricle with severe dilatation of the left ventricular chamber  Left ventricular systolic function is moderately to severely impaired with an estimate ejection fraction of 30% and global hypokinesis  Right ventricle is moderately dilated with normal function  The left atrium is severely dilated  Pacemaker lead seen in the right atrium and right ventricle  Severe mitral valve insufficiency  Severe tricuspid valve insufficiency  No pericardial effusion  Severe pulmonary hypertension with estimated peak systolic pulmonary artery pressure of 73 mmHg     In comparison to previous echocardiogram from May 2024 the mitral insufficiency was graded as moderate to severe although on my visual comparison it appears similar (severe) to the present study   # Anemia of chronic illness  - received a unit of blood on 04/13/23     #CAD S/P PCI and CABG   - trop 26-24  - on PTA statin therapy    # AKI on CKD  with baseline 2.0 - 2.2 most likely cardiorenal  - Cr 3.21 on admission  - creatinine  trends:  Lab Results   Component Value Date/Time    CR 3.12 (H) 04/21/2023 03:11 AM    CR 3.21 (H) 04/20/2023 04:40 AM    CR 3.21 (H) 04/19/2023 10:32 AM    CR 2.87 (H) 04/15/2023 03:47 AM   - Avoid any nephrotoxic medication  - at least daily BMPs with diuresis    #AFIB  - continue eliquis 5 mg BID   - on home amiodarone 200 mg daily       #CRTD in situ  - device interrogation 12/16: elevated HF diagnostics, NSR at 75bpm, apaced <1%, Vpaced <1%    # hypokalemia   - 12/25 K+ 2.9->60meq K+ ordered->plan to repeat BMP at 11am to recheck K+ level    Laboratory Review:   CBC w/Diff    Lab Results   Component Value Date/Time    WBC 7.70 04/21/2023 03:11 AM    RBC 2.85 (L) 04/21/2023 03:11 AM    HGB 8.5 (L) 04/21/2023 03:11 AM    HCT 25.5 (L) 04/21/2023 03:11 AM    MCV 89.5 04/21/2023 03:11 AM    MCH 29.8 04/21/2023 03:11 AM MCHC 33.2 04/21/2023 03:11 AM    RDW 23.1 (H) 04/21/2023 03:11 AM    PLTCT 302 04/21/2023 03:11 AM    MPV 8.4 04/21/2023 03:11 AM    Lab Results   Component Value Date/Time    NEUT 67 04/21/2023 03:11 AM    ANC 5.10 04/21/2023 03:11 AM    LYMA 13 (L) 04/21/2023 03:11 AM    ALC 1.00 04/21/2023 03:11 AM    MONA 13 (H) 04/21/2023 03:11 AM    AMC 1.00 (H) 04/21/2023 03:11 AM    EOSA 7 (H) 04/21/2023 03:11 AM    AEC 0.50 (H) 04/21/2023 03:11 AM    BASA 1 04/21/2023 03:11 AM    ABC 0.10 04/21/2023 03:11 AM         Chemistry    Lab Results   Component Value Date/Time    NA 143 04/21/2023 03:11 AM    K 2.9 (L) 04/21/2023 03:11 AM    CL 109 04/21/2023 03:11 AM    CO2 21 04/21/2023 03:11 AM    GAP 13 (H) 04/21/2023 03:11 AM    BUN 39 (H) 04/21/2023 03:11 AM    CR 3.12 (H) 04/21/2023 03:11 AM    GLU 143 (H) 04/21/2023 03:11 AM    MG 2.0 04/21/2023 03:11 AM    Lab Results   Component Value Date/Time    CA 8.5 04/21/2023 03:11 AM    PO4 4.1 04/15/2023 03:47 AM    ALBUMIN 3.7 04/19/2023 10:32 AM    TOTPROT 5.6 (L) 04/19/2023 10:32 AM    ALKPHOS 102 04/19/2023 10:32 AM    AST 21 04/19/2023 10:32 AM    ALT 26 04/19/2023 10:32 AM    TOTBILI 0.9 04/19/2023 10:32 AM    GFR 15 (L) 04/21/2023 03:11 AM          Lipid Profile INR   Lab Results   Component Value Date    CHOL 107 09/16/2022    TRIG 72 09/16/2022    HDL 50 09/16/2022    LDL 45 09/16/2022    VLDL 14 09/16/2022    NONHDLCHOL 57 09/16/2022         Lab Results   Component Value Date    INR 3.0 (H) 04/11/2023          Chest X-Ray :  Moderate cardiomegaly with improved findings of CHF.  Mild bibasilar   atelectasis with small effusions.      Tele: SR with occasional PVCs, HR 70s    ECG: 1st degree heart block with PR of 224

## 2023-04-22 ENCOUNTER — Encounter: Admit: 2023-04-22 | Discharge: 2023-04-22 | Payer: MEDICARE

## 2023-04-22 LAB — POC GLUCOSE
~~LOC~~ BKR POC GLUCOSE: 168 mg/dL — ABNORMAL HIGH (ref 70–100)
~~LOC~~ BKR POC GLUCOSE: 100 mg/dL (ref 70–100)
~~LOC~~ BKR POC GLUCOSE: 134 mg/dL — ABNORMAL HIGH (ref 70–100)
~~LOC~~ BKR POC GLUCOSE: 151 mg/dL — ABNORMAL HIGH (ref 70–100)
~~LOC~~ BKR POC GLUCOSE: 181 mg/dL — ABNORMAL HIGH (ref 70–100)
~~LOC~~ BKR POC GLUCOSE: 137 mg/dL — ABNORMAL HIGH (ref 70–100)

## 2023-04-22 LAB — CBC AND DIFF
~~LOC~~ BKR RBC COUNT: 2.9 10*6/uL — ABNORMAL LOW (ref 4.00–5.00)
~~LOC~~ BKR WBC COUNT: 8.2 10*3/uL — ABNORMAL HIGH (ref 4.50–11.00)

## 2023-04-22 MED ORDER — POTASSIUM CHLORIDE 20 MEQ PO TBTQ
60 meq | Freq: Once | ORAL | 0 refills | Status: CP
Start: 2023-04-22 — End: ?
  Administered 2023-04-22: 16:00:00 60 meq via ORAL

## 2023-04-22 MED ORDER — POTASSIUM CHLORIDE 20 MEQ PO TBTQ
40 meq | Freq: Once | ORAL | 0 refills | Status: CP
Start: 2023-04-22 — End: ?
  Administered 2023-04-22: 23:00:00 40 meq via ORAL

## 2023-04-22 MED ORDER — CHLOROTHIAZIDE SODIUM 500 MG IV SOLR
500 mg | Freq: Once | INTRAVENOUS | 0 refills | Status: CP
Start: 2023-04-22 — End: ?
  Administered 2023-04-22: 23:00:00 500 mg via INTRAVENOUS

## 2023-04-22 MED ORDER — MAGNESIUM OXIDE 400 MG (241.3 MG MAGNESIUM) PO TAB
800 mg | Freq: Once | ORAL | 0 refills | Status: CP
Start: 2023-04-22 — End: ?
  Administered 2023-04-22: 16:00:00 800 mg via ORAL

## 2023-04-22 MED ADMIN — WATER FOR INJECTION, STERILE IJ SOLN [79513]: 20 mL | INTRAVENOUS | @ 23:00:00 | Stop: 2023-04-22 | NDC 00409488723

## 2023-04-22 NOTE — Progress Notes
 PHYSICAL THERAPY  MOBILITY NOTE      Name: Raven [name]   MRN: [id number]     DOB: 1945-06-24      Age: 77 y.o.  Admission Date: 04/19/2023     LOS: 0 days     Date of Service: 04/22/2023    As part of the ongoing rehabilitation plan of care, patient was mobilized and/or completed ADLs with assistance from the rehabilitation technician .    Mobility  Patient Turn/Position: Self  Progressive Mobility Level: Walk in hallway  Distance Walked (feet): 240 ft  Level of Assistance: Assist X1  Assistive Device: Walker  Activity Limited By: Lydia Guiles / [employer]    Rehab Technician: Carilyn Goodpasture  Date: 04/22/2023

## 2023-04-22 NOTE — Progress Notes
 PHYSICAL THERAPY  ASSESSMENT      Name: Raven [name]   MRN: [id number]     DOB: 06-11-1945      Age: 77 y.o.  Admission Date: 04/19/2023     LOS: 0 days     Date of Service: 04/22/2023      Mobility  Patient Turn/Position: Chair  Progressive Mobility Level: Walk in hallway  Distance Walked (feet): 100 ft (x2)  Level of Assistance: Assist X1  Assistive Device: Walker  Activity Limited By: Fatigue    Subjective  Significant Hospital Events: 77 y.o. female with a PMH of CHFrEF (EF 30%) stage D HF on milrinone 0.3, ICM, CAD s/p CABG, severe mitral valve regurgitation, CRTD, carotid artery disease with prior endarterectomy, COPD, CKD stage 4 ,multiple myeloma, endometrial cancer s/p hysterectomy, and diverticulosis who presented to the ED form cardiology clinic due to concerns about being fluid overloaded and endorsing SOB.  Mental / Cognitive: Alert;Oriented;Cooperative;Follows commands  Pain: No complaint of pain  Pain Interventions: Patient assisted into position of comfort  Persons Present: Provider    Home Living Situation  Lives With: Family  Type of Home: House  Entry Stairs: 2;Rail on both sides  In-Home Stairs: 1 flight;Stair glide  Bathroom Setup: Tub/shower unit  Patient Owned Equipment: Bathing: Soil scientist: Occupational hygienist  Comments: Power scooter for community distances    Prior Level of Function  Level Of Independence: Independent with ADL and household mobility with device  Required Assist For: All home functioning ADL  History of Falls in Past 3 Months: No    ROM  R UE ROM: WFL  L UE ROM: WFL  R LE ROM: WFL  L LE ROM: WFL    Strength  Overall Strength: Generalized weakness    Posture/Neurological  Posture: Rounded shoulders;Flexed trunk;Forward head  Overall Tone: Normal    Bed Mobility/Transfer  Bed Mobility: Supine to Sit: Independent  Transfer Type: Sit to/from Stand  Transfer: Assistance Level: To/From;Bed;Toilet;Standby Assist  Transfer: Assistive Device: Nurse, adult  Transfers: Type Of Assistance: For Safety Considerations  End Of Activity Status: Up in Chair;Nursing Notified;Instructed Patient to Request Assist with Mobility;Instructed Patient to Use Call Light    Balance  Sitting Balance: No UE Support;Standby Assist  Standing Balance: 2 UE support;Standby Assist    Gait  Gait Distance: 100 feet (x2)  Gait: Assistance Level: Standby Assist  Gait: Assistive Device: Technical brewer: Descriptors: Forward trunk flexion;Pace: Slow;No balance loss  Comments: Seated rest between bouts of gait due to fatigue.    Education  Persons Educated: Patient  Patient Barriers To Learning: None Noted  Teaching Methods: Verbal Instruction  Patient Response: Verbalized Understanding  Topics: Plan/Goals of PT Interventions;Use of Assistive Device/Orthosis;Mobility Progression;Up with Assist Only;Importance of Increasing Activity;Equipment Recommendations;Recommend Continued Therapy    Assessment/Progress  Impaired Mobility Due To: Decreased Strength;Impaired Balance;Decreased Activity Tolerance;Deconditioning;Medical Status Limitation  Impaired Strength Due To: Deconditioning  Assessment/Progress: Should Improve w/ Continued PT  Comments: Patient presents with balance, strength, and endurance deficits. Will benefit from ongoing PT to improve functional mobility.    AM-PAC 6 Clicks Basic Mobility Inpatient  Turning from your back to your side while in a flat bed without using bed rails: None  Moving from lying on your back to sitting on the side of a flat bed without using bedrails : None  Moving to and from a bed to a chair (including a wheelchair): A Little  Standing up from a chair using your arms (e.g. wheelchair, or bedside  chair): A Little  To walk in hospital room: A Little  Climbing 3-5 steps with a railing: A Little  Basic Mobility Inpatient Raw Score: 20  Standardized (T-scale) Score: 43.99    Goals  Goal Formulation: With Patient  Time For Goal Achievement: 3 days, To, 5 days  Patient Will Transfer Bed/Chair: w/ Stand By Assist  Patient Will Ambulate: Greater than 200 Feet, w/ Dan Humphreys, w/ Stand By Assist  Patient Will Go Up / Down Stairs: 1-2 Stairs, w/ Stand By Assist    Plan  Treatment Interventions: Mobility training;Strengthening;Balance activities;Endurance Psychologist, prison and probation services to assist with ongoing mobilization, ADLs and/or exercise per instructions of licensed therapy staff  Plan Frequency: 1-2 Days per Week  PT Plan for Next Visit: Progress gait, trial steps    PT Discharge Recommendations  Recommendation: Home with intermittent supervision/assistance  Patient Currently Requires Equipment: Owns what is needed    Therapist  Harriett Rush, PT, DPT  Date  04/22/2023

## 2023-04-22 NOTE — Progress Notes
 Heart Failure Progress Note    NAME:Raven [name]             MRN: [id number]                 DOB:22-Feb-1946          AGE: 77 y.o.  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 She was recently admitted and discharged from 04/02/2023 till 04/09/23 for GI bleed and required 2 units of pRBC. Her home torsemide was held due to bump in her creatinine with plans to follow up with Cardiology outpatient.    Patient was recently admitted from 04/11/2023 till 04/15/2023 as a case of heart failure exacerbation and was found that her milrinone pump was not working and was resumed and then was adequately diuresed and was sent home on torsemide 20 mg daily .     She presented to the heart failure clinic as a posthospital discharge follow-up and was found to be dyspneic and having fluid overload for which she was told to go to the ER to be admitted.  She reports that she has gained about 10 pounds over the last 2 days associated with shortness of breath especially when she lays flat and lightheadedness.  Patient stated that she was on torsemide 20 mg twice daily prior to her recent hospitalization for which was held due to worsening kidney function and was sent recently torsemide 20 mg p.o. daily. Patient has not been taking her Coreg since last 2 hospitalization as she is supposed to see cardiology as outpatient to resume it.  Her NT proBNP on admission was 11,636 with chest x-ray shows Mild bibasilar atelectasis with small effusions.  She will be admitted for IV diuresis    Recommendations:  Aggressively replace K+ and Mg   Diuretics: Continue Lasix 60 mg IV twice daily.  Diuril 500 IV once today 12/26.  She is close to her weight dry weight 165-161 pounds, will switch her to PO torsemide 20 mg BID on 12/27   GDMT: Continue to hold home coreg until she sees cardiology out patient., currently other GDMT is not an option due  to her kidney function.   Continue home milrinone at 0.47mcg/kg/min  Standing daily weight as well as strict I&Os   Thank you for the consult, HF team will sign off. Please reach out with any question      Ongoing:  BMP once a day. Magnesium level daily.  Keep Potassium greater than 4.0 and Magnesium greater than 2.0.  2000mg  sodium dietary restriction.  Fluid Restriction:1.5  Strict I/O. Goal output: net neg 2L/24 hour  Daily standing scale weight. Goal Dry Weight: TBD    Please place a specimen in lab NT-proBNP order on day of discharge in effort to establish baseline value correlating with suspected euvolemia.      Primary team responsible for placing Post-Discharge Health System Appointment Request order set for Cardiology: Heart Failure appointment request within 24-48 hours of discharge. (Please do not place order any earlier in effort to reduce possible need for cancellation and rescheduling of visit).    Patient was seen and discussed with the attending Dr. Logan Bores  Advanced Heart Failure and Heart Transplant Fellow     Subjective  Patient was seen and examined this morning  She feels good with no issues with breathing  She denies any chest pain or palpitation  No acute events overnight    Objective:                       Vital Signs:  Last Filed                Vital Signs: 24 Hour Range   BP: 109/52 (12/26 0253)  Temp: 36.5 ?C (97.7 ?F) (12/26 0253)  Pulse: 79 (12/26 0253)  Respirations: 18 PER MINUTE (12/26 0253)  SpO2: 93 % (12/26 0253)  O2%: 21 % (12/25 2119)  O2 Device: CPAP/BiPAP (12/26 0253)  BP: (100-134)/(52-58)   Temp:  [36.1 ?C (96.9 ?F)-36.6 ?C (97.8 ?F)]   Pulse:  [68-82]   Respirations:  [18 PER MINUTE]   SpO2:  [93 %-98 %]   O2%:  [21 %]   O2 Device: CPAP/BiPAP           Wt Readings from Last 10 Encounters:   04/22/23 76.8 kg (169 lb 6.4 oz)   04/15/23 77.6 kg (171 lb)   04/06/23 75.9 kg (167 lb 5.3 oz)   03/26/23 74.2 kg (163 lb 9.3 oz)   03/19/23 77 kg (169 lb 12.8 oz)   02/16/23 75.3 kg (166 lb)   02/15/23 75.3 kg (166 lb)   01/04/23 75.2 kg (165 lb 12.8 oz)   12/14/22 74.6 kg (164 lb 6.4 oz)   11/30/22 79.2 kg (174 lb 9.6 oz)       Physical Exam:    General Appearance: no distress, overweight, on bipap (had been sleeping)  Neck Veins: JVP is elevated 9 but patient also has severe TR, HJR positive  Auscultation: breathing comfortably, lungs clear to auscultation, no rales or rhonchi, no wheezing  Cardiac Auscultation: Regular rhythm, S1, S2, no S3 or S4, pan systolic murmur  Lower Extremity Edema: 1+ bilateral LLE edema to midshins  Abdominal Exam: soft, non-tender, bowel sounds normal  Orientation: clear historian, good insight      Assessment:   Acute Exacerbation systolic and diastolic HFrEF,  EF: 30%.   Major Complications or Comorbidities [facility]): acute respiratory failure and acute/ acute on chronic systolic and/or diastolic heart failure  NYHA functional class IV (unable to carry on any physical activity without  symptoms of HF, or symptoms of HF at rest),   ACC Stage D (refractory HF requiring specialized interventions).   She presents with signs of hypervolemia with Left  ventricular failure with signs of low flow state.    BNP Labs:   Lab Results   Component Value Date    NTPROBNP 11,636 (H) 04/19/2023    NTPROBNP 11,324 (H) 04/12/2023    NTPROBNP 12,291 (H) 04/11/2023       High Sensitivity Troponin:  Lab Results   Component Value Date    HSTROP0HR 26 (H) 04/19/2023    HSTROP2HR 24 (H) 04/19/2023    HSTROP4HR 28 (H) 04/12/2023    HSTROPDELT4 -7 04/12/2023    HSTROPDELTA -2 04/19/2023    HIGHSTROPI 28 (H) 04/12/2023         Admission Weight: 80.6 kg (177 lb 9.6 oz)        Most recent weights (inpatient):   Vitals:    04/19/23 1630 04/21/23 0500 04/22/23 0253   Weight: 80.3 kg (177 lb 0.5 oz) 79 kg (174 lb 2.6 oz) 76.8 kg (169 lb 6.4 oz)        I/O:       Intake/Output Summary (Last 24 hours) at 04/22/2023 0729  Last data filed at 04/22/2023 0454  Gross per 24 hour   Intake 675 ml   Output 1650 ml   Net -975 ml       Net IO Since Admission: -2,713.58 mL [04/22/23 0729]      PLAN:  Diuretic Therapy    Prior to admission dose    Torsemide 20 mg daily    Given on admission    Lasix 60 mg IV once    Daily Dosing Lasix 60 mg IV twice daily received only 1 dose 12/23  12/24: Continue Lasix 60 mg IV twice daily with 1 dose of 500 Diuril   12/25: lasix 60mg  IV BID; holding diuril until k+ replaced  12/26:   12/26 Lasix 60 mg IV BID, Diuril 500 IV once, replaced mg and K      Guideline Directed Medical Therapy PTA Inpatient Changes   ACEI/ARB No  Renal dysfunction    ARNI No Renal dysfunction    BB Yes, Coreg 3.125 mg BID  Held on 11/26   SGLT-2 Inhibitor Hx of UTI and low GFR     Mineralocorticoid Receptor Antagonist No Renal dysfunction    Hydralazine/Nitrate No Hypotension    Ivabradine No; Hypotension    Heart Rhythm Management Therapy Yes (CRT-D)    Anticoagulation for Afib/flutter apixaban (Eliquis) 5 mg BID continued   Cardiac Rehab Evaluation for LVEF < or equal to 40% Yes; Date ordered: 04/12/2023    7-Day post hospital follow up scheduled within 24-48 hours of discharge No Discharge not expected within the next 24-48 hours        Most Recent Cardiac Testing/Procedures   Last echo was done on 04/12/2023 shows    Eccentric hypertrophy of the left ventricle with severe dilatation of the left ventricular chamber  Left ventricular systolic function is moderately to severely impaired with an estimate ejection fraction of 30% and global hypokinesis  Right ventricle is moderately dilated with normal function  The left atrium is severely dilated  Pacemaker lead seen in the right atrium and right ventricle  Severe mitral valve insufficiency  Severe tricuspid valve insufficiency  No pericardial effusion  Severe pulmonary hypertension with estimated peak systolic pulmonary artery pressure of 73 mmHg     In comparison  to previous echocardiogram from May 2024 the mitral insufficiency was graded as moderate to severe although on my visual comparison it appears similar (severe) to the present study   # Anemia of chronic illness  - received a unit of blood on 04/13/23     #CAD S/P PCI and CABG   - trop 26-24  - on PTA statin therapy    # AKI on CKD  with baseline 2.0 - 2.2 most likely cardiorenal  - Cr 3.21 on admission  - creatinine trends:  Lab Results   Component Value Date/Time    CR 3.19 (H) 04/22/2023 05:11 AM    CR 3.13 (H) 04/21/2023 11:55 AM    CR 3.12 (H) 04/21/2023 03:11 AM    CR 3.21 (H) 04/20/2023 04:40 AM   - Avoid any nephrotoxic medication  - at least daily BMPs with diuresis    #AFIB  - continue eliquis 5 mg BID   - on home amiodarone 200 mg daily       #CRTD in situ  - device interrogation 12/16: elevated HF diagnostics, NSR at 75bpm, apaced <1%, Vpaced <1%    # hypokalemia   - 12/25 K+ 2.9->90meq K+ ordered->plan to repeat BMP at 11am to recheck K+ level    Laboratory Review:   CBC w/Diff    Lab Results   Component Value Date/Time    WBC 8.20 04/22/2023 05:11 AM    RBC 2.90 (L) 04/22/2023 05:11 AM    HGB 8.8 (L) 04/22/2023 05:11 AM    HCT 26.5 (L) 04/22/2023 05:11 AM    MCV 91.2 04/22/2023 05:11 AM    MCH 30.2 04/22/2023 05:11 AM    MCHC 33.1 04/22/2023 05:11 AM    RDW 22.9 (H) 04/22/2023 05:11 AM    PLTCT 303 04/22/2023 05:11 AM    MPV 8.6 04/22/2023 05:11 AM    Lab Results   Component Value Date/Time    NEUT 65 04/22/2023 05:11 AM    ANC 5.40 04/22/2023 05:11 AM    LYMA 17 (L) 04/22/2023 05:11 AM    ALC 1.40 04/22/2023 05:11 AM    MONA 12 04/22/2023 05:11 AM    AMC 1.00 (H) 04/22/2023 05:11 AM    EOSA 6 (H) 04/22/2023 05:11 AM    AEC 0.50 (H) 04/22/2023 05:11 AM    BASA 1 04/22/2023 05:11 AM    ABC 0.10 04/22/2023 05:11 AM         Chemistry    Lab Results   Component Value Date/Time    NA 142 04/22/2023 05:11 AM    K 3.2 (L) 04/22/2023 05:11 AM    CL 107 04/22/2023 05:11 AM    CO2 24 04/22/2023 05:11 AM    GAP 11 04/22/2023 05:11 AM    BUN 36 (H) 04/22/2023 05:11 AM    CR 3.19 (H) 04/22/2023 05:11 AM    GLU 200 (H) 04/22/2023 05:11 AM    MG 1.9 04/22/2023 05:11 AM    Lab Results   Component Value Date/Time    CA 8.4 (L) 04/22/2023 05:11 AM    PO4 4.1 04/15/2023 03:47 AM    ALBUMIN 3.7 04/19/2023 10:32 AM    TOTPROT 5.6 (L) 04/19/2023 10:32 AM    ALKPHOS 102 04/19/2023 10:32 AM    AST 21 04/19/2023 10:32 AM    ALT 26 04/19/2023 10:32 AM    TOTBILI 0.9 04/19/2023 10:32 AM    GFR 14 (L) 04/22/2023 05:11 AM  Lipid Profile INR   Lab Results   Component Value Date    CHOL 107 09/16/2022    TRIG 72 09/16/2022    HDL 50 09/16/2022    LDL 45 09/16/2022    VLDL 14 09/16/2022    NONHDLCHOL 57 09/16/2022         Lab Results   Component Value Date    INR 3.0 (H) 04/11/2023          Chest X-Ray :  Moderate cardiomegaly with improved findings of CHF. Mild bibasilar atelectasis with small effusions.      Tele: SR with occasional PVCs, HR 70s    ECG: 1st degree heart block with PR of 224

## 2023-04-22 NOTE — Progress Notes
 RT Adult Assessment Note    NAME:Raven [name]             MRN: [id number]             DOB:April 12, 1946          AGE: 77 y.o.  ADMISSION DATE: 04/19/2023             DAYS ADMITTED: LOS: 0 days    Additional Comments:  Impressions of the patient: PT sitting in bed, NAD on RA  Intervention(s)/outcome(s): RT Eval performed    Vital Signs:  Pulse: 86  RR: 18 PER MINUTE  SpO2: 94 %  O2 Device: None (Room air)  Liter Flow:    O2%:      Breath Sounds:   All Breath Sounds: Clear (Implies normal);Decreased  Respiratory Effort:   Respiratory Effort/Pattern: Unlabored  Comments:

## 2023-04-22 NOTE — Progress Notes
 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 On arrival, she was hemodynamically stable and afebrile, saturating well on room air.  Her initial presentation was suggestive of being grossly fluid overloaded.  Cardiology was consulted. Admitted to medicine.      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 Notes that she was 175 when she weighed herself at home   -in the ED: dyspneic, normotensive, not hypoxic. Restless when trying to lay flat.   -NT-pro-BNP: 11636  -troponin 24 ->   - CXR: cardiomegaly with improved findings suggestive of CHF, small bilateral pleural effusions  -last echo May 2024: LVEF 30%, moderate-severe MR, severe TR  -received IV lasix 60mg  x1 in the ED  Plan:  - cardiology consulted, appreciate recs. Continuing lasix 60 mg IV BID   -Added Diuril - good urine output last 24 hours.   -Goal output of at least 2L/24 hours however appears does not have accurate I / o.     -May start lasix gtt if response continues to be poor - cards following.   -continue to hold off on resuming coreg   - continue PTA milrinone (0.3)   - daily standing weights (ordered)  - strict I/O   - 2000 mg daily Na restriction   - continue PT: apixiban, statin and amiodarone     Patient reports she does not feel comfortable with foley but would comply with urine collection via hat. Discussed with RN. Dc foley, continue strict monitoring of Is and Os.        robitussin dm for cough.        Hypokalemia:   - replace K     AKI on CKD stage 4  Chronic metabolic acidosis  -baseline creatinine ~ 2.0-2.2  -  presented with Cr of 3.2  -AKI likely secondary to cardiorenal syndrome  Plan:   - diuretics as above, will recheck with AM labs         Type 2 diabetes  -PTA jardiance and levemir. Holding  - A1c 6.3  Plan:  - ACHS +LDCF         COPD  Subcentimeter nodular opacities in lungs  -CT A/P 12/6: Tree-in-bud and multiple subcentimeter nodular opacities are   indeterminate.   Plan:  -Follow-up CT chest in 6-8 weeks recommended for reevaluation.   - Continue PTA symbicort.         Chronic anemia  Multiple Myeloma  -follows [facility], on outpatient daratumumab and darbepoetin  - Continue PTA acyclovir prophylaxis.      Hx of Diverticulosis  H/O melena and hematemesis  -12/2 EGD: 1.5 cm friable polyp in duodenum s/p clip x2, no bleeding seen   -12/8 EGD with ulcer in duodenum and visible vessel, no active bleeding.  Epinephrine injection utilized at pace with thermal therapy and mechanical therapy of Endo Clip placed.  Plan:   -Hgb: 9.6 - stable/improved since her last dc   - did need a blood transfusion during her last hospitalization   - will ctm with daily CBC     Pancreatic cyst  -Noted during previous admission  -CT A/P 12/6: Mild increase in size of indeterminate large lobulated cystic mass in the tail of pancreas. Endoscopic ultrasound recommended for further evaluation.   -Plan is for EUS in next month for cystic mass in pancreas, TBD               FEN:   Cardiac diet.     Prophylaxis Review:  Lines:  p iv  Urinary Catheter:  none  VTE: on AC    Code status: full code.     Disposition: continue inpatient care .  Case discussed with ncm, pharmacy, rn.       Murlean Caller M.D., FACP  Pager [phone]  ________________________________________________________________________    Subjective    Raven [name] is a 77 y.o. female.  Patient seen and examined. Feels well today. Tolerating diuresis. Eating. Patient denies chest pain, dyspnea, fever or chills.  Denies nausea, vomiting or diarrhea.            Objective                       Vital Signs: Last Filed                 Vital Signs: 24 Hour Range   BP: 121/55 (12/26 1638)  Temp: 37.1 ?C (98.7 ?F) (12/26 1638)  Pulse: 89 (12/26 1638)  Respirations: 20 PER MINUTE (12/26 1638)  SpO2: 97 % (12/26 1638)  O2%: 21 % (12/25 2119)  O2 Device: None (Room air) (12/26 1638) BP: (108-134)/(52-57)   Temp:  [36.1 ?C (96.9 ?F)-37.1 ?C (98.7 ?F)]   Pulse:  [68-89]   Respirations:  [18 PER MINUTE-20 PER MINUTE]   SpO2:  [93 %-98 %]   O2%:  [21 %]   O2 Device: None (Room air)         Intake/Output Summary:  (Last 24 hours)    Intake/Output Summary (Last 24 hours) at 04/22/2023 1848  Last data filed at 04/22/2023 1759  Gross per 24 hour   Intake 1716.88 ml   Output 975 ml   Net 741.88 ml       Physical Exam  Physical Exam  Constitutional:       General: She is not in acute distress.     Appearance: She is ill-appearing.   Cardiovascular:      Rate and Rhythm: Normal rate and regular rhythm.      Heart sounds: No murmur heard.  Pulmonary:      Effort: No respiratory distress.      Breath sounds: No stridor. No wheezing.   Abdominal:      General: There is no distension.      Palpations: There is no mass.      Tenderness: There is no abdominal tenderness.   Musculoskeletal:         General: No tenderness.      Comments: Improving lower ext edema.    Skin:     Coloration: Skin is not jaundiced.   Neurological:      Mental Status: She is alert and oriented to person, place, and time.           Medications  Scheduled IV PRN   amiodarone (CORDARONE) tablet 200 mg, 200 mg, Oral, QDAY  apixaban (ELIQUIS) tablet 5 mg, 5 mg, Oral, BID  budesonide-formoterol HFA (SYMBICORT) 160-4.5 mcg/actuation inhalation 2 puff, 2 puff, Inhalation, BID  CHOLEcalciferoL (vitamin D3) tablet 1,000 Units, 1,000 Units, Oral, QDAY  cyanocobalamin (vitamin B-12) tablet 1,000 mcg, 1,000 mcg, Oral, QDAY  ferrous sulfate (FEOSOL) tablet 325 mg, 325 mg, Oral, QDAY  furosemide (LASIX) injection 60 mg, 60 mg, Intravenous, BID(9-17)  insulin aspart (U-100) (NOVOLOG FLEXPEN U-100 INSULIN) injection PEN 0-6 Units, 0-6 Units, Subcutaneous, ACHS (22)  pantoprazole DR (PROTONIX) tablet 40 mg, 40 mg, Oral, BID  rosuvastatin (CRESTOR) tablet 10 mg, 10 mg, Oral, QDAY  sertraline (ZOLOFT) tablet 50 mg, 50 mg, Oral, QDAY      milrinone (PRIMACOR) 20 mg/D5W 100 mL infusion 0.3 mcg/kg/min (04/22/23 1759)    acetaminophen Q8H PRN, albuterol sulfate Q4H PRN, dextromethorphan/guaiFENesin  (ROBITUSSIN-DM) oral syrup Q4H PRN, dextrose 50% (D50) IV PRN, melatonin QHS PRN, nitroglycerin Q5 MIN PRN, ondansetron Q6H PRN **OR** ondansetron (ZOFRAN) IV Q6H PRN, polyethylene glycol 3350 QDAY PRN, sennosides-docusate sodium QDAY PRN       Lab Review  24-hour labs:    Results for orders placed or performed during the hospital encounter of 04/19/23 (from the past 24 hours)   POC GLUCOSE    Collection Time: 04/21/23  9:02 PM   Result Value Ref Range    Glucose, POC 168 (H) 70 - 100 mg/dL   POC GLUCOSE    Collection Time: 04/22/23  3:06 AM   Result Value Ref Range    Glucose, POC 181 (H) 70 - 100 mg/dL   CBC AND DIFF    Collection Time: 04/22/23  5:11 AM   Result Value Ref Range    White Blood Cells 8.20 4.50 - 11.00 10*3/uL    Red Blood Cells 2.90 (L) 4.00 - 5.00 10*6/uL    Hemoglobin 8.8 (L) 12.0 - 15.0 g/dL    Hematocrit 82.9 (L) 36.0 - 45.0 %    MCV 91.2 80.0 - 100.0 fL    MCH 30.2 26.0 - 34.0 pg    MCHC 33.1 32.0 - 36.0 g/dL    RDW 56.2 (H) 13.0 - 15.0 %    Platelet Count 303 150 - 400 10*3/uL    MPV 8.6 7.0 - 11.0 fL    Neutrophils 65 41 - 77 %    Lymphocytes 17 (L) 24 - 44 %    Monocytes  12 4 - 12 %    Eosinophils 6 (H) 0 - 5 %    Basophils 1 0 - 2 %    Absolute Neutrophil Count 5.40 1.80 - 7.00 10*3/uL    Absolute Lymph Count 1.40 1.00 - 4.80 10*3/uL    Absolute Monocyte Count 1.00 (H) 0.00 - 0.80 10*3/uL    Absolute Eosinophil Count 0.50 (H) 0.00 - 0.45 10*3/uL    Absolute Basophil Count 0.10 0.00 - 0.20 10*3/uL   BASIC METABOLIC PANEL    Collection Time: 04/22/23  5:11 AM   Result Value Ref Range    Sodium 142 137 - 147 mmol/L    Potassium 3.2 (L) 3.5 - 5.1 mmol/L    Chloride 107 98 - 110 mmol/L    Glucose 200 (H) 70 - 100 mg/dL    Blood Urea Nitrogen 36 (H) 7 - 25 mg/dL    Creatinine 1.61 (H) 0.40 - 1.00 mg/dL    Calcium 8.4 (L) 8.5 - 10.6 mg/dL    CO2 24 21 - 30 mmol/L    Anion Gap 11 3 - 12    Glomerular Filtration Rate (GFR) 14 (L) >60 mL/min   MAGNESIUM    Collection Time: 04/22/23  5:11 AM   Result Value Ref Range    Magnesium 1.9 1.6 - 2.6 mg/dL   POC GLUCOSE    Collection Time: 04/22/23  8:11 AM   Result Value Ref Range    Glucose, POC 151 (H) 70 - 100 mg/dL   POC GLUCOSE    Collection Time: 04/22/23 10:05 AM   Result Value Ref Range    Glucose, POC 100 70 - 100 mg/dL   POC GLUCOSE    Collection Time: 04/22/23 12:04 PM   Result Value Ref Range    Glucose, POC 134 (H) 70 - 100 mg/dL   POC GLUCOSE    Collection Time: 04/22/23  4:35 PM   Result Value Ref Range    Glucose, POC 137 (H) 70 - 100 mg/dL              Point of Care Testing  (Last 24 hours)  Glucose: (!) 200 (04/22/23 0511)  POC Glucose (Download): (!) 137 (04/22/23 1635)

## 2023-04-22 NOTE — Progress Notes
 Heart Failure Nursing Progress Note    Admission Date: 04/19/2023  LOS: 0 days    Admission Weight: 80.6 kg (177 lb 9.6 oz)        Most recent weights (inpatient):   Vitals:    04/19/23 1630 04/21/23 0500 04/22/23 0253   Weight: 80.3 kg (177 lb 0.5 oz) 79 kg (174 lb 2.6 oz) 76.8 kg (169 lb 6.4 oz)     Weight change from previous day: - 2.2 kg    Fluid restriction ordered: None    Intake/Output Summary: (Last 24 hours)    Intake/Output Summary (Last 24 hours) at 04/22/2023 1610  Last data filed at 04/22/2023 0454  Gross per 24 hour   Intake 675 ml   Output 1650 ml   Net -975 ml       Is patient incontinent No    Heart Failure Education provided: Yes     - Medications (including diuretics) reviewed during administration  - Low sodium diet discussed    Anticipated discharge date: TBD  Discharge goals: Diurese until goal dry weight        Daily Assessment of Patient Stated Goals:    Short Term Goal Identified by patient (Short Term=during hospitalization):  The pt will be able to teach back the importance of following a diet lower in sodium to avoid holding onto water weight

## 2023-04-22 NOTE — Progress Notes
 Heart Failure Nursing Progress Note    Admission Date: 04/19/2023  LOS: 0 days    Admission Weight: 80.6 kg (177 lb 9.6 oz)        Most recent weights (inpatient):   Vitals:    04/19/23 1630 04/21/23 0500 04/22/23 0253   Weight: 80.3 kg (177 lb 0.5 oz) 79 kg (174 lb 2.6 oz) 76.8 kg (169 lb 6.4 oz)     Weight change from previous day: -2.2kg    Fluid restriction ordered: 1.5L Fluid restriction    Intake/Output Summary: (Last 24 hours)    Intake/Output Summary (Last 24 hours) at 04/22/2023 1944  Last data filed at 04/22/2023 1900  Gross per 24 hour   Intake 1491.88 ml   Output 1275 ml   Net 216.88 ml       Is patient incontinent No    Heart Failure Education provided: Yes     - Medications (including diuretics) reviewed during administration  - Fluid restriction discussed  - Low sodium diet discussed    Anticipated discharge date: UTA  Discharge goals: Diurese      Daily Assessment of Patient Stated Goals:    Short Term Goal Identified by patient (Short Term=during hospitalization):  Diurese

## 2023-04-23 ENCOUNTER — Inpatient Hospital Stay
Admit: 2023-04-19 | Discharge: 2023-04-23 | Disposition: A | Discharge status: Home / Self Care | Payer: MEDICARE | Admitting: Student in an Organized Health Care Education/Training Program

## 2023-04-23 ENCOUNTER — Encounter: Admit: 2023-04-23 | Discharge: 2023-04-23 | Payer: MEDICARE

## 2023-04-23 ENCOUNTER — Inpatient Hospital Stay: Admit: 2023-04-23 | Discharge: 2023-04-23 | Payer: MEDICARE

## 2023-04-23 DIAGNOSIS — Z833 Family history of diabetes mellitus: Secondary | ICD-10-CM

## 2023-04-23 DIAGNOSIS — I272 Pulmonary hypertension, unspecified: Secondary | ICD-10-CM

## 2023-04-23 DIAGNOSIS — Z87891 Personal history of nicotine dependence: Secondary | ICD-10-CM

## 2023-04-23 DIAGNOSIS — Z79899 Other long term (current) drug therapy: Secondary | ICD-10-CM

## 2023-04-23 DIAGNOSIS — Z888 Allergy status to other drugs, medicaments and biological substances status: Secondary | ICD-10-CM

## 2023-04-23 DIAGNOSIS — K862 Cyst of pancreas: Secondary | ICD-10-CM

## 2023-04-23 DIAGNOSIS — Z8542 Personal history of malignant neoplasm of other parts of uterus: Secondary | ICD-10-CM

## 2023-04-23 DIAGNOSIS — Z7951 Long term (current) use of inhaled steroids: Secondary | ICD-10-CM

## 2023-04-23 DIAGNOSIS — N179 Acute kidney failure, unspecified: Secondary | ICD-10-CM

## 2023-04-23 DIAGNOSIS — I6522 Occlusion and stenosis of left carotid artery: Secondary | ICD-10-CM

## 2023-04-23 DIAGNOSIS — J4489 Other specified chronic obstructive pulmonary disease (CMS-HCC): Secondary | ICD-10-CM

## 2023-04-23 DIAGNOSIS — I252 Old myocardial infarction: Secondary | ICD-10-CM

## 2023-04-23 DIAGNOSIS — I493 Ventricular premature depolarization: Secondary | ICD-10-CM

## 2023-04-23 DIAGNOSIS — I130 Hypertensive heart and chronic kidney disease with heart failure and stage 1 through stage 4 chronic kidney disease, or unspecified chronic kidney disease: Secondary | ICD-10-CM

## 2023-04-23 DIAGNOSIS — I4819 Other persistent atrial fibrillation: Secondary | ICD-10-CM

## 2023-04-23 DIAGNOSIS — Z95810 Presence of automatic (implantable) cardiac defibrillator: Secondary | ICD-10-CM

## 2023-04-23 DIAGNOSIS — I255 Ischemic cardiomyopathy: Secondary | ICD-10-CM

## 2023-04-23 DIAGNOSIS — D630 Anemia in neoplastic disease: Secondary | ICD-10-CM

## 2023-04-23 DIAGNOSIS — I440 Atrioventricular block, first degree: Secondary | ICD-10-CM

## 2023-04-23 DIAGNOSIS — Z8041 Family history of malignant neoplasm of ovary: Secondary | ICD-10-CM

## 2023-04-23 DIAGNOSIS — Z8249 Family history of ischemic heart disease and other diseases of the circulatory system: Secondary | ICD-10-CM

## 2023-04-23 DIAGNOSIS — Z951 Presence of aortocoronary bypass graft: Secondary | ICD-10-CM

## 2023-04-23 DIAGNOSIS — E785 Hyperlipidemia, unspecified: Secondary | ICD-10-CM

## 2023-04-23 DIAGNOSIS — Z809 Family history of malignant neoplasm, unspecified: Secondary | ICD-10-CM

## 2023-04-23 DIAGNOSIS — E8722 Chronic metabolic acidosis: Secondary | ICD-10-CM

## 2023-04-23 DIAGNOSIS — Z736 Limitation of activities due to disability: Secondary | ICD-10-CM

## 2023-04-23 DIAGNOSIS — Z823 Family history of stroke: Secondary | ICD-10-CM

## 2023-04-23 DIAGNOSIS — Z955 Presence of coronary angioplasty implant and graft: Secondary | ICD-10-CM

## 2023-04-23 DIAGNOSIS — E1122 Type 2 diabetes mellitus with diabetic chronic kidney disease: Secondary | ICD-10-CM

## 2023-04-23 DIAGNOSIS — I340 Nonrheumatic mitral (valve) insufficiency: Secondary | ICD-10-CM

## 2023-04-23 DIAGNOSIS — E876 Hypokalemia: Secondary | ICD-10-CM

## 2023-04-23 DIAGNOSIS — N184 Chronic kidney disease, stage 4 (severe): Secondary | ICD-10-CM

## 2023-04-23 DIAGNOSIS — Z803 Family history of malignant neoplasm of breast: Secondary | ICD-10-CM

## 2023-04-23 DIAGNOSIS — C9000 Multiple myeloma not having achieved remission: Secondary | ICD-10-CM

## 2023-04-23 DIAGNOSIS — Z90710 Acquired absence of both cervix and uterus: Secondary | ICD-10-CM

## 2023-04-23 DIAGNOSIS — R570 Cardiogenic shock: Secondary | ICD-10-CM

## 2023-04-23 DIAGNOSIS — M4856XA Collapsed vertebra, not elsewhere classified, lumbar region, initial encounter for fracture: Secondary | ICD-10-CM

## 2023-04-23 DIAGNOSIS — I5084 End stage heart failure: Secondary | ICD-10-CM

## 2023-04-23 DIAGNOSIS — I480 Paroxysmal atrial fibrillation: Secondary | ICD-10-CM

## 2023-04-23 DIAGNOSIS — Z7901 Long term (current) use of anticoagulants: Secondary | ICD-10-CM

## 2023-04-23 DIAGNOSIS — I2510 Atherosclerotic heart disease of native coronary artery without angina pectoris: Secondary | ICD-10-CM

## 2023-04-23 DIAGNOSIS — K869 Disease of pancreas, unspecified: Secondary | ICD-10-CM

## 2023-04-23 LAB — POC GLUCOSE
~~LOC~~ BKR POC GLUCOSE: 158 mg/dL — ABNORMAL HIGH (ref 70–100)
~~LOC~~ BKR POC GLUCOSE: 172 mg/dL — ABNORMAL HIGH (ref 70–100)
~~LOC~~ BKR POC GLUCOSE: 99 mg/dL (ref 70–100)

## 2023-04-23 MED ORDER — DEXTROMETHORPHAN-GUAIFENESIN 10-100 MG/5 ML PO SYRP
10 mL | ORAL | 0 refills | 1.00000 days | Status: DC | PRN
Start: 2023-04-23 — End: 2023-05-14

## 2023-04-23 MED ORDER — POTASSIUM CHLORIDE 20 MEQ PO TBTQ
20 meq | ORAL_TABLET | Freq: Two times a day (BID) | ORAL | 0 refills | 30.00000 days | Status: DC
Start: 2023-04-23 — End: 2023-05-06
  Filled 2023-04-23: qty 30, 15d supply, fill #1

## 2023-04-23 MED ORDER — POTASSIUM CHLORIDE 20 MEQ PO TBTQ
60 meq | Freq: Once | ORAL | 0 refills | Status: CP
Start: 2023-04-23 — End: ?
  Administered 2023-04-23: 20:00:00 60 meq via ORAL

## 2023-04-23 MED ORDER — TORSEMIDE 20 MG PO TAB
20 mg | ORAL_TABLET | Freq: Two times a day (BID) | ORAL | 0 refills | 67.50000 days | Status: DC
Start: 2023-04-23 — End: 2023-05-06

## 2023-04-23 NOTE — Progress Notes
 END OF SHIFT/PLAN OF CARE NURSING NOTE    Admission Date: 04/19/2023  Length of Stay: LOS: 1 day    Patient demeanor and cognition: Calm and pleasant. Alert and oriented x4    Acute events, pain management, and nursing interventions: No acute events. IV diuresing with Milrinone continuously; Strict Is and Os; Standby asst. w/walker; ECHO scheduled for 125/27 am.       Communication with providers: N/A    Intake and Output:        Intake/Output Summary (Last 24 hours) at 04/23/2023 0736  Last data filed at 04/23/2023 1610  Gross per 24 hour   Intake 1161.88 ml   Output 2575 ml   Net -1413.12 ml        Last Bowel Movement Date: 04/21/23       Patient Education  Patient-specific education provided related to quality/safety:  CAUTI prevention, Fall risk, and Pressure injury care/prevention  Patient-specific education provided (other): Educated patient on diuretics  Cardiac patient-specific education provided: Diuretics education relating to HF  Learners: Patient  Method/materials used: Therapist, art  Response to learning: Some Evidence of Learning, Needs Reinforcement  Needs reinforcement on: Medications and dietary restrictions.      Patient Goal(s):  Patient will Maintain stable fluid volume with clear breath sounds and vital signs within normal limit by the end of shift         Patient will  Reach and maintain goal dry weight by discharge date   Other:    Restraints:  No  Restraint Goal: Patient will be free from injury while physically restrained.  See Docflowsheet for restraint documentation, interventions, education, etc.    Heart Failure Nursing Progress Note    Admission Date: 04/19/2023  LOS: 1 day    Admission Weight: 80.6 kg (177 lb 9.6 oz)        Most recent weights (inpatient):   Vitals:    04/21/23 0500 04/22/23 0253 04/23/23 0336   Weight: 79 kg (174 lb 2.6 oz) 76.8 kg (169 lb 6.4 oz) 76 kg (167 lb 9.6 oz)     Weight change from previous day: Pt lost 0.8 kg    Fluid restriction ordered: 1.5 L    Intake/Output Summary: (Last 24 hours)    Intake/Output Summary (Last 24 hours) at 04/23/2023 0741  Last data filed at 04/23/2023 9604  Gross per 24 hour   Intake 1161.88 ml   Output 2575 ml   Net -1413.12 ml       Is patient incontinent No    Heart Failure Education provided: Yes     - Medications (including diuretics) reviewed during administration  - Fluid restriction discussed  - Low sodium diet discussed    Anticipated discharge date: TBD  Discharge goals: Follow fluid and sodium restrictions      Daily Assessment of Patient Stated Goals:    Short Term Goal Identified by patient (Short Term=during hospitalization):  Revert to dry weight

## 2023-04-23 NOTE — Discharge Instructions - Appointments
 F/u with pcp 1-2 weeks of discharge  F/u with HF team outpatient

## 2023-04-25 ENCOUNTER — Encounter: Admit: 2023-04-25 | Discharge: 2023-04-25 | Payer: MEDICARE

## 2023-04-26 ENCOUNTER — Encounter: Admit: 2023-04-26 | Discharge: 2023-04-26 | Payer: MEDICARE

## 2023-04-26 NOTE — Telephone Encounter
Received call from [facility]  [phone] with [facility] asking to confirm medications since pt is taking coreg and metoprolol which are not on discharge summary. Called and informed DC summary is correct and pt should not be taking coreg or metoprolol.

## 2023-04-27 ENCOUNTER — Encounter: Admit: 2023-04-27 | Discharge: 2023-04-27 | Payer: MEDICARE

## 2023-04-27 ENCOUNTER — Emergency Department
Admit: 2023-04-27 | Discharge: 2023-04-27 | Payer: MEDICARE | Attending: Student in an Organized Health Care Education/Training Program

## 2023-04-27 ENCOUNTER — Inpatient Hospital Stay
Admit: 2023-04-27 | Discharge: 2023-05-06 | Disposition: A | Discharge status: Home Health | Payer: MEDICARE | Attending: Student in an Organized Health Care Education/Training Program

## 2023-04-27 DIAGNOSIS — K922 Gastrointestinal hemorrhage, unspecified: Secondary | ICD-10-CM

## 2023-04-27 DIAGNOSIS — K317 Polyp of stomach and duodenum: Secondary | ICD-10-CM

## 2023-04-27 DIAGNOSIS — K8689 Other specified diseases of pancreas: Secondary | ICD-10-CM

## 2023-04-27 DIAGNOSIS — K625 Hemorrhage of anus and rectum: Secondary | ICD-10-CM

## 2023-04-27 DIAGNOSIS — I10 Essential (primary) hypertension: Secondary | ICD-10-CM

## 2023-04-27 DIAGNOSIS — I480 Paroxysmal atrial fibrillation: Secondary | ICD-10-CM

## 2023-04-27 DIAGNOSIS — J449 Chronic obstructive pulmonary disease, unspecified: Secondary | ICD-10-CM

## 2023-04-27 DIAGNOSIS — R570 Cardiogenic shock: Secondary | ICD-10-CM

## 2023-04-27 DIAGNOSIS — E119 Type 2 diabetes mellitus without complications: Secondary | ICD-10-CM

## 2023-04-27 LAB — COMPREHENSIVE METABOLIC PANEL
~~LOC~~ BKR ALK PHOSPHATASE: 108 U/L (ref 25–110)
~~LOC~~ BKR ALT: 18 U/L (ref 7–56)
~~LOC~~ BKR ANION GAP: 10 (ref 3–12)
~~LOC~~ BKR AST: 29 U/L (ref 7–40)
~~LOC~~ BKR GLOMERULAR FILTRATION RATE (GFR): 21 mL/min — ABNORMAL LOW (ref >60)
~~LOC~~ BKR POTASSIUM: 3.8 mmol/L — ABNORMAL LOW (ref 3.5–5.1)
~~LOC~~ BKR SODIUM, SERUM: 142 mmol/L — ABNORMAL LOW (ref 137–147)

## 2023-04-27 LAB — COVID INFLUENZA A/B AND RSV PCR: ~~LOC~~ BKR RSV PCR: DETECTED — AB

## 2023-04-27 LAB — PROTIME INR (PT)
~~LOC~~ BKR INR: 3.1 — ABNORMAL HIGH (ref 0.9–1.2)
~~LOC~~ BKR PROTIME: 33 s — ABNORMAL HIGH (ref 10.2–12.9)

## 2023-04-27 LAB — POC GLUCOSE: ~~LOC~~ BKR POC GLUCOSE: 75 mg/dL (ref 70–100)

## 2023-04-27 LAB — HIGH SENSITIVITY TROPONIN I 2 HOUR: ~~LOC~~ BKR HIGH SENSITIVITY TROPONIN I 2 HOUR: 39 ng/L — ABNORMAL HIGH (ref <15)

## 2023-04-27 LAB — CBC: ~~LOC~~ BKR WBC COUNT: 8.4 10*3/uL (ref 4.50–11.00)

## 2023-04-27 LAB — HIGH SENSITIVITY TROPONIN I 0 HOUR: ~~LOC~~ BKR HIGH SENSITIVITY TROPONIN I 0 HOUR: 33 ng/L — ABNORMAL HIGH (ref 21–<15)

## 2023-04-27 MED ORDER — ALBUTEROL SULFATE 90 MCG/ACTUATION IN HFAA
2 | RESPIRATORY_TRACT | 0 refills | Status: DC | PRN
Start: 2023-04-27 — End: 2023-04-28

## 2023-04-27 MED ORDER — MELATONIN 5 MG PO TAB
5 mg | Freq: Every evening | ORAL | 0 refills | Status: DC | PRN
Start: 2023-04-27 — End: 2023-05-06
  Administered 2023-04-28 – 2023-05-06 (×5): 5 mg via ORAL

## 2023-04-27 MED ORDER — ONDANSETRON HCL (PF) 4 MG/2 ML IJ SOLN
4 mg | INTRAVENOUS | 0 refills | Status: DC | PRN
Start: 2023-04-27 — End: 2023-04-28

## 2023-04-27 MED ORDER — ROSUVASTATIN 20 MG PO TAB
10 mg | Freq: Every evening | ORAL | 0 refills | Status: DC
Start: 2023-04-27 — End: 2023-05-06
  Administered 2023-04-28 – 2023-05-06 (×9): 10 mg via ORAL

## 2023-04-27 MED ORDER — DEXTROSE 50 % IN WATER (D50W) IV SYRG
12.5-25 g | INTRAVENOUS | 0 refills | Status: DC | PRN
Start: 2023-04-27 — End: 2023-05-06

## 2023-04-27 MED ORDER — SENNOSIDES-DOCUSATE SODIUM 8.6-50 MG PO TAB
1 | Freq: Every day | ORAL | 0 refills | Status: DC | PRN
Start: 2023-04-27 — End: 2023-05-06
  Administered 2023-04-29: 16:00:00 1 via ORAL

## 2023-04-27 MED ORDER — PANTOPRAZOLE 40 MG PO TBEC
40 mg | Freq: Two times a day (BID) | ORAL | 0 refills | Status: DC
Start: 2023-04-27 — End: 2023-05-06
  Administered 2023-04-28 – 2023-05-06 (×18): 40 mg via ORAL

## 2023-04-27 MED ORDER — TORSEMIDE 20 MG PO TAB
20 mg | Freq: Two times a day (BID) | ORAL | 0 refills | Status: DC
Start: 2023-04-27 — End: 2023-04-28

## 2023-04-27 MED ORDER — CHOLECALCIFEROL (VITAMIN D3) 25 MCG (1,000 UNIT) PO TAB
1000 [IU] | Freq: Every day | ORAL | 0 refills | Status: DC
Start: 2023-04-27 — End: 2023-05-06
  Administered 2023-04-28 – 2023-05-06 (×10): 1000 [IU] via ORAL

## 2023-04-27 MED ORDER — INSULIN ASPART 100 UNIT/ML SC FLEXPEN
0-6 [IU] | Freq: Before meals | SUBCUTANEOUS | 0 refills | Status: DC
Start: 2023-04-27 — End: 2023-05-06
  Administered 2023-04-29: 18:00:00 1 [IU] via SUBCUTANEOUS

## 2023-04-27 MED ORDER — BENZONATATE 100 MG PO CAP
100 mg | Freq: Once | ORAL | 0 refills | Status: CP
Start: 2023-04-27 — End: ?
  Administered 2023-04-27: 22:00:00 100 mg via ORAL

## 2023-04-27 MED ORDER — PANTOPRAZOLE 40 MG IV SOLR
80 mg | Freq: Once | INTRAVENOUS | 0 refills | Status: CP
Start: 2023-04-27 — End: ?
  Administered 2023-04-27: 22:00:00 80 mg via INTRAVENOUS

## 2023-04-27 MED ORDER — ACYCLOVIR 400 MG PO TAB
400 mg | Freq: Two times a day (BID) | ORAL | 0 refills | Status: DC
Start: 2023-04-27 — End: 2023-04-28
  Administered 2023-04-28 (×2): 400 mg via ORAL

## 2023-04-27 MED ORDER — SERTRALINE 50 MG PO TAB
50 mg | Freq: Every day | ORAL | 0 refills | Status: DC
Start: 2023-04-27 — End: 2023-05-06
  Administered 2023-04-28 – 2023-05-06 (×9): 50 mg via ORAL

## 2023-04-27 MED ORDER — MILRINONE IN 5 % DEXTROSE 20 MG/100 ML (200 MCG/ML) IV PGBK
.3 ug/kg/min | INTRAVENOUS | 0 refills | Status: DC
Start: 2023-04-27 — End: 2023-05-06
  Administered 2023-04-28 – 2023-05-06 (×16): 0.3 ug/kg/min via INTRAVENOUS

## 2023-04-27 MED ORDER — BUMETANIDE 0.25 MG/ML IJ SOLN
2 mg | Freq: Two times a day (BID) | INTRAVENOUS | 0 refills | Status: DC
Start: 2023-04-27 — End: 2023-05-01
  Administered 2023-04-28 – 2023-04-30 (×6): 2 mg via INTRAVENOUS

## 2023-04-27 MED ORDER — BUDESONIDE-FORMOTEROL 160-4.5 MCG/ACTUATION IN HFAA
2 | Freq: Two times a day (BID) | RESPIRATORY_TRACT | 0 refills | Status: DC
Start: 2023-04-27 — End: 2023-05-06
  Administered 2023-04-28: 03:00:00 2 via RESPIRATORY_TRACT

## 2023-04-27 MED ORDER — AMIODARONE 200 MG PO TAB
200 mg | Freq: Every day | ORAL | 0 refills | Status: DC
Start: 2023-04-27 — End: 2023-05-06
  Administered 2023-04-28 – 2023-05-06 (×9): 200 mg via ORAL

## 2023-04-27 MED ORDER — POLYETHYLENE GLYCOL 3350 17 GRAM PO PWPK
1 | Freq: Every day | ORAL | 0 refills | Status: DC | PRN
Start: 2023-04-27 — End: 2023-05-06
  Administered 2023-05-02: 15:00:00 17 g via ORAL

## 2023-04-27 MED ORDER — FUROSEMIDE 10 MG/ML IJ SOLN
60 mg | Freq: Once | INTRAVENOUS | 0 refills | Status: CP
Start: 2023-04-27 — End: ?
  Administered 2023-04-28: 02:00:00 60 mg via INTRAVENOUS

## 2023-04-27 MED ORDER — ONDANSETRON 4 MG PO TBDI
4 mg | ORAL | 0 refills | Status: DC | PRN
Start: 2023-04-27 — End: 2023-04-28

## 2023-04-27 MED ORDER — FERROUS SULFATE 325 MG (65 MG IRON) PO TAB
325 mg | Freq: Every day | ORAL | 0 refills | Status: DC
Start: 2023-04-27 — End: 2023-05-06
  Administered 2023-04-28 – 2023-05-06 (×10): 325 mg via ORAL

## 2023-04-27 NOTE — ED Provider Notes
Raven [name] is a 77 y.o. female.    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  Patient reports she has been compliant with all her medication.  Currently being treated for RSV.  This morning she coughed and with each cough she lost stool that appeared melanotic.  She denies use of Pepto-Bismol or eating beets.  She reports baseline shortness of breath that increases with exertion.           Review of Systems:  Review of Systems   Constitutional:  Negative for fever.   HENT:  Negative for sore throat.    Eyes:  Negative for visual disturbance.   Respiratory:  Positive for shortness of breath.    Cardiovascular:  Negative for chest pain.   Gastrointestinal:  Positive for blood in stool. Negative for abdominal pain, nausea and vomiting.   Genitourinary:  Negative for dysuria and hematuria.   Musculoskeletal:  Negative for back pain.   Skin:  Negative for rash.   Neurological:  Negative for headaches.       Allergies:  Bortezomib, Allopurinol, and Sulfa (sulfonamide antibiotics)    Past Medical History:  Past Medical History:    Anxiety    Arthritis    Asthma    Asymptomatic stenosis of right carotid artery    Back pain    Cancer of uterus (HCC)    COPD (chronic obstructive pulmonary disease) (HCC)    Coronary artery disease    Diabetes mellitus (HCC)    Dyslipidemia    Flank mass    Fluid retention    GI bleed    Heart disease    Hypertension    Other malignant neoplasm without specification of site    Sleep apnea    Vision decreased       Past Surgical History:  Surgical History:   Procedure Laterality Date    ROBOT ASSISTED LOW ANTERIOR RESECTION N/A 08/10/2022    Performed by Benetta Spar, MD at [facility] OR    ESOPHAGOGASTRODUODENOSCOPY WITH BIOPSY - FLEXIBLE N/A 09/17/2022    Performed by Tempie Hoist, DO at [facility] ENDO    CATHETERIZATION RIGHT HEART N/A 09/18/2022    Performed by Cath, Physician at [facility] CATH LAB    REMOVAL AND REPLACEMENT IMPLANTABLE DEFIBRILLATOR GENERATOR - SINGLE LEAD SYSTEM Left 11/04/2022    Performed by Kathreen Cornfield, MD at [facility] EP LAB    INSERTION/ REPLACEMENT PERMANENT PACEMAKER WITH ATRIAL LEAD Left 11/04/2022    Performed by Kathreen Cornfield, MD at [facility] EP LAB    Injection Venography Extremity Left 11/04/2022    Performed by Kathreen Cornfield, MD at [facility] EP LAB    ESOPHAGOGASTRODUODENOSCOPY WITH BIOPSY - FLEXIBLE N/A 03/29/2023    Performed by Buckles, Vinnie Level, MD at [facility] ENDO    ESOPHAGOGASTRODUODENOSCOPY WITH SNARE REMOVAL TUMOR/ POLYP/ OTHER LESION - FLEXIBLE N/A 03/29/2023    Performed by Buckles, Vinnie Level, MD at [facility] ENDO    ESOPHAGOGASTRODUODENOSCOPY WITH CONTROL OF BLEEDING - FLEXIBLE N/A 04/04/2023    Performed by Buckles, Vinnie Level, MD at [facility] ENDO    BONE MARROW BIOPSY      CARDIAC DEFIBRILLATOR PLACEMENT      [facility]    CARDIAC SURGERY      CABG 3 V    CAROTID ENDARDECTOMY Right     COLONOSCOPY      HX CORONARY STENT PLACEMENT      HX HEART CATHETERIZATION      HX HYSTERECTOMY      TUNNELED VENOUS PORT PLACEMENT Right     Chest       Pertinent medical/surgical history reviewed    Social History:  Social History     Tobacco Use    Smoking status: Former     Current packs/day: 0.00     Average packs/day: 1 pack/day for 20.0 years (20.0 ttl pk-yrs)     Types: Cigarettes     Start date: 97     Quit date: 2010     Years since quitting: 15.0     Passive exposure: Never    Smokeless tobacco: Never   Vaping Use    Vaping status: Never Used   Substance Use Topics    Alcohol use: Not Currently    Drug use: Not Currently     Social History     Substance and Sexual Activity   Drug Use Not Currently             Family History:  Family History   Problem Relation Name Age of Onset    Cancer Mother      Diabetes Mother Hypertension Mother      Cancer-Breast Sister      Cancer-Ovarian Sister      Cancer Sister      Diabetes Sister      Heart Disease Sister      Hypertension Sister      Heart Disease Brother      Hypertension Brother      Stroke Brother         Vitals:  ED Vitals      Date and Time T BP P RR SPO2P SPO2 User   04/27/23 2200 -- 114/58 89 -- -- 95 % CS   04/27/23 2030 -- 123/82 89 -- -- 94 % CS   04/27/23 1928 -- -- 86 -- -- 93 % CS   04/27/23 1900 -- 139/78 85 -- -- 94 % CS   04/27/23 1800 -- 96/54 86 -- -- 95 % JD   04/27/23 1643 -- 111/63 83 23 PER MINUTE -- 96 % ML   04/27/23 1615 -- -- 81 24 PER MINUTE -- 94 % LK   04/27/23 1500 -- 99/73 85 17 PER MINUTE -- 90 % ML   04/27/23 1437 -- -- 86 19 PER MINUTE -- 93 % JD   04/27/23 1436 -- 101/64 94 20 PER MINUTE -- -- JD   04/27/23 1352 36.9 ?C (98.4 ?F) -- 92 23 PER MINUTE -- 92 % JD   04/27/23 1343 -- -- 94 -- -- 97 % JD   04/27/23 1342 -- 120/72 -- -- -- -- JD            Physical Exam:  Physical Exam  Vitals and nursing note reviewed.   Constitutional:       General: She is not in acute distress.     Appearance: Normal appearance. She is normal weight.   HENT:      Head: Normocephalic and atraumatic.   Eyes:      Extraocular Movements: Extraocular movements intact.      Conjunctiva/sclera: Conjunctivae normal.   Cardiovascular:      Rate and Rhythm: Normal  rate and regular rhythm.   Pulmonary:      Effort: Pulmonary effort is normal.      Breath sounds: Normal breath sounds.   Abdominal:      General: Bowel sounds are normal. There is no distension.      Palpations: Abdomen is soft.      Tenderness: There is no abdominal tenderness.   Genitourinary:     General: Normal vulva.      Rectum: Normal. Guaiac result negative.      Comments: No external or internal hemorrhoids noted on exam.  Musculoskeletal:         General: Normal range of motion.      Cervical back: Neck supple.   Neurological:      Mental Status: She is alert and oriented to person, place, and time. Mental status is at baseline.   Psychiatric:         Mood and Affect: Mood normal.         Behavior: Behavior normal.         Laboratory Results:  Labs Reviewed   COVID INFLUENZA A/B AND RSV PCR - Abnormal       Result Value Ref Range Status    Influenza A Virus Not Detected  Not Detected, Test Invalid Final    Influenza B Virus Not Detected  Not Detected, Test Invalid Final    RSV PCR Detected (*) Not Detected, Test Invalid Final    COVID-19 (SARS-CoV-2) PCR Not Detected  Not Detected Final   CBC - Abnormal    White Blood Cells 8.40  4.50 - 11.00 10*3/uL Final    Red Blood Cells 3.36 (*) 4.00 - 5.00 10*6/uL Final    Hemoglobin 9.5 (*) 12.0 - 15.0 g/dL Final    Hematocrit 16.1 (*) 36.0 - 45.0 % Final    MCV 90.8  80.0 - 100.0 fL Final    MCH 28.4  26.0 - 34.0 pg Final    MCHC 31.3 (*) 32.0 - 36.0 g/dL Final    RDW 09.6 (*) 04.5 - 15.0 % Final    Platelet Count 230  150 - 400 10*3/uL Final    MPV 8.7  7.0 - 11.0 fL Final   COMPREHENSIVE METABOLIC PANEL - Abnormal    Sodium 142  137 - 147 mmol/L Final    Potassium 3.8  3.5 - 5.1 mmol/L Final    Chloride 111 (*) 98 - 110 mmol/L Final    Glucose 84  70 - 100 mg/dL Final    Blood Urea Nitrogen 39 (*) 7 - 25 mg/dL Final    Creatinine 4.09 (*) 0.40 - 1.00 mg/dL Final    Calcium 7.5 (*) 8.5 - 10.6 mg/dL Final    Total Protein 5.1 (*) 6.0 - 8.0 g/dL Final    Total Bilirubin 0.6  0.2 - 1.3 mg/dL Final    Albumin 3.2 (*) 3.5 - 5.0 g/dL Final    Alk Phosphatase 108  25 - 110 U/L Final    AST 29  7 - 40 U/L Final    ALT 18  7 - 56 U/L Final    CO2 21  21 - 30 mmol/L Final    Anion Gap 10  3 - 12 Final    Glomerular Filtration Rate (GFR) 21 (*) >60 mL/min Final   NT-PRO-BNP - Abnormal    NT-Pro-BNP 12,625 (*) <450 pg/mL Final   HIGH SENSITIVITY TROPONIN I 0 HOUR - Abnormal    hs Troponin I 0  Hour 33 (*) <15 ng/L Final   HIGH SENSITIVITY TROPONIN I 2 HOUR - Abnormal    hs Troponin I 2 Hour 39 (*) <15 ng/L Final   PROTIME INR (PT) - Abnormal    Protime 33.0 (*) 10.2 - 12.9 Seconds Final    INR 3.1 (*) 0.9 - 1.2 Final   HIGH SENSITIVITY TROPONIN I 4 HR - Abnormal    hs Troponin I 4 Hour 40 (*) <15 ng/L Final    hs Troponin I 4-2hr Delta Value 1   Final   LIPASE - Normal    Lipase 15  11 - 82 U/L Final   MAGNESIUM - Normal    Magnesium 2.0  1.6 - 2.6 mg/dL Final   PHOSPHORUS - Normal    Phosphorus 4.0  2.0 - 4.5 mg/dL Final   POC GLUCOSE - Normal    Glucose, POC 75  70 - 100 mg/dL Final   HIGH SENSITIVITY TROPONIN PANEL    Narrative:     The following orders were created for panel order HIGH SENSITIVITY TROPONIN PANEL.  Procedure                               Abnormality         Status                     ---------                               -----------         ------                     HIGH SENSITIVITY TROPON.Marland KitchenMarland Kitchen[[id number]]  Abnormal            Final result               HIGH SENSITIVITY TROPON.Marland KitchenMarland Kitchen[[id number]]  Abnormal            Final result               HIGH SENSITIVITY TROPON.Marland KitchenMarland Kitchen[[id number]]  Abnormal            Final result                 Please view results for these tests on the individual orders.     POC Glucose (Download): 84    Radiology Interpretation:  CHEST SINGLE VIEW   Final Result         Similar cardiac silhouette enlargement with persistent perihilar and basal opacities and vascular indistinctness, favored to represent edema.      By my electronic signature, I attest that I have personally reviewed the images for this examination and formulated the interpretations and opinions expressed in this report          Finalized by Micki Riley, MD on 04/27/2023 4:40 PM. Dictated by Donnamarie Poag, MD on 04/27/2023 4:05 PM.          EKG:  ECG Results              ECG 12-LEAD (Final result)        Collection Time Result Time VT RATE P-R Interval QRS DURATION Q-T Interval QTC Calc Bazett    04/27/23 14:28:05 04/27/23 18:04:16 85 214 116 424 504             Collection Time  Result Time P Axis R Axis T Axis    04/27/23 14:28:05 04/27/23 18:04:16 74 115 47                     Final result                   Impression:    Sinus rhythm with 1st degree AV block with occasional premature ventricular complexes  Right axis deviation  Abnormal ECG  Confirmed by Virl Axe ([id number]) on 04/27/2023 6:04:11 PM                                    Medical Decision Making:  Cherine Pierman is a 77 y.o. female who presents with chief complaint as listed above. Based on the history and presentation, the list of differential diagnoses considered included, but was not limited to, hemorrhoids, diverticulitis, angiodysplasia, anal fissures.    ED Course  On arrival, patient is hemodynamically stable.  She is on milrinone drip.  Physical exam overall benign.  No abdominal tenderness.  Rectal exam without notable internal or external hemorrhoids or anal fissures.  Hemoccult negative.  Plan to obtain labs.  Will defer CT abdomen/pelvis as patient does not have any abdominal pain, nausea or vomiting.  Patient treated with Protonix.    Per chart review most recent EGD showed:  EGD: 04/04/23 At the D1-D2 junction there was the site of                                 previous polypectomy with one endoclip on it                                 and the other endoclip one fold distal to it.                                 At the tip of the post polypectomy site was an                                 ulcer with a visible vessel [facility]). There                                 was no active bleeding. Treated with                                 epinephrine injection at the base, thermal                                 therapy using gold probe and mechanical therapy                                 using one endoclip.     Laboratory evaluation with stable hemoglobin.  INR 3.1 no significant electrolyte abnormalities.  Creatinine at relative baseline.  No significant increase in BUN. Given patient  had recent GI bleed and is significantly comorbid I anticipate she will require admission for hemoglobin trending and GI consult.  Patient care transferred to Dr. Royetta Car pending labs.    On reassessment, patient remains hemodynamically stable.  She remains asymptomatic.  I discussed labs and pending troponin as well as recommended admission.  Patient and family agreeable with plan.    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    Additional data reviewed:    History was obtained from an independent historian: Family  Prior non-ED notes reviewed: Recent discharge summary and/or H&P and Clinic note  Independent interpretation of diagnostic tests was performed by me: Not in addition to what is mentioned above  Patient presentation/management was discussed with the following qualified health care professionals and/or other relevant professionals: Admitting Physician    Risk evaluation:    Diagnosis or treatment of patient condition impacted by social determinant of health: None  Tests Considered but not performed due to clinical scoring (if not mentioned in ED course, aside from what is implied by clinical scores listed):   Rationale regarding whether admission or escalation of care considered if not performed (if not mentioned in ED course, aside from what is implied by clinical scores listed):     ED Scoring:                                Facility Administered Meds:  Medications   amiodarone (CORDARONE) tablet 200 mg (has no administration in time range)   budesonide-formoterol HFA (SYMBICORT) 160-4.5 mcg/actuation inhalation 2 puff (2 puffs Inhalation Given 04/27/23 2053)   pantoprazole DR (PROTONIX) tablet 40 mg (40 mg Oral Given 04/27/23 2027)   rosuvastatin (CRESTOR) tablet 10 mg (10 mg Oral Given 04/27/23 2026)   CHOLEcalciferoL (vitamin D3) tablet 1,000 Units (1,000 Units Oral Given 04/27/23 2027)   ferrous sulfate (FEOSOL) tablet 325 mg (325 mg Oral Given 04/27/23 2025)   milrinone (PRIMACOR) 20 mg/D5W 100 mL infusion (has no administration in time range)   sertraline (ZOLOFT) tablet 50 mg (has no administration in time range)   sennosides-docusate sodium (SENOKOT-S) tablet 1 tablet (has no administration in time range)   polyethylene glycol 3350 (MIRALAX) packet 17 g (has no administration in time range)   melatonin tablet 5 mg (has no administration in time range)   bumetanide (BUMEX) injection 2 mg (has no administration in time range)   dextrose 50% (D50) syringe 25-50 mL (has no administration in time range)   insulin aspart (U-100) (NOVOLOG FLEXPEN U-100 INSULIN) injection PEN 0-6 Units (has no administration in time range)   benzonatate (TESSALON PERLES) capsule 100 mg (100 mg  Oral Given 04/27/23 1617)   pantoprazole (PROTONIX) injection 80 mg (80 mg Intravenous Given 04/27/23 1617)   furosemide (LASIX) injection 60 mg (60 mg Intravenous Given 04/27/23 2028)       Clinical Impression:  Clinical Impression   Rectal bleeding   Multiple myeloma, remission status unspecified (HCC)   Polyp of duodenum   Coronary artery disease involving native coronary artery of native heart without angina pectoris   Type 2 diabetes mellitus without complication, unspecified whether long term insulin use (HCC)   Chronic kidney disease, stage 4 (severe) (HCC)   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    Medications:  Current Discharge Medication List          Procedure Notes:  Procedures       Attestation / Supervision:        Seabron Spates, MD, MPH  Emergency Medicine, PGY-3  [facility]ach[facility] by [facility]

## 2023-04-28 ENCOUNTER — Encounter: Admit: 2023-04-28 | Discharge: 2023-04-28 | Payer: MEDICARE

## 2023-04-28 LAB — ECG 12-LEAD
P AXIS: 74 degrees — ABNORMAL LOW (ref 6.0–8.0)
P-R INTERVAL: 214 ms (ref 70–100)
Q-T INTERVAL: 424 ms — ABNORMAL HIGH (ref 0.40–1.00)
QRS DURATION: 116 ms — ABNORMAL HIGH (ref 7–25)
QTC CALCULATION (BAZETT): 504 ms — ABNORMAL LOW (ref 8.5–10.6)
R AXIS: 115 degrees (ref 0.2–1.3)
T AXIS: 47 degrees — ABNORMAL LOW (ref 3.5–5.0)
VENTRICULAR RATE: 85 {beats}/min — ABNORMAL HIGH (ref 98–110)

## 2023-04-28 LAB — TYPE & CROSSMATCH
~~LOC~~ BKR ANTIBODY SCREEN: POSITIVE
~~LOC~~ BKR UNITS ORDERED: 0

## 2023-04-28 LAB — POC GLUCOSE
~~LOC~~ BKR POC GLUCOSE: 141 mg/dL — ABNORMAL HIGH (ref 70–100)
~~LOC~~ BKR POC GLUCOSE: 108 mg/dL — ABNORMAL HIGH (ref 70–100)
~~LOC~~ BKR POC GLUCOSE: 163 mg/dL — ABNORMAL HIGH (ref 70–100)
~~LOC~~ BKR POC GLUCOSE: 80 mg/dL (ref 70–100)
~~LOC~~ BKR POC GLUCOSE: 84 mg/dL (ref 70–100)

## 2023-04-28 LAB — IRON + BINDING CAPACITY + %SAT+ FERRITIN
~~LOC~~ BKR % SATURATION: 17 % — ABNORMAL LOW (ref 28–42)
~~LOC~~ BKR FERRITIN: 950 ng/mL — ABNORMAL HIGH (ref 10–200)
~~LOC~~ BKR IRON BINDING: 148 ug/dL — ABNORMAL LOW (ref 270–380)
~~LOC~~ BKR IRON: 25 g/dL — ABNORMAL LOW (ref 50–160)
~~LOC~~ BKR TRANSFERRIN: 99 mg/dL — ABNORMAL LOW (ref 185–336)

## 2023-04-28 LAB — PHOSPHORUS: ~~LOC~~ BKR PHOSPHORUS: 4 mg/dL (ref 2.0–4.5)

## 2023-04-28 LAB — HIGH SENSITIVITY TROPONIN I 4 HR
~~LOC~~ BKR HI SEN TNI DELTA 4-2: 1
~~LOC~~ BKR HIGH SENSITIVITY TROPONIN I 4 HOUR: 40 ng/L — ABNORMAL HIGH (ref <15)

## 2023-04-28 MED ORDER — EUCALYPTUS-MENTHOL MM LOZG
1 | ORAL | 0 refills | Status: DC | PRN
Start: 2023-04-28 — End: 2023-05-06
  Administered 2023-04-28 – 2023-05-05 (×5): 1 via ORAL

## 2023-04-28 MED ORDER — BISACODYL 5 MG PO TBEC
10 mg | Freq: Once | ORAL | 0 refills | Status: AC | PRN
Start: 2023-04-28 — End: ?

## 2023-04-28 MED ORDER — ACYCLOVIR 400 MG PO TAB
200 mg | Freq: Two times a day (BID) | ORAL | 0 refills | Status: DC
Start: 2023-04-28 — End: 2023-05-06
  Administered 2023-04-29 – 2023-05-06 (×16): 200 mg via ORAL

## 2023-04-28 MED ORDER — BENZONATATE 100 MG PO CAP
100 mg | Freq: Three times a day (TID) | ORAL | 0 refills | Status: DC | PRN
Start: 2023-04-28 — End: 2023-05-06
  Administered 2023-04-28 – 2023-05-06 (×21): 100 mg via ORAL

## 2023-04-28 MED ORDER — DEXTROMETHORPHAN-GUAIFENESIN 10-100 MG/5 ML PO SYRP
10 mL | ORAL | 0 refills | Status: DC | PRN
Start: 2023-04-28 — End: 2023-05-06
  Administered 2023-04-29 – 2023-05-06 (×14): 10 mL via ORAL

## 2023-04-28 MED ORDER — IPRATROPIUM-ALBUTEROL 0.5 MG-3 MG(2.5 MG BASE)/3 ML IN NEBU
3 mL | RESPIRATORY_TRACT | 0 refills | Status: DC | PRN
Start: 2023-04-28 — End: 2023-05-01
  Administered 2023-05-01 (×2): 3 mL via RESPIRATORY_TRACT

## 2023-04-28 MED ORDER — PEG-ELECTROLYTE SOLN 420 GRAM PO SOLR
2 L | Freq: Two times a day (BID) | ORAL | 0 refills | Status: AC
Start: 2023-04-28 — End: ?
  Administered 2023-04-29: 20:00:00 2 L via ORAL

## 2023-04-28 MED ORDER — CEFDINIR 300 MG PO CAP
300 mg | Freq: Every day | ORAL | 0 refills | Status: DC
Start: 2023-04-28 — End: 2023-04-30
  Administered 2023-04-28 – 2023-04-30 (×3): 300 mg via ORAL

## 2023-04-28 MED ORDER — POTASSIUM CHLORIDE 20 MEQ PO TBTQ
40 meq | Freq: Two times a day (BID) | ORAL | 0 refills | Status: CP
Start: 2023-04-28 — End: ?
  Administered 2023-04-28 (×2): 40 meq via ORAL

## 2023-04-28 MED ORDER — PEG-ELECTROLYTE SOLN 420 GRAM PO SOLR
2 L | ORAL | 0 refills | Status: AC | PRN
Start: 2023-04-28 — End: ?

## 2023-04-28 NOTE — Progress Notes
RT Adult Assessment Note    NAME:Raven [name]             MRN: [id number]             DOB:1945-09-20          AGE: 78 y.o.  ADMISSION DATE: 04/27/2023             DAYS ADMITTED: LOS: 0 days    Additional Comments:  Impressions of the patient: Patient resting in bed, in no distress at this time. She states she has a history of COPD and takes Albuterol PRN at home, and Symbicort BID. She also states she wears a CPAP at home and will need a machine when she gets a room.  Intervention(s)/outcome(s): See RT Orders  Patient education that was completed: None at this time  Recommendations to the care team: Continue with Symbicort BID and get a CPAP once she gets a room, as well as patient continuing to do the OPEP on her own as she has been doing. As well as starting lung expansion via IS with nursing.     Vital Signs:  Pulse: 89  RR:    SpO2: 94 %  O2 Device: None (Room air)  Liter Flow:    O2%:      Breath Sounds:   All Breath Sounds: Coarse crackles;Decreased  Respiratory Effort:   Respiratory Effort/Pattern: Unlabored;SOA (Short of air)  Comments:

## 2023-04-28 NOTE — Progress Notes
General Medicine Daily Progress Note    Name: Raven [name]        MRN: [id number]          DOB: [birth year]            Age: 78 y.o.  Admission Date: 04/27/2023       LOS: 1 day    Date of Service: 04/28/2023    Assessment/Plan:    Principal Problem:    GI bleed    78 y.o.   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    Of note, this is Raven [name]'s eighth hospitalization at [facility] this year. Recurrent hospitalizations for HF exacerbation, GI bleeds.      Hematochezia  -Patient endorses 1 day history of bright red blood per rectum  -Hemoglobin on admission 9.5 which appears to be around baseline  -12/2 EGD: 1.5 cm friable polyp in duodenum s/p clip x2, no bleeding seen   -12/8 EGD with ulcer in duodenum and visible vessel, no active bleeding. Endo Clip placed.  -Patient currently hemodynamically stable  -Hematochezia possibly secondary to diverticular bleed versus angiodysplasia versus hemorrhoidal bleed  Plan  -Consult GI. No intervention at this time  -H&H every 12 hours-- stable in 9s   -Obtain type and cross  -Hold Eliquis      Cough  RSV infection  -Patient endorses nonproductive cough and shortness of breath  -Tested positive for RSV in the ED  -Chest x-ray revealed similar cardiac silhouette enlargement with persistent perihilar and basilar opacities and vascular destructiveness favored to represent edema  Plan  -Continue with supportive therapy for now. Patient on room air   -Cough suppressants added Acute on chronic combined systolic and diastolic heart failure  On chronic milrinone  Ischemic cardiomyopathy  CAD s/p CABG and PCI  CRT-D in situ  PAF  -Recent hospitalization 12/23-27 with HF exacerbation.   -Echo from 04/12/2023 revealed EF of 30% with grade 3 diastolic dysfunction and severe mitral and tricuspid valve regurgitation  -EKG revealed sinus rhythm with first-degree AV block and PVCs  -NT proBNP elevated at 12K. Has been >10k at baseline. Most recently 11,636 04/19/23  -Chest x-ray revealed pulmonary edema  -Suspect shortness of breath likely has a component of heart failure exacerbation  -PTA on torsemide 20 mg BID  Plan  -On admit, given. 60 mg IV Lasix x 1. HF following, awaiting recs. Continue IV Bumex 2 mg twice daily. Discussed with HF APP who will adjust after discussion with staff   -Daily weight and I&O  -Resume PTA milrinone  -Resume PTA amiodarone for PAF  -Holding Eliquis due to suspected GI bleed. Discussed with cardiology, may need to consider stopping eliquis with recurrent GI bleeds  -Interrogate device     CKD stage IV  -Baseline creatinine appears to be anywhere between 2.3-2.6  -On admission creatinine 2.36  Plan  -Monitor renal function closely while diuresing. 1/1 stable at 2.38  -Daily CMP     Multiple myeloma  -Follows at [facility] and currently on Daratumumab  -Continue PTA acyclovir for prophylaxis  -Follow-up with hematology as  outpatient     DM type II  -Most recent hemoglobin A1c from 04/20/2023 was 6.3  -Will start low-dose correction factor     COPD  Pulmonary nodules  -Continue PTA albuterol and Symbicort  -Follow-up for pulmonary nodules recommended after 6 to 8 weeks     Pancreatic cyst  -CT abdomen/pelvis from 04/02/2023 revealed mild increase in size of indeterminant large lobulated cystic mass in the tail of the pancreas  -Endoscopic ultrasound recommended  -Follow-up with GI as outpatient                    FEN: no IVF, electrolytes reviewed, DIET NPO Sips With Medications diet  Prophylaxis:  Hold due to concern for GI bleed  Disposition: Admit to medicine.    Code status: Full code (Discussed on 04/27/2023)         Sharmon Revere, MD  Hospitalist, Internal Medicine    Voalte is the preferred method of communication.   Please use the Med Private First Call for all patient-related communications. Personal Voaltes and pagers are not answered at all hours.    High medical decision making due to the following:  1 or more chronic illness with side effects due to treatment and 1 acute or chronic illness that poses a threat to life or bodily function  drug therapy requiring intensive monitoring for toxicity(IV diuretic in CKD)      _______________________________________________________________________    Subjective  Raven [name] is a 78 y.o. female.  No events overnight. Patient seen at bedside, having cough today but feels a little bit better in terms of breathing. She showed me a photo of her bloody stool at home. She is hungry but otherwise has no new concerns.     Medications  Scheduled Meds:acyclovir (ZOVIRAX) tablet 400 mg, 400 mg, Oral, BID  amiodarone (CORDARONE) tablet 200 mg, 200 mg, Oral, QDAY  budesonide-formoterol HFA (SYMBICORT) 160-4.5 mcg/actuation inhalation 2 puff, 2 puff, Inhalation, BID  bumetanide (BUMEX) injection 2 mg, 2 mg, Intravenous, BID(9-17)  cefdinir (OMNICEF) capsule 300 mg, 300 mg, Oral, QDAY  CHOLEcalciferoL (vitamin D3) tablet 1,000 Units, 1,000 Units, Oral, QDAY  ferrous sulfate (FEOSOL) tablet 325 mg, 325 mg, Oral, QDAY  insulin aspart (U-100) (NOVOLOG FLEXPEN U-100 INSULIN) injection PEN 0-6 Units, 0-6 Units, Subcutaneous, ACHS (22)  pantoprazole DR (PROTONIX) tablet 40 mg, 40 mg, Oral, BID  rosuvastatin (CRESTOR) tablet 10 mg, 10 mg, Oral, QHS  sertraline (ZOLOFT) tablet 50 mg, 50 mg, Oral, QDAY    Continuous Infusions:   milrinone (PRIMACOR) 20 mg/D5W 100 mL infusion 0.3 mcg/kg/min (04/28/23 0757)     PRN and Respiratory Meds:benzonatate TID PRN, dextrose 50% (D50) IV PRN, melatonin QHS PRN, polyethylene glycol 3350 QDAY PRN, sennosides-docusate sodium QDAY PRN        Objective:                          Vital Signs: Last Filed                 Vital Signs: 24 Hour Range   BP: 118/63 (01/01 0757)  Temp: 36.6 ?C (97.8 ?F) (01/01 0757)  Pulse: 90 (01/01 0757)  Respirations: 22 PER MINUTE (01/01 0757)  SpO2: 94 % (01/01 0757)  O2 Device: None (Room air) (01/01 0757) BP: (96-139)/(54-82)   Temp:  [36.6 ?C (97.8 ?F)-36.9 ?C (98.4 ?F)]   Pulse:  [81-100]   Respirations:  [17 PER MINUTE-24 PER MINUTE]   SpO2:  [  90 %-99 %]   O2 Device: None (Room air)     Vitals:    04/27/23 1352   Weight: 76.2 kg (168 lb)         Intake/Output Summary:  (Last 24 hours)    Intake/Output Summary (Last 24 hours) at 04/28/2023 0816  Last data filed at 04/28/2023 0253  Gross per 24 hour   Intake 118 ml   Output 250 ml   Net -132 ml           Physical Exam  Physical Exam  Vitals reviewed.   Constitutional:       General: She is not in acute distress.     Appearance: She is not toxic-appearing.   Cardiovascular:      Rate and Rhythm: Normal rate.   Pulmonary:      Breath sounds: Wheezing and rhonchi present.   Musculoskeletal:      Right lower leg: Edema present.      Left lower leg: Edema present.   Neurological:      Mental Status: She is alert and oriented to person, place, and time.   Psychiatric:         Mood and Affect: Mood normal.           Lab Review  Pertinent labs reviewed and noted in A/P.     Point of Care Testing  (Last 24 hours)  Glucose: 79 (04/28/23 0544)  POC Glucose (Download): 84 (04/28/23 0749)    Radiology and other Diagnostics Review:    Pertinent radiology reviewed and noted in A/P.

## 2023-04-28 NOTE — Consults
Gastroenterology Consult Note  Patient Name:Raven [name]           ZOX:[id number]  Admission Date: 04/27/2023  1:37 PM      Principal Problem:    GI bleed      History of Present Illness/Subjective:  Oreatha [place] is a 78 y.o. 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    Patient had recent admission for melena with EGD on 03/29/2023 showing 1.5 cm pedunculated polyp in D1 that was resected with hot snare and hemostatic clip placed.  She presented again with hematemesis after restarting Eliquis.  EGD on 04/04/2023 showed Forrest 2A ulcer at the post polypectomy site with Endo Clip in place.  Ulcer was injected with epinephrine and treated with thermal therapy using gold probe 1 Endo Clip.  She was on IV PPI for 72 hours supposed to be discharged on PPI twice daily for 8 weeks.  Since discharge patient has been doing well in her usual state of health. However, she developed upper respiratory symptoms of coughing postnasal drip this past week.  Over the past 24 hours patient reports that she had 1 episode of maroon-colored stools.  Patient reports every so often where she takes for laxatives she has similar colored stools has been going on for the past couple weeks.    Patient remains hemodynamically stable hemoglobin 9.1 from prior discharge of 8.6, BUN 42 creatinine 2.38 albumin 3.1 BNP 12,625 troponins 33,39, 40 RSV positive     Assessment/ Plan:  78 year old female with history of CKD, MI ischemic cardiomyopathy, car chronic cardiogenic shock found home ionotropic therapy, multiple myeloma and recent sigmoid colectomy presents with acute GI bleed with hematochezia. Potentially due to anastamotic site ulcer, diverticular bleed, avm, dieulafoy lesion.    04/02/2023 CT abdomen pelvis revealed distal colonic anastomosis mild distal colonic diverticulosis, mild increase in size of indeterminate large lobulated cystic mass in the pancreatic tail EUS recommended for further evaluation      Anemia  Hematochezia      Recommendations:  Continue to monitor for further signs and symptoms of GI bleed  Clear liquid diet on 04/29/2023  Split prep on 04/29/2023  N.p.o. at 2359 of 04/29/2023  EGD EUS colonoscopy on 04/30/2023 for further evaluation of hematochezia and large lobulated pancreatic cystic mass    Patient seen/discussed with Dr. Roby Lofts  Gastroenterology and Hepatology    -----------------------------  PMH:  Past Medical History:    Anxiety    Arthritis    Asthma    Asymptomatic stenosis of right carotid artery    Back pain    Cancer of uterus (HCC)    COPD (chronic obstructive pulmonary disease) (HCC)    Coronary artery disease    Diabetes mellitus (HCC)    Dyslipidemia    Flank mass    Fluid retention    GI bleed    Heart disease    Hypertension    Other malignant neoplasm without specification of site    Sleep apnea    Vision decreased       Current medications:  No current facility-administered medications on file prior to encounter.     Current Outpatient Medications on File Prior to Encounter   Medication Sig Dispense Refill    acetaminophen (TYLENOL) 325 mg tablet Take three tablets by mouth every 8 hours as needed for Pain.  Indications: pain 40 tablet 0    acyclovir (ZOVIRAX) 400 mg tablet Take one tablet by mouth twice daily.      albuterol sulfate (PROAIR HFA) 90 mcg/actuation HFA aerosol inhaler Inhale two puffs by mouth into the lungs every 6 hours as needed for Wheezing or Shortness of Breath.      amiodarone (CORDARONE) 200 mg tablet Take one tablet by mouth daily. Indications: prevention of recurrent atrial fibrillation 90 tablet 1    apixaban (ELIQUIS) 5 mg tablet Take one tablet by mouth twice daily. Indications: afib 60 tablet 0    budesonide-formoterol HFA (SYMBICORT) 160-4.5 mcg/actuation aerosol inhaler Inhale two puffs by mouth into the lungs twice daily. Indications: bronchospasm prevention with COPD 30.6 g 0    cholecalciferol (VITAMIN D) 1,000 units tablet Take one tablet by mouth daily.      cyanocobalamin (VITAMIN B-12) 1,000 mcg tablet Take 5,000 Units by mouth daily.      dexAMETHasone (DECADRON) 4 mg tablet Take two tablets by mouth every 7 days. Patient is taking 8mg  on Thursdays prior to chemo      dextromethorphan-guaiFENesin (ROBITUSSIN-DM) 10-100 mg/5 mL oral syrup Take 10 mL by mouth every 4 hours as needed. Indications: cough 120 mL 0    EMU OIL (BULK) MISC Use  as directed. Apply topically as needed      febuxostat (ULORIC) 40 mg tablet Take one-half tablet by mouth daily. Indications: taken on chemo days 45 tablet 0    ferrous sulfate (FEOSOL) 325 mg (65 mg iron) tablet Take one tablet by mouth daily. Take on an empty stomach at least 1 hour before or 2 hours after food.      milrinone lactate/D5W (MILRINONE IN D5W IV) Administer  through vein. 0.71mcg/kg/min intravenously continuous infusion. Dosing wt 78.3 kg      nitroglycerin (NITROSTAT) 0.4 mg tablet Place one tablet under tongue every 5 minutes as needed for Chest Pain.      pantoprazole DR (PROTONIX) 40 mg tablet Take one tablet by mouth twice daily. Indications: gastritis 112 tablet 0    potassium chloride SR (K-DUR) 20 mEq tablet Take one tablet by mouth twice daily. Indications: prevention of low potassium in the blood 30 tablet 0    rosuvastatin (CRESTOR) 20 mg tablet Take one-half tablet by mouth daily. Indications: excessive fat in the blood 90 tablet 0    sertraline (ZOLOFT) 50 mg tablet Take one tablet by mouth daily.      torsemide (DEMADEX) 20 mg tablet Take one tablet by mouth twice daily. Indications: accumulation of fluid resulting from chronic heart failure 30 tablet 0       PSH:  Surgical History:   Procedure Laterality Date    ROBOT ASSISTED LOW ANTERIOR RESECTION N/A 08/10/2022    Performed by Benetta Spar, MD at [facility] OR ESOPHAGOGASTRODUODENOSCOPY WITH BIOPSY - FLEXIBLE N/A 09/17/2022    Performed by Tempie Hoist, DO at [facility] ENDO    CATHETERIZATION RIGHT HEART N/A 09/18/2022    Performed by Cath, Physician at [facility] CATH LAB    REMOVAL AND REPLACEMENT IMPLANTABLE DEFIBRILLATOR GENERATOR - SINGLE LEAD SYSTEM Left 11/04/2022    Performed by Kathreen Cornfield, MD at [facility] EP LAB    INSERTION/ REPLACEMENT PERMANENT PACEMAKER WITH ATRIAL LEAD Left 11/04/2022    Performed by Kathreen Cornfield, MD at [facility] EP LAB    Injection Venography Extremity Left 11/04/2022    Performed by Kathreen Cornfield, MD at [facility] EP LAB    ESOPHAGOGASTRODUODENOSCOPY  WITH BIOPSY - FLEXIBLE N/A 03/29/2023    Performed by Buckles, Vinnie Level, MD at [facility] ENDO    ESOPHAGOGASTRODUODENOSCOPY WITH SNARE REMOVAL TUMOR/ POLYP/ OTHER LESION - FLEXIBLE N/A 03/29/2023    Performed by Buckles, Vinnie Level, MD at [facility] ENDO    ESOPHAGOGASTRODUODENOSCOPY WITH CONTROL OF BLEEDING - FLEXIBLE N/A 04/04/2023    Performed by Buckles, Vinnie Level, MD at [facility] ENDO    BONE MARROW BIOPSY      CARDIAC DEFIBRILLATOR PLACEMENT      [employer] [facility]    CARDIAC SURGERY      CABG 3 V    CAROTID ENDARDECTOMY Right     COLONOSCOPY      HX CORONARY STENT PLACEMENT      HX HEART CATHETERIZATION      HX HYSTERECTOMY      TUNNELED VENOUS PORT PLACEMENT Right     Chest       SH:  Social History     Socioeconomic History    Marital status: Widowed   Tobacco Use    Smoking status: Former     Current packs/day: 0.00     Average packs/day: 1 pack/day for 20.0 years (20.0 ttl pk-yrs)     Types: Cigarettes     Start date: 50     Quit date: 2010     Years since quitting: 15.0     Passive exposure: Never    Smokeless tobacco: Never   Vaping Use    Vaping status: Never Used   Substance and Sexual Activity    Alcohol use: Not Currently    Drug use: Not Currently       FH:  Family History   Problem Relation Name Age of Onset    Cancer Mother      Diabetes Mother      Hypertension Mother      Cancer-Breast Sister      Cancer-Ovarian Sister Cancer Sister      Diabetes Sister      Heart Disease Sister      Hypertension Sister      Heart Disease Brother      Hypertension Brother      Stroke Brother         Review of Systems:  10 point ROS negative except as above.  Please see HPI for additional pertinent documentation    Physical Exam:  Vitals:    04/27/23 2240 04/28/23 0122 04/28/23 0242 04/28/23 0757   BP: 114/62  123/66 118/63   BP Source: Arm, Right Upper  Arm, Right Upper Arm, Right Upper   Pulse: 100 88 87 90   Temp: 36.6 ?C (97.9 ?F)  36.6 ?C (97.8 ?F) 36.6 ?C (97.8 ?F)   SpO2: 97% 99% 95% 94%   O2 Device: None (Room air) CPAP/BiPAP CPAP/BiPAP None (Room air)   Weight:         Constitutional- Vitals above, no acute distress.   Head - Normocephalic, atraumatic.   Eyes -  EOMI grossly. No icterus or injection.   Ears, nose, mouth, throat- No oral ulcer or bleeding.   Neck - No swelling or tracheal deviation.   Respiratory - Symmetric chest rise, no increased work of breathing.  Cardiovascular - Peripheral pulses intact, no pedal edema.  Gastrointestinal - Soft, nondistended, nontender to palpation.  Skin - No exposed rash, lesion.  Neurologic - No CN deficit, normal fluid speech  Psychiatric - Judgement intact, thought content appropriate.    Labs/Imaging:  Pertient labs/imaging were reviewed on  initiation of progress note.

## 2023-04-28 NOTE — Consults
Heart Failure Consult    NAME:Raven [name]             MRN: [id number]                 DOB:1945-07-15          AGE: 78 y.o.  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    Raven [name] presented to the ED on 04/27/23 reporting rectal bleeding, had recently had dark jellylike stool, abdominal discomfort, fatigue, nausea, had recently been diagnosed with RSV therefore was also having coughing symptoms.  Labs revealed stable H&H of 9.5/30.5, NT proBNP 12,625, high-sensitivity troponin levels 33-39-40.  Chest x-ray suggestive of pulmonary edema.  Unfortunately Raven [name] has had 4 hospitalizations over the last month as outlined below; this is her 8th admission over the last 9 months.     Admitted from 11/27-12/4/24 with melena and anemia, received PRBCs and underwent EGD on 12/2 which revealed 1.5 cm pedunculated viable polyp in duodenum which was clipped, Aspirin and Plavix were discontinued and she remained on Eliquis for anticoagulation.  Admitted from 12/6-12/13/24 with hematemesis, melena, symptomatic anemia.  Although her Plavix and aspirin had been discontinued during prior admission, pt reported she continued taking both aspirin and Plavix along with her Eliquis.  She required x4 units of PRBCs. Repeat EGD on 12/8 showed ulcer in duodenum and visible vessel with no active bleeding which was injected with epinephrine and Endo Clip placed.  Admitted from 12/15-12/19/24 for decompensated heart failure, required IV diuresis. It was noted that her milrinone infusion had been off for unknown amount of time.  For ongoing anemia she required x1 unit of PRBCs.  Admitted from 12/23-12/27/24 after she presented to HF clinic reporting symptoms of shortness of breath, concerns of hypervolemia and ~10 lbs weight gain.  She received IV diuresis, had developed a cough which was later identified as RSV when she was seen by her local ED.    Recommendations 04/28/23:  Chronic Cardiogenic Shock: continue home milrinone infusion at 0.3 mcg/kg/min for inotrope support  Diuretics: agree with Bumex 2 mg IV BID-->  will assess response in AM    K+ 3.6 this AM; will give Kdur 40 mEq BID today   HFrEF GDMT: limited with CKD; previously on [name] which was recently dc'd, defer resumption with CrCl<20 mL/min  Paroxsymal AFib: Continue PTA Amiodarone 200 mg/day, PTA Eliquis remains on hold with concerns for GIB; consider watchman in outpatient setting  CAD: PTA Rosuvastatin 10 mg daily resumed  Hx GIB: H/H remains stable; monitor closely for signs of bleeding  Thank you for consult; will continue to follow along Ongoing:  BMP once a day. Mg level daily. Maintain K+ over 4.0 and Mg+ over 2.0   2000mg  sodium dietary restriction.  Fluid Restriction:1.5  Strict I/O. Goal output: net neg 2L/24 hour  Daily standing scale weight. Goal Dry Weight: 160 lbs     Please place a specimen in lab NT-proBNP order on day of discharge in effort to establish baseline value correlating with suspected euvolemia.    Primary team responsible for placing Post-Discharge Health System Appointment Request order set for Cardiology: Heart Failure appointment request within 24-48 hours of discharge. (Please do not place order any earlier in effort to reduce possible need for cancellation and rescheduling of visit).    Pt seen and examined; discussed with Dr. Wallie Char and primary team.    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  Ischemic Cardiomyopathy   Major Complications or Comorbidities [facility]): cardiogenic shock, acute/ acute on chronic systolic and/or diastolic heart failure, and cardiorenal syndrome (with documentation of CKD)  NYHA functional class IV (unable to carry on any physical activity without symptoms of HF, or symptoms of HF at rest),   ACC Stage D (refractory HF requiring specialized interventions).   She presents with signs of hypervolemia with Left  ventricular failure with signs of low flow state.    BNP Labs:   Lab Results   Component Value Date    NTPROBNP 12,625 (H) 04/27/2023    NTPROBNP 11,636 (H) 04/19/2023    NTPROBNP 11,324 (H) 04/12/2023      High Sensitivity Troponin:  Lab Results   Component Value Date    HSTROP0HR 33 (H) 04/27/2023    HSTROP2HR 39 (H) 04/27/2023    HSTROP4HR 40 (H) 04/27/2023    HSTROPDELT4 1 04/27/2023    HSTROPDELTA -2 04/19/2023    HIGHSTROPI 28 (H) 04/12/2023       Admission Weight: 76.2 kg (168 lb)        Most recent weights (inpatient):   Vitals:    04/27/23 1352   Weight: 76.2 kg (168 lb)        Strict I/Os:   net I/O for entire hospital stay: -132 mL  net for last 24 hrs: -132 mL  UOP last 24 hrs: 250 mL    PLAN:  Diuretic Therapy    Prior to admission dose Torsemide 20 mg BID + Kdur 20 mEq BID      Given on admission 12/31: IV Lasix 60 mg x 1      Daily Dosing 1/1: Bumex 2 mg IV BID     Guideline Directed Medical Therapy PTA Inpatient Changes   ACEI/ARB N/A-CKD IV    ARNI N/A-CKD IV    BB N/A-cardiogenic shock on home inotrope    SGLT-2 Inhibitor N/A- recently on [name] 25 mg/day which was dc'd during 12/15-12/19 admission Defer resumption with CrCl <20 mL/min  Mineralocorticoid Receptor Antagonist N/A-CKD IV    Hydralazine/Nitrate N/A    Ivabradine N/A    Heart Rhythm Management Therapy Yes (ICD) 04/12/23 interrogation stable, no recent events   Anticoagulation for Afib/flutter apixaban (Eliquis) 5 mg BID    Cardiac Rehab Evaluation for LVEF < or equal to 40% Yes; Date ordered: 04/12/23    7-Day post hospital follow up scheduled within 24-48 hours of discharge No Primary team to schedule a follow up using [facility]-Discharge [facility] Appointment Request Order Set for Cardiology: Heart Failure appt request within 24-48 hours of discharge        Testing/Procedures   04/12/23 Echo:  Eccentric hypertrophy of the left ventricle with severe dilatation of the left ventricular chamber  Left ventricular systolic function is moderately to severely impaired with an estimate ejection fraction of 30% and global hypokinesis  Right ventricle is moderately dilated with normal function  The left atrium is severely dilated  Pacemaker lead seen in the right atrium and right ventricle  Severe mitral valve insufficiency  Severe tricuspid valve insufficiency  No pericardial effusion  Severe pulmonary hypertension with estimated peak systolic pulmonary artery pressure of 73 mmHg     Heart Failure Remote Monitoring and/or Heart Failure Research Candidacy   Patient is not an appropriate candidate for any remote monitoring or heart failure research studies due to  end stage HF .    CAD  - 12/31 admit troponin 33>39>40, EKG without acute ischemic changes   - 2012 s/p CABG x3  - 2010 s/p PCI    - pt denies acute coronary symptoms   > cont statin therapy; Asa and Plavix recently dc'd with GIB    HLD  Lab Results   Component Value Date    CHOL 107 09/16/2022    TRIG 72 09/16/2022    HDL 50 09/16/2022    LDL 45 09/16/2022    VLDL 14 09/16/2022    NONHDLCHOL 57 09/16/2022   > cont PTA Rosuvastatin 10 mg/day    Paroxysmal AFib  - currently anticoagulated with Eliquis 5 mg BID   - cont PTA Amiodarone 200 mg/day   - recommend Watchman in outpatient setting     CKD stage IV  - 12/31 admit creat 2.36  - baseline creat ranges 2.4-2.6  Recent Labs     04/27/23  1409 04/28/23  0544   CR 2.36* 2.38*   BUN 39* 42*   NA 142 143   > avoid nephrotoxins, daily labs to follow    Chronic Anemia  Frequent GIB  - 12/31 admit H/H 9.5/30.5  Lab Results   Component Value Date    IRON 25 (L) 04/28/2023    PSAT 17 (L) 04/28/2023    TIBC 148 (L) 04/28/2023    FERRITIN 950 (H) 04/28/2023    > s/p IV Venofer 300 mg x1 on 03/31/23    Hx DM 2  - 04/20/23 HgbA1c 6.3  - no PTA meds; previously on [name] 25 mg daily, this was discontinued during recent admission   > managed per primary team     Pulmonary  - pt recently diagnosed with RSV  - 1/1 currently not requiring O2 however noted to have productive cough with coarse breath sounds  > supportive care    _____________________________________________________________________________  Subjective:   She currently complains of fatigue, shortness of breath, dyspnea on exertion, orthopnea, abdominal fullness, peripheral edema, and weight change. She has productive cough; reports she is producing mod amt of sputum.  She currently denies paroxysmal nocturnal dyspnea, lightheadedness, positional lightheadedness, near syncope, palpitations, and chest pain.    Review of Systems:  Constitutional: positive for fatigue  Respiratory: positive for cough, sputum, increased work of breathing, or dyspnea on exertion  Cardiovascular: positive for dyspnea, fatigue, orthopnea, lower extremity edema  Gastrointestinal: positive for decreased appetite  Genitourinary:negative      Past Medical History:    Anxiety    Arthritis    Asthma    Asymptomatic stenosis of right carotid artery    Back pain    Cancer of uterus (HCC)    COPD (chronic obstructive pulmonary disease) (HCC)    Coronary artery disease    Diabetes mellitus (HCC)    Dyslipidemia    Flank mass    Fluid retention    GI bleed    Heart disease    Hypertension    Other malignant neoplasm without specification of site    Sleep apnea    Vision decreased     Surgical History:   Procedure Laterality Date    ROBOT ASSISTED LOW ANTERIOR RESECTION N/A 08/10/2022    Performed by Benetta Spar, MD at [facility] OR    ESOPHAGOGASTRODUODENOSCOPY WITH BIOPSY - FLEXIBLE N/A 09/17/2022    Performed by Tempie Hoist, DO at [facility] ENDO    CATHETERIZATION RIGHT HEART N/A 09/18/2022    Performed by Cath, Physician at [facility] CATH LAB    REMOVAL AND REPLACEMENT IMPLANTABLE DEFIBRILLATOR GENERATOR - SINGLE LEAD SYSTEM Left 11/04/2022    Performed by Kathreen Cornfield, MD at [facility] EP LAB    INSERTION/ REPLACEMENT PERMANENT PACEMAKER WITH ATRIAL LEAD Left 11/04/2022    Performed by Kathreen Cornfield, MD at [facility] EP LAB    Injection Venography Extremity Left 11/04/2022    Performed by Kathreen Cornfield, MD at [facility] EP LAB    ESOPHAGOGASTRODUODENOSCOPY WITH BIOPSY - FLEXIBLE N/A 03/29/2023    Performed by Buckles, Vinnie Level, MD at [facility] ENDO    ESOPHAGOGASTRODUODENOSCOPY WITH SNARE REMOVAL TUMOR/ POLYP/ OTHER LESION - FLEXIBLE N/A 03/29/2023    Performed by Buckles, Vinnie Level, MD at [facility] ENDO    ESOPHAGOGASTRODUODENOSCOPY WITH CONTROL OF BLEEDING - FLEXIBLE N/A 04/04/2023    Performed by Buckles, Vinnie Level, MD at [facility] ENDO    BONE MARROW BIOPSY      CARDIAC DEFIBRILLATOR PLACEMENT      [employer]    CARDIAC SURGERY      CABG 3 V CAROTID ENDARDECTOMY Right     COLONOSCOPY      HX CORONARY STENT PLACEMENT      HX HEART CATHETERIZATION      HX HYSTERECTOMY      TUNNELED VENOUS PORT PLACEMENT Right     Chest     Family History   Problem Relation Name Age of Onset    Cancer Mother      Diabetes Mother      Hypertension Mother      Cancer-Breast Sister      Cancer-Ovarian Sister      Cancer Sister      Diabetes Sister      Heart Disease Sister      Hypertension Sister      Heart Disease Brother      Hypertension Brother      Stroke Brother       Social History     Socioeconomic History    Marital status: Widowed   Tobacco Use    Smoking status: Former  Current packs/day: 0.00     Average packs/day: 1 pack/day for 20.0 years (20.0 ttl pk-yrs)     Types: Cigarettes     Start date: 28     Quit date: 2010     Years since quitting: 15.0     Passive exposure: Never    Smokeless tobacco: Never   Vaping Use    Vaping status: Never Used   Substance and Sexual Activity    Alcohol use: Not Currently    Drug use: Not Currently        Objective:    Allergies:   Allergies   Allergen Reactions    Bortezomib RASH     Noted in Provider note 8/22 cancer treatment drug    Allopurinol HIVES    Sulfa (Sulfonamide Antibiotics) NAUSEA ONLY        Medications:  Scheduled Meds:acyclovir (ZOVIRAX) tablet 200 mg, 200 mg, Oral, BID  amiodarone (CORDARONE) tablet 200 mg, 200 mg, Oral, QDAY  budesonide-formoterol HFA (SYMBICORT) 160-4.5 mcg/actuation inhalation 2 puff, 2 puff, Inhalation, BID  bumetanide (BUMEX) injection 2 mg, 2 mg, Intravenous, BID(9-17)  cefdinir (OMNICEF) capsule 300 mg, 300 mg, Oral, QDAY  CHOLEcalciferoL (vitamin D3) tablet 1,000 Units, 1,000 Units, Oral, QDAY  ferrous sulfate (FEOSOL) tablet 325 mg, 325 mg, Oral, QDAY  insulin aspart (U-100) (NOVOLOG FLEXPEN U-100 INSULIN) injection PEN 0-6 Units, 0-6 Units, Subcutaneous, ACHS (22)  pantoprazole DR (PROTONIX) tablet 40 mg, 40 mg, Oral, BID  potassium chloride SR (K-DUR) tablet 40 mEq, 40 mEq, Oral, BID w/meals  rosuvastatin (CRESTOR) tablet 10 mg, 10 mg, Oral, QHS  sertraline (ZOLOFT) tablet 50 mg, 50 mg, Oral, QDAY    Continuous Infusions:   milrinone (PRIMACOR) 20 mg/D5W 100 mL infusion 0.3 mcg/kg/min (04/28/23 0757)     PRN and Respiratory Meds:albuterol-ipratropium Q4H PRN, benzonatate TID PRN, dextrose 50% (D50) IV PRN, eucalyptus-menthoL Q2H PRN, melatonin QHS PRN, polyethylene glycol 3350 QDAY PRN, sennosides-docusate sodium QDAY PRN    Medications Prior to Admission   Medication Sig Dispense Refill Last Dose/Taking    acetaminophen (TYLENOL) 325 mg tablet Take three tablets by mouth every 8 hours as needed for Pain. Indications: pain 40 tablet 0     acyclovir (ZOVIRAX) 400 mg tablet Take one tablet by mouth twice daily.       albuterol sulfate (PROAIR HFA) 90 mcg/actuation HFA aerosol inhaler Inhale two puffs by mouth into the lungs every 6 hours as needed for Wheezing or Shortness of Breath.       amiodarone (CORDARONE) 200 mg tablet Take one tablet by mouth daily. Indications: prevention of recurrent atrial fibrillation 90 tablet 1     apixaban (ELIQUIS) 5 mg tablet Take one tablet by mouth twice daily. Indications: afib 60 tablet 0     budesonide-formoterol HFA (SYMBICORT) 160-4.5 mcg/actuation aerosol inhaler Inhale two puffs by mouth into the lungs twice daily. Indications: bronchospasm prevention with COPD 30.6 g 0     cholecalciferol (VITAMIN D) 1,000 units tablet Take one tablet by mouth daily.       cyanocobalamin (VITAMIN B-12) 1,000 mcg tablet Take 5,000 Units by mouth daily.       dexAMETHasone (DECADRON) 4 mg tablet Take two tablets by mouth every 7 days. Patient is taking 8mg  on Thursdays prior to chemo       dextromethorphan-guaiFENesin (ROBITUSSIN-DM) 10-100 mg/5 mL oral syrup Take 10 mL by mouth every 4 hours as needed. Indications: cough 120 mL 0     EMU OIL (BULK) MISC Use  as directed. Apply topically as needed       febuxostat (ULORIC) 40 mg tablet Take one-half tablet by mouth daily. Indications: taken on chemo days 45 tablet 0     ferrous sulfate (FEOSOL) 325 mg (65 mg iron) tablet Take one tablet by mouth daily. Take on an empty stomach at least 1 hour before or 2 hours after food.       milrinone lactate/D5W (MILRINONE IN D5W IV) Administer  through vein. 0.92mcg/kg/min intravenously continuous infusion. Dosing wt 78.3 kg       nitroglycerin (NITROSTAT) 0.4 mg tablet Place one tablet under tongue every 5 minutes as needed for Chest Pain.       pantoprazole DR (PROTONIX) 40 mg tablet Take one tablet by mouth twice daily. Indications: gastritis 112 tablet 0     potassium chloride SR (K-DUR) 20 mEq tablet Take one tablet by mouth twice daily. Indications: prevention of low potassium in the blood 30 tablet 0     rosuvastatin (CRESTOR) 20 mg tablet Take one-half tablet by mouth daily. Indications: excessive fat in the blood 90 tablet 0     sertraline (ZOLOFT) 50 mg tablet Take one tablet by mouth daily.       torsemide (DEMADEX) 20 mg tablet Take one tablet by mouth twice daily. Indications: accumulation of fluid resulting from chronic heart failure 30 tablet 0                              Vital Signs:  Last Filed                Vital Signs: 24 Hour Range   BP: 122/61 (01/01 1245)  Temp: 36.4 ?C (97.6 ?F) (01/01 1245)  Pulse: 97 (01/01 1245)  Respirations: 22 PER MINUTE (01/01 1245)  SpO2: 92 % (01/01 1245)  O2 Device: None (Room air) (01/01 1245)  BP: (96-139)/(54-82)   Temp:  [36.4 ?C (97.6 ?F)-36.9 ?C (98.4 ?F)]   Pulse:  [81-100]   Respirations:  [17 PER MINUTE-24 PER MINUTE]   SpO2:  [90 %-99 %]   O2 Device: None (Room air)           Wt Readings from Last 10 Encounters:   04/27/23 76.2 kg (168 lb)   04/23/23 76 kg (167 lb 9.6 oz)   04/15/23 77.6 kg (171 lb)   04/06/23 75.9 kg (167 lb 5.3 oz)   03/26/23 74.2 kg (163 lb 9.3 oz)   03/19/23 77 kg (169 lb 12.8 oz)   02/16/23 75.3 kg (166 lb)   02/15/23 75.3 kg (166 lb)   01/04/23 75.2 kg (165 lb 12.8 oz)   12/14/22 74.6 kg (164 lb 6.4 oz)       Physical Exam:    General Appearance: no distress, debilitated  Neck Veins: JVP 10cm, HJR positive  Respiratory: Nonlabored respiratory pattern on room air, coarse breath sounds with scattered rhonchi throughout all lung fields  Cardiac Auscultation: Regular rhythm, S1, S2, no S3 or S4, grade ii/vi murmur  Lower Extremity Edema: +1 mm BLE  Abdominal Exam: soft, mildly distended, non-tender, bowel sounds normal  Orientation: clear historian, good insight  R CVC: dressing c/d/i    Laboratory Review:   CBC w/Diff    Lab Results   Component Value Date/Time    WBC 6.80 04/28/2023 05:44 AM    RBC 3.14 (L) 04/28/2023 05:44 AM    HGB 9.1 (L) 04/28/2023 05:44 AM  HCT 28.7 (L) 04/28/2023 05:44 AM    MCV 91.6 04/28/2023 05:44 AM    MCH 29.1 04/28/2023 05:44 AM    MCHC 31.7 (L) 04/28/2023 05:44 AM    RDW 24.3 (H) 04/28/2023 05:44 AM    PLTCT 209 04/28/2023 05:44 AM    MPV 8.4 04/28/2023 05:44 AM    Lab Results   Component Value Date/Time    NEUT 59 04/28/2023 05:44 AM    ANC 4.00 04/28/2023 05:44 AM    LYMA 23 (L) 04/28/2023 05:44 AM    ALC 1.60 04/28/2023 05:44 AM    MONA 12 04/28/2023 05:44 AM    AMC 0.80 04/28/2023 05:44 AM    EOSA 5 04/28/2023 05:44 AM    AEC 0.40 04/28/2023 05:44 AM    BASA 1 04/28/2023 05:44 AM    ABC 0.00 04/28/2023 05:44 AM         Chemistry    Lab Results   Component Value Date/Time    NA 143 04/28/2023 05:44 AM    K 3.6 04/28/2023 05:44 AM    CL 108 04/28/2023 05:44 AM    CO2 25 04/28/2023 05:44 AM    GAP 10 04/28/2023 05:44 AM    BUN 42 (H) 04/28/2023 05:44 AM    CR 2.38 (H) 04/28/2023 05:44 AM    GLU 79 04/28/2023 05:44 AM    MG 2.0 04/27/2023 02:09 PM    Lab Results   Component Value Date/Time    CA 8.1 (L) 04/28/2023 05:44 AM    PO4 4.0 04/27/2023 06:29 PM    ALBUMIN 3.1 (L) 04/28/2023 05:44 AM    TOTPROT 5.1 (L) 04/28/2023 05:44 AM    ALKPHOS 106 04/28/2023 05:44 AM    AST 21 04/28/2023 05:44 AM    ALT 19 04/28/2023 05:44 AM    TOTBILI 0.7 04/28/2023 05:44 AM    GFR 20 (L) 04/28/2023 05:44 AM            Renal Function    Lab Results   Component Value Date/Time    NA 143 04/28/2023 05:44 AM    K 3.6 04/28/2023 05:44 AM    CL 108 04/28/2023 05:44 AM    CO2 25 04/28/2023 05:44 AM    GAP 10 04/28/2023 05:44 AM    BUN 42 (H) 04/28/2023 05:44 AM    BUN 39 (H) 04/27/2023 02:09 PM    BUN 34 (H) 04/23/2023 04:03 AM    Lab Results   Component Value Date/Time    CR 2.38 (H) 04/28/2023 05:44 AM    CR 2.36 (H) 04/27/2023 02:09 PM    CR 2.69 (H) 04/23/2023 04:03 AM    GLU 79 04/28/2023 05:44 AM    CA 8.1 (L) 04/28/2023 05:44 AM    PO4 4.0 04/27/2023 06:29 PM    ALBUMIN 3.1 (L) 04/28/2023 05:44 AM        Lipid Profile INR   Lab Results   Component Value Date    CHOL 107 09/16/2022    TRIG 72 09/16/2022    HDL 50 09/16/2022    LDL 45 09/16/2022    VLDL 14 09/16/2022    NONHDLCHOL 57 09/16/2022         Lab Results   Component Value Date    INR 3.1 (H) 04/27/2023          04/27/23 Chest X-Ray:   Similar cardiac silhouette enlargement with persistent perihilar and basal opacities and vascular indistinctness, favored to represent edema.  Tele:  SR, HR 80s     04/27/23 ECG:   SR w/ 1st degree A/V block, occasional PVCs, HR 85 bpm, QTc 504

## 2023-04-28 NOTE — H&P (View-Only)
ADMISSION HISTORY AND PHYSICAL      Name:  Raven [name]                                             MRN:  [id number]   Admission Date:  04/27/2023    ASSESSMENT & PLAN     78 y.o.   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 Endo Clip placed.  -Patient currently hemodynamically stable  -Hematochezia possibly secondary to diverticular bleed versus angiodysplasia versus hemorrhoidal bleed  Plan  -Keep patient on clear liquid diet and n.p.o. after midnight  -Consult GI  -H&H every 12 hours  -Obtain type and cross  -Hold Eliquis until evaluated by GI    Cough  RSV infection  -Patient endorses nonproductive cough and shortness of breath  -Tested positive for RSV in the ED  -Chest x-ray revealed similar cardiac silhouette enlargement with persistent perihilar and basilar opacities and vascular destructiveness favored to represent edema  -Continue with supportive therapy for now    Acute on chronic combined systolic and diastolic heart failure  On chronic milrinone  Ischemic cardiomyopathy  CAD s/p CABG and PCI  CRT-D in situ  PAF  -Echo from 04/12/2023 revealed EF of 30% with grade 3 diastolic dysfunction and severe mitral and tricuspid valve regurgitation  -EKG revealed sinus rhythm with first-degree AV block and PVCs  -NT proBNP elevated at 12K  -Chest x-ray revealed pulmonary edema  -Suspect shortness of breath likely has a component of heart failure exacerbation  -PTA on torsemide 20 mg daily  -Will give 60 mg IV Lasix x 1  -Start Bumex 2 mg twice daily in the a.m.  -Daily weight and I&O  -Consult heart failure team  -Resume PTA milrinone  -Resume PTA amiodarone for PAF  -Hold Eliquis due to suspected GI bleed  -Interrogate device    CKD stage IV  -Baseline creatinine appears to be anywhere between 2.3-2.6  -On admission creatinine 2.36  -Monitor renal function closely while diuresing  -Daily CMP    Multiple myeloma  -Follows at [facility] and currently on Daratumumab  -Continue PTA acyclovir for prophylaxis  -Follow-up with hematology as outpatient    DM type II  -Most recent hemoglobin A1c from 04/20/2023 was 6.3  -Will start low-dose correction factor    COPD  Pulmonary nodules  -Continue PTA albuterol and Symbicort  -Follow-up for pulmonary nodules recommended after 6 to 8 weeks    Pancreatic cyst  -CT abdomen/pelvis from 04/02/2023 revealed mild increase in size of indeterminant large lobulated cystic mass in the tail of the pancreas  -Endoscopic ultrasound recommended  -Follow-up with GI as outpatient  FEN: no IVF, electrolytes reviewed, DIET NPO Sips With Medications diet  Prophylaxis:  Hold due to concern for GI bleed  Disposition: Admit to medicine.    Code status: Full code (Discussed on 04/27/2023)      ** I have discussed the management and  plan of care  with patient/family as listed above.     After 8 am today,  please page rounding physician or appropriate  team pager in regard of this patient.      Total Time Today was 75 minutes in the following activities: Preparing to see the patient, Obtaining and/or reviewing separately obtained history, Performing a medically appropriate examination and/or evaluation, Counseling and educating the patient/family/caregiver, Ordering medications, tests, or procedures, Referring and communication with other health care professionals (when not separately reported), Documenting clinical information in the electronic or other health record, and Independently interpreting results (not separately reported) and communicating results to the patient/family/caregiver     Norm Parcel, MD       04/27/2023     Internal Medicine, Nocturnist    8 PM - 8 AM  Pager:  [phone]             _______________________________________________________________________________________________________________________________________________    Chief Complaint:  Blood in stool, cough and shortness of breath    History of Present Illness     Raven [name] is a 78 y.o.  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   Patient states she had 1 day history of blood per rectum associated with cough.  She denied any hematemesis or melena.  She does endorse increased fatigue since the onset of hematochezia.  She does endorse a cough and shortness of breath and was recently diagnosed with RSV.  She has not had any fevers or chills.  She thinks she might have gained a few pounds but denies significant lower extremity edema.    Review of Systems   Review of Systems   Respiratory:  Positive for cough and shortness of breath. Negative for sputum production.    Cardiovascular:  Positive for leg swelling.   Gastrointestinal: Positive for blood in stool.   All other systems reviewed and are negative.    Past medical history    has a past medical history of Anxiety, Arthritis, Asthma, Asymptomatic stenosis of right carotid artery, Back pain, Cancer of uterus (HCC), COPD (chronic obstructive pulmonary disease) (HCC), Coronary artery disease, Diabetes mellitus (HCC), Dyslipidemia, Flank mass, Fluid retention, GI bleed (03/24/2023), Heart disease, Hypertension, Other malignant neoplasm without specification of site, Sleep apnea, and Vision decreased.     Past surgical history    has a past surgical history that includes heart catheterization; colonoscopy; Cardiac surgery; coronary stent placement; carotid endardectomy (Right); hysterectomy; bone marrow biopsy; Cardiac defibrillator placement; Tunneled venous port placement (Right); colectomy (N/A, 08/10/2022); Upper gastrointestinal endoscopy (N/A, 09/17/2022); Upper gastrointestinal endoscopy (N/A, 03/29/2023); Upper gastrointestinal endoscopy (N/A, 03/29/2023); and Upper gastrointestinal endoscopy (N/A, 04/04/2023).       Family history     Family History   Problem Relation Name Age of Onset   ? Cancer Mother     ?  Diabetes Mother     ? Hypertension Mother     ? Cancer-Breast Sister     ? Cancer-Ovarian Sister     ? Cancer Sister     ? Diabetes Sister     ? Heart Disease Sister     ? Hypertension Sister     ? Heart Disease Brother     ? Hypertension Brother     ? Stroke Brother           Social history     Social History     Socioeconomic History   ? Marital status: Widowed   Tobacco Use   ? Smoking status: Former     Current packs/day: 0.00     Average packs/day: 1 pack/day for 20.0 years (20.0 ttl pk-yrs)     Types: Cigarettes     Start date: 5     Quit date: 2010     Years since quitting: 15.0     Passive exposure: Never   ? Smokeless tobacco: Never   Vaping Use   ? Vaping status: Never Used   Substance and Sexual Activity   ? Alcohol use: Not Currently   ? Drug use: Not Currently Allergies   Bortezomib, Allopurinol, and Sulfa (sulfonamide antibiotics)    Medications     Current Facility-Administered Medications   Medication   ? acyclovir (ZOVIRAX) tablet 400 mg   ? albuterol sulfate (PROAIR HFA) inhaler 2 puff   ? [START ON 04/28/2023] amiodarone (CORDARONE) tablet 200 mg   ? budesonide-formoterol HFA (SYMBICORT) 160-4.5 mcg/actuation inhalation 2 puff   ? CHOLEcalciferoL (vitamin D3) tablet 1,000 Units   ? ferrous sulfate (FEOSOL) tablet 325 mg   ? melatonin tablet 5 mg   ? milrinone (PRIMACOR) 20 mg/D5W 100 mL infusion   ? pantoprazole DR (PROTONIX) tablet 40 mg   ? polyethylene glycol 3350 (MIRALAX) packet 17 g   ? rosuvastatin (CRESTOR) tablet 10 mg   ? sennosides-docusate sodium (SENOKOT-S) tablet 1 tablet   ? [START ON 04/28/2023] sertraline (ZOLOFT) tablet 50 mg   ? torsemide (DEMADEX) tablet 20 mg     Current Outpatient Medications   Medication Sig   ? acetaminophen (TYLENOL) 325 mg tablet Take three tablets by mouth every 8 hours as needed for Pain. Indications: pain   ? acyclovir (ZOVIRAX) 400 mg tablet Take one tablet by mouth twice daily.   ? albuterol sulfate (PROAIR HFA) 90 mcg/actuation HFA aerosol inhaler Inhale two puffs by mouth into the lungs every 6 hours as needed for Wheezing or Shortness of Breath.   ? amiodarone (CORDARONE) 200 mg tablet Take one tablet by mouth daily. Indications: prevention of recurrent atrial fibrillation   ? apixaban (ELIQUIS) 5 mg tablet Take one tablet by mouth twice daily. Indications: afib   ? budesonide-formoterol HFA (SYMBICORT) 160-4.5 mcg/actuation aerosol inhaler Inhale two puffs by mouth into the lungs twice daily. Indications: bronchospasm prevention with COPD   ? cholecalciferol (VITAMIN D) 1,000 units tablet Take one tablet by mouth daily.   ? cyanocobalamin (VITAMIN B-12) 1,000 mcg tablet Take 5,000 Units by mouth daily.   ? dexAMETHasone (DECADRON) 4 mg tablet Take two tablets by mouth every 7 days. Patient is taking 8mg  on Thursdays prior to chemo   ? dextromethorphan-guaiFENesin (ROBITUSSIN-DM) 10-100 mg/5 mL oral syrup Take 10 mL by mouth every 4 hours as needed. Indications: cough   ? EMU OIL (BULK) MISC Use  as directed. Apply topically as needed   ? febuxostat (ULORIC) 40 mg  tablet Take one-half tablet by mouth daily. Indications: taken on chemo days   ? ferrous sulfate (FEOSOL) 325 mg (65 mg iron) tablet Take one tablet by mouth daily. Take on an empty stomach at least 1 hour before or 2 hours after food.   ? milrinone lactate/D5W (MILRINONE IN D5W IV) Administer  through vein. 0.49mcg/kg/min intravenously continuous infusion. Dosing wt 78.3 kg   ? nitroglycerin (NITROSTAT) 0.4 mg tablet Place one tablet under tongue every 5 minutes as needed for Chest Pain.   ? pantoprazole DR (PROTONIX) 40 mg tablet Take one tablet by mouth twice daily. Indications: gastritis   ? potassium chloride SR (K-DUR) 20 mEq tablet Take one tablet by mouth twice daily. Indications: prevention of low potassium in the blood   ? rosuvastatin (CRESTOR) 20 mg tablet Take one-half tablet by mouth daily. Indications: excessive fat in the blood   ? sertraline (ZOLOFT) 50 mg tablet Take one tablet by mouth daily.   ? torsemide (DEMADEX) 20 mg tablet Take one tablet by mouth twice daily. Indications: accumulation of fluid resulting from chronic heart failure          Physical exam     BP: 123/82 (12/31 2030)  Temp: 36.9 ?C (98.4 ?F) (12/31 1352)  Pulse: 89 (12/31 2030)  Respirations: 23 PER MINUTE (12/31 1643)  SpO2: 94 % (12/31 2030)  O2 Device: None (Room air) (12/31 2053) BP: (96-139)/(54-82)   Temp:  [36.9 ?C (98.4 ?F)]   Pulse:  [81-94]   Respirations:  [17 PER MINUTE-24 PER MINUTE]   SpO2:  [90 %-97 %]   O2 Device: None (Room air)  Vitals:    04/27/23 1352   Weight: 76.2 kg (168 lb)          General: NAD, AAOx3,   Eyes: anicteric sclerae, EOMI. PERRL, pink conjunctiva  Ears, nose, mouth, and throat : MMM.   No pharyngeal erythema or exudate noted  Neck: Symmetric appearance without crepitus, no obvious mass or noticeable swelling.   Lungs: Scattered bilateral crackles  Cardiovascular: RRR, S1 &S2 normal, no murmurs, clicks, rubs, or gallops appreciated.  No edema in BLE.   Abdomen:  BS+, soft, no guarding or rigidity.  Nontender to palpation without palpable masses.  No rebound tenderness.   GU: No suprapubic or CVA tenderness.  No foley  Skin: Skin color normal, no obvious evidence of rashes.   Musculoskeletal: moves all ext.    Neurologic:  Alert and oriented . CN II-XII grossly intact. No focal weakness  Psych:  Calm, normal affect    Labs        Recent Labs     04/27/23  1409 04/27/23  1829   NA 142  --    K 3.8  --    CL 111*  --    CO2 21  --    BUN 39*  --    CR 2.36*  --    GAP 10  --    MG 2.0  --    PO4  --  4.0   GFR 21*  --    ALBUMIN 3.2*  --    TOTBILI 0.6  --    AST 29  --    ALT 18  --     Recent Labs     04/27/23  1409 04/27/23  1613   WBC 8.40  --    HGB 9.5*  --    HCT 30.5*  --    PLTCT 230  --  MCV 90.8  --    INR  --  3.1*          Radiology and Other Diagnostic Procedures Review     CHEST SINGLE VIEW    Result Date: 04/27/2023  Similar cardiac silhouette enlargement with persistent perihilar and basal opacities and vascular indistinctness, favored to represent edema. By my electronic signature, I attest that I have personally reviewed the images for this examination and formulated the interpretations and opinions expressed in this report  Finalized by Micki Riley, MD on 04/27/2023 4:40 PM. Dictated by Donnamarie Poag, MD on 04/27/2023 4:05 PM.

## 2023-04-29 ENCOUNTER — Encounter: Admit: 2023-04-29 | Discharge: 2023-04-29 | Payer: MEDICARE

## 2023-04-29 ENCOUNTER — Inpatient Hospital Stay: Admit: 2023-04-29 | Discharge: 2023-04-29 | Payer: MEDICARE

## 2023-04-29 DIAGNOSIS — I480 Paroxysmal atrial fibrillation: Secondary | ICD-10-CM

## 2023-04-29 LAB — POC GLUCOSE
~~LOC~~ BKR POC GLUCOSE: 125 mg/dL — ABNORMAL HIGH (ref 70–100)
~~LOC~~ BKR POC GLUCOSE: 127 mg/dL — ABNORMAL HIGH (ref 70–100)
~~LOC~~ BKR POC GLUCOSE: 138 mg/dL — ABNORMAL HIGH (ref 70–100)
~~LOC~~ BKR POC GLUCOSE: 183 mg/dL — ABNORMAL HIGH (ref 70–100)

## 2023-04-29 LAB — MAGNESIUM: ~~LOC~~ BKR MAGNESIUM: 2 mg/dL (ref 1.6–2.6)

## 2023-04-29 MED ORDER — PEG-ELECTROLYTE SOLN 420 GRAM PO SOLR
2 L | ORAL | 0 refills | Status: AC | PRN
Start: 2023-04-29 — End: ?

## 2023-04-29 MED ORDER — BISACODYL 5 MG PO TBEC
10 mg | ORAL | 0 refills | Status: AC | PRN
Start: 2023-04-29 — End: ?

## 2023-04-29 MED ORDER — PEG-ELECTROLYTE SOLN 420 GRAM PO SOLR
2 L | Freq: Two times a day (BID) | ORAL | 0 refills | Status: AC
Start: 2023-04-29 — End: ?
  Administered 2023-04-30: 02:00:00 2 L via ORAL

## 2023-04-29 NOTE — Progress Notes
OCCUPATIONAL THERAPY  ASSESSMENT NOTE      Name: Raven [name]   MRN: [id number]     DOB: 27-Jun-1945      Age: 78 y.o.  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 Presents to emergency department with complaints of hematochezia and cough admitted for further evaluation. Of note, this is Raven [name]'s 8th admission over the last 9 months.  Mental / Cognitive: Alert;Oriented;Cooperative;Follows commands  Pain: Complains of pain;Demonstrates non-verbal signs of pain;Does not rate pain  Pain level: Before activity;During activity;After activity  Pain Location: Abdomen  Pain Interventions: Patient agrees to participate in therapy with current pain level;Patient assisted into position of comfort;Nursing staff notified of patient's pain level  Comments: On entry, pt seated upright in bed. On exit, pt in bedside chair w/ alarm on and all needs within reach. RN notified.    Home Living Situation  Lives With: Family (son and daughter-in-law)  Type of Home: House  Entry Stairs: 2 (2 STE through garage w/ no rails)  Comments: Pt reports her son and DIL provide assistance when navigating stairs.  In-Home Stairs: 1 flight;Stair glide  Bathroom Setup: Tub/shower unit  Patient Owned Equipment: Bathing: Shower chair;Walker: Public house manager)  Comments: Pt uses RW within home and Art gallery manager for community distances.    Prior Level of Function  Level Of Independence: Independent with ADL and household mobility with device;Assist needed for IADL  Required Assist For: All home functioning ADL;Meal preparation;Medication/finance management;Stairs;Transportation/driving  Comments: Pt reports independence w/ ADLs (dressing, showering, toileting). Pt reports his son and DIL assist w/ IADLs (cooking, cleaning, laundry).  History of Falls in Past 3 Months: No    ADL's  Where Assessed: In Bathroom  LE Dressing Assist: Total Assist  LE Dressing Deficits: Don/Doff R Sock;Don/Doff L Sock  Toileting Assist: Minimal Assist  Toileting Deficits: Supervision/Safety;Increased Time To Complete;Grab Bar Use;Clothing Management Up;Clothing Management Down;Perineal Hygiene  Comment: Pt attempts to complete toileting ADL but unable defecate. Reports significant abdominal pain. Pt manages brief up/down over hips w/ min A and completes pericare w/ min A. Requires total assist to doff personal socks and don hospital socks.    ADL Mobility  Bed Mobility Comments: Pt seated upright in bed upon OT entry. Pt bracing abdomen due to reported pain and immediately requests to complete toileting ADL.  Transfer Type: Sit to stand  Transfer: Assistance Level: From;Bed;To/from;Toilet;Bedside chair;Minimal assist  Transfer: Assistive Device: Roller walker  Transfer: Type of Assistance: For balance;For safety considerations;For strength deficit  End of Activity Status: Up in chair;Instructed patient to request assist with mobility;Instructed patient to use call light;Nursing notified  Transfer Comments: Denies dizziness/lightheadedness throughout positional changes.  Sitting Balance: Static sitting balance;Standby assist  Standing Balance: Static standing balance;2 UE support;Minimal assist  Gait Distance: 10 feet (+ seated break + ~  3ft)  Gait: Assistance Level: Minimal assist;Management of lines;Safety considerations  Gait: Assistive Device: Roller walker  Gait Comments: Pt ambulates total of ~54ft (~81ft + seated break + ~32ft) w/ min A and RW. Stands w/ trunk in forward flexion and requires verbal cues to extend trunk. Noted slow gait pace. No LOB.    Activity Tolerance  Endurance: 2/5 Tolerates 10-20 Minutes Exercise w/Multiple Rests  Comment: Activity limited by pt pain and fatigue.    Cognition  Social Interaction: Interacts in a Network engineer  Attention: Awake/Alert    ROM  R UE ROM: WFL   R UE ROM Method: Active  L UE ROM: WFL   L UE ROM Method: Active  Grasp: Bilateral Grasp Functional for Activity    Edema  RUE Edema: No Significant Edema  LUE Edema: No Significant Edema  R Hand Edema: No Significant Edema  L Hand Edema: No Significant Edema    Sensory  Overall Sensory: Bilateral Intact    Education  Persons Educated: Patient  Barriers To Learning: Pain  Interventions:  (increased time)  Teaching Methods: Verbal Instruction  Patient Response: Verbalized Understanding  Topics: Role of OT, Goals for Therapy  Goal Formulation: With Patient    Assessment  Assessment: Decreased ADL Status;Decreased Endurance;Decreased Self-Care Trans;Decreased High-Level ADLs  Prognosis: Good;w/Cont OT s/p Acute Discharge  Goal Formulation: Patient  Comments: Pt is currently limited by significant abdominal pain, generalized weakness, and decreased activity tolerance. OT will continue to follow to maximize independence and safety with ADLs and functional mobility throughout acute admission.     AM-PAC 6 Clicks Daily Activity Inpatient  Putting on and taking off regular lower body clothes: A Lot  Bathing (Including washing, rinsing, drying): A Lot  Toileting, which includes using toilet, bedpan, or urinal: A Little  Putting on and taking off regular upper body clothing: A Little  Taking care of personal grooming such as brushing teeth: A Little  Eating meals: None  Daily Activity Raw Score: 17  Standardized (T-scale) Score: 37.26    Plan  OT Frequency: 2-3x/week  OT Plan for Next Visit: standing ADLs at sink; LB dressing; increase endurance    ADL Goals  Patient Will Perform Grooming: Standing at Sink;w/ Minimum Assist  Patient Will Perform LE Dressing: At [facility] of Bed;w/ Minimum Assist    Functional Transfer Goals  Pt Will Perform All Functional Transfers: w/ Stand By Assist    OT Discharge Recommendations  Recommendation: Inpatient setting  Patient Currently Requires Physical Assist With: All personal care ADLs;All home functioning ADLs;Ambulation;Transfers  Patient Currently Requires Supervision For: ADLs;Mobility    Therapist: Burnadette Pop, OTD, OTR/L  Date: 04/29/2023

## 2023-04-29 NOTE — Progress Notes
Home Infusion Transfer Summary - to Receiving Agency:  [facility]    Learned on 04/29/2023 that patient was hospitalized on 04/27/2023 for GI bleed.    [facility] has been providing the following treatment to the patient:    Drug:  milrinone 0.3 mcg/kg/min at 78.3 kg IV via continuous infusion        Administration method:  CADD [employer] ambulatory pump  IV Access:  SL CVC/SL Port  Last dressing change:  n/a  Labs last drawn:  04/23/23 during previous admission  Nursing provided by:  [facility]; Ph: [phone]  Last visit:  unknown     Progress toward goals: Patient has been on service with [facility] for home inotrope since May 2024. Patient recently discharged from [facility] on 04/23/23. Patient has had multiple hospitalizations over the past few months. Milrinone infuses via CVC and port is utilized for lab draws by [facility] RN.     Community Resources:  Home health listed above    We will be able to resume care when patient is prepared to return home and once we receive physician orders specific to the care that is to resume.      Submitted by:  Milbert Coulter, PharmD Date/time:  04/29/2023 9:00am      [facility] - Phone: [phone]; Fax: [phone]

## 2023-04-29 NOTE — Progress Notes
HC5 END OF SHIFT/PLAN OF CARE NURSING NOTE   Nursing Shift: Night Shift 1900-0700    Acute events, nursing interventions, & communication with providers: patient coughing overnight. PRN meds given.       Patient Goal(s)  Patient will Report breathing has improved by the end of next shift.        Patient will  Report breathing has improved by discharge.   Admission Weight: Weight: 76.2 kg (168 lb)    Last 3 Weights:   Vitals:    04/27/23 1352 04/28/23 1923 04/29/23 0316   Weight: 76.2 kg (168 lb) 76.5 kg (168 lb 9.6 oz) 75.8 kg (167 lb)     Weight Change: Weight trend stable    Intake/Output Summary (Last 24 hours) at 04/29/2023 0723  Last data filed at 04/29/2023 0600  Gross per 24 hour   Intake 700 ml   Output 500 ml   Net 200 ml     Last Bowel Movement Date: 04/26/24    Fluid Restriction? No   Quality/Safety    Total Fall Risk Score: 13   Risk for Injury related to falls: Coagulopathies/risk for bleed  Fall Risk Category:   History of More Than One Fall Within 6 Months Before Admission: No  Elimination, Bowel and Urine: N/A  Interventions: Use of bladder management device (e.g., female/female external urinary containment device)   Medications: On 2 or more high fall risk drugs  Interventions: Use of gait belt   Patient Care Equipment: Two present  Interventions: N/A - Does not score as risk in this category  Mobility: 2 - Unsteady gait, 2 - Assistance required  Interventions: Assist x1 and Utilize walker, cane, or additional walking aid for ambulation  Cognition: 0 - No cognition issues  Interventions: Bed/Chair Alarm     Other safety precautions in place: N/A    Restraints:  No      Patient Education  This RN provided education to Patient today. The following education topics were reviewed:  Quality/Safety Education:   Fall risk  Medication Education:   Cardiac medication(s)  Education provided on the following medication(s): milrinone  Cardiac - Specific Education:   Heart failure management  General Education: Bowel/Urinary elimination    The following teaching method(s) were used: Verbal  Response to learning: [employer]  Needs reinforcement on: NA

## 2023-04-29 NOTE — Progress Notes
Contin[facility] of Care    Multi Visit Patient - Progress Note      NAME: Blenda Rawles MRN: [id number]  DOB: [birth year]  AGE: 78 y.o.    Utilization: ED Visits: 7 Admissions: 8 within the past 12 months    Plan: Collaborate     MVP Intervention:  SWCM reached out to Cay Schillings, International aid/development worker, to start collaboration on pt's frequent high readmissions.     Kandee Keen, LMSW  Available on [place]  Office [phone]

## 2023-04-29 NOTE — Progress Notes
Heart Failure Progress Note    NAME:Raven [name]             MRN: [id number]                 DOB:01-14-1946          AGE: 78 y.o.  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 She follows with Dr. Herma Carson. [name] and [name], APRN.     She presented to the ED on 04/27/23 reporting rectal bleeding, had recently had dark jellylike stool, abdominal discomfort, fatigue, nausea, had recently been diagnosed with RSV therefore was also having coughing symptoms.  Labs revealed stable H&H of 9.5/30.5, NT proBNP 12,625, high-sensitivity troponin levels 33-39-40.  Chest x-ray suggestive of pulmonary edema. Unfortunately Raven [name] has had 4 hospitalizations over the last month as outlined below; this is her 8th admission over the last 9 months.     Admitted from 11/27-12/4/24 with melena and anemia, received PRBCs and underwent EGD on 12/2 which revealed 1.5 cm pedunculated viable polyp in duodenum which was clipped, Aspirin and Plavix were discontinued and she remained on Eliquis for anticoagulation.  Admitted from 12/6-12/13/24 with hematemesis, melena, symptomatic anemia.  Although her Plavix and aspirin had been discontinued during prior admission, pt reported she continued taking both aspirin and Plavix along with her Eliquis.  She required x4 units of PRBCs. Repeat EGD on 12/8 showed ulcer in duodenum and visible vessel with no active bleeding which was injected with epinephrine and Endo Clip placed.  Admitted from 12/15-12/19/24 for decompensated heart failure, required IV diuresis. It was noted that her milrinone infusion had been off for unknown amount of time.  For ongoing anemia she required x1 unit of PRBCs.  Admitted from 12/23-12/27/24 after she presented to HF clinic reporting symptoms of shortness of breath, concerns of hypervolemia and ~10 lbs weight gain.  She received IV diuresis, had developed a cough which was later identified as RSV when she was seen by her local ED.    Recommendations 04/29/23:  Chronic cardiogenic shock: continue home milrinone infusion at 0.3 mcg/kg/min  Diuretics: continue Bumex 2 mg IV  BID   HFrEF GDMT: limited with CKD and hx cardiogenic shock. Toprol XL stopped during hospitalization 12/23-12/27. Will not re-add at this time. Previously on [name] which was recently dc'd, defer resumption at this time with GFR 20  Paroxsymal AFib: Continue PTA Amiodarone 200 mg daily, PTA Eliquis remains on hold with concerns for GIB. Messaged Watchman navigator 04/29/23 to schedule appointment to discuss Watchman outpatient.   CAD: continue PTA Rosuvastatin 10 mg daily  Hx GIB: H/H remains stable; monitor closely for signs of bleeding. GI following. Per chart review plan for EGD/colonoscopy 1/3.  We will continue to follow.     Ongoing:  BMP once a day. Mg level daily. Maintain K+ over 4.0 and Mg+ over 2.0   2000mg  sodium dietary restriction.  Fluid Restriction:1.5  Strict I/O. Goal output: net neg 2L/24 hour  Daily standing scale weight. Goal Dry Weight: 165 lbs     Please place a specimen in lab NT-proBNP order on day of discharge in effort to establish baseline value correlating with suspected euvolemia.    Primary team responsible for placing [facility] Appointment Request order set for Cardiology: Heart Failure appointment request within 24-48 hours of discharge. (Please do not place order any earlier in effort to reduce possible need for cancellation and rescheduling of visit).    Discussed with Dr. Jeannette Corpus.    Jenna Luo, APRN-NP  Available on Voalte  Heart Failure Consult Service     Assessment:   Acute on chronic systolic and diastolic HFrEF,  EF: 30%  Ischemic Cardiomyopathy   Major Complications or Comorbidities [facility]): cardiogenic shock, acute/ acute on chronic systolic and/or diastolic heart failure, and cardiorenal syndrome (with documentation of CKD)  NYHA functional class IV (unable to carry on any physical activity without symptoms of HF, or symptoms of HF at rest),   ACC Stage D (refractory HF requiring specialized interventions).   She presents with signs of hypervolemia with Left  ventricular failure with signs of low flow state.    BNP Labs:   Lab Results   Component Value Date    NTPROBNP 12,625 (H) 04/27/2023    NTPROBNP 11,636 (H) 04/19/2023    NTPROBNP 11,324 (H) 04/12/2023      High Sensitivity Troponin:  Lab Results   Component Value Date    HSTROP0HR 33 (H) 04/27/2023    HSTROP2HR 39 (H) 04/27/2023    HSTROP4HR 40 (H) 04/27/2023    HSTROPDELT4 1 04/27/2023    HSTROPDELTA -2 04/19/2023    HIGHSTROPI 28 (H) 04/12/2023     Admission Weight: 76.2 kg (168 lb)        Most recent weights (inpatient):   Vitals:    04/27/23 1352 04/28/23 1923 04/29/23 0316   Weight: 76.2 kg (168 lb) 76.5 kg (168 lb 9.6 oz) 75.8 kg (167 lb)        Strict I/O: inaccurate    PLAN:  Diuretic Therapy    Prior to admission dose Torsemide 20 mg BID + Kdur 20 mEq BID      Given on admission 12/31: IV Lasix 60 mg x 1      Daily Dosing 1/1-1/2: Bumex 2 mg IV BID     Guideline Directed Medical Therapy PTA Inpatient Changes   ACEI/ARB N/A-CKD IV    ARNI N/A-CKD IV    BB N/A-cardiogenic shock on home inotrope; previously tolerated Toprol XL which was discharged during most recent hospitalization.  Will not resume at this time   SGLT-2 Inhibitor N/A- recently  on [name] 25 mg/day which was dc'd during 12/15-12/19 admission Defer resumption with CrCl <20 mL/min   Mineralocorticoid Receptor Antagonist N/A-CKD IV    Hydralazine/Nitrate N/A, BP well controlled    Ivabradine N/A, cardiogenic shock    Heart Rhythm Management Therapy Yes (ICD) 04/12/23 interrogation stable, no recent events   Anticoagulation for Afib/flutter apixaban (Eliquis) 5 mg BID Held on admission with concern for GIB   Cardiac Rehab Evaluation for LVEF < or equal to 40% Yes; Date ordered: 04/12/23    7-Day post hospital follow up scheduled within 24-48 hours of discharge No Primary team to schedule a follow up using [facility] Appointment Request Order Set for Cardiology: Heart Failure appt request within 24-48 hours of discharge      Inotrope Therapy Dose & Adjustments  (document symptom and hemodynamic response if pertinent)   Milrinone 0.3 mcg/kg/min Started during hospitalization May 2024     Testing/Procedures   04/12/23 Echo:  Eccentric hypertrophy of the left ventricle with severe dilatation of the left ventricular chamber  Left ventricular systolic function is moderately to severely impaired with an estimate ejection fraction of 30% and global hypokinesis  Right ventricle is moderately dilated with normal function  The left atrium is severely dilated  Pacemaker lead seen in the right atrium and right ventricle  Severe mitral valve insufficiency  Severe tricuspid valve insufficiency  No pericardial effusion  Severe pulmonary hypertension with estimated peak systolic pulmonary artery pressure of 73 mmHg     Heart Failure Remote Monitoring and/or Heart Failure Research Candidacy   Patient is not an appropriate candidate for any remote monitoring or heart failure research studies due to  end stage HF .    CAD s/p CABG x3 (2010)  - 12/31 admit troponin 33>39>40, EKG without acute ischemic changes   > cont statin therapy; aspirin and Plavix recently dc'd with GIB    S/p ICD  - device evaluation 04/12/23 AP <1%/VP <1%    HLD  Lab Results   Component Value Date    CHOL 107 09/16/2022    TRIG 72 09/16/2022    HDL 50 09/16/2022    LDL 45 09/16/2022    VLDL 14 09/16/2022    NONHDLCHOL 57 09/16/2022   > cont PTA Rosuvastatin 10 mg/day    Paroxysmal AFib  - PTA anticoagulated with Eliquis 5 mg BID held on admission  - cont PTA Amiodarone 200 mg/day  - recommend evaluation for Watchman in outpatient setting     CKD stage IV  - 12/31 admit creat 2.36  - baseline creat ranges 2.4-2.6  Lab Results   Component Value Date    CR 2.39 (H) 04/29/2023    CR 2.38 (H) 04/28/2023    CR 2.36 (H) 04/27/2023   > avoid nephrotoxins, daily labs to follow    Chronic Anemia  Frequent GIB  - 12/31 admit H/H 9.5/30.5  Lab Results   Component Value Date    IRON 25 (L) 04/28/2023    PSAT 17 (L) 04/28/2023    TIBC 148 (L) 04/28/2023    FERRITIN 950 (H) 04/28/2023    > s/p IV Venofer 300 mg x1 on 03/31/23  - GI consulted and following    Hx DM 2  - 04/20/23 HgbA1c 6.3  - no PTA meds; previously on [name] 25 mg daily, this was discontinued during recent admission   > managed per primary team     RSV  - supportive care per primary team  _____________________________________________________________________________  Subjective:   She currently complains of fatigue, shortness of breath, dyspnea on exertion, orthopnea, abdominal fullness, and peripheral edema.     She currently denies paroxysmal nocturnal dyspnea, lightheadedness, positional lightheadedness, palpitations, and chest pain.    Review of Systems:  A comprehensive review of systems was negative except for: above    Objective:                       Vital Signs:  Last Filed                Vital Signs: 24 Hour Range   BP: 138/119 (01/02 0316)  Temp: 36.7 ?C (98.1 ?F) (01/02 1610)  Pulse: 101 (01/02 0316)  Respirations: 18 PER MINUTE (01/02 0316)  SpO2: 98 % (01/02 0316)  O2%: 21 % (01/01 2159)  O2 Device: None (Room air) (01/02 0316)  Height: 157.5 cm (5' 2) (01/01 1923)  BP: (118-138)/(61-119)   Temp:  [36.4 ?C (97.6 ?F)-36.8 ?C (98.3 ?F)]   Pulse:  [85-101]   Respirations:  [18 PER MINUTE-22 PER MINUTE]   SpO2:  [92 %-99 %]   O2%:  [21 %]   O2 Device: None (Room air)           Wt Readings from Last 10 Encounters:   04/29/23 75.8 kg (167 lb)   04/23/23 76 kg (167 lb 9.6 oz)   04/15/23 77.6 kg (171 lb)   04/06/23 75.9 kg (167 lb 5.3 oz)   03/26/23 74.2 kg (163 lb 9.3 oz)   03/19/23 77 kg (169 lb 12.8 oz)   02/16/23 75.3 kg (166 lb)   02/15/23 75.3 kg (166 lb)   01/04/23 75.2 kg (165 lb 12.8 oz)   12/14/22 74.6 kg (164 lb 6.4 oz)     Physical Exam:    General Appearance: no distress  Neck Veins: JVP 10cm, HJR positive (unchanged; she does have severe TR)  Respiratory: Non-labored respiratory pattern on room air, coarse breath sounds with scattered rhonchi throughout all lung fields  Cardiac Auscultation: Regular rhythm, S1, S2, no S3 or S4, grade 2/6 murmur  Lower Extremity Edema: +1 mm BLE (unchanged)  Abdominal Exam: soft, mildly distended, non-tender, bowel sounds normal  Orientation: clear historian, good insight    Laboratory Review:   CBC w/Diff    Lab Results   Component Value Date/Time    WBC 8.00 04/29/2023 03:23 AM    RBC 3.28 (L) 04/29/2023 03:23 AM    HGB 9.4 (L) 04/29/2023 03:23 AM    HCT 29.5 (L) 04/29/2023 03:23 AM    MCV 90.1 04/29/2023 03:23 AM    MCH 28.7 04/29/2023 03:23 AM    MCHC 31.9 (L) 04/29/2023 03:23 AM    RDW 23.4 (H) 04/29/2023 03:23 AM    PLTCT 236 04/29/2023 03:23 AM    MPV 8.9 04/29/2023 03:23 AM    Lab Results   Component Value Date/Time    NEUT 61 04/29/2023 03:23 AM    ANC 4.90 04/29/2023 03:23 AM    LYMA 23 (L) 04/29/2023 03:23 AM    ALC 1.80 04/29/2023 03:23 AM    MONA 12 04/29/2023 03:23 AM    AMC 1.00 (H) 04/29/2023 03:23 AM    EOSA 4 04/29/2023 03:23 AM    AEC 0.30 04/29/2023 03:23 AM    BASA 1 04/29/2023 03:23 AM    ABC 0.00 04/29/2023 03:23 AM         Chemistry    Lab Results  Component Value Date/Time    NA 143 04/29/2023 03:23 AM    K 5.0 04/29/2023 03:23 AM    CL 108 04/29/2023 03:23 AM    CO2 25 04/29/2023 03:23 AM    GAP 10 04/29/2023 03:23 AM    BUN 43 (H) 04/29/2023 03:23 AM    CR 2.39 (H) 04/29/2023 03:23 AM    GLU 134 (H) 04/29/2023 03:23 AM    MG 2.0 04/27/2023 02:09 PM    Lab Results   Component Value Date/Time    CA 8.5 04/29/2023 03:23 AM    PO4 4.0 04/27/2023 06:29 PM    ALBUMIN 3.3 (L) 04/29/2023 03:23 AM    TOTPROT 5.4 (L) 04/29/2023 03:23 AM    ALKPHOS 108 04/29/2023 03:23 AM    AST 19 04/29/2023 03:23 AM    ALT 17 04/29/2023 03:23 AM    TOTBILI 0.9 04/29/2023 03:23 AM    GFR 20 (L) 04/29/2023 03:23 AM            Renal Function    Lab Results   Component Value Date/Time    NA 143 04/29/2023 03:23 AM    K 5.0 04/29/2023 03:23 AM    CL 108 04/29/2023 03:23 AM    CO2 25 04/29/2023 03:23 AM    GAP 10 04/29/2023 03:23 AM    BUN 43 (H) 04/29/2023 03:23 AM    BUN 42 (H) 04/28/2023 05:44 AM    BUN 39 (H) 04/27/2023 02:09 PM    Lab Results   Component Value Date/Time    CR 2.39 (H) 04/29/2023 03:23 AM    CR 2.38 (H) 04/28/2023 05:44 AM    CR 2.36 (H) 04/27/2023 02:09 PM    GLU 134 (H) 04/29/2023 03:23 AM    CA 8.5 04/29/2023 03:23 AM    PO4 4.0 04/27/2023 06:29 PM    ALBUMIN 3.3 (L) 04/29/2023 03:23 AM        Lipid Profile INR   Lab Results Component Value Date    CHOL 107 09/16/2022    TRIG 72 09/16/2022    HDL 50 09/16/2022    LDL 45 09/16/2022    VLDL 14 09/16/2022    NONHDLCHOL 57 09/16/2022         Lab Results   Component Value Date    INR 3.1 (H) 04/27/2023          04/27/23 Chest X-Ray:   Similar cardiac silhouette enlargement with persistent perihilar and basal opacities and vascular indistinctness, favored to represent edema.      Tele:  SR, PVC, HR mostly 80s     04/27/23 ECG:   SR w/ 1st degree A/V block, occasional PVCs, HR 85 bpm, QTc 504

## 2023-04-29 NOTE — Progress Notes
General Medicine Daily Progress Note    Name: Raven [name]        MRN: [id number]          DOB: May 01, 1945            Age: 78 y.o.  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    Of note, this is [name]'s eighth hospitalization at [facility] this year. Recurrent hospitalizations for HF exacerbation, GI bleeds.      Hematochezia  -Patient endorses 1 day history of bright red blood per rectum  -Hemoglobin on admission 9.5 which appears to be around baseline  -12/2 EGD: 1.5 cm friable polyp in duodenum s/p clip x2, no bleeding seen   -12/8 EGD with ulcer in duodenum and visible vessel, no active bleeding. Endo Clip placed.  -Patient currently hemodynamically stable  -Hematochezia possibly secondary to diverticular bleed versus angiodysplasia versus hemorrhoidal bleed  Plan  -Consult GI. Scopes planned for Friday. CLD and bowel prep today.  -H&H every 12 hours-- stable in 9s   -Obtain type and cross  -Hold Eliquis      Cough  RSV infection  -Patient endorses nonproductive cough and shortness of breath  -Tested positive for RSV in the ED  -Chest x-ray revealed similar cardiac silhouette enlargement with persistent perihilar and basilar opacities and vascular destructiveness favored to represent edema  Plan  -Continue with supportive therapy for now. Patient on room air   -Cough suppressants added     Acute on chronic combined systolic and diastolic heart failure  On chronic milrinone  Ischemic cardiomyopathy  CAD s/p CABG and PCI  CRT-D in situ  PAF  -Recent hospitalization 12/23-27 with HF exacerbation.   -Echo from 04/12/2023 revealed EF of 30% with grade 3 diastolic dysfunction and severe mitral and tricuspid valve regurgitation  -EKG revealed sinus rhythm with first-degree AV block and PVCs  -NT proBNP elevated at 12K. Has been >10k at baseline. Most recently 11,636 04/19/23  -Chest x-ray revealed pulmonary edema  -Suspect shortness of breath likely has a component of heart failure exacerbation  -PTA on torsemide 20 mg BID  Plan  -HF following. Continue IV Bumex 2 mg twice daily.   -Daily weight and I&O  -Resume PTA milrinone  -Resume  PTA amiodarone for PAF  -Holding Eliquis due to suspected GI bleed. Discussed with cardiology, may need to consider stopping eliquis with recurrent GI bleeds  -Interrogate device     CKD stage IV  -Baseline creatinine appears to be anywhere between 2.3-2.6  -On admission creatinine 2.36  Plan  -Monitor renal function closely while diuresing. 1/2 stable at 2.39  -Daily CMP     Multiple myeloma  -Follows at [facility] and currently on Daratumumab  -Continue PTA acyclovir for prophylaxis  -Follow-up with hematology as outpatient     DM type II  -Most recent hemoglobin A1c from 04/20/2023 was 6.3  -Will start low-dose correction factor     COPD  Pulmonary nodules  -Continue PTA albuterol and Symbicort  -Follow-up for pulmonary nodules recommended after 6 to 8 weeks     Pancreatic cyst  -CT abdomen/pelvis from 04/02/2023 revealed mild increase in size of indeterminant large lobulated cystic mass in the tail of the pancreas  -Endoscopic ultrasound recommended  -Follow-up with GI as outpatient                    FEN: no IVF, electrolytes reviewed. CLD   Prophylaxis:  Hold due to concern for GI bleed  Disposition: Admit to medicine.    Code status: Full code (Discussed on 04/27/2023)         Sharmon Revere, MD  Hospitalist, Internal Medicine    Voalte is the preferred method of communication.   Please use the Med Private First Call for all patient-related communications. Personal Voaltes and pagers are not answered at all hours.    High medical decision making due to the following:  1 or more chronic illness with side effects due to treatment and 1 acute or chronic illness that poses a threat to life or bodily function  drug therapy requiring intensive monitoring for toxicity(IV diuretic in CKD)      _______________________________________________________________________    Subjective  Raven [name] is a 78 y.o. female.  No events overnight. Patient still having bad cough with wheezing but on room air. Symptoms are up and down but currently today is a little bit better. Did not notice any blood in bowel movements today.    Medications  Scheduled Meds:acyclovir (ZOVIRAX) tablet 200 mg, 200 mg, Oral, BID  amiodarone (CORDARONE) tablet 200 mg, 200 mg, Oral, QDAY  budesonide-formoterol HFA (SYMBICORT) 160-4.5 mcg/actuation inhalation 2 puff, 2 puff, Inhalation, BID  bumetanide (BUMEX) injection 2 mg, 2 mg, Intravenous, BID(9-17)  cefdinir (OMNICEF) capsule 300 mg, 300 mg, Oral, QDAY  CHOLEcalciferoL (vitamin D3) tablet 1,000 Units, 1,000 Units, Oral, QDAY  ferrous sulfate (FEOSOL) tablet 325 mg, 325 mg, Oral, QDAY  insulin aspart (U-100) (NOVOLOG FLEXPEN U-100 INSULIN) injection PEN 0-6 Units, 0-6 Units, Subcutaneous, ACHS (22)  pantoprazole DR (PROTONIX) tablet 40 mg, 40 mg, Oral, BID  peg-electrolyte solution (NULYTELY) oral solution 2 L, 2 L, Oral, BID (See eMAR)  rosuvastatin (CRESTOR) tablet 10 mg, 10 mg, Oral, QHS  sertraline (ZOLOFT) tablet 50 mg, 50 mg, Oral, QDAY    Continuous Infusions:   milrinone (PRIMACOR) 20 mg/D5W 100 mL infusion 0.3 mcg/kg/min (04/29/23 0308)     PRN and Respiratory Meds:albuterol-ipratropium Q4H PRN, benzonatate TID PRN, dextromethorphan/guaiFENesin  (ROBITUSSIN-DM) oral syrup Q4H PRN, dextrose 50% (D50) IV PRN, eucalyptus-menthoL Q2H PRN, melatonin QHS PRN, peg-electrolyte solution PRN (On Call from Rx), polyethylene glycol 3350 QDAY PRN, sennosides-docusate sodium QDAY PRN        Objective:  Vital Signs: Last Filed                 Vital Signs: 24 Hour Range   BP: 105/52 (01/02 0805)  Temp: 36.7 ?C (98 ?F) (01/02 0805)  Pulse: 89 (01/02 0805)  Respirations: 20 PER MINUTE (01/02 0805)  SpO2: 94 % (01/02 0805)  O2%: 21 % (01/01 2159)  O2 Device: None (Room air) (01/02 0805)  Height: 157.5 cm (5' 2) (01/01 1923) BP: (105-138)/(52-119)   Temp:  [36.4 ?C (97.6 ?F)-36.8 ?C (98.3 ?F)]   Pulse:  [89-101]   Respirations:  [18 PER MINUTE-22 PER MINUTE]   SpO2:  [92 %-99 %]   O2%:  [21 %]   O2 Device: None (Room air)     Vitals:    04/27/23 1352 04/28/23 1923 04/29/23 0316   Weight: 76.2 kg (168 lb) 76.5 kg (168 lb 9.6 oz) 75.8 kg (167 lb)         Intake/Output Summary:  (Last 24 hours)    Intake/Output Summary (Last 24 hours) at 04/29/2023 0910  Last data filed at 04/29/2023 0600  Gross per 24 hour   Intake 700 ml   Output 300 ml   Net 400 ml           Physical Exam  Physical Exam  Vitals reviewed.   Constitutional:       General: She is not in acute distress.     Appearance: She is not toxic-appearing.   Cardiovascular:      Rate and Rhythm: Normal rate.   Pulmonary:      Breath sounds: Wheezing and rhonchi present.   Musculoskeletal:      Right lower leg: Edema present.      Left lower leg: Edema present.   Neurological:      Mental Status: She is alert and oriented to person, place, and time.   Psychiatric:         Mood and Affect: Mood normal.           Lab Review  Pertinent labs reviewed and noted in A/P.     Point of Care Testing  (Last 24 hours)  Glucose: (!) 134 (04/29/23 0323)  POC Glucose (Download): (!) 127 (04/29/23 0805)    Radiology and other Diagnostics Review:    Pertinent radiology reviewed and noted in A/P.

## 2023-04-30 ENCOUNTER — Encounter: Admit: 2023-04-30 | Discharge: 2023-04-30 | Payer: MEDICARE

## 2023-04-30 ENCOUNTER — Inpatient Hospital Stay: Admit: 2023-04-30 | Discharge: 2023-04-30 | Payer: MEDICARE

## 2023-04-30 LAB — POC GLUCOSE
~~LOC~~ BKR POC GLUCOSE: 146 mg/dL — ABNORMAL HIGH (ref 70–100)
~~LOC~~ BKR POC GLUCOSE: 103 mg/dL — ABNORMAL HIGH (ref 70–100)
~~LOC~~ BKR POC GLUCOSE: 82 mg/dL (ref 70–100)
~~LOC~~ BKR POC GLUCOSE: 90 mg/dL (ref 70–100)

## 2023-04-30 LAB — COLONOSCOPY

## 2023-04-30 MED ORDER — LACTATED RINGERS IV SOLP
INTRAVENOUS | 0 refills | Status: DC
Start: 2023-04-30 — End: 2023-04-30
  Administered 2023-04-30: 20:00:00 1000.0000 mL via INTRAVENOUS

## 2023-04-30 MED ORDER — ACETAMINOPHEN 325 MG PO TAB
650 mg | ORAL | 0 refills | Status: DC | PRN
Start: 2023-04-30 — End: 2023-05-06
  Administered 2023-05-01 – 2023-05-04 (×4): 650 mg via ORAL

## 2023-04-30 MED ORDER — ALBUTEROL SULFATE 2.5 MG/0.5 ML IN NEBU
2.5 mg | Freq: Once | RESPIRATORY_TRACT | 0 refills | Status: CP
Start: 2023-04-30 — End: ?
  Administered 2023-04-30: 20:00:00 2.5 mg via RESPIRATORY_TRACT

## 2023-04-30 MED ORDER — LIDOCAINE (PF) 20 MG/ML (2 %) IJ SOLN
INTRAVENOUS | 0 refills | Status: DC
Start: 2023-04-30 — End: 2023-04-30

## 2023-04-30 MED ORDER — PROPOFOL INJ 10 MG/ML IV VIAL
INTRAVENOUS | 0 refills | Status: DC
Start: 2023-04-30 — End: 2023-04-30

## 2023-04-30 MED ORDER — AMITRIPTYLINE(#)/GABAPENTIN/BACLOFEN 2/6/2% TOPICAL CRM
Freq: Three times a day (TID) | TOPICAL | 0 refills | Status: DC | PRN
Start: 2023-04-30 — End: 2023-05-06
  Administered 2023-05-01: 04:00:00 via TOPICAL

## 2023-04-30 MED ORDER — POTASSIUM CHLORIDE 20 MEQ PO TBTQ
60 meq | Freq: Once | ORAL | 0 refills | Status: CP
Start: 2023-04-30 — End: ?
  Administered 2023-04-30: 15:00:00 60 meq via ORAL

## 2023-04-30 MED ORDER — PROPOFOL 10 MG/ML IV EMUL 50 ML (INFUSION)(AM)(OR)
INTRAVENOUS | 0 refills | Status: DC
Start: 2023-04-30 — End: 2023-04-30
  Administered 2023-04-30: 20:00:00 120 ug/kg/min via INTRAVENOUS

## 2023-04-30 MED ORDER — PHENYLEPHRINE HCL IN 0.9% NACL 1 MG/10 ML (100 MCG/ML) IV SYRG
INTRAVENOUS | 0 refills | Status: DC
Start: 2023-04-30 — End: 2023-04-30

## 2023-04-30 NOTE — Progress Notes
PHYSICAL THERAPY  NOTE      Name: Raven [name]   MRN: [id number]     DOB: 1945/09/21      Age: 78 y.o.  Admission Date: 04/27/2023     LOS: 3 days     Date of Service: 04/30/2023      Attempted to see patient for PT, patient politely declines as she has been getting up to the bathroom multiple times as she is completing bowel prep. Physical Therapy will follow up for intervention.    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 Presents to emergency department with complaints of hematochezia and cough admitted for further evaluation. Of note, this is Ms. Needles's 8th admission over the last 9 months.    Home Living Situation  Lives With: Family  Type of Home: House  Entry Stairs: 2  In-Home Stairs: 1 flight;Stair glide  Bathroom Setup: Tub/shower unit  Patient Owned Equipment: Bathing: Soil scientist: Public house manager)  Comments: Pt reports her son and DIL provide assistance when navigating stairs, pt reports they can provide consistent assist as needed. Pt uses RW within home and electric scooter for community distances.    Prior Level of Function  Level Of Independence: Independent with ADL and household mobility with device;Assist needed for IADL  Required Assist For: All home functioning ADL;Meal preparation;Medication/finance management;Stairs;Transportation/driving  Comments: Pt reports independence w/ ADLs (dressing, showering, toileting). Pt reports his son and DIL assist w/ IADLs (cooking, cleaning, laundry).  History of Falls in Past 3 Months: No        Therapist  Garner Gavel, PT, DPT  Date 04/30/2023

## 2023-04-30 NOTE — Anesthesia Post-Procedure Evaluation
Post-Anesthesia Evaluation    Name: Raven [name]      MRN: [id number]     DOB: [birth year]     Age: 78 y.o.     Sex: female   __________________________________________________________________________     Procedure Information       Anesthesia Start Date/Time: 04/30/23 1422    Procedure: COLONOSCOPY WITH BIOPSY - FLEXIBLE    Location: ENDO 4 / ENDO/GI    Surgeons: Onnie Boer, MD            Post-Anesthesia Vitals  BP: 107/65 (01/03 1540)  Temp: 36.2 ?C (97.2 ?F) (01/03 1505)  Pulse: 90 (01/03 1540)  Respirations: 22 PER MINUTE (01/03 1540)  SpO2: 95 % (01/03 1540)  O2 Device: None (Room air) (01/03 1540)   Vitals Value Taken Time   BP 107/65 04/30/23 1540   Temp 36.2 ?C (97.2 ?F) 04/30/23 1505   Pulse 90 04/30/23 1540   Respirations 22 PER MINUTE 04/30/23 1540   SpO2 95 % 04/30/23 1540   O2 Device None (Room air) 04/30/23 1540   ABP     ART BP           Post Anesthesia Evaluation Note    Evaluation location: Pre/Post  Patient participation: recovered; patient participated in evaluation  Level of consciousness: alert    Pain score: 0  Pain management: adequate    Hydration: normovolemia  Temperature: 36.0?C - 38.4?C  Airway patency: adequate    Perioperative Events       Post-op nausea and vomiting: no PONV    Postoperative Status  Cardiovascular status: hemodynamically stable  Respiratory status: spontaneous ventilation  Follow-up needed: none  Additional comments: Sipping a drink. No concerns. Ok for sign out.         Perioperative Events  There were no known complications for this encounter.

## 2023-04-30 NOTE — Progress Notes
HC5 END OF SHIFT/PLAN OF CARE NURSING NOTE   Nursing Shift: Night Shift 1900-0700    Acute events, nursing interventions, & communication with providers: patient not progressing on bowel prep very well. Pt states it is too much liquid to drink. Team notified. Will continue to encourage patient to drink bowel prep.       Patient Goal(s)  Patient will Report breathing has improved by the end of next shift.        Patient will  Report breathing has improved by discharge.   Admission Weight: Weight: 76.2 kg (168 lb)    Last 3 Weights:   Vitals:    04/28/23 1923 04/29/23 0316 04/30/23 0600   Weight: 76.5 kg (168 lb 9.6 oz) 75.8 kg (167 lb) 76.3 kg (168 lb 3.2 oz)     Weight Change: Weight trend stable    Intake/Output Summary (Last 24 hours) at 04/30/2023 0650  Last data filed at 04/30/2023 0500  Gross per 24 hour   Intake 1506 ml   Output 700 ml   Net 806 ml     Last Bowel Movement Date: 04/29/23    Fluid Restriction? No   Quality/Safety    Total Fall Risk Score: 12   Risk for Injury related to falls: Coagulopathies/risk for bleed  Fall Risk Category:   History of More Than One Fall Within 6 Months Before Admission: No  Elimination, Bowel and Urine: N/A  Interventions: Use of bladder management device (e.g., female/female external urinary containment device)   Medications: On 2 or more high fall risk drugs  Interventions: Use of gait belt   Patient Care Equipment: One present  Interventions: N/A - Does not score as risk in this category  Mobility: 2 - Assistance required, 2 - Unsteady gait  Interventions: Assist x1 and Utilize walker, cane, or additional walking aid for ambulation  Cognition: 0 - No cognition issues  Interventions: Bed/Chair Alarm     Other safety precautions in place: N/A    Restraints:  No      Patient Education  This RN provided education to Patient today. The following education topics were reviewed:  Quality/Safety Education:   Fall risk  Medication Education:   Cardiac medication(s)  Education provided on the following medication(s): milrinone  Cardiac - Specific Education:   Heart failure management  General Education:   Bowel/Urinary elimination    The following teaching method(s) were used: Verbal  Response to learning: [employer]  Needs reinforcement on: NA

## 2023-04-30 NOTE — Case Management (ED)
Case Management 30 Day Readmission Assessment      NAME: Janaeya Lyson                    MRN: [id number]              DOB: [birth year]          AGE: 78 y.o.  ADMISSION DATE: 04/27/2023             DAYS ADMITTED: LOS: 3 days    Today's Date: 04/30/2023    Source of Information: Patient and EMR    Expected Discharge Date:  05/01/2023 3:00 PM    Plan:  Pt gong for scope today.     Pt discharged with [facility] for IV Milrinone on last admission.     NCM will continue to follow for discharge planning.     Previous Inpatient Discharge Date:    04/23/23    Previous Case Management admission assessment dated 04/20/23 is copied at the end of this note for reference and was reviewed with patient/family.  Changes to information from previous admission assessment are: none.     Acute Hospital Stay:  Acute Hospital Stay: Yes  Was patient's stay within the last 30 days?: Yes  Name of Hospital: [facility]  When did patient receive care?: 12/23-12/27  Readmission Code Group: 7. Advancement of Disease / Chronic Illness Management  7. Advancement of Disease / Chronic Illness Management: 7a10. GI    Prior Living Situation/Admitted from?   Type of Residence: Home, with Home Health or other assistance    Post-Acute Services/DME Last Discharge:  Were post-acute services/DME arranged at previous disharge?: Yes  Which services/DME were arranged at previous discharge?: Home health, Home infusion-enteral-TPN ([facility] and [facility])  Does patient/family feel services/DME were adequate?: Yes    Lack of services:  At or after previous discharge, what did the patient/family feel they lacked?: Patient/family felt discharge services were adequate    Previous Discharge Resources Provided:  Were resources provided to patient at previous discharge in person or within AVS?: No    Social Determinants of Health (SDOH):  Within the past 12 months we worried whether our food would run out before we got money to buy more.: Never True        In the past 12 months, have you ever gone without  health care because you didn't have a way to get there?: No     In the last 12 months, has your utility company shut off your service for not paying your bills?: No  Are you worried that in the next 2 months, you may not have stable housing?: No    Transportation:  Does the Patient Need Case Management to Arrange Discharge Transport? (ex: facility, ambulance, wheelchair/stretcher, Medicaid, cab, other): No  Will the Patient Use Family Transport?: Yes  Transportation Name, Phone and Availability #1: Bertis Ruddy (Ph: [phone])    Valentina Gu, RN    Case Management Admission Assessment     NAME:Will Tonji Garrett                          MRN: [id number]             DOB:[birth year]          AGE: 78 y.o.  ADMISSION DATE: 04/19/2023             DAYS ADMITTED: LOS: 0 days  Today?s Date: 04/20/2023     Source of Information: EMR, met with pt at bedside and son via phone        Plan  Plan: Case Management Assessment, Discharge Planning for Home with Post-Acute Care Needs, Assist PRN with SW/NCM Services, Psychosocial Assessment  This CM met with pt for assessment on this date.  Provided contact information and explanation of SW/NCM roles.  Reviewed [facility], Preparing for Discharge, and [facility] hand-outs.  Provided opportunity for questions and discussion. Pt/family encouraged to contact Case Management team with questions and concerns during hospitalization and until patient is able to transition back to the patient's primary care physician.      Return home with [facility]. Son to transport. No new concerns at this time. Has necessary DME and family support     Patient Address/Phone  [address]  [place] [address]  [phone] (home)      Emergency Contact  Extended Emergency Contact Information  Primary Emergency Contact: Shrestha,CLIFTON  Home Phone: [phone]  Mobile Phone: [phone]  Relation: Son  Secondary Emergency Contact: [facility]  Home Phone: [phone]  Mobile Phone: [phone]  Relation: Relative  Interpreter needed? No     Healthcare Directive  Healthcare Directive: Yes, patient has a healthcare directive  Type of Healthcare Directive: Durable power of attorney for healthcare  Location of Healthcare Directive: Patient does not have it with him/her  Would patient like to fill out a (a new) Healthcare Directive?: No, patient declined        Transportation  Does the Patient Need Case Management to Arrange Discharge Transport? (ex: facility, ambulance, wheelchair/stretcher, Medicaid, cab, other): No  Will the Patient Use Family Transport?: Yes  Transportation Name, Phone and Availability #1: Bertis Ruddy [phone]     Expected Discharge Date  04/23/2023      Living Situation Prior to Admission  Living Arrangements  Type of Residence: Home, with Home Health or other assistance  Living Arrangements: Family members  Financial risk analyst / Tub: Psychologist, counselling, Tub/Shower Unit  How many levels in the residence?: 2  Can patient live on one level if needed?: Yes  Does residence have entry and/or inside stairs?: Yes (2-STE)  Assistance needed prior to admit or anticipated on discharge: Yes  Who provides assistance or could if needed?: son, dil and dtr  Are they in good health?: Yes  Can support system provide 24/7 care if needed?: Yes  Level of Function   Prior level of function: Needs assist with ADLs  Which ADLs require assistance?: transport, medication  Who assists with ADLs?: family  Cognitive Abilities   Cognitive Abilities: Alert and Oriented, Understands nature of health condition, Engages in problem solving and planning     Financial Resources  Coverage  Primary Insurance: Medicare Replacement [facility] Medicare)  Medication Coverage    Medication Coverage:  ([employer])  Are current medications affordable?: Yes  Do You Manage Your Own Medications?: Yes  Source of Income   Source Of Income: SSI  Financial Assistance Needed?  na     Psychosocial Needs  Mental Health  Mental Health History: No  Substance Use History  Substance Use History Screen: No  Other  na     Current/Previous Services  PCP  Barbaraann Rondo, [phone], [phone]  Pharmacy     [facility] [address], [place] - [address]  [address]  [place] [address]  Phone: [phone] Fax: [phone]     Durable Medical  Publishing copy at home: CPAP/BiPAP, Paediatric nurse, Environmental consultant, Biomedical scientist (power)  Home Health  Receiving home health: Yes  Agency name: [facility]  Would patient use this agency again?: Yes  Hemodialysis or Peritoneal Dialysis  Undergoing hemodialysis or peritoneal dialysis: No  Tube/Enteral Feeds  Receive tube/enteral feeds: No  Infusion  Receive infusions: No  Private Duty  Private duty help used: No  Home and Community Based Services  Home and community based services: No  Ryan White  Ryan White: No  Hospice  Hospice: No  Outpatient Therapy  PT: No  OT: No  SLP: No  Skilled Nursing Facility/Nursing Home  SNF: No  NH: No  Inpatient Rehab  IPR: No  Long-Term Acute Care Hospital  LTACH: No  Acute Hospital Stay  Acute Hospital Stay: Yes  Was patient's stay within the last 30 days?: Yes  [facility]  Readmission Code Group: 7. Advancement of Disease / Chronic Illness Management  7. Advancement of Disease / Chronic Illness Management: 7a1. Cardiac: CHF / AMI    Valentina Gu, BSN, RN  Integrated Nurse Case Manager  Available on [employer]

## 2023-04-30 NOTE — Progress Notes
 Colon/Lower EUS/Retrograde Enteroscopy     Post Lower Endoscopy Instructions    -If you feel feverish, have a temperature of 101 degrees or higher, persistent nausea and vomiting, abdominal pain or dark stools; please notify your nurse or GI physician.    -You may have abdominal cramping following the procedure this can be relieved by belching or passing air.    -If you have redness or swelling at the IV site, place a warm, wet washcloth over the affected areas for 15 minutes, 3-4 times a day until the redness subsides.  If symptoms continue for 2-3 days, contact your regular physician.    - If you have bleeding from your bowels over 2 tablespoons and increasing, please notify your physician.  A small amount of bleeding is normal if a biopsy or polyps were taken.    - If biopsies were obtained your provider will call you with the results, this can take up to 7 days after the procedure.    - You may resume all your routine medications, if medications need to be held your physician and/or nurse will notify you post procedure.    SPECIFIC INSTRUCTIONS  INPATIENTS:  Ask for help when you get up in your room, as you may still be drowsy from your sedation.    OUTPATIENTS:  Because of sedation and lack of coordination, FOR THE NEXT 24 HOURS, DO NOT:  Operate any motorized vehicle - this includes driving.  Sign any legal documents or conduct important business matters.  Use any dangerous machinery (chain saw, lawnmower, etc.).  Drink any alcoholic beverages.  Should you have any questions or concerns after your procedure please call [phone] M-F 8am-5:00 pm. After 5:00 pm, holidays or weekends call [phone] and ask for the GI Doctor on call.

## 2023-04-30 NOTE — Progress Notes
Heart Failure Progress Note    NAME:Raven [name]             MRN: [id number]                 DOB:11/04/1945          AGE: 78 y.o.  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 She follows with Dr. Herma Carson. Raven [name] and Raven Abraham, APRN.     She presented to the ED on 04/27/23 reporting rectal bleeding, had recently had dark jellylike stool, abdominal discomfort, fatigue, nausea, had recently been diagnosed with RSV therefore was also having coughing symptoms.  Labs revealed stable H&H of 9.5/30.5, NT proBNP 12,625, high-sensitivity troponin levels 33-39-40.  Chest x-ray suggestive of pulmonary edema. Unfortunately Raven [name] has had 4 hospitalizations over the last month as outlined below; this is her 8th admission over the last 9 months.     Admitted from 11/27-12/4/24 with melena and anemia, received PRBCs and underwent EGD on 12/2 which revealed 1.5 cm pedunculated viable polyp in duodenum which was clipped, Aspirin and Plavix were discontinued and she remained on Eliquis for anticoagulation.  Admitted from 12/6-12/13/24 with hematemesis, melena, symptomatic anemia.  Although her Plavix and aspirin had been discontinued during prior admission, pt reported she continued taking both aspirin and Plavix along with her Eliquis.  She required x4 units of PRBCs. Repeat EGD on 12/8 showed ulcer in duodenum and visible vessel with no active bleeding which was injected with epinephrine and Endo Clip placed.  Admitted from 12/15-12/19/24 for decompensated heart failure, required IV diuresis. It was noted that her milrinone infusion had been off for unknown amount of time.  For ongoing anemia she required x1 unit of PRBCs.  Admitted from 12/23-12/27/24 after she presented to HF clinic reporting symptoms of shortness of breath, concerns of hypervolemia and ~10 lbs weight gain.  She received IV diuresis, had developed a cough which was later identified as RSV when she was seen by her local ED.    Recommendations 04/30/23:  Chronic cardiogenic shock: continue home milrinone infusion at 0.3 mcg/kg/min  Diuretics: continue Bumex 2 mg IV  BID - may need to consider increasing bumex or adding diuril tomorrow if no response  HFrEF GDMT: limited with CKD and hx cardiogenic shock. Toprol XL stopped during hospitalization 12/23-12/27. Will not re-add at this time. Previously on [name] which was recently dc'd, defer resumption at this time with GFR 20  Paroxsymal AFib: Continue PTA Amiodarone 200 mg daily, PTA Eliquis remains on hold with concerns for GIB. Messaged Watchman navigator 04/29/23 to schedule appointment to discuss Watchman outpatient.   CAD: continue PTA Rosuvastatin 10 mg daily  Hx GIB: H/H remains stable; monitor closely for signs of bleeding. GI following. EGD/colonoscopy 1/3.  We will continue to follow.     Ongoing:  BMP once a day. Mg level daily. Maintain K+ over 4.0 and Mg+ over 2.0   2000mg  sodium dietary restriction.  Fluid Restriction:1.5  Strict I/O. Goal output: net neg 2L/24 hour  Daily standing scale weight. Goal Dry Weight: unknown.  Previously thought to be 165 pounds   Please place a specimen in lab NT-proBNP order on day of discharge in effort to establish baseline value correlating with suspected euvolemia.    Primary team responsible for placing Post-Discharge [facility] Appointment Request order set for Cardiology: Heart Failure appointment request within 24-48 hours of discharge. (Please do not place order any earlier in effort to reduce possible need for cancellation and rescheduling of visit).    Discussed with Dr. Jeannette Corpus.    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   She presents with signs of hypervolemia with Left  ventricular failure with signs of low flow state.    BNP Labs:   Lab Results   Component Value Date    NTPROBNP 12,625 (H) 04/27/2023    NTPROBNP 11,636 (H) 04/19/2023    NTPROBNP 11,324 (H) 04/12/2023      High Sensitivity Troponin:  Lab Results   Component Value Date    HSTROP0HR 33 (H) 04/27/2023    HSTROP2HR 39 (H) 04/27/2023    HSTROP4HR 40 (H) 04/27/2023    HSTROPDELT4 1 04/27/2023 HSTROPDELTA -2 04/19/2023    HIGHSTROPI 28 (H) 04/12/2023     Admission Weight: 76.2 kg (168 lb)        Most recent weights (inpatient):   Vitals:    04/28/23 1923 04/29/23 0316 04/30/23 0600   Weight: 76.5 kg (168 lb 9.6 oz) 75.8 kg (167 lb) 76.3 kg (168 lb 3.2 oz)        Strict I/O:   Last 24 hours: +890mL (accuracy?)  Entire hospitalization: +831mL  Urine output/24 hours:    PLAN:  Diuretic Therapy    Prior to admission dose Torsemide 20 mg BID + Kdur 20 mEq BID      Given on admission 12/31: IV Lasix 60 mg x 1      Daily Dosing 1/1-1/2: Bumex 2 mg IV BID  1/3: bumex 2mg  IV BID     Guideline Directed Medical Therapy PTA Inpatient Changes   ACEI/ARB N/A-CKD IV    ARNI N/A-CKD IV  BB N/A-cardiogenic shock on home inotrope; previously tolerated Toprol XL which was discharged during most recent hospitalization.  Will not resume at this time   SGLT-2 Inhibitor N/A- recently on [name] 25 mg/day which was dc'd during 12/15-12/19 admission Defer resumption with CrCl <20 mL/min   Mineralocorticoid Receptor Antagonist N/A-CKD IV    Hydralazine/Nitrate N/A, BP well controlled    Ivabradine N/A, cardiogenic shock    Heart Rhythm Management Therapy Yes (ICD) 04/12/23 interrogation stable, no recent events   Anticoagulation for Afib/flutter apixaban (Eliquis) 5 mg BID Held on admission with concern for GIB   Cardiac Rehab Evaluation for LVEF < or equal to 40% Yes; Date ordered: 04/12/23    7-Day post hospital follow up scheduled within 24-48 hours of discharge No Primary team to schedule a follow up using [facility]-Discharge [facility] Appointment Request Order Set for Cardiology: Heart Failure appt request within 24-48 hours of discharge      Inotrope Therapy Dose & Adjustments  (document symptom and hemodynamic response if pertinent)   Milrinone 0.3 mcg/kg/min Started during hospitalization May 2024     Testing/Procedures   04/12/23 Echo:  Eccentric hypertrophy of the left ventricle with severe dilatation of the left ventricular chamber  Left ventricular systolic function is moderately to severely impaired with an estimate ejection fraction of 30% and global hypokinesis  Right ventricle is moderately dilated with normal function  The left atrium is severely dilated  Pacemaker lead seen in the right atrium and right ventricle  Severe mitral valve insufficiency  Severe tricuspid valve insufficiency  No pericardial effusion  Severe pulmonary hypertension with estimated peak systolic pulmonary artery pressure of 73 mmHg    RHC 09/18/22 (weight 165 pounds)  RA 13  RV 70/12  PA 72/27 with mean 47  PCWP 17  TPG 30  PVR 12  CO/CI 2.5/1.4 by thermo  CO/CI 1.9/1.1 by Fick      Heart Failure Remote Monitoring and/or Heart Failure Research Candidacy   Patient is not an appropriate candidate for any remote monitoring or heart failure research studies due to  end stage HF .    CAD s/p CABG x3 (2010)  - 12/31 admit troponin 33>39>40, EKG without acute ischemic changes   > cont statin therapy; aspirin and Plavix recently dc'd with GIB    S/p ICD  - device evaluation 04/12/23 AP <1%/VP <1%  - ICD interrogation 04/29/23: AP <1%/VP <1% with presenting rhythm AS-VS 90s with elevated HF diagnostics    HLD  Lab Results   Component Value Date    CHOL 107 09/16/2022    TRIG 72 09/16/2022    HDL 50 09/16/2022    LDL 45 09/16/2022    VLDL 14 09/16/2022    NONHDLCHOL 57 09/16/2022   > cont PTA Rosuvastatin 10 mg/day    Paroxysmal AFib  - PTA anticoagulated with Eliquis 5 mg BID held on admission  - cont PTA Amiodarone 200 mg/day  - recommend evaluation for Watchman in outpatient setting     CKD stage IV  - 12/31 admit creat 2.36  - baseline creat ranges 2.4-2.6  Lab Results   Component Value Date    CR 2.24 (H) 04/30/2023    CR 2.39 (H) 04/29/2023    CR 2.38 (H) 04/28/2023   > avoid nephrotoxins, daily labs to follow    Chronic Anemia  Frequent GIB  - 12/31 admit H/H 9.5/30.5  Lab Results   Component Value Date    IRON 25 (L) 04/28/2023  PSAT 17 (L) 04/28/2023    TIBC 148 (L) 04/28/2023    FERRITIN 950 (H) 04/28/2023    > s/p IV Venofer 300 mg x1 on 03/31/23  - 04/30/23: EGD/colonoscopy     Hx DM 2  - 04/20/23 HgbA1c 6.3  - no PTA meds; previously on [name] 25 mg daily, this was discontinued during recent admission   > managed per primary team     RSV  - supportive care per primary team    _____________________________________________________________________________  Subjective:   She currently complains of fatigue, shortness of breath, dyspnea on exertion, orthopnea, abdominal fullness, and peripheral edema. She has been prepping for her colonoscopy.      She currently denies paroxysmal nocturnal dyspnea, lightheadedness, positional lightheadedness, palpitations, and chest pain.        Objective:                       Vital Signs:  Last Filed                Vital Signs: 24 Hour Range   BP: 127/59 (01/03 0216)  Temp: 36.7 ?C (98 ?F) (01/03 0216)  Pulse: 94 (01/03 0216)  Respirations: 20 PER MINUTE (01/03 0216)  SpO2: 94 % (01/03 0216)  O2 Device: None (Room air) (01/03 0216)  BP: (105-127)/(52-64)   Temp:  [36.3 ?C (97.4 ?F)-36.8 ?C (98.2 ?F)]   Pulse:  [88-97]   Respirations:  [16 PER MINUTE-20 PER MINUTE]   SpO2:  [94 %-98 %]   O2 Device: None (Room air)           Wt Readings from Last 10 Encounters:   04/30/23 76.3 kg (168 lb 3.2 oz)   04/23/23 76 kg (167 lb 9.6 oz)   04/15/23 77.6 kg (171 lb)   04/06/23 75.9 kg (167 lb 5.3 oz)   03/26/23 74.2 kg (163 lb 9.3 oz)   03/19/23 77 kg (169 lb 12.8 oz)   02/16/23 75.3 kg (166 lb)   02/15/23 75.3 kg (166 lb)   01/04/23 75.2 kg (165 lb 12.8 oz)   12/14/22 74.6 kg (164 lb 6.4 oz)     Physical Exam:    General Appearance: no distress  Neck Veins: JVP 10cm, HJR positive (unchanged; she does have severe TR)  Respiratory: Non-labored respiratory pattern on room air, breath sounds diminished in posterior bases  Cardiac Auscultation: Regular rhythm, S1, S2, no S3 or S4, grade 2/6 murmur  Lower Extremity Edema: +1 mm BLE (unchanged)  Abdominal Exam: soft, mildly distended, non-tender, bowel sounds normal  Orientation: clear historian, good insight    Laboratory Review:   CBC w/Diff    Lab Results   Component Value Date/Time    WBC 7.60 04/30/2023 05:11 AM    RBC 3.07 (L) 04/30/2023 05:11 AM    HGB 9.0 (L) 04/30/2023 05:11 AM    HCT 27.2 (L) 04/30/2023 05:11 AM    MCV 88.5 04/30/2023 05:11 AM    MCH 29.4 04/30/2023 05:11 AM    MCHC 33.3 04/30/2023 05:11 AM    RDW 24.2 (H) 04/30/2023 05:11 AM    PLTCT 215 04/30/2023 05:11 AM    MPV 8.2 04/30/2023 05:11 AM    Lab Results   Component Value Date/Time    NEUT 57 04/30/2023 05:11 AM    ANC 4.30 04/30/2023 05:11 AM    LYMA 26 04/30/2023 05:11 AM    ALC 2.00 04/30/2023 05:11 AM    MONA 11 04/30/2023 05:11 AM  AMC 0.80 04/30/2023 05:11 AM    EOSA 4 04/30/2023 05:11 AM    AEC 0.30 04/30/2023 05:11 AM    BASA 2 04/30/2023 05:11 AM    ABC 0.10 04/30/2023 05:11 AM         Chemistry    Lab Results   Component Value Date/Time    NA 141 04/30/2023 05:11 AM    K 3.7 04/30/2023 05:11 AM    CL 106 04/30/2023 05:11 AM    CO2 23 04/30/2023 05:11 AM    GAP 12 04/30/2023 05:11 AM    BUN 40 (H) 04/30/2023 05:11 AM    CR 2.24 (H) 04/30/2023 05:11 AM    GLU 100 04/30/2023 05:11 AM    MG 2.0 04/29/2023 03:23 AM    Lab Results   Component Value Date/Time    CA 8.3 (L) 04/30/2023 05:11 AM    PO4 4.0 04/27/2023 06:29 PM    ALBUMIN 3.1 (L) 04/30/2023 05:11 AM    TOTPROT 4.9 (L) 04/30/2023 05:11 AM    ALKPHOS 95 04/30/2023 05:11 AM    AST 16 04/30/2023 05:11 AM    ALT 14 04/30/2023 05:11 AM    TOTBILI 0.9 04/30/2023 05:11 AM    GFR 22 (L) 04/30/2023 05:11 AM            Renal Function    Lab Results   Component Value Date/Time    NA 141 04/30/2023 05:11 AM    K 3.7 04/30/2023 05:11 AM    CL 106 04/30/2023 05:11 AM    CO2 23 04/30/2023 05:11 AM    GAP 12 04/30/2023 05:11 AM    BUN 40 (H) 04/30/2023 05:11 AM    BUN 43 (H) 04/29/2023 03:23 AM    BUN 42 (H) 04/28/2023 05:44 AM    Lab Results   Component Value Date/Time    CR 2.24 (H) 04/30/2023 05:11 AM    CR 2.39 (H) 04/29/2023 03:23 AM    CR 2.38 (H) 04/28/2023 05:44 AM    GLU 100 04/30/2023 05:11 AM    CA 8.3 (L) 04/30/2023 05:11 AM    PO4 4.0 04/27/2023 06:29 PM    ALBUMIN 3.1 (L) 04/30/2023 05:11 AM        Lipid Profile INR   Lab Results   Component Value Date    CHOL 107 09/16/2022    TRIG 72 09/16/2022    HDL 50 09/16/2022    LDL 45 09/16/2022    VLDL 14 09/16/2022    NONHDLCHOL 57 09/16/2022         Lab Results   Component Value Date    INR 3.1 (H) 04/27/2023          04/27/23 Chest X-Ray:   Similar cardiac silhouette enlargement with persistent perihilar and basal opacities and vascular indistinctness, favored to represent edema.      Tele:  SR, PVCs, HR mostly 80s     04/27/23 ECG:   SR w/ 1st degree A/V block, occasional PVCs, HR 85 bpm, QTc 504

## 2023-04-30 NOTE — Progress Notes
RT Adult Assessment Note    NAME:Raven [name]             MRN: [id number]             DOB:24-Feb-1946          AGE: 78 y.o.  ADMISSION DATE: 04/27/2023             DAYS ADMITTED: LOS: 3 days    Additional Comments:  Impressions of the patient: pt on RA in NAD  Intervention(s)/outcome(s): eval/treat  Patient education that was completed: none  Recommendations to the care team: none    Vital Signs:  Pulse:    RR:  16  SpO2:    O2 Device: None (Room air)  Liter Flow:    O2%:    96  Breath Sounds:   Breath Sounds WDL: Exceptions to WDL  Right Base Breath Sounds: Decreased  Left Base Breath Sounds: Decreased  All Breath Sounds: Coarse crackles  Respiratory Effort:   Respiratory Effort/Pattern: SOA (Short of air) on exertion  Comments:

## 2023-04-30 NOTE — Progress Notes
[facility] END OF SHIFT/PLAN OF CARE NURSING NOTE   Nursing Shift: Day Shift 0700-1900    Acute events, nursing interventions, & communication with providers: No acute events      Patient Goal(s)  Patient will Improve their nutritional intake  by the end of next shift.        Patient will  Report breathing has improved by discharge.   Admission Weight: Weight: 76.2 kg (168 lb)    Last 3 Weights:   Vitals:    04/29/23 0316 04/30/23 0600 04/30/23 1311   Weight: 75.8 kg (167 lb) 76.3 kg (168 lb 3.2 oz) 76.2 kg (168 lb)     Weight Change: Weight trend increasing    Intake/Output Summary (Last 24 hours) at 04/30/2023 1737  Last data filed at 04/30/2023 1607  Gross per 24 hour   Intake 1342 ml   Output 200 ml   Net 1142 ml     Last Bowel Movement Date: 04/29/23    Fluid Restriction? No   Quality/Safety    Total Fall Risk Score: 12   Risk for Injury related to falls: Coagulopathies/risk for bleed  Fall Risk Category:   History of More Than One Fall Within 6 Months Before Admission: No  Elimination, Bowel and Urine: N/A  Interventions: N/A - Does not score for risk in this category  Medications: On 2 or more high fall risk drugs  Interventions: Use of gait belt , Stay within arm's reach during toileting/showering (i.e., dizziness, orthostasis) , and Bed/chair alarm (i.e., change in mental status)   Patient Care Equipment: One present  Interventions: Needs assistance with patient care equipment when ambulating  Mobility: 2 - Assistance required, 2 - Unsteady gait  Interventions: Assist x1, Gait belt in use when ambulating, and Utilize walker, cane, or additional walking aid for ambulation  Cognition: 0 - No cognition issues  Interventions: N/A - Does not score as risk in this category    Other safety precautions in place: N/A    Restraints:  No      Patient Education  This RN provided education to Patient today. The following education topics were reviewed:  Quality/Safety Education:   Fall risk  Medication Education:   Medication management (Indication, adverse effects, monitoring, etc)  Education provided on the following medication(s): bumex  Cardiac - Specific Education:   Heart failure management  General Education:   Bowel/Urinary elimination, Diet/nutrition, and Discharge planning    The following teaching method(s) were used: Verbal  Response to learning: [employer]

## 2023-05-01 LAB — DEVICE EVALUATION - ICD (IMPLANTABLE CARDIOVERTER DEFIBRILLATOR)
EP A PACING%: 1 %
EP DEVICE BATTERY VOLTAGE: 3
EP GENERATOR IMPLANT DATE: 202
EP GENERATOR SERIAL#: 810
EP RV PACING%: 1 %

## 2023-05-01 LAB — POC GLUCOSE
~~LOC~~ BKR POC GLUCOSE: 121 mg/dL — ABNORMAL HIGH (ref 70–100)
~~LOC~~ BKR POC GLUCOSE: 133 mg/dL — ABNORMAL HIGH (ref 70–100)
~~LOC~~ BKR POC GLUCOSE: 136 mg/dL — ABNORMAL HIGH (ref 70–100)
~~LOC~~ BKR POC GLUCOSE: 170 mg/dL — ABNORMAL HIGH (ref 70–100)

## 2023-05-01 MED ORDER — BUMETANIDE 0.25 MG/ML IJ SOLN
3 mg | Freq: Two times a day (BID) | INTRAVENOUS | 0 refills | Status: DC
Start: 2023-05-01 — End: 2023-05-03
  Administered 2023-05-01 – 2023-05-03 (×5): 3 mg via INTRAVENOUS

## 2023-05-01 MED ORDER — IPRATROPIUM-ALBUTEROL 0.5 MG-3 MG(2.5 MG BASE)/3 ML IN NEBU
3 mL | Freq: Two times a day (BID) | RESPIRATORY_TRACT | 0 refills | Status: DC | PRN
Start: 2023-05-01 — End: 2023-05-06
  Administered 2023-05-02 – 2023-05-06 (×10): 3 mL via RESPIRATORY_TRACT

## 2023-05-01 MED ORDER — POTASSIUM CHLORIDE 20 MEQ PO TBTQ
40 meq | Freq: Once | ORAL | 0 refills | Status: CP
Start: 2023-05-01 — End: ?
  Administered 2023-05-01: 18:00:00 40 meq via ORAL

## 2023-05-01 MED ORDER — ONDANSETRON HCL (PF) 4 MG/2 ML IJ SOLN
4 mg | Freq: Once | INTRAVENOUS | 0 refills | Status: AC | PRN
Start: 2023-05-01 — End: ?

## 2023-05-01 NOTE — Progress Notes
Heart Failure Progress Note    NAME:Raven [name]             MRN: [id number]                 DOB:1946/03/04          AGE: 78 y.o.  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 She follows with Dr. Herma Carson. [name] and [name], APRN.     She presented to the ED on 04/27/23 reporting rectal bleeding, had recently had dark jellylike stool, abdominal discomfort, fatigue, nausea, had recently been diagnosed with RSV therefore was also having coughing symptoms.  Labs revealed stable H&H of 9.5/30.5, NT proBNP 12,625, high-sensitivity troponin levels 33-39-40.  Chest x-ray suggestive of pulmonary edema. Unfortunately Ms. Seader has had 4 hospitalizations over the last month as outlined below; this is her 8th admission over the last 9 months.     Admitted from 11/27-12/4/24 with melena and anemia, received PRBCs and underwent EGD on 12/2 which revealed 1.5 cm pedunculated viable polyp in duodenum which was clipped, Aspirin and Plavix were discontinued and she remained on Eliquis for anticoagulation.  Admitted from 12/6-12/13/24 with hematemesis, melena, symptomatic anemia.  Although her Plavix and aspirin had been discontinued during prior admission, pt reported she continued taking both aspirin and Plavix along with her Eliquis.  She required x4 units of PRBCs. Repeat EGD on 12/8 showed ulcer in duodenum and visible vessel with no active bleeding which was injected with epinephrine and Endo Clip placed.  Admitted from 12/15-12/19/24 for decompensated heart failure, required IV diuresis. It was noted that her milrinone infusion had been off for unknown amount of time.  For ongoing anemia she required x1 unit of PRBCs.  Admitted from 12/23-12/27/24 after she presented to HF clinic reporting symptoms of shortness of breath, concerns of hypervolemia and ~10 lbs weight gain.  She received IV diuresis, had developed a cough which was later identified as RSV when she was seen by her local ED.    Recommendations 05/01/23:  Chronic cardiogenic shock: continue home milrinone infusion at 0.3 mcg/kg/min  Diuretics: increase bumex to 3mg  IV  BID today.  May need to dose with metolazone tomorrow if not better response  HFrEF GDMT: limited with CKD and hx cardiogenic shock. Toprol XL stopped during hospitalization 12/23-12/27. Will not re-add at this time. Previously on [name] which was recently dc'd, defer resumption at this time with GFR 20  Paroxsymal AFib: Continue PTA Amiodarone 200 mg daily, PTA Eliquis remains on hold with concerns for GIB->resume soon. Messaged Watchman navigator 04/29/23 to schedule appointment to discuss Watchman outpatient.   CAD: continue PTA Rosuvastatin 10 mg daily  Hx GIB: H/H remains stable; monitor closely for signs of bleeding. GI following. Colonoscopy 1/3 revealed no acute findings.  We will continue to follow.     Ongoing:  BMP once a day. Mg level daily. Maintain K+ over 4.0 and Mg+ over 2.0   2000mg  sodium dietary restriction.  Fluid Restriction:1.5  Strict I/O. Goal output: net neg 2L/24 hour  Daily standing scale weight. Goal Dry Weight: unknown.  Previously thought to be 165 pounds   Please place a specimen in lab NT-proBNP order on day of discharge in effort to establish baseline value correlating with suspected euvolemia.    Primary team responsible for placing Post-Discharge Health System Appointment Request order set for Cardiology: Heart Failure appointment request within 24-48 hours of discharge. (Please do not place order any earlier in effort to reduce possible need for cancellation and rescheduling of visit).      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   She presents with signs of hypervolemia with Left  ventricular failure with signs of low flow state.    BNP Labs:   Lab Results   Component Value Date    NTPROBNP 12,625 (H) 04/27/2023    NTPROBNP 11,636 (H) 04/19/2023    NTPROBNP 11,324 (H) 04/12/2023      High Sensitivity Troponin:  Lab Results   Component Value Date    HSTROP0HR 33 (H) 04/27/2023    HSTROP2HR 39 (H) 04/27/2023    HSTROP4HR 40 (H) 04/27/2023    HSTROPDELT4 1 04/27/2023    HSTROPDELTA -2 04/19/2023    HIGHSTROPI 28 (H) 04/12/2023     Admission Weight: 76.2 kg (168 lb)        Most recent weights (inpatient):   Vitals:    04/30/23 0600 04/30/23 1311 05/01/23 0535   Weight: 76.3 kg (168 lb 3.2 oz) 76.2 kg (168 lb) 76.2 kg (168 lb)        Strict I/O:   Last 24 hours: +432mL (no output recorded from yesterday)  Entire hospitalization: +1238mL  Urine output/24 hours: (no output recorded from yesterday AM shift    PLAN:  Diuretic Therapy    Prior to admission dose Torsemide 20 mg BID + Kdur 20 mEq BID      Given on admission 12/31: IV Lasix 60 mg x 1      Daily Dosing 1/1-1/2: Bumex 2 mg IV BID  1/3: bumex 2mg  IV BID  1/4: increase to bumex 3mg  IV BID  Guideline Directed Medical Therapy PTA Inpatient Changes   ACEI/ARB N/A-CKD IV    ARNI N/A-CKD IV    BB N/A-cardiogenic shock on home inotrope; previously tolerated Toprol XL which was discharged during most recent hospitalization.  Will not resume at this time   SGLT-2 Inhibitor N/A- recently on [name] 25 mg/day which was dc'd during 12/15-12/19 admission Defer resumption with CrCl <20 mL/min   Mineralocorticoid Receptor Antagonist N/A-CKD IV    Hydralazine/Nitrate N/A, BP well controlled    Ivabradine N/A, cardiogenic shock    Heart Rhythm Management Therapy Yes (ICD) 04/12/23 interrogation stable, no recent events   Anticoagulation for Afib/flutter apixaban (Eliquis) 5 mg BID Held on admission with concern for GIB   Cardiac Rehab Evaluation for LVEF < or equal to 40% Yes; Date ordered: 04/12/23    7-Day post hospital follow up scheduled within 24-48 hours of discharge No Primary team to schedule a follow up using Post-Discharge Health System Appointment Request Order Set for Cardiology: Heart Failure appt request within 24-48 hours of discharge      Inotrope Therapy Dose & Adjustments  (document symptom and hemodynamic response if pertinent)   Milrinone 0.3 mcg/kg/min Started during hospitalization May 2024 Testing/Procedures   04/12/23 Echo:  Eccentric hypertrophy of the left ventricle with severe dilatation of the left ventricular chamber  Left ventricular systolic function is moderately to severely impaired with an estimate ejection fraction of 30% and global hypokinesis  Right ventricle is moderately dilated with normal function  The left atrium is severely dilated  Pacemaker lead seen in the right atrium and right ventricle  Severe mitral valve insufficiency  Severe tricuspid valve insufficiency  No pericardial effusion  Severe pulmonary hypertension with estimated peak systolic pulmonary artery pressure of 73 mmHg    RHC 09/18/22 (weight 165 pounds)  RA 13  RV 70/12  PA 72/27 with mean 47  PCWP 17  TPG 30  PVR 12  CO/CI 2.5/1.4 by thermo  CO/CI 1.9/1.1 by Fick      Heart Failure Remote Monitoring and/or Heart Failure Research Candidacy   Patient is not an appropriate candidate for any remote monitoring or heart failure research studies due to  end stage HF .    CAD s/p CABG x3 (2010)  - 12/31 admit troponin 33>39>40, EKG without acute ischemic changes   > cont statin therapy; aspirin and Plavix recently dc'd with GIB    S/p ICD  - device evaluation 04/12/23 AP <1%/VP <1%  - ICD interrogation 04/29/23: AP <1%/VP <1% with presenting rhythm AS-VS 90s with elevated HF diagnostics    HLD  Lab Results   Component Value Date    CHOL 107 09/16/2022    TRIG 72 09/16/2022    HDL 50 09/16/2022    LDL 45 09/16/2022    VLDL 14 09/16/2022    NONHDLCHOL 57 09/16/2022   > cont PTA Rosuvastatin 10 mg/day    Paroxysmal AFib  - PTA anticoagulated with Eliquis 5 mg BID held on admission  - cont PTA Amiodarone 200 mg/day  - recommend evaluation for Watchman in outpatient setting     CKD stage IV  - 12/31 admit creat 2.36  - baseline creat ranges 2.4-2.6  Lab Results   Component Value Date    CR 2.39 (H) 05/01/2023    CR 2.24 (H) 04/30/2023    CR 2.39 (H) 04/29/2023   > avoid nephrotoxins, daily labs to follow    Chronic Anemia  Frequent GIB  - 12/31 admit H/H 9.5/30.5  Lab Results   Component Value Date    IRON 25 (L) 04/28/2023    PSAT 17 (L) 04/28/2023    TIBC 148 (L) 04/28/2023    FERRITIN 950 (H) 04/28/2023    > s/p IV Venofer 300 mg x1 on 03/31/23  - 04/30/23: Colonoscopy: no active bleeding or blood->suspect prior bleeding may have been hemorrhoidal vs diverticular based on findings    Hx DM 2  - 04/20/23 HgbA1c 6.3  - no PTA meds; previously on [name] 25 mg daily, this was discontinued during recent admission   > managed per primary team     RSV  - supportive care per primary team    _____________________________________________________________________________  Subjective:   She currently complains of fatigue, shortness of breath, dyspnea on exertion, cough, abdominal fullness, and peripheral edema. She is concerned that her cough hasn't improved as well as her weight is not going down.    She currently denies paroxysmal nocturnal dyspnea, orthopnea, lightheadedness, positional lightheadedness, palpitations, and chest pain.        Objective:                       Vital Signs:  Last Filed                Vital Signs: 24 Hour Range   BP: 111/75 (01/04 0415)  Temp: 36.3 ?C (97.3 ?F) (01/04 9604)  Pulse: 99 (01/04 0415)  Respirations: 20 PER MINUTE (01/04 0415)  SpO2: 99 % (01/04 0415)  O2 Device: CPAP/BiPAP (01/04 0415)  O2 Liter Flow: 3 Lpm (01/03 1505)  Height: 157.5 cm (5' 2) (01/03 1311)  BP: (65-130)/(44-76)   Temp:  [36.2 ?C (97.1 ?F)-36.8 ?C (98.2 ?F)]   Pulse:  [83-103]   Respirations:  [17 PER MINUTE-25 PER MINUTE]   SpO2:  [92 %-99 %]   O2 Device: CPAP/BiPAP  O2 Liter Flow: 3 Lpm           Wt Readings from Last 10 Encounters:   05/01/23 76.2 kg (168 lb)   04/23/23 76 kg (167 lb 9.6 oz)   04/15/23 77.6 kg (171 lb)   04/06/23 75.9 kg (167 lb 5.3 oz)   03/26/23 74.2 kg (163 lb 9.3 oz)   03/19/23 77 kg (169 lb 12.8 oz)   02/16/23 75.3 kg (166 lb)   02/15/23 75.3 kg (166 lb)   01/04/23 75.2 kg (165 lb 12.8 oz)   12/14/22 74.6 kg (164 lb 6.4 oz)     Physical Exam:    General Appearance: no distress  Neck Veins: JVP 10cm, HJR positive (unchanged; she does have severe TR)  Respiratory: Non-labored respiratory pattern on room air, breath sounds diminished in posterior bases  Cardiac Auscultation: Regular rhythm, S1, S2, no S3 or S4, grade 2/6 murmur  Lower Extremity Edema: trace BLE edema to midshins  Abdominal Exam: soft, less distended, non-tender, bowel sounds normal  Orientation: clear historian, good insight    Laboratory Review:   CBC w/Diff    Lab Results   Component Value Date/Time    WBC 7.20 05/01/2023 04:18 AM    RBC 3.05 (L) 05/01/2023 04:18 AM    HGB 8.8 (L) 05/01/2023 04:18 AM    HCT 27.4 (L) 05/01/2023 04:18 AM    MCV 89.8 05/01/2023 04:18 AM    MCH 28.9 05/01/2023 04:18 AM    MCHC 32.1 05/01/2023 04:18 AM    RDW 23.1 (H) 05/01/2023 04:18 AM    PLTCT 264 05/01/2023 04:18 AM  MPV 8.6 05/01/2023 04:18 AM    Lab Results   Component Value Date/Time    NEUT 61 05/01/2023 04:18 AM    ANC 4.40 05/01/2023 04:18 AM    LYMA 21 (L) 05/01/2023 04:18 AM    ALC 1.50 05/01/2023 04:18 AM    MONA 14 (H) 05/01/2023 04:18 AM    AMC 1.00 (H) 05/01/2023 04:18 AM    EOSA 3 05/01/2023 04:18 AM    AEC 0.20 05/01/2023 04:18 AM    BASA 1 05/01/2023 04:18 AM    ABC 0.10 05/01/2023 04:18 AM         Chemistry    Lab Results   Component Value Date/Time    NA 145 05/01/2023 04:18 AM    K 4.0 05/01/2023 04:18 AM    CL 110 05/01/2023 04:18 AM    CO2 25 05/01/2023 04:18 AM    GAP 10 05/01/2023 04:18 AM    BUN 40 (H) 05/01/2023 04:18 AM    CR 2.39 (H) 05/01/2023 04:18 AM    GLU 138 (H) 05/01/2023 04:18 AM    MG 1.9 05/01/2023 04:18 AM    Lab Results   Component Value Date/Time    CA 8.7 05/01/2023 04:18 AM    PO4 4.0 04/27/2023 06:29 PM    ALBUMIN 3.1 (L) 05/01/2023 04:18 AM    TOTPROT 4.9 (L) 05/01/2023 04:18 AM    ALKPHOS 96 05/01/2023 04:18 AM    AST 19 05/01/2023 04:18 AM    ALT 13 05/01/2023 04:18 AM    TOTBILI 0.8 05/01/2023 04:18 AM    GFR 20 (L) 05/01/2023 04:18 AM            Renal Function    Lab Results   Component Value Date/Time    NA 145 05/01/2023 04:18 AM    K 4.0 05/01/2023 04:18 AM    CL 110 05/01/2023 04:18 AM    CO2 25 05/01/2023 04:18 AM    GAP 10 05/01/2023 04:18 AM    BUN 40 (H) 05/01/2023 04:18 AM    BUN 40 (H) 04/30/2023 05:11 AM    BUN 43 (H) 04/29/2023 03:23 AM    Lab Results   Component Value Date/Time    CR 2.39 (H) 05/01/2023 04:18 AM    CR 2.24 (H) 04/30/2023 05:11 AM    CR 2.39 (H) 04/29/2023 03:23 AM    GLU 138 (H) 05/01/2023 04:18 AM    CA 8.7 05/01/2023 04:18 AM    PO4 4.0 04/27/2023 06:29 PM    ALBUMIN 3.1 (L) 05/01/2023 04:18 AM        Lipid Profile INR   Lab Results   Component Value Date    CHOL 107 09/16/2022    TRIG 72 09/16/2022    HDL 50 09/16/2022    LDL 45 09/16/2022    VLDL 14 09/16/2022    NONHDLCHOL 57 09/16/2022         Lab Results   Component Value Date    INR 3.1 (H) 04/27/2023          04/27/23 Chest X-Ray:   Similar cardiac silhouette enlargement with persistent perihilar and basal opacities and vascular indistinctness, favored to represent edema.      Tele:  SR-ST, PVCs, HR 80s-100s, 5 beat run 1/3 @1830     04/27/23 ECG:   SR w/ 1st degree A/V block, occasional PVCs, HR 85 bpm, QTc 504

## 2023-05-01 NOTE — Progress Notes
RT Adult Assessment Note    NAME:Raven [name]             MRN: [id number]             DOB:10-30-1945          AGE: 78 y.o.  ADMISSION DATE: 04/27/2023             DAYS ADMITTED: LOS: 4 days    Additional Comments:  Impressions of the patient: Unlabored breathing. Room air.  Intervention(s)/outcome(s): Duo Neb BID & PRN. Symbicort BID. CPAP at night for OSA.    Vital Signs:  Pulse: 95  RR: 16 PER MINUTE  SpO2: 93 %  O2 Device: None (Room air)  Liter Flow:    O2%:      Breath Sounds:   All Breath Sounds: Expiratory wheezes  Respiratory Effort:   Respiratory WDL: Within Defined Limits  Respiratory Effort/Pattern: Unlabored

## 2023-05-01 NOTE — Progress Notes
[facility] END OF SHIFT/PLAN OF CARE NURSING NOTE   Nursing Shift: Night Shift 1900-0700    Acute events, nursing interventions, & communication with providers:     Pt still having persistent productive cough. Robitussin, tessalon perles, cough drops given. Pt also having stiffness and pain in lower extremities. PRN tylenol and topical cream ordered per Norm Parcel, [facility] first call.       Patient Goal(s)  Patient will Progress toward pain management goal by the end of next shift.        Patient will  Verbalize readiness for discharge by discharge.   Admission Weight: Weight: 76.2 kg (168 lb)    Last 3 Weights:   Vitals:    04/30/23 0600 04/30/23 1311 05/01/23 0535   Weight: 76.3 kg (168 lb 3.2 oz) 76.2 kg (168 lb) 76.2 kg (168 lb)     Weight Change: Weight trend stable    Intake/Output Summary (Last 24 hours) at 05/01/2023 0638  Last data filed at 05/01/2023 0415  Gross per 24 hour   Intake 722 ml   Output 300 ml   Net 422 ml     Last Bowel Movement Date: 04/30/23    Fluid Restriction? No   Quality/Safety    Total Fall Risk Score: 10   Risk for Injury related to falls: Standard risk for injury based on the ABCS scoring tool  Fall Risk Category:   History of More Than One Fall Within 6 Months Before Admission: No  Elimination, Bowel and Urine: N/A  Interventions: N/A - Does not score for risk in this category  Medications: On 2 or more high fall risk drugs  Interventions: Educate patient on medication side effects  Patient Care Equipment: One present  Interventions: Needs assistance with patient care equipment when ambulating  Mobility: 2 - Assistance required  Interventions: Assist x1  Cognition: 0 - No cognition issues  Interventions: N/A - Does not score as risk in this category    Other safety precautions in place: CLABSI Prevention Bundle    Restraints:  No      Patient Education  This RN provided education to Patient today. The following education topics were reviewed:  Quality/Safety Education:   CLABSI prevention and Pain scale  Medication Education:   Pharmacological pain intervention  Education provided on the following medication(s): Tylenol, topical pain cream   Cardiac - Specific Education:   Heart failure management  General Education:   Glucose management    The following teaching method(s) were used: Verbal  Response to learning: Verbalizes Understanding  Needs reinforcement on: Pt verbalizes understanding

## 2023-05-01 NOTE — Progress Notes
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    Benign essential hypertension    Cardiomyopathy, ischemic    Paroxysmal A-fib (HCC)    ICD (implantable cardioverter-defibrillator) in place    Persistent atrial fibrillation (HCC)    Acute on chronic HFrEF (heart failure with reduced ejection fraction) (HCC)      Assessment and Plan   Principal Problem:    GI bleed  Active Problems:    Multiple myeloma (HCC)    Receiving inotropic medication    Coronary artery disease involving native coronary artery of native heart without angina pectoris    Type 2 diabetes mellitus without complications (HCC)    Chronic kidney disease, stage 4 (severe) (HCC)    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     Of note, this is Ms. Neuroth's eighth hospitalization at [place] this year. Recurrent hospitalizations for HF exacerbation, GI bleeds.      Hematochezia  -Patient endorses 1 day history of bright red blood per rectum  -Hemoglobin on admission 9.5 which appears to be around baseline  -12/2 EGD: 1.5 cm friable polyp in duodenum s/p clip x2, no bleeding seen   -12/8 EGD with ulcer in duodenum and visible vessel, no active bleeding. Endo Clip placed.  -Patient currently hemodynamically stable  -Hematochezia possibly secondary to diverticular bleed versus angiodysplasia versus hemorrhoidal bleed  Plan  -Consult GI. Colonoscopy done on April 30, 2023.  No blood or dark activity on antigen.  Hemorrhoidal versus diverticular.  Retained endoscopically in ascending colon.  Patent end-to-end close anastomosis clear about visible suture and healthy-appearing was appear diverticulosis in sigmoid colon.  Nonbleeding external/internal hemorrhoids.  Otherwise normal.  -H&H stable in 9s  -Obtain type and cross  -Hold Eliquis      Cough  RSV infection  -Patient endorses nonproductive cough and shortness of breath  -Tested positive for RSV in the ED  -Chest x-ray revealed similar cardiac silhouette enlargement with persistent perihilar and basilar opacities and vascular destructiveness favored to represent edema  Plan  -  Continue with supportive therapy for now. Patient on room air   -Cough suppressants added     Acute on chronic combined systolic and diastolic heart failure  On chronic milrinone  Ischemic cardiomyopathy  CAD s/p CABG and PCI  CRT-D in situ  PAF  -Recent hospitalization 12/23-27 with HF exacerbation.   -Echo from 04/12/2023 revealed EF of 30% with grade 3 diastolic dysfunction and severe mitral and tricuspid valve regurgitation  -EKG revealed sinus rhythm with first-degree AV block and PVCs  -NT proBNP elevated at 12K. Has been >10k at baseline. Most recently 11,636 04/19/23  -Chest x-ray revealed pulmonary edema  -Suspect shortness of breath likely has a component of heart failure exacerbation  -PTA on torsemide 20 mg BID  Plan  -HF following. Continue IV Bumex 3 mg twice daily.  Planning to start metolazone tomorrow if not better response.  -Daily weight and I&O  -Resume PTA milrinone  -Resume PTA amiodarone for PAF  -Holding Eliquis due to suspected GI bleed. Discussed with cardiology, may need to consider stopping eliquis with recurrent GI bleeds.  Cardiology is planning to schedule Watchman procedure as an outpatient.  -Interrogate device     CKD stage IV  -Baseline creatinine appears to be anywhere between 2.3-2.6  -On admission creatinine 2.36  Plan  -Monitor renal function closely while diuresing.   -Daily CMP     Multiple myeloma  -Follows at [facility] and currently on Daratumumab  -Continue PTA acyclovir for prophylaxis  -Follow-up with hematology as outpatient     DM type II  -Most recent hemoglobin A1c from 04/20/2023 was 6.3  -Will start low-dose correction factor     COPD  Pulmonary nodules  -Continue PTA albuterol and Symbicort  -Follow-up for pulmonary nodules recommended after 6 to 8 weeks     Pancreatic cyst  -CT abdomen/pelvis from 04/02/2023 revealed mild increase in size of indeterminant large lobulated cystic mass in the tail of the pancreas  -Endoscopic ultrasound recommended  -Follow-up with GI as outpatient                    FEN: no IVF, electrolytes reviewed. CLD   Prophylaxis:  Hold due to concern for GI bleed  Disposition: Admit to medicine.    Code status: Full code (Discussed on 04/27/2023)        Diet: ADVANCE DIET AS TOLERATED  DIET CARDIAC (LOW FAT/LOW NA) & DIABETIC  DVT PPx:  SCD   Code Status: Full Code  Dispo: Continue inpatient admission   PCP: Barbaraann Rondo  Payor: UHC MEDICARE / Plan: UHC MEDICARE REPLACEMENT - LIFE1 / Product Type: Medicare /   Future Appointments   Date Time Provider Department Center   09/16/2023 1:30 PM Vanetta Shawl I, MD [facility] CVM Exam   09/17/2023  2:30 PM [facility] PACEMAKER [facility] CVM Procedur   09/17/2023  3:00 PM Emert, Catalina Pizza, MD [facility] Exam               Patient was discussed at multidisciplinary rounds which included nurse, pharmacist, social work, and nurse case management who provided additional information that affected the patient's care.            Miguel Aschoff, MD  Clinical Assistant Professor   Department of Internal Medicine  Service: Med Private     Amie Critchley is the preferred method of communication.   Please use the Med Private First Call for all patient-related communications.   Personal Voaltes and pagers are not  answered at all hours.    Total Time Today was 61 minutes in the following activities: Preparing to see the patient, Obtaining and/or reviewing separately obtained history, Performing a medically appropriate examination and/or evaluation, Counseling and educating the patient/family/caregiver, Ordering medications, tests, or procedures, Referring and communication with other health care professionals (when not separately reported), Documenting clinical information in the electronic or other health record, Independently interpreting results (not separately reported) and communicating results to the patient/family/caregiver, and Care coordination (not separately reported)      Subjective     Pt seen today.   No acute event overnight.   No new concern.   Oral intake adequate.     Pt  is hemodynamically stable. Afebrile in last 24 hours. at room air and maintaining  sat.     No  fever chills nausea vomiting chest pain shortness of breath or abdominal pain.       Patient communicated understanding and support of plan. No additional complaints.       Objective:       Vital Signs:  BP 119/58 (BP Source: Arm, Right Upper)  - Pulse 102  - Temp 36.8 ?C (98.2 ?F)  - Ht 157.5 cm (5' 2)  - Wt 76.2 kg (168 lb)  - SpO2 93%  - BMI 30.73 kg/m?     Physical Exam  HENT:      Head: Normocephalic.      Nose: Nose normal.      Mouth/Throat:      Pharynx: Oropharynx is clear.   Eyes:      Conjunctiva/sclera: Conjunctivae normal.   Cardiovascular:      Rate and Rhythm: Normal rate and regular rhythm.      Pulses: Normal pulses.      Heart sounds: Normal heart sounds.   Pulmonary:      Effort: Pulmonary effort is normal.      Breath sounds: Normal breath sounds.   Abdominal:      Palpations: Abdomen is soft.   Musculoskeletal:         General: Normal range of motion.      Cervical back: Normal range of motion.      Right lower leg: Edema present.      Left lower leg: Edema present.   Skin:     General: Skin is warm.   Neurological:      General: No focal deficit present.      Mental Status: She is alert.   Psychiatric:         Mood and Affect: Mood normal.         Behavior: Behavior normal.         Allergies:  Bortezomib, Allopurinol, and Sulfa (sulfonamide antibiotics)    Intake/Output Summary:  (Last 24 hours)    Intake/Output Summary (Last 24 hours) at 05/01/2023 1227  Last data filed at 05/01/2023 1205  Gross per 24 hour   Intake 1905.33 ml   Output 700 ml   Net 1205.33 ml           Labs:  Recent Labs     04/29/23  0323 04/30/23  0511 05/01/23  0418   HGB 9.4* 9.0* 8.8*   HCT 29.5* 27.2* 27.4*   WBC 8.00 7.60 7.20   PLTCT 236 215 264   NA 143 141 145   K 5.0 3.7 4.0   CL 108 106 110   CO2 25 23 25    BUN 43* 40* 40*   CR 2.39*  2.24* 2.39*   GLU 134* 100 138*   CA 8.5 8.3* 8.7   MG 2.0  --  1.9   ALBUMIN 3.3* 3.1* 3.1*   TOTPROT 5.4* 4.9* 4.9*   TOTBILI 0.9 0.9 0.8   AST 19 16 19    ALT 17 14 13    ALKPHOS 108 95 96   FSBS (Manual): (!) 170 (05/01/23 1218)  Glucose: (!) 138 (05/01/23 0418)  POC Glucose (Download): (!) 170 (05/01/23 1218)  BNP: No results for input(s): BNP in the last 72 hours.  CARDIAC ENZYMES: No results for input(s): CKMB in the last 72 hours.      Medications:  No current outpatient medications on file.       Recent Imaging  No results found.          Miguel Aschoff, MD  Clinical Assistant Professor   Department of Internal Medicine  Service: Med Private     Amie Critchley is the preferred method of communication.   Please use the Med Private First Call for all patient-related communications.   Personal Voaltes and pagers are not answered at all hours.

## 2023-05-02 ENCOUNTER — Encounter: Admit: 2023-05-02 | Discharge: 2023-05-02 | Payer: MEDICARE

## 2023-05-02 LAB — POC GLUCOSE
~~LOC~~ BKR POC GLUCOSE: 132 mg/dL — ABNORMAL HIGH (ref 70–100)
~~LOC~~ BKR POC GLUCOSE: 138 mg/dL — ABNORMAL HIGH (ref 70–100)
~~LOC~~ BKR POC GLUCOSE: 158 mg/dL — ABNORMAL HIGH (ref 70–100)
~~LOC~~ BKR POC GLUCOSE: 95 mg/dL (ref 70–100)

## 2023-05-02 LAB — ECG 12-LEAD
P AXIS: 76 degrees
P-R INTERVAL: 192 ms
Q-T INTERVAL: 382 ms
QRS DURATION: 118 ms
QTC CALCULATION (BAZETT): 492 ms
R AXIS: -22 degrees
T AXIS: 80 degrees
VENTRICULAR RATE: 100 {beats}/min

## 2023-05-02 MED ORDER — POTASSIUM CHLORIDE 20 MEQ PO TBTQ
80 meq | Freq: Once | ORAL | 0 refills | Status: CP
Start: 2023-05-02 — End: ?
  Administered 2023-05-02: 15:00:00 80 meq via ORAL

## 2023-05-02 MED ORDER — METOLAZONE 5 MG PO TAB
5 mg | Freq: Once | ORAL | 0 refills | Status: CP
Start: 2023-05-02 — End: ?
  Administered 2023-05-02: 15:00:00 5 mg via ORAL

## 2023-05-02 NOTE — Progress Notes
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    Benign essential hypertension    Cardiomyopathy, ischemic    Paroxysmal A-fib (HCC)    ICD (implantable cardioverter-defibrillator) in place    Persistent atrial fibrillation (HCC)    Acute on chronic HFrEF (heart failure with reduced ejection fraction) (HCC)      Assessment and Plan   Principal Problem:    GI bleed  Active Problems:    Multiple myeloma (HCC)    Receiving inotropic medication    Coronary artery disease involving native coronary artery of native heart without angina pectoris    Type 2 diabetes mellitus without complications (HCC)    Chronic kidney disease, stage 4 (severe) (HCC)    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     Of note, this is [name]'s eighth hospitalization at [facility] this year. Recurrent hospitalizations for HF exacerbation, GI bleeds.      Hematochezia  -Patient endorses 1 day history of bright red blood per rectum  -Hemoglobin on admission 9.5 which appears to be around baseline  -12/2 EGD: 1.5 cm friable polyp in duodenum s/p clip x2, no bleeding seen   -12/8 EGD with ulcer in duodenum and visible vessel, no active bleeding. Endo Clip placed.  -Patient currently hemodynamically stable  -Hematochezia possibly secondary to diverticular bleed versus angiodysplasia versus hemorrhoidal bleed  Plan  -Consult GI. Colonoscopy done on April 30, 2023.  No blood or dark activity on antigen.  Hemorrhoidal versus diverticular.  Retained endoscopically in ascending colon.  Patent end-to-end close anastomosis clear about visible suture and healthy-appearing was appear diverticulosis in sigmoid colon.  Nonbleeding external/internal hemorrhoids.  Otherwise normal.  -H&H stable in 9s  -Obtain type and cross  -Hold Eliquis      Cough  RSV infection  -Patient endorses nonproductive cough and shortness of breath  -Tested positive for RSV in the ED  -Chest x-ray revealed similar cardiac silhouette enlargement with persistent perihilar and basilar opacities and vascular destructiveness favored to represent edema  Plan  -  Continue with supportive therapy for now. Patient on room air   -Cough suppressants added     Acute on chronic combined systolic and diastolic heart failure  On chronic milrinone  Ischemic cardiomyopathy  CAD s/p CABG and PCI  CRT-D in situ  PAF  -Recent hospitalization 12/23-27 with HF exacerbation.   -Echo from 04/12/2023 revealed EF of 30% with grade 3 diastolic dysfunction and severe mitral and tricuspid valve regurgitation  -EKG revealed sinus rhythm with first-degree AV block and PVCs  -NT proBNP elevated at 12K. Has been >10k at baseline. Most recently 11,636 04/19/23  -Chest x-ray revealed pulmonary edema  -Suspect shortness of breath likely has a component of heart failure exacerbation  -PTA on torsemide 20 mg BID  Plan  -HF following. Continue IV Bumex 3 mg twice daily. Hr baselin weight is 160- her weight is 168 today. UOP=800    IV metolzone 5 mg given today.   -Daily weight and I&O  -Resume PTA milrinone  -Resume PTA amiodarone for PAF  -Holding Eliquis due to suspected GI bleed. Discussed with cardiology, may need to consider stopping eliquis with recurrent GI bleeds.  Cardiology is planning to schedule Watchman procedure as an outpatient.  -Interrogate device     CKD stage IV  -Baseline creatinine appears to be anywhere between 2.3-2.6  -On admission creatinine 2.36  Plan  -Monitor renal function closely while diuresing.   -Daily CMP     Multiple myeloma  -Follows at [facility] and currently on Daratumumab  -Continue PTA acyclovir for prophylaxis  -Follow-up with hematology as outpatient     DM type II  -Most recent hemoglobin A1c from 04/20/2023 was 6.3  -Will start low-dose correction factor     COPD  Pulmonary nodules  -Continue PTA albuterol and Symbicort  -Follow-up for pulmonary nodules recommended after 6 to 8 weeks     Pancreatic cyst  -CT abdomen/pelvis from 04/02/2023 revealed mild increase in size of indeterminant large lobulated cystic mass in the tail of the pancreas  -Endoscopic ultrasound recommended  -Follow-up with GI as outpatient                    FEN: no IVF, electrolytes reviewed. CLD   Prophylaxis:  Hold due to concern for GI bleed  Disposition: Admit to medicine.    Code status: Full code (Discussed on 04/27/2023)        Diet: ADVANCE DIET AS TOLERATED  DIET CARDIAC (LOW FAT/LOW NA) & DIABETIC  DVT PPx:  SCD   Code Status: Full Code  Dispo: Continue inpatient admission   PCP: Barbaraann Rondo  Payor: UHC MEDICARE / Plan: UHC MEDICARE REPLACEMENT - LIFE1 / Product Type: Medicare /   Future Appointments   Date Time Provider Department Center   09/16/2023  1:30 PM Vanetta Shawl I, MD [facility] CVM Exam   09/17/2023  2:30 PM [facility]3 PACEMAKER [facility] CVM Procedur   09/17/2023  3:00 PM Emert, Catalina Pizza, MD [facility] CVM Exam               Patient was discussed at multidisciplinary rounds which included nurse, pharmacist, social work, and nurse case management who provided additional information that affected the patient's care.            Miguel Aschoff, MD  Clinical Assistant Professor   Department of Internal Medicine  Service: Med Private     Amie Critchley is the preferred method of communication.   Please use the Med Private First Call  for all patient-related communications.   Personal Voaltes and pagers are not answered at all hours.    Total Time Today was 50 minutes in the following activities: Preparing to see the patient, Obtaining and/or reviewing separately obtained history, Performing a medically appropriate examination and/or evaluation, Counseling and educating the patient/family/caregiver, Ordering medications, tests, or procedures, Referring and communication with other health care professionals (when not separately reported), Documenting clinical information in the electronic or other health record, Independently interpreting results (not separately reported) and communicating results to the patient/family/caregiver, and Care coordination (not separately reported)      Subjective   Pt seen today.   No acute event overnight.   No new concern.   Oral intake adequate.     Pt  is hemodynamically stable. Afebrile in last 24 hours. at room air and maintaining  sat.     No  fever chills nausea vomiting chest pain shortness of breath or abdominal pain.         Patient communicated understanding and support of plan. No additional complaints.       Objective:       Vital Signs:  BP 119/71 (BP Source: Arm, Right Upper)  - Pulse 95  - Temp 36.9 ?C (98.4 ?F)  - Ht 157.5 cm (5' 2)  - Wt 76.3 kg (168 lb 3.2 oz)  - SpO2 93%  - BMI 30.76 kg/m? Physical Exam  HENT:      Head: Normocephalic.      Nose: Nose normal.      Mouth/Throat:      Pharynx: Oropharynx is clear.   Eyes:      Conjunctiva/sclera: Conjunctivae normal.   Cardiovascular:      Rate and Rhythm: Normal rate and regular rhythm.      Pulses: Normal pulses.      Heart sounds: Normal heart sounds.   Pulmonary:      Effort: Pulmonary effort is normal.      Breath sounds: Normal breath sounds.   Abdominal:      Palpations: Abdomen is soft.   Musculoskeletal:         General: Normal range of motion.      Cervical back: Normal range of motion.      Right lower leg: Edema present.      Left lower leg: Edema present.   Skin:     General: Skin is warm.   Neurological:      General: No focal deficit present.      Mental Status: She is alert.   Psychiatric:         Mood and Affect: Mood normal.         Behavior: Behavior normal.         Allergies:  Bortezomib, Allopurinol, and Sulfa (sulfonamide antibiotics)    Intake/Output Summary:  (Last 24 hours)    Intake/Output Summary (Last 24 hours) at 05/02/2023 1110  Last data filed at 05/02/2023 0837  Gross per 24 hour   Intake 1053.35 ml   Output 1325 ml   Net -271.65 ml           Labs:  Recent Labs     04/30/23  0511 05/01/23  0418 05/02/23  0303   HGB 9.0* 8.8* 8.6*   HCT 27.2* 27.4* 27.4*   WBC 7.60 7.20 7.20   PLTCT 215 264 288   NA 141 145 143   K 3.7 4.0 3.5   CL 106 110 108   CO2 23 25 25  BUN 40* 40* 35*   CR 2.24* 2.39* 2.41*   GLU 100 138* 90   CA 8.3* 8.7 8.5   MG  --  1.9 2.0   ALBUMIN 3.1* 3.1* 3.2*   TOTPROT 4.9* 4.9* 5.0*   TOTBILI 0.9 0.8 0.8   AST 16 19 16    ALT 14 13 13    ALKPHOS 95 96 96   FSBS (Manual): 95 (05/02/23 0819)  Glucose: 90 (05/02/23 0303)  POC Glucose (Download): 95 (05/02/23 0819)  BNP: No results for input(s): BNP in the last 72 hours.  CARDIAC ENZYMES: No results for input(s): CKMB in the last 72 hours.      Medications:  No current outpatient medications on file.       Recent Imaging  No results found.          Miguel Aschoff, MD  Clinical Assistant Professor   Department of Internal Medicine  Service: Med Private     Amie Critchley is the preferred method of communication.   Please use the Med Private First Call for all patient-related communications.   Personal Voaltes and pagers are not answered at all hours.

## 2023-05-02 NOTE — Progress Notes
Heart Failure Progress Note    NAME:Raven [name]             MRN: [id number]                 DOB:02/27/1946          AGE: 78 y.o.  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 She follows with Dr. Herma Carson. [name] and [name], APRN.     She presented to the ED on 04/27/23 reporting rectal bleeding, had recently had dark jellylike stool, abdominal discomfort, fatigue, nausea, had recently been diagnosed with RSV therefore was also having coughing symptoms.  Labs revealed stable H&H of 9.5/30.5, NT proBNP 12,625, high-sensitivity troponin levels 33-39-40.  Chest x-ray suggestive of pulmonary edema. Unfortunately Ms. Kokal has had 4 hospitalizations over the last month as outlined below; this is her 8th admission over the last 9 months.     Admitted from 11/27-12/4/24 with melena and anemia, received PRBCs and underwent EGD on 12/2 which revealed 1.5 cm pedunculated viable polyp in duodenum which was clipped, Aspirin and Plavix were discontinued and she remained on Eliquis for anticoagulation.  Admitted from 12/6-12/13/24 with hematemesis, melena, symptomatic anemia.  Although her Plavix and aspirin had been discontinued during prior admission, pt reported she continued taking both aspirin and Plavix along with her Eliquis.  She required x4 units of PRBCs. Repeat EGD on 12/8 showed ulcer in duodenum and visible vessel with no active bleeding which was injected with epinephrine and Endo Clip placed.  Admitted from 12/15-12/19/24 for decompensated heart failure, required IV diuresis. It was noted that her milrinone infusion had been off for unknown amount of time.  For ongoing anemia she required x1 unit of PRBCs.  Admitted from 12/23-12/27/24 after she presented to HF clinic reporting symptoms of shortness of breath, concerns of hypervolemia and ~10 lbs weight gain.  She received IV diuresis, had developed a cough which was later identified as RSV when she was seen by her local ED.    Recommendations 05/02/23:  Chronic cardiogenic shock: continue home milrinone infusion at 0.3 mcg/kg/min  Diuretics: continue bumex 3mg  IV BID  today. Dose with metolazone 5mg  po x 1 today.  HFrEF GDMT: limited with CKD and hx cardiogenic shock. Toprol XL stopped during hospitalization 12/23-12/27. Will not re-add at this time. Previously on [name] which was recently dc'd, defer resumption at this time with GFR 20  Paroxsymal AFib: Continue PTA Amiodarone 200 mg daily, PTA Eliquis remains on hold with concerns for GIB. Messaged Watchman navigator 04/29/23 to schedule appointment to discuss Watchman outpatient.   CAD: continue PTA Rosuvastatin 10 mg daily  Hx GIB: H/H remains stable; monitor closely for signs of bleeding. GI following. Colonoscopy 1/3 revealed no acute findings.  We will continue to follow.     Ongoing:  BMP once a day. Mg level daily. Maintain K+ over 4.0 and Mg+ over 2.0   2000mg  sodium dietary restriction.  Fluid Restriction:1.5  Strict I/O. Goal output: net neg 2L/24 hour  Daily standing scale weight. Goal Dry Weight: unknown.  Previously thought to be 165 pounds   Please place a specimen in lab NT-proBNP order on day of discharge in effort to establish baseline value correlating with suspected euvolemia.    Primary team responsible for placing Post-Discharge Health System Appointment Request order set for Cardiology: Heart Failure appointment request within 24-48 hours of discharge. (Please do not place order any earlier in effort to reduce possible need for cancellation and rescheduling of visit).      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   She presents with signs of hypervolemia with Left  ventricular failure with signs of low flow state.    BNP Labs:   Lab Results   Component Value Date    NTPROBNP 12,625 (H) 04/27/2023    NTPROBNP 11,636 (H) 04/19/2023    NTPROBNP 11,324 (H) 04/12/2023      High Sensitivity Troponin:  Lab Results   Component Value Date    HSTROP0HR 33 (H) 04/27/2023    HSTROP2HR 39 (H) 04/27/2023    HSTROP4HR 40 (H) 04/27/2023    HSTROPDELT4 1 04/27/2023    HSTROPDELTA -2 04/19/2023    HIGHSTROPI 28 (H) 04/12/2023     Admission Weight: 76.2 kg (168 lb)        Most recent weights (inpatient):   Vitals:    04/30/23 1311 05/01/23 0535 05/02/23 0310   Weight: 76.2 kg (168 lb) 76.2 kg (168 lb) 76.3 kg (168 lb 3.2 oz)        Strict I/O:   Last 24 hours: +237mL   Entire hospitalization: +2044mL  Urine output/24 hours:     PLAN:  Diuretic Therapy    Prior to admission dose Torsemide 20 mg BID + Kdur 20 mEq BID      Given on admission 12/31: IV Lasix 60 mg x 1      Daily Dosing 1/1-1/2: Bumex 2 mg IV BID  1/3: bumex 2mg  IV BID  1/4: increase to bumex 3mg  IV BID     Guideline Directed Medical Therapy PTA Inpatient Changes   ACEI/ARB N/A-CKD IV    ARNI  N/A-CKD IV    BB N/A-cardiogenic shock on home inotrope; previously tolerated Toprol XL which was discharged during most recent hospitalization.  Will not resume at this time   SGLT-2 Inhibitor N/A- recently on [name] 25 mg/day which was dc'd during 12/15-12/19 admission Defer resumption with CrCl <20 mL/min   Mineralocorticoid Receptor Antagonist N/A-CKD IV    Hydralazine/Nitrate N/A, BP well controlled    Ivabradine N/A, cardiogenic shock    Heart Rhythm Management Therapy Yes (ICD) 04/12/23 interrogation stable, no recent events   Anticoagulation for Afib/flutter apixaban (Eliquis) 5 mg BID Held on admission with concern for GIB   Cardiac Rehab Evaluation for LVEF < or equal to 40% Yes; Date ordered: 04/12/23    7-Day post hospital follow up scheduled within 24-48 hours of discharge No Primary team to schedule a follow up using Post-Discharge Health System Appointment Request Order Set for Cardiology: Heart Failure appt request within 24-48 hours of discharge      Inotrope Therapy Dose & Adjustments  (document symptom and hemodynamic response if pertinent)   Milrinone 0.3 mcg/kg/min Started during hospitalization May 2024     Testing/Procedures   04/12/23 Echo:  Eccentric hypertrophy of the left ventricle with severe dilatation of the left ventricular chamber  Left ventricular systolic function is moderately to severely impaired with an estimate ejection fraction of 30% and global hypokinesis  Right ventricle is moderately dilated with normal function  The left atrium is severely dilated  Pacemaker lead seen in the right atrium and right ventricle  Severe mitral valve insufficiency  Severe tricuspid valve insufficiency  No pericardial effusion  Severe pulmonary hypertension with estimated peak systolic pulmonary artery pressure of 73 mmHg    RHC 09/18/22 (weight 165 pounds)  RA 13  RV 70/12  PA 72/27 with mean 47  PCWP 17  TPG 30  PVR 12  CO/CI 2.5/1.4 by thermo  CO/CI 1.9/1.1 by Fick      Heart Failure Remote Monitoring and/or Heart Failure Research Candidacy   Patient is not an appropriate candidate for any remote monitoring or heart failure research studies due to  end stage HF .    CAD s/p CABG x3 (2010)  - 12/31 admit troponin 33>39>40, EKG without acute ischemic changes   > cont statin therapy; aspirin and Plavix recently dc'd with GIB    S/p ICD  - device evaluation 04/12/23 AP <1%/VP <1%  - ICD interrogation 04/29/23: AP <1%/VP <1% with presenting rhythm AS-VS 90s with elevated HF diagnostics    HLD  Lab Results   Component Value Date    CHOL 107 09/16/2022    TRIG 72 09/16/2022    HDL 50 09/16/2022    LDL 45 09/16/2022    VLDL 14 09/16/2022    NONHDLCHOL 57 09/16/2022   > cont PTA Rosuvastatin 10 mg/day    Paroxysmal AFib  - PTA anticoagulated with Eliquis 5 mg BID held on admission  - cont PTA Amiodarone 200 mg/day  - recommend evaluation for Watchman in outpatient setting     CKD stage IV  - 12/31 admit creat 2.36  - baseline creat ranges 2.4-2.6  Lab Results   Component Value Date    CR 2.41 (H) 05/02/2023    CR 2.39 (H) 05/01/2023    CR 2.24 (H) 04/30/2023   > avoid nephrotoxins, daily labs to follow    Chronic Anemia  Frequent GIB  - 12/31 admit H/H 9.5/30.5  Lab Results   Component Value Date    IRON  25 (L) 04/28/2023    PSAT 17 (L) 04/28/2023    TIBC 148 (L) 04/28/2023    FERRITIN 950 (H) 04/28/2023    > s/p IV Venofer 300 mg x1 on 03/31/23  - 04/30/23: Colonoscopy: no active bleeding or blood->suspect prior bleeding may have been hemorrhoidal vs diverticular based on findings    Hx DM 2  - 04/20/23 HgbA1c 6.3  - no PTA meds; previously on [name] 25 mg daily, this was discontinued during recent admission   > managed per primary team     RSV  - supportive care per primary team    _____________________________________________________________________________  Subjective:   She currently complains of fatigue, dyspnea on exertion, productive cough, and peripheral edema. She is up in the chair, reports that overall, she believes she is feeling better.  Discussed the addition of metolazone today.    She currently denies paroxysmal nocturnal dyspnea, orthopnea, lightheadedness, positional lightheadedness, palpitations, and chest pain.        Objective:                       Vital Signs:  Last Filed                Vital Signs: 24 Hour Range   BP: 127/60 (01/05 0306)  Temp: 36.7 ?C (98.1 ?F) (01/05 5956)  Pulse: 91 (01/05 0400)  Respirations: 18 PER MINUTE (01/05 0306)  SpO2: 92 % (01/05 0306)  O2%: 21 % (01/04 2347)  O2 Device: CPAP/BiPAP (01/05 0306)  BP: (119-129)/(58-79)   Temp:  [36.6 ?C (97.9 ?F)-36.8 ?C (98.3 ?F)]   Pulse:  [90-106]   Respirations:  [16 PER MINUTE-20 PER MINUTE]   SpO2:  [92 %-96 %]   O2%:  [21 %]   O2 Device: CPAP/BiPAP    Intensity Pain Scale (Self Report): 7 (05/01/23 2151)      Wt Readings from Last 10 Encounters:   05/02/23 76.3 kg (168 lb 3.2 oz)   04/23/23 76 kg (167 lb 9.6 oz)   04/15/23 77.6 kg (171 lb)   04/06/23 75.9 kg (167 lb 5.3 oz)   03/26/23 74.2 kg (163 lb 9.3 oz)   03/19/23 77 kg (169 lb 12.8 oz)   02/16/23 75.3 kg (166 lb)   02/15/23 75.3 kg (166 lb)   01/04/23 75.2 kg (165 lb 12.8 oz)   12/14/22 74.6 kg (164 lb 6.4 oz)     Physical Exam:    General Appearance: no distress  Neck Veins: JVP 7cm, HJR positive sitting upright in chair, (she does have severe TR)  Respiratory: coarse with expiratory wheezes thoughtout posteriorly, coarseness clears somewhat with cough  Cardiac Auscultation: Regular rhythm, S1, S2, no S3 or S4, grade 2/6 murmur  Lower Extremity Edema: 1+ BLE edema to midshins  Abdominal Exam: soft, less distended, non-tender, bowel sounds normal  Orientation: clear historian, good insight    Laboratory Review:   CBC w/Diff    Lab Results   Component Value Date/Time    WBC 7.20 05/02/2023 03:03 AM    RBC 3.07 (L) 05/02/2023 03:03 AM    HGB 8.6 (L) 05/02/2023 03:03 AM    HCT 27.4 (L) 05/02/2023 03:03 AM    MCV 89.3 05/02/2023 03:03 AM    MCH 28.0 05/02/2023 03:03 AM    MCHC 31.3 (L) 05/02/2023 03:03 AM    RDW 23.5 (H) 05/02/2023 03:03 AM    PLTCT 288 05/02/2023 03:03 AM    MPV 8.5 05/02/2023 03:03 AM  Lab Results   Component Value Date/Time    NEUT 62 05/02/2023 03:03 AM    ANC 4.50 05/02/2023 03:03 AM    LYMA 20 (L) 05/02/2023 03:03 AM    ALC 1.40 05/02/2023 03:03 AM    MONA 12 05/02/2023 03:03 AM    AMC 0.80 05/02/2023 03:03 AM    EOSA 5 05/02/2023 03:03 AM    AEC 0.40 05/02/2023 03:03 AM    BASA 1 05/02/2023 03:03 AM    ABC 0.10 05/02/2023 03:03 AM         Chemistry    Lab Results   Component Value Date/Time    NA 143 05/02/2023 03:03 AM    K 3.5 05/02/2023 03:03 AM    CL 108 05/02/2023 03:03 AM    CO2 25 05/02/2023 03:03 AM    GAP 10 05/02/2023 03:03 AM    BUN 35 (H) 05/02/2023 03:03 AM    CR 2.41 (H) 05/02/2023 03:03 AM    GLU 90 05/02/2023 03:03 AM    MG 2.0 05/02/2023 03:03 AM    Lab Results   Component Value Date/Time    CA 8.5 05/02/2023 03:03 AM    PO4 4.0 04/27/2023 06:29 PM    ALBUMIN 3.2 (L) 05/02/2023 03:03 AM    TOTPROT 5.0 (L) 05/02/2023 03:03 AM    ALKPHOS 96 05/02/2023 03:03 AM    AST 16 05/02/2023 03:03 AM    ALT 13 05/02/2023 03:03 AM    TOTBILI 0.8 05/02/2023 03:03 AM    GFR 20 (L) 05/02/2023 03:03 AM            Renal Function    Lab Results   Component Value Date/Time    NA 143 05/02/2023 03:03 AM    K 3.5 05/02/2023 03:03 AM    CL 108 05/02/2023 03:03 AM    CO2 25 05/02/2023 03:03 AM    GAP 10 05/02/2023 03:03 AM    BUN 35 (H) 05/02/2023 03:03 AM    BUN 40 (H) 05/01/2023 04:18 AM    BUN 40 (H) 04/30/2023 05:11 AM    Lab Results   Component Value Date/Time    CR 2.41 (H) 05/02/2023 03:03 AM    CR 2.39 (H) 05/01/2023 04:18 AM    CR 2.24 (H) 04/30/2023 05:11 AM    GLU 90 05/02/2023 03:03 AM    CA 8.5 05/02/2023 03:03 AM    PO4 4.0 04/27/2023 06:29 PM    ALBUMIN 3.2 (L) 05/02/2023 03:03 AM        Lipid Profile INR   Lab Results   Component Value Date    CHOL 107 09/16/2022    TRIG 72 09/16/2022    HDL 50 09/16/2022    LDL 45 09/16/2022    VLDL 14 09/16/2022    NONHDLCHOL 57 09/16/2022         Lab Results   Component Value Date    INR 3.1 (H) 04/27/2023          04/27/23 Chest X-Ray:   Similar cardiac silhouette enlargement with persistent perihilar and basal opacities and vascular indistinctness, favored to represent edema.      Tele:  SR-ST, PVCs, HR 80s-100s    04/27/23 ECG:   SR w/ 1st degree A/V block, occasional PVCs, HR 85 bpm, QTc 504

## 2023-05-02 NOTE — Progress Notes
HC5 END OF SHIFT/PLAN OF CARE NURSING NOTE   Nursing Shift: Day Shift 0700-1900    Acute events, nursing interventions, & communication with providers:     Pt still having persistent productive cough. Robitussin, tessalon perles, cough drops given.Bumex changed to 3 mg this morning, and breathing Tx added    Patient Goal(s)  Patient will Progress toward pain management goal by the end of next shift.        Patient will  Verbalize readiness for discharge by discharge.   Admission Weight: Weight: 76.2 kg (168 lb)    Last 3 Weights:   Vitals:    04/30/23 0600 04/30/23 1311 05/01/23 0535   Weight: 76.3 kg (168 lb 3.2 oz) 76.2 kg (168 lb) 76.2 kg (168 lb)     Weight Change: Weight trend stable    Intake/Output Summary (Last 24 hours) at 05/01/2023 1803  Last data filed at 05/01/2023 1803  Gross per 24 hour   Intake 1765.1 ml   Output 850 ml   Net 915.1 ml     Last Bowel Movement Date: 04/30/23    Fluid Restriction? No   Quality/Safety    Total Fall Risk Score: 10   Risk for Injury related to falls: Standard risk for injury based on the ABCS scoring tool  Fall Risk Category:   History of More Than One Fall Within 6 Months Before Admission: No  Elimination, Bowel and Urine: N/A  Interventions: N/A - Does not score for risk in this category  Medications: On 2 or more high fall risk drugs  Interventions: Educate patient on medication side effects  Patient Care Equipment: One present  Interventions: Needs assistance with patient care equipment when ambulating  Mobility: 2 - Assistance required  Interventions: Assist x1  Cognition: 0 - No cognition issues  Interventions: N/A - Does not score as risk in this category    Other safety precautions in place: CLABSI Prevention Bundle    Restraints:  No      Patient Education  This RN provided education to Patient today. The following education topics were reviewed:  Quality/Safety Education:   CLABSI prevention and Pain scale  Medication Education:   Pharmacological pain intervention  Education provided on the following medication(s): Tylenol, topical pain cream   Cardiac - Specific Education:   Heart failure management  General Education:   Glucose management    The following teaching method(s) were used: Verbal  Response to learning: Verbalizes Understanding  Needs reinforcement on: Pt verbalizes understanding

## 2023-05-03 ENCOUNTER — Encounter: Admit: 2023-05-03 | Discharge: 2023-05-03 | Payer: MEDICARE

## 2023-05-03 LAB — POC GLUCOSE
~~LOC~~ BKR POC GLUCOSE: 119 mg/dL — ABNORMAL HIGH (ref 70–100)
~~LOC~~ BKR POC GLUCOSE: 101 mg/dL — ABNORMAL HIGH (ref 70–100)
~~LOC~~ BKR POC GLUCOSE: 143 mg/dL — ABNORMAL HIGH (ref 70–100)
~~LOC~~ BKR POC GLUCOSE: 182 mg/dL — ABNORMAL HIGH (ref 70–100)

## 2023-05-03 LAB — MAGNESIUM: ~~LOC~~ BKR MAGNESIUM: 1.8 mg/dL (ref 1.6–2.6)

## 2023-05-03 MED ORDER — POTASSIUM CHLORIDE 20 MEQ PO TBTQ
40 meq | ORAL | 0 refills | Status: CP
Start: 2023-05-03 — End: ?
  Administered 2023-05-03 (×2): 40 meq via ORAL

## 2023-05-03 MED ORDER — TORSEMIDE 20 MG PO TAB
40 mg | Freq: Two times a day (BID) | ORAL | 0 refills | Status: DC
Start: 2023-05-03 — End: 2023-05-04
  Administered 2023-05-03: 22:00:00 40 mg via ORAL

## 2023-05-03 MED ORDER — MAGNESIUM SULFATE IN D5W 1 GRAM/100 ML IV PGBK
1 g | INTRAVENOUS | 0 refills | Status: CP
Start: 2023-05-03 — End: ?
  Administered 2023-05-03: 22:00:00 1 g via INTRAVENOUS

## 2023-05-03 NOTE — Case Management (ED)
Case Management Progress Note    NAME:Raven [name]                          MRN: [id number]              DOB:[birth year]          AGE: 78 y.o.  ADMISSION DATE: 04/27/2023             DAYS ADMITTED: LOS: 6 days      Today's Date: 05/03/2023    PLAN: Anticipate discharge home with resumption of [facility], and [facility] for [employer] none.  Son provide transportation home at discharge.  Nurse case manager notified [facility] of anticipated discharge home tomorrow. Patient will need new Hyacinth Meeker known delivered to the bedside for discharge.  Nurse case manager send clinical updates to [facility].  Case management team continue to follow and assess additional case management needs.    Expected Discharge Date: 05/04/2023 3:00 PM  Is Patient Medically Stable: No, Please explain: IV->PO diuretic  Are there Barriers to Discharge? no    INTERVENTION/DISPOSITION:  Discharge Planning              Discharge Planning: Home Health, Home Infusion-[facility]  Nurse case manager notified [facility] of anticipated discharge home tomorrow. Patient will need a new bag of Miller numb for discharge.  Nurse case manager send updated clinical and signed home healthcare/home infusion orders to [facility].  Nurse case manager send updated clinical information to [facility].    Transportation              Does the Patient Need Case Management to Arrange Discharge Transport? (ex: facility, ambulance, wheelchair/stretcher, Medicaid, cab, other): No  Will the Patient Use Family Transport?: Yes  Transportation Name, Phone and Availability #1: [facility] Ruddy (Ph: [phone])  Support              Support: Huddle/team update  Info or Referral              Information or Referral to Community Resources: No Needs Identified  Positive SDOH Domains and Potential Barriers                   Medication Needs              Medication Needs: No Needs Identified Financial              Financial: No Needs Identified  Legal              Legal: No Needs Identified  Other              Other/None: No needs identified  Discharge Disposition                                                   Selected Continued Care - Admitted Since 04/27/2023       [facility]       Service Provider Services Address Phone Fax Patient Preferred    [name] [address] [place] [address] [phone] --              [facility]  Service Provider Services Address Phone Fax Patient Preferred    [facility] [address], [place] [address] [phone] --                      Adela Lank RN BSN  Integrated Nurse Case Manager  [phone]/ [phone]/ Voalte [phone]

## 2023-05-03 NOTE — Progress Notes
Heart Failure Progress Note    NAME:Raven [name]             MRN: [id number]                 DOB:[birth year]          AGE: 78 y.o.  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 She follows with Dr. Herma Carson. [name] and [name], APRN.     She presented to the ED on 04/27/23 reporting rectal bleeding, had recently had dark jellylike stool, abdominal discomfort, fatigue, nausea, had recently been diagnosed with RSV therefore was also having coughing symptoms.  Labs revealed stable H&H of 9.5/30.5, NT proBNP 12,625, high-sensitivity troponin levels 33-39-40.  Chest x-ray suggestive of pulmonary edema. Unfortunately [name] has had 4 hospitalizations over the last month as outlined below; this is her 8th admission over the last 9 months.     Admitted from 11/27-12/4/24 with melena and anemia, received PRBCs and underwent EGD on 12/2 which revealed 1.5 cm pedunculated viable polyp in duodenum which was clipped, Aspirin and Plavix were discontinued and she remained on Eliquis for anticoagulation.  Admitted from 12/6-12/13/24 with hematemesis, melena, symptomatic anemia.  Although her Plavix and aspirin had been discontinued during prior admission, pt reported she continued taking both aspirin and Plavix along with her Eliquis.  She required x4 units of PRBCs. Repeat EGD on 12/8 showed ulcer in duodenum and visible vessel with no active bleeding which was injected with epinephrine and Endo Clip placed.  Admitted from 12/15-12/19/24 for decompensated heart failure, required IV diuresis. It was noted that her milrinone infusion had been off for unknown amount of time.  For ongoing anemia she required x1 unit of PRBCs.  Admitted from 12/23-12/27/24 after she presented to HF clinic reporting symptoms of shortness of breath, concerns of hypervolemia and ~10 lbs weight gain.  She received IV diuresis, had developed a cough which was later identified as RSV when she was seen by her local ED.    Recommendations 05/03/23:  Chronic cardiogenic shock: continue home milrinone infusion at 0.3 mcg/kg/min  Diuretics: Bumex 3 mg IV x  1 today.  Transition to Torsemide 40 mg po bid this afternoon.  Of note, she dropped > 3 lbs with dose of 5 mg metolazone so this will be good to utilize in outpatient setting-may need to schedule once weekly if weight up at 1 week f/u.  168 lbs>164.8 lbs today 1/6.   Agree with K-dur 40 mEq bid today.  20 mEq po bid on discharge.    Spec in lab Mg today.  Pt is having some cramping.  Daily Mg added next few days. *could consider low dose muscle relaxant if not improved with electrolyte replacement and diuretic adjustments to PO regimen.   HFrEF GDMT: limited with CKD and hx cardiogenic shock. Toprol XL stopped during hospitalization 12/23-12/27. Will not re-add at this time. Previously on [name] which was recently dc'd, defer resumption at this time with GFR 20  Paroxsymal AFib: Continue PTA Amiodarone 200 mg daily, PTA Eliquis remains on hold with concerns for GIB. Messaged Watchman navigator 04/29/23 to schedule appointment to discuss Watchman outpatient.   CAD: continue PTA Rosuvastatin 10 mg daily  Hx GIB: H/H remains stable; monitor closely for signs of bleeding. GI following. Colonoscopy 1/3 revealed no acute findings.  She will undergo EUS 1/7 to biopsy pancreatic lesion.    HF team will see again tomorrow 05/04/23.    Ongoing:  BMP once a day. Mg level daily. Maintain K+ over 4.0 and Mg+ over 2.0   2000mg  sodium dietary restriction.  Fluid Restriction:1.5  Strict I/O. Goal output: net neg 2L/24 hour  Daily standing scale weight. Goal Dry Weight: unknown.  Previously thought to be 165 pounds   Please place a specimen in lab NT-proBNP order on day of discharge in effort to establish baseline value correlating with suspected euvolemia.    Primary team responsible for placing Post-[facility]charge [facility] Appointment Request order set for Cardiology: Heart Failure appointment request within 24-48 hours of discharge. (Please do not place order any earlier in effort to reduce possible need for cancellation and rescheduling of visit).    Pt seen by me and plan as above.      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   She presents with signs of hypervolemia with Left  ventricular failure with signs of low flow state.    BNP Labs:   Lab Results   Component Value Date    NTPROBNP 12,625 (H) 04/27/2023    NTPROBNP 11,636 (H) 04/19/2023    NTPROBNP 11,324 (H) 04/12/2023      High Sensitivity Troponin:  Lab Results   Component Value Date    HSTROP0HR 33 (H) 04/27/2023    HSTROP2HR 39 (H) 04/27/2023    HSTROP4HR 40 (H) 04/27/2023    HSTROPDELT4 1 04/27/2023    HSTROPDELTA -2 04/19/2023    HIGHSTROPI 28 (H) 04/12/2023     Admission Weight: 76.2 kg (168 lb)        Most recent weights (inpatient):   Vitals:    04/30/23 1311 05/01/23 0535 05/02/23 0310   Weight:  76.2 kg (168 lb) 76.2 kg (168 lb) 76.3 kg (168 lb 3.2 oz)        I/O: Net - 880 mL : 24 hours              Net + 1.2 L :Stay /last 2 weeks                Urine 24 hours: 1.6 L      PLAN:  Diuretic Therapy    Prior to admission dose Torsemide 20 mg BID + Kdur 20 mEq BID      Given on admission 12/31: IV Lasix 60 mg x 1      Daily Dosing 1/1-1/2: Bumex 2 mg IV BID  1/3: bumex 2mg  IV BID  1/4-1/5: increase to bumex 3mg  IV BID  1/6: Bumex 3 IV a.m. and torsemide 40 mg po bid started p.m.     *Of note, she dropped > 3 lbs with dose of 5 mg metolazone so this will be good to utilize in outpatient setting-may need to schedule once weekly if weight up at 1 week f/u.  168 lbs>164.8 lbs today 1/6.     Guideline Directed Medical Therapy PTA Inpatient Changes   ACEI/ARB N/A-CKD IV    ARNI N/A-CKD IV    BB N/A-cardiogenic shock on home inotrope; previously tolerated Toprol XL which was discharged during most recent hospitalization.  Will not resume at this time   SGLT-2 Inhibitor N/A- recently on [name] 25 mg/day which was dc'd during 12/15-12/19 admission Defer resumption with CrCl <20 mL/min   Mineralocorticoid Receptor Antagonist N/A-CKD IV    Hydralazine/Nitrate N/A, BP well controlled    Ivabradine N/A, cardiogenic shock    Heart Rhythm Management Therapy Yes (ICD) 04/12/23 interrogation stable, no recent events   Anticoagulation for Afib/flutter apixaban (Eliquis) 5 mg BID Held on admission with concern for GIB   Cardiac Rehab Evaluation for LVEF < or equal to 40% Yes; Date ordered: 04/12/23    7-Day post hospital follow up scheduled within 24-48 hours of discharge No Primary team to schedule a follow up using [facility]charge [facility] Appointment Request Order Set for Cardiology: Heart Failure appt request within 24-48 hours of discharge      Inotrope Therapy Dose & Adjustments  (document symptom and hemodynamic response if pertinent)   Milrinone 0.3 mcg/kg/min Started during hospitalization May 2024     Testing/Procedures   04/12/23 Echo:  Eccentric hypertrophy of the left ventricle with severe dilatation of the left ventricular chamber  Left ventricular systolic function is moderately to severely impaired with an estimate ejection fraction of 30% and global hypokinesis  Right ventricle is moderately dilated with normal function  The left atrium is severely dilated  Pacemaker lead seen in the right atrium and right ventricle  Severe mitral valve insufficiency  Severe tricuspid valve insufficiency  No pericardial effusion  Severe pulmonary hypertension with estimated peak systolic pulmonary artery pressure of 73 mmHg    RHC 09/18/22 (weight 165 pounds)  RA 13  RV 70/12  PA 72/27 with mean 47  PCWP 17  TPG 30  PVR 12  CO/CI 2.5/1.4 by thermo  CO/CI 1.9/1.1 by Fick      Heart Failure Remote Monitoring and/or Heart Failure Research Candidacy   Patient is not an appropriate candidate for any remote monitoring or heart failure research studies due to  end stage HF .    CAD s/p CABG x3 (2010)  - 12/31 admit troponin 33>39>40, EKG without acute ischemic  changes   > cont statin therapy; aspirin and Plavix recently dc'd with GIB    S/p ICD  - device evaluation 04/12/23 AP <1%/VP <1%  - ICD interrogation 04/29/23: AP <1%/VP <1% with presenting rhythm AS-VS 90s with elevated HF diagnostics    HLD  Lab Results   Component Value Date    CHOL 107 09/16/2022    TRIG 72 09/16/2022    HDL 50 09/16/2022    LDL 45 09/16/2022    VLDL 14 09/16/2022    NONHDLCHOL 57 09/16/2022   > cont PTA Rosuvastatin 10 mg/day    Paroxysmal AFib  - PTA anticoagulated with Eliquis 5 mg BID held on admission  - cont PTA Amiodarone 200 mg/day  - recommend evaluation for Watchman in outpatient setting     CKD stage IV  - 12/31 admit creat 2.36  - baseline creat ranges 2.4-2.6  Lab Results   Component Value Date    CR 2.41 (H) 05/03/2023    CR 2.41 (H) 05/02/2023    CR 2.39 (H) 05/01/2023   > avoid nephrotoxins, daily labs to follow    Chronic Anemia  Frequent GIB  - 12/31 admit H/H 9.5/30.5  Lab Results   Component Value Date    IRON 25 (L) 04/28/2023    PSAT 17 (L) 04/28/2023    TIBC 148 (L) 04/28/2023    FERRITIN 950 (H) 04/28/2023    > s/p IV Venofer 300 mg x1 on 03/31/23  - 04/30/23: Colonoscopy: no active bleeding or blood->suspect prior bleeding may have been hemorrhoidal vs diverticular based on findings    Hx DM 2  - 04/20/23 HgbA1c 6.3  - no PTA meds; previously on [name] 25 mg daily, this was discontinued during recent admission   > managed per primary team     RSV  - supportive care per primary team    _____________________________________________________________________________  Subjective:   She currently complains of fatigue, dyspnea on exertion, productive cough, and peripheral edema, cramping. She is feeling well aside from cough.  We weight in room today and she is down 3.2 lbs with 5 mg metolazone.    She currently denies paroxysmal nocturnal dyspnea, orthopnea, lightheadedness, positional lightheadedness, palpitations, and chest pain.        Objective:                       Vital Signs:  Last Filed                Vital Signs: 24 Hour Range   BP: 124/71 (01/06 0900)  Temp: 36.7 ?C (98.1 ?F) (01/06 0900)  Pulse: 99 (01/06 0900)  Respirations: 20 PER MINUTE (01/06 0900)  SpO2: 96 % (01/06 0900)  O2 Device: None (Room air) (01/06 0900)  BP: (102-128)/(49-71)   Temp:  [36.7 ?C (98.1 ?F)-37 ?C (98.6 ?F)]   Pulse:  [93-105]   Respirations:  [16 PER MINUTE-20 PER MINUTE]   SpO2:  [94 %-99 %]   O2 Device: None (Room air)    Intensity Pain Scale (Self Report): 7 (05/02/23 2000)      Wt Readings from Last 10 Encounters:   05/02/23 76.3 kg (168 lb 3.2 oz)   04/23/23 76 kg (167 lb 9.6 oz)   04/15/23 77.6 kg (171 lb)   04/06/23 75.9 kg (167 lb 5.3 oz)   03/26/23 74.2 kg (163 lb 9.3 oz)   03/19/23 77 kg (169 lb 12.8 oz)   02/16/23 75.3 kg (166 lb)  02/15/23 75.3 kg (166 lb)   01/04/23 75.2 kg (165 lb 12.8 oz)   12/14/22 74.6 kg (164 lb 6.4 oz)     Physical Exam:    General Appearance: no distress  Neck Veins: JVP 6 cm, HJR positive sitting upright in chair, (she does have severe TR)  Respiratory: coarse with expiratory wheezes thoughtout posteriorly, coarseness clears somewhat with cough  Cardiac Auscultation: Regular rhythm, S1, S2, no S3 or S4, grade 2/6 murmur  Lower Extremity Edema: trace LE (puffy)  Abdominal Exam: soft, less distended, non-tender, bowel sounds normal  Orientation: clear historian, good insight    Laboratory Review:   CBC w/Diff    Lab Results   Component Value Date/Time    WBC 8.50 05/03/2023 04:19 AM    RBC 2.96 (L) 05/03/2023 04:19 AM    HGB 8.6 (L) 05/03/2023 04:19 AM    HCT 26.3 (L) 05/03/2023 04:19 AM    MCV 88.6 05/03/2023 04:19 AM    MCH 28.9 05/03/2023 04:19 AM    MCHC 32.6 05/03/2023 04:19 AM    RDW 24.1 (H) 05/03/2023 04:19 AM    PLTCT 283 05/03/2023 04:19 AM    MPV 7.9 05/03/2023 04:19 AM    Lab Results   Component Value Date/Time    NEUT 68 05/03/2023 04:19 AM    ANC 5.80 05/03/2023 04:19 AM    LYMA 17 (L) 05/03/2023 04:19 AM    ALC 1.40 05/03/2023 04:19 AM    MONA 10 05/03/2023 04:19 AM    AMC 0.90 (H) 05/03/2023 04:19 AM    EOSA 4 05/03/2023 04:19 AM    AEC 0.40 05/03/2023 04:19 AM    BASA 1 05/03/2023 04:19 AM    ABC 0.10 05/03/2023 04:19 AM         Chemistry    Lab Results   Component Value Date/Time    NA 145 05/03/2023 04:19 AM    K 3.4 (L) 05/03/2023 04:19 AM    CL 108 05/03/2023 04:19 AM    CO2 26 05/03/2023 04:19 AM    GAP 11 05/03/2023 04:19 AM    BUN 36 (H) 05/03/2023 04:19 AM    CR 2.41 (H) 05/03/2023 04:19 AM    GLU 139 (H) 05/03/2023 04:19 AM    MG 2.0 05/02/2023 03:03 AM    Lab Results   Component Value Date/Time    CA 8.5 05/03/2023 04:19 AM    PO4 4.0 04/27/2023 06:29 PM    ALBUMIN 3.0 (L) 05/03/2023 04:19 AM    TOTPROT 4.9 (L) 05/03/2023 04:19 AM    ALKPHOS 89 05/03/2023 04:19 AM    AST 15 05/03/2023 04:19 AM    ALT 11 05/03/2023 04:19 AM    TOTBILI 0.7 05/03/2023 04:19 AM    GFR 20 (L) 05/03/2023 04:19 AM            Renal Function    Lab Results   Component Value Date/Time    NA 145 05/03/2023 04:19 AM    K 3.4 (L) 05/03/2023 04:19 AM    CL 108 05/03/2023 04:19 AM    CO2 26 05/03/2023 04:19 AM    GAP 11 05/03/2023 04:19 AM    BUN 36 (H) 05/03/2023 04:19 AM    BUN 35 (H) 05/02/2023 03:03 AM    BUN 40 (H) 05/01/2023 04:18 AM    Lab Results   Component Value Date/Time    CR 2.41 (H) 05/03/2023 04:19 AM    CR 2.41 (H) 05/02/2023 03:03 AM  CR 2.39 (H) 05/01/2023 04:18 AM    GLU 139 (H) 05/03/2023 04:19 AM    CA 8.5 05/03/2023 04:19 AM    PO4 4.0 04/27/2023 06:29 PM    ALBUMIN 3.0 (L) 05/03/2023 04:19 AM        Lipid Profile INR   Lab Results   Component Value Date    CHOL 107 09/16/2022    TRIG 72 09/16/2022    HDL 50 09/16/2022    LDL 45 09/16/2022    VLDL 14 09/16/2022    NONHDLCHOL 57 09/16/2022         Lab Results   Component Value Date    INR 3.1 (H) 04/27/2023          04/27/23 Chest X-Ray:   Similar cardiac silhouette enlargement with persistent perihilar and basal opacities and vascular indistinctness, favored to represent edema.      Tele:  SR-ST, PVCs, HR 80s-100s    04/27/23 ECG:   SR w/ 1st degree A/V block, occasional PVCs, HR 85 bpm, QTc 504

## 2023-05-03 NOTE — Progress Notes
OCCUPATIONAL THERAPY      Per discussion today with Physical Therapy, discharge recommendation will be changed to home/prior living situation.       [place], [place] R/L [id number]

## 2023-05-03 NOTE — Progress Notes
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    Benign essential hypertension    Cardiomyopathy, ischemic    Paroxysmal A-fib (HCC)    ICD (implantable cardioverter-defibrillator) in place    Persistent atrial fibrillation (HCC)    Acute on chronic HFrEF (heart failure with reduced ejection fraction) (HCC)      Assessment and Plan   Principal Problem:    GI bleed  Active Problems:    Multiple myeloma (HCC)    Receiving inotropic medication    Coronary artery disease involving native coronary artery of native heart without angina pectoris    Type 2 diabetes mellitus without complications (HCC)    Chronic kidney disease, stage 4 (severe) (HCC)    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     Of note, this is Ms. Moor's eighth hospitalization at [facility] this year. Recurrent hospitalizations for HF exacerbation, GI bleeds.      Hematochezia  -Patient endorses 1 day history of bright red blood per rectum  -Hemoglobin on admission 9.5 which appears to be around baseline  -12/2 EGD: 1.5 cm friable polyp in duodenum s/p clip x2, no bleeding seen   -12/8 EGD with ulcer in duodenum and visible vessel, no active bleeding. Endo Clip placed.  -Patient currently hemodynamically stable  -Hematochezia possibly secondary to diverticular bleed versus angiodysplasia versus hemorrhoidal bleed  Plan  -Consult GI. Colonoscopy done on April 30, 2023.  No blood or dark activity on antigen.  Hemorrhoidal versus diverticular.  Retained endoscopically in ascending colon.  Patent end-to-end close anastomosis clear about visible suture and healthy-appearing was appear diverticulosis in sigmoid colon.  Nonbleeding external/internal hemorrhoids.  Otherwise normal.  -H&H stable.  Target Hb is above 7.  -Obtain type and cross  -Hold Eliquis.  -GI team is on board.  Appreciate their help.  They are planning to do EUS with biopsy tomorrow.  N.p.o. after midnight.  -     Cough  RSV infection  -Patient endorses nonproductive cough and shortness of breath  -Tested  positive for RSV in the ED  -Chest x-ray revealed similar cardiac silhouette enlargement with persistent perihilar and basilar opacities and vascular destructiveness favored to represent edema  Plan  -Continue with supportive therapy for now. Patient on room air   -Cough suppressants added     Acute on chronic combined systolic and diastolic heart failure  On chronic milrinone  Ischemic cardiomyopathy  CAD s/p CABG and PCI  CRT-D in situ  PAF  -Recent hospitalization 12/23-27 with HF exacerbation.   -Echo from 04/12/2023 revealed EF of 30% with grade 3 diastolic dysfunction and severe mitral and tricuspid valve regurgitation  -EKG revealed sinus rhythm with first-degree AV block and PVCs  -NT proBNP elevated at 12K. Has been >10k at baseline. Most recently 11,636 04/19/23  -Chest x-ray revealed pulmonary edema  -Suspect shortness of breath likely has a component of heart failure exacerbation  -PTA on torsemide 20 mg BID  Plan  -HF following.  Bumex 2 mg IV 1 dose.  Transition to torsemide 40 mg p.o. twice daily this afternoon.  She dropped more than 3 pound with 1 dose of 5 mg of trazodone.  They recommended continuing once weekly if weight up at 1 week follow-up.  -20 mg potassium twice daily on discharge.  Appreciate heart failure team.  -Daily weight and I&O  -Resume PTA milrinone  -Resume PTA amiodarone for PAF  -Holding Eliquis due to suspected GI bleed. Discussed with cardiology, may need to consider stopping eliquis with recurrent GI bleeds.  Cardiology is planning to schedule Watchman procedure as an outpatient.  -Interrogate device.  -     CKD stage IV  -Baseline creatinine appears to be anywhere between 2.3-2.6  -On admission creatinine 2.36  Plan  -Monitor renal function closely while diuresing.   -Daily CMP     Multiple myeloma  -Follows at [facility] and currently on Daratumumab  -Continue PTA acyclovir for prophylaxis  -Follow-up with hematology as outpatient     DM type II  -Most recent hemoglobin A1c from 04/20/2023 was 6.3  -Will start low-dose correction factor     COPD  Pulmonary nodules  -Continue PTA albuterol and Symbicort  -Follow-up for pulmonary nodules recommended after 6 to 8 weeks     Pancreatic cyst  -CT abdomen/pelvis from 04/02/2023 revealed mild increase in size of indeterminant large lobulated cystic mass in the tail of the pancreas  -Endoscopic ultrasound recommended  -Follow-up with GI as outpatient                    FEN: no IVF, electrolytes reviewed. CLD   Prophylaxis:  Hold due to concern for GI bleed  Disposition: Admit to medicine.    Code status: Full code (Discussed on 04/27/2023)        Diet: ADVANCE DIET AS TOLERATED  DIET CARDIAC (LOW FAT/LOW NA) & DIABETIC  DIET NPO AT MIDNIGHT Sips With Medications  DVT PPx:  SCD   Code Status: Full Code  Dispo: Continue inpatient admission   PCP: Barbaraann Rondo  Payor: [employer] MEDICARE / Plan: [facility] MEDICARE REPLACEMENT - LIFE1 / Product Type: Medicare /   Future Appointments   Date Time Provider Department Center   09/16/2023  1:30 PM Vanetta Shawl I, MD [facility] CVM Exam   09/17/2023  2:30 PM CMPB3 PACEMAKER [facility] CVM Procedur   09/17/2023  3:00 PM Emert, Catalina Pizza, MD [facility] CVM Exam               Patient was  discussed at multidisciplinary rounds which included nurse, pharmacist, social work, and nurse case management who provided additional information that affected the patient's care.            Miguel Aschoff, MD  Clinical Assistant Professor   Department of Internal Medicine  Service: Med Private     Amie Critchley is the preferred method of communication.   Please use the Med Private First Call for all patient-related communications.   Personal Voaltes and pagers are not answered at all hours.    Total Time Today was 50 minutes in the following activities: Preparing to see the patient, Obtaining and/or reviewing separately obtained history, Performing a medically appropriate examination and/or evaluation, Counseling and educating the patient/family/caregiver, Ordering medications, tests, or procedures, Referring and communication with other health care professionals (when not separately reported), Documenting clinical information in the electronic or other health record, Independently interpreting results (not separately reported) and communicating results to the patient/family/caregiver, and Care coordination (not separately reported)      Subjective   Pt seen today.   No acute event overnight.   No new concern.   Oral intake adequate.       Pt  is hemodynamically stable. Afebrile in last 24 hours. at room air and maintaining  sat.     No fever chills nausea vomiting chest pain shortness of breath or abdominal pain.   She mentioned that her breathing is much better.  She lost almost 4 pounds.        Patient communicated understanding and support of plan. No additional complaints.       Objective:       Vital Signs:  BP 116/58 (BP Source: Arm, Right Upper)  - Pulse 97  - Temp 37.1 ?C (98.7 ?F)  - Ht 157.5 cm (5' 2)  - Wt 74.8 kg (164 lb 12.8 oz)  - SpO2 93%  - BMI 30.14 kg/m?     Physical Exam  HENT:      Head: Normocephalic.      Nose: Nose normal.      Mouth/Throat:      Pharynx: Oropharynx is clear.   Eyes:      Conjunctiva/sclera: Conjunctivae normal.   Cardiovascular:      Rate and Rhythm: Normal rate and regular rhythm.      Pulses: Normal pulses.      Heart sounds: Normal heart sounds.   Pulmonary:      Effort: Pulmonary effort is normal.      Breath sounds: Normal breath sounds.   Abdominal:      Palpations: Abdomen is soft.   Musculoskeletal:         General: Normal range of motion.      Cervical back: Normal range of motion.      Right lower leg: No edema.      Left lower leg: No edema.   Skin:     General: Skin is warm.   Neurological:      General: No focal deficit present.      Mental Status: She is alert.   Psychiatric:         Mood and Affect: Mood normal.         Behavior: Behavior normal.         Allergies:  Bortezomib, Allopurinol, and Sulfa (sulfonamide antibiotics)    Intake/Output Summary:  (Last 24 hours)    Intake/Output Summary (Last 24 hours) at 05/03/2023 1305  Last data filed at 05/03/2023 1122  Gross per 24  hour   Intake 420 ml   Output 1600 ml   Net -1180 ml           Labs:  Recent Labs     05/01/23  0418 05/02/23  0303 05/03/23  0419   HGB 8.8* 8.6* 8.6*   HCT 27.4* 27.4* 26.3*   WBC 7.20 7.20 8.50   PLTCT 264 288 283   NA 145 143 145   K 4.0 3.5 3.4*   CL 110 108 108   CO2 25 25 26    BUN 40* 35* 36*   CR 2.39* 2.41* 2.41*   GLU 138* 90 139*   CA 8.7 8.5 8.5   MG 1.9 2.0 1.8   ALBUMIN 3.1* 3.2* 3.0*   TOTPROT 4.9* 5.0* 4.9* TOTBILI 0.8 0.8 0.7   AST 19 16 15    ALT 13 13 11    ALKPHOS 96 96 89   FSBS (Manual): (!) 182 (05/03/23 1100)  Glucose: (!) 139 (05/03/23 0419)  POC Glucose (Download): (!) 182 (05/03/23 1050)  BNP: No results for input(s): BNP in the last 72 hours.  CARDIAC ENZYMES: No results for input(s): CKMB in the last 72 hours.      Medications:  No current outpatient medications on file.       Recent Imaging  No results found.          Miguel Aschoff, MD  Clinical Assistant Professor   Department of Internal Medicine  Service: Med Private     Amie Critchley is the preferred method of communication.   Please use the Med Private First Call for all patient-related communications.   Personal Voaltes and pagers are not answered at all hours.

## 2023-05-03 NOTE — Progress Notes
PHYSICAL THERAPY  ASSESSMENT      Name: Raven [name]   MRN: [id number]     DOB: 02/22/1946      Age: 78 y.o.  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 Presents to emergency department with complaints of hematochezia and cough admitted for further evaluation. Of note, this is Ms. Derner's 8th admission over the last 9 months.  Mental / Cognitive: Alert;Oriented;Cooperative;Follows commands  Pain: No complaint of pain  Comments: On entry, pt seated upright in bed. On exit, pt in bedside chair w/ alarm on and all needs within reach. RN notified.    Home Living Situation  Lives With: Family  Type of Home: House  Entry Stairs: 2  Comments: Pt reports her son and DIL provide assistance when navigating stairs.  In-Home Stairs: 1 flight;Stair glide  Bathroom Setup: Tub/shower unit  Patient Owned Equipment: Bathing: Soil scientist: Public house manager)  Comments: Pt reports her son and DIL provide assistance when navigating stairs, pt reports they can provide consistent assist as needed. Pt uses RW within home and electric scooter for community distances.    Prior Level of Function  Level Of Independence: Independent with ADL and household mobility with device;Assist needed for IADL  Required Assist For: All home functioning ADL;Meal preparation;Medication/finance management;Stairs;Transportation/driving  Comments: Pt reports independence w/ ADLs (dressing, showering, toileting). Pt reports his son and DIL assist w/ IADLs (cooking, cleaning, laundry).  History of Falls in Past 3 Months: No    ROM  ROM Position Assessed: Seated  R LE ROM: WFL  R LE ROM Method: Active  L LE ROM: WFL  L LE ROM Method: Active    Strength  Strength Position Assessed: Seated  Overall Strength: Generalized weakness  Strength Comments: proximal LE weakness, however, adequate for functional mobiliy    Posture/Neurological  Head Control: Independent    Bed Mobility/Transfer  Comments: Patient in chair upon start/conclusion of session  Transfer Type: Sit to/from Stand  Transfer: Assistance Level: To/From;Bed;Standby Assist  Transfer: Assistive Device: Nurse, adult  Transfers: Type Of Assistance: For Safety Considerations  End Of Activity Status: Up in Chair;Nursing Notified;Instructed Patient to Request Assist with Mobility;Instructed Patient to Use Call Light (alarm activated)  Comments: Patient dons socks w/ set up while sitting in chair    Balance  Sitting Balance: Dynamic Sitting Balance;No UE Support;Independent  Standing Balance: Dynamic Standing Balance;2 UE support;Standby Assist    Gait  Gait Distance: 150 feet  Gait: Assistance Level: Standby Assist  Gait: Assistive Device: Nurse, adult  Gait: Descriptors: Decreased foot clearance RLE;Decreased foot clearance LLE;Forward trunk flexion;Swing-Through Gait;No balance loss  Comments: Verbal cueing to bring RW closer to BOS as patient fatigues. Good carryover. Patient denies  SOA or dizziness with gait. She reports feeling back to her baseline mobility  Activity Limited By: Complaint of Fatigue  Activity/Exercise  Comments: Patient performs oral and hand hygiene at sink for 5 minutes w/ SBA from therapist    Education  Persons Educated: Patient  Patient Barriers To Learning: None Noted  Teaching Methods: Verbal Instruction  Patient Response: Verbalized Understanding  Topics: Plan/Goals of PT Interventions;Use of Assistive Device/Orthosis;Mobility Progression;Importance of Increasing Activity;Continue Ambulation on Own;Ambulate With Nursing;Recommend Continued Therapy;Therapy Schedule    Assessment/Progress  Impaired Mobility Due To: Decreased Activity Tolerance;Medical Status Limitation  Assessment/Progress: Should Improve w/ Continued PT  Comments: Patient reports SOA and BLE edema had been limiting her mobility, however, patient reports that now that swelling has improved, she feels that she is back to her baseline mobility. Patient is currently completing all mobility w/ SBA w/ use of RW. She has consistent assistance at home. Recommend dc home once medically stable. PT will continue to follow    AM-PAC 6 Clicks Basic Mobility Inpatient  Turning from your back to your side while in a flat bed without using bed rails: None  Moving from lying on your back to sitting on the side of a flat bed without using bedrails : None  Moving to and from a bed to a chair (including a wheelchair): A Little  Standing up from a chair using your arms (e.g. wheelchair, or bedside chair): A Little  To walk in hospital room: A Little  Climbing 3-5 steps with a railing: A Little  Basic Mobility Inpatient Raw Score: 20  Standardized (T-scale) Score: 43.99  AM-PAC Basic Mobility Functional Stage: 34-51 Limited Mobility Indoors    Goals  Goal Formulation: With Patient  Time For Goal Achievement: 4 days  Patient Will Go Supine To/From Sit: Independently  Patient Will Transfer Sit to Stand: Independently  Patient Will Ambulate: Greater than 200 Feet, w/ Dan Humphreys, w/ Stand By Assist  Patient Will Go Up / Down Stairs: 1-2 Stairs, w/ Minimal Assist    Plan  Treatment Interventions: Mobility training;Strengthening;Balance activities;Endurance training;Neuromuscular reeducation;Rehabilitation technician to assist with ongoing mobilization, ADLs and/or exercise per instructions of licensed therapy staff  Plan Frequency: 3-5 Days per Week  PT Plan for Next Visit: increased gait w/ RW, stairs (2), standing ADLs for endurance    PT Discharge Recommendations  Recommendation: Home with consistent supervision/assistance  Recommendation for Therapy Post Discharge: Home health  Patient Currently Requires Physical Assist With: In and out of house;Stairs;Bathing  Patient Currently Requires Equipment: Owns what is needed    Therapist  Criselda Peaches, PT, DPT [phone]  Date  05/03/2023

## 2023-05-03 NOTE — Progress Notes
Gastroenterology Consultation - Follow Up      HISTORY OF PRESENT ILLNESS  Raven [name] is a pleasant 78 y.o. 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 Patient reports that she had a maroon-colored stool over the last 24 hours. She has a history of melena back in 03/2023 for which GI was consulted and performed EGD x 2 - full reports below. On discharge, she was in her usual state of health until she developed upper respiratory symptoms and postnasal drip. This admission, she was found to be RSV positive. GI consulted and planned for EGD/EUS/Colonoscopy to evaluate hematochezia and known large lobulated pancreatic cystic mass. Due to scheduling, EGD/EUS cancelled and colonoscopy performed on 1/3. Planning EGD w/EUS for this week.     24 Hour Events/Subjective:   Patient reports one loose stool this AM that was dark green. Hgb 8.6 this AM. Tolerating PO intake. Denies abdominal pain.     REVIEW OF MEDICAL RECORDS  Past Medical History:    Anemia    Anxiety    Arthritis    Asthma    Asymptomatic stenosis of right carotid artery    Atrial fibrillation (HCC)    Back pain    Cancer of uterus (HCC)    Chronic kidney disease    COPD (chronic obstructive pulmonary disease) (HCC)    Coronary artery disease    Diabetes mellitus (HCC)    Dyslipidemia    Flank mass    Fluid retention    GI bleed    Heart disease    Heart failure (HCC)    Hypertension    Multiple myeloma (HCC)    OSA on CPAP    Other malignant neoplasm without specification of site    Vision decreased     Surgical History:   Procedure Laterality Date    CORONARY ARTERY BYPASS GRAFT  2010    CABG 3 V    ROBOT ASSISTED LOW ANTERIOR RESECTION N/A 08/10/2022    Performed by Benetta Spar, MD at [facility] OR    ESOPHAGOGASTRODUODENOSCOPY WITH BIOPSY - FLEXIBLE N/A 09/17/2022 Performed by Tempie Hoist, DO at [facility] ENDO    CATHETERIZATION RIGHT HEART N/A 09/18/2022    Performed by Cath, Physician at [facility] CATH LAB    REMOVAL AND REPLACEMENT IMPLANTABLE DEFIBRILLATOR GENERATOR - SINGLE LEAD SYSTEM Left 11/04/2022    Performed by Kathreen Cornfield, MD at [facility] EP LAB    INSERTION/ REPLACEMENT PERMANENT PACEMAKER WITH ATRIAL LEAD Left 11/04/2022    Performed by Kathreen Cornfield, MD at [facility] EP LAB    Injection Venography Extremity Left 11/04/2022    Performed by Kathreen Cornfield, MD at [facility] EP LAB    ESOPHAGOGASTRODUODENOSCOPY WITH BIOPSY - FLEXIBLE N/A 03/29/2023    Performed by Buckles, Vinnie Level, MD at [facility] ENDO    ESOPHAGOGASTRODUODENOSCOPY WITH SNARE REMOVAL TUMOR/ POLYP/ OTHER LESION - FLEXIBLE N/A 03/29/2023    Performed by Buckles, Vinnie Level, MD at [facility] ENDO    ESOPHAGOGASTRODUODENOSCOPY WITH CONTROL OF BLEEDING - FLEXIBLE N/A 04/04/2023    Performed by Buckles, Vinnie Level, MD at [facility] ENDO    COLONOSCOPY WITH BIOPSY - FLEXIBLE N/A 04/30/2023    Performed by Onnie Boer, MD at [facility] ENDO    BONE MARROW BIOPSY      CARDIAC DEFIBRILLATOR PLACEMENT      [employer]  CAROTID ENDARDECTOMY Right     COLONOSCOPY      HX CORONARY STENT PLACEMENT      HX HEART CATHETERIZATION      HX HYSTERECTOMY      TUNNELED VENOUS PORT PLACEMENT Right     Chest     Social History     Socioeconomic History    Marital status: Widowed   Tobacco Use    Smoking status: Former     Current packs/day: 0.00     Average packs/day: 1 pack/day for 20.0 years (20.0 ttl pk-yrs)     Types: Cigarettes     Start date: 61     Quit date: 2010     Years since quitting: 15.0     Passive exposure: Never    Smokeless tobacco: Never   Vaping Use    Vaping status: Never Used   Substance and Sexual Activity    Alcohol use: Not Currently    Drug use: Not Currently     Allergies   Allergen Reactions    Bortezomib RASH     Noted in Provider note 8/22 cancer treatment drug    Allopurinol HIVES    Sulfa (Sulfonamide Antibiotics) NAUSEA ONLY Current Facility-Administered Medications   Medication Dose Route Frequency Provider Last Rate Last Admin    acetaminophen (TYLENOL) tablet 650 mg  650 mg Oral Q6H PRN Norm Parcel, MD   650 mg at 05/02/23 2212    acyclovir (ZOVIRAX) tablet 200 mg  200 mg Oral BID Rae Mar, MD   200 mg at 05/03/23 1478    albuterol-ipratropium (DUONEB) nebulizer solution 3 mL  3 mL Inhalation Q12H & PRN Miguel Aschoff, MD   3 mL at 05/03/23 2956    amiodarone (CORDARONE) tablet 200 mg  200 mg Oral Barry Dienes, MD   200 mg at 05/03/23 0832    amitriptyline/gabapentin/baclofen(#) 2/6/2 % topical cream   Topical TID PRN Norm Parcel, MD   Given at 04/30/23 2219    benzonatate (TESSALON PERLES) capsule 100 mg  100 mg Oral TID PRN Joaquin Music, MD   100 mg at 05/02/23 1719    budesonide-formoterol HFA (SYMBICORT) 160-4.5 mcg/actuation inhalation 2 puff  2 puff Inhalation BID Norm Parcel, MD   2 puff at 05/03/23 0834    bumetanide (BUMEX) injection 3 mg  3 mg Intravenous BID(9-17) Burns, Kara M, APRN-NP   3 mg at 05/03/23 0903    CHOLEcalciferoL (vitamin D3) tablet 1,000 Units  1,000 Units Oral Barry Dienes, MD   1,000 Units at 05/03/23 2130    dextromethorphan-guaiFENesin (ROBITUSSIN-DM) oral syrup 10 mL  10 mL Oral Q4H PRN Rae Mar, MD   10 mL at 05/02/23 1733    dextrose 50% (D50) syringe 25-50 mL  12.5-25 g Intravenous PRN Norm Parcel, MD        eucalyptus-menthoL (HALLS) lozenge 1 lozenge  1 lozenge Oral Q2H PRN Rae Mar, MD   1 lozenge at 05/02/23 1717    ferrous sulfate (FEOSOL) tablet 325 mg  325 mg Oral Barry Dienes, MD   325 mg at 05/03/23 0832    insulin aspart (U-100) (NOVOLOG FLEXPEN U-100 INSULIN) injection PEN 0-6 Units  0-6 Units Subcutaneous ACHS (22) Norm Parcel, MD   1 Units at 04/29/23 1225    melatonin tablet 5 mg  5 mg Oral QHS PRN Norm Parcel, MD   5 mg at 04/29/23 2013    milrinone (PRIMACOR) 20 mg/D5W 100 mL infusion  0.3 mcg/kg/min (Dosing Weight) Intravenous Continuous Norm Parcel, MD 7 mL/hr at 05/03/23 0910 0.3 mcg/kg/min at 05/03/23 0910    pantoprazole DR (PROTONIX) tablet 40 mg  40 mg Oral BID Norm Parcel, MD   40 mg at 05/03/23 0832    polyethylene glycol 3350 (MIRALAX) packet 17 g  1 packet Oral QDAY PRN Norm Parcel, MD   17 g at 05/02/23 0849    potassium chloride SR (K-DUR) tablet 40 mEq  40 mEq Oral Q4HRuffin Frederick, Margaretmary Lombard, MD        rosuvastatin (CRESTOR) tablet 10 mg  10 mg Oral QHS Norm Parcel, MD   10 mg at 05/02/23 2212    sennosides-docusate sodium (SENOKOT-S) tablet 1 tablet  1 tablet Oral QDAY PRN Norm Parcel, MD   1 tablet at 04/29/23 1007    sertraline (ZOLOFT) tablet 50 mg  50 mg Oral Barry Dienes, MD   50 mg at 05/03/23 2423     I personally reviewed past medical history, family history, and social history.     REVIEW OF SYSTEMS  HEENT: No loss of vision or bleeding gums   Cardiovascular: No chest pain    Respiratory: No wheezing   Gastrointestinal: See HPI above.     PHYSICAL EXAMINATION  Vital Signs: BP 124/71 (BP Source: Arm, Right Upper)  - Pulse 99  - Temp 36.7 ?C (98.1 ?F)  - Ht 157.5 cm (5' 2)  - Wt 76.3 kg (168 lb 3.2 oz)  - SpO2 96%  - BMI 30.76 kg/m?   Body mass index is 30.76 kg/m?Marland Kitchen  Physical Exam  Constitutional:       General: She is awake.   Cardiovascular:      Rate and Rhythm: Normal rate.   Pulmonary:      Effort: Pulmonary effort is normal. No respiratory distress.   Abdominal:      General: Bowel sounds are normal. There is no distension.      Palpations: Abdomen is soft.      Tenderness: There is no abdominal tenderness.   Neurological:      Mental Status: She is alert.   Psychiatric:         Mood and Affect: Mood normal.         Speech: Speech normal.        RECENT LABS - Pertinent labs reviewed by me personally and abbreviated in HPI above.     RADIOLOGY - I have personally reviewed the following pertinent radiology images and/or reports:     CT AP WO Contrast (04/02/2023)  1. No acute abdominopelvic inflammatory mass or bowel obstruction.   2. Distal colonic anastomosis. Mild distal colonic diverticulosis.   3. Mild increase in size of indeterminate large lobulated cystic mass in the tail of pancreas. Endoscopic ultrasound recommended for further evaluation.   4. Partially visualized mild cardiomegaly. Mild interlobular septal thickening with trace right pleural effusion suggesting CHF/fluid overload. Tree-in-bud and multiple subcentimeter nodular opacities are indeterminate and may be from edema, atypical infection, and/or aspiration. Short follow-up nonemergent CT chest in 6-8 weeks recommended for reevaluation.     GI PROCEDURES - I have personally reviewed the following pertinent GI procedure images and/or reports:     Colonoscopy (04/30/2023)  - No blood or active bleeding on entire exam. Suspect prior bleeding may have been hemorrhoidal vs diverticular based on findings.   - Retained endoscopic clip in ascending colon.   - Patent end-to-end colo-colonic anastomosis, characterized by visible sutures and healthy appearing  mucosa.   - Diverticulosis in the sigmoid colon, in the descending colon and in the transverse colon.   - Non-bleeding external and internal hemorrhoids.   - The examination was otherwise normal.     EGD (04/04/2023)  - Z-line regular, 37 cm from the incisors.   - Esophagogastric landmarks identified.   - Normal stomach.   - At the D1-D2 junction there was the site of previous polypectomy with one endoclip on it and the other endoclip one fold distal to it. At the tip of the post polypectomy site was an ulcer with a visible vessel [facility]). There was no active bleeding. Treated with epinephrine injection at the base, thermal therapy using gold probe and mechanical therapy using one endoclip.      EGD (03/29/2023)  - Normal esophagus   - 3 cm type-I sliding hiatal hernia.   - Normal stomach.   - A few duodenal polyps.   - 1.5 cm pedunculated duodenal polyp. Resected and retrieved. Two clips (MR conditional) were placed. Clip manufacturer: [employer].     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    Recommendations:  - Will plan for EUS with biopsy tomorrow (case requested)  - NPO @ [facility] (ordered)  - Continue to hold Eliquis if able  - Monitor electrolytes and replace as needed  - Trend H/H, transfuse pRBCs per protocol if Hgb <7gm/dl    Total Time Today was 50 minutes in the following activities: Preparing to see the patient, Obtaining and/or reviewing separately obtained history, Performing a medically appropriate examination and/or evaluation, Counseling and educating the patient/family/caregiver, Ordering medications, tests, or procedures, Referring and communication with other health care professionals (when not separately reported), Documenting clinical information in the electronic or other health record, Independently interpreting results (not separately reported) and communicating results to the patient/family/caregiver, and Care coordination (not separately reported)     Patient discussed and plan agreed upon with Dr. Janene Madeira. Thank you for this consultation. It has been a pleasure to be a part of the care team. Please feel free to contact the GI consult team with any questions or concerns.    Epifanio Lesches, MSN, APRN, FNP-C  [facility]  Division of Gastroenterology  Available via [place]  M-[phone], otherwise contact GI fellow on call

## 2023-05-03 NOTE — Progress Notes
PHYSICAL THERAPY  MOBILITY NOTE      Name: Raven [name]   MRN: [id number]     DOB: October 21, 1945      Age: 78 y.o.  Admission Date: 04/27/2023     LOS: 6 days     Date of Service: 05/03/2023    As part of the ongoing rehabilitation plan of care, patient was mobilized with assistance from the rehabilitation technician.    Mobility  Patient Turn/Position: Chair  Progressive Mobility Level: Walk in hallway  Distance Walked (feet): 150 ft  Level of Assistance: Assist X1  Assistive Device: Walker  Activity Limited By: Lydia Guiles / Medical Devices    Rehab Technician: Gonzella Lex  Date: 05/03/2023

## 2023-05-04 ENCOUNTER — Encounter: Admit: 2023-05-04 | Discharge: 2023-05-04 | Payer: MEDICARE

## 2023-05-04 LAB — CBC AND DIFF
~~LOC~~ BKR ABSOLUTE BASO COUNT: 0.1 10*3/uL (ref 0.00–0.20)
~~LOC~~ BKR ABSOLUTE EOS COUNT: 0.4 10*3/uL (ref 0.00–0.45)
~~LOC~~ BKR ABSOLUTE LYMPH COUNT: 1.8 10*3/uL — ABNORMAL LOW (ref 1.00–4.80)
~~LOC~~ BKR ABSOLUTE MONO COUNT: 0.8 10*3/uL (ref 0.00–0.80)
~~LOC~~ BKR ABSOLUTE NEUTROPHIL: 5.6 10*3/uL (ref 1.80–7.00)
~~LOC~~ BKR BASOPHILS %: 1 % (ref 0–2)
~~LOC~~ BKR EOSINOPHILS %: 5 % (ref 0–5)
~~LOC~~ BKR MONOCYTES %: 9 % (ref 4–12)

## 2023-05-04 LAB — PROTIME INR (PT): ~~LOC~~ BKR PROTIME: 15 s — ABNORMAL HIGH (ref 10.2–12.9)

## 2023-05-04 LAB — POC GLUCOSE
~~LOC~~ BKR POC GLUCOSE: 149 mg/dL — ABNORMAL HIGH (ref 70–100)
~~LOC~~ BKR POC GLUCOSE: 113 mg/dL — ABNORMAL HIGH (ref 70–100)
~~LOC~~ BKR POC GLUCOSE: 123 mg/dL — ABNORMAL HIGH (ref 70–100)
~~LOC~~ BKR POC GLUCOSE: 301 mg/dL — ABNORMAL HIGH (ref 70–100)

## 2023-05-04 LAB — TYPE & CROSSMATCH

## 2023-05-04 MED ORDER — METOLAZONE 5 MG PO TAB
5 mg | Freq: Once | ORAL | 0 refills | Status: CP
Start: 2023-05-04 — End: ?
  Administered 2023-05-04: 20:00:00 5 mg via ORAL

## 2023-05-04 MED ORDER — MAGNESIUM OXIDE 400 MG (241.3 MG MAGNESIUM) PO TAB
400 mg | Freq: Every evening | ORAL | 0 refills | Status: DC
Start: 2023-05-04 — End: 2023-05-06
  Administered 2023-05-05 – 2023-05-06 (×2): 400 mg via ORAL

## 2023-05-04 MED ORDER — TORSEMIDE 20 MG PO TAB
40 mg | Freq: Two times a day (BID) | ORAL | 0 refills | Status: DC
Start: 2023-05-04 — End: 2023-05-06
  Administered 2023-05-04 – 2023-05-06 (×4): 40 mg via ORAL

## 2023-05-04 MED ORDER — POTASSIUM CHLORIDE 20 MEQ PO TBTQ
40 meq | Freq: Three times a day (TID) | ORAL | 0 refills | Status: DC
Start: 2023-05-04 — End: 2023-05-05
  Administered 2023-05-04 – 2023-05-05 (×3): 40 meq via ORAL

## 2023-05-04 MED ORDER — POTASSIUM CHLORIDE 20 MEQ PO TBTQ
40 meq | ORAL | 0 refills | Status: DC
Start: 2023-05-04 — End: 2023-05-04
  Administered 2023-05-04: 16:00:00 40 meq via ORAL

## 2023-05-04 NOTE — Progress Notes
Heart Failure Progress Note    NAME:Raven [name]             MRN: [id number]                 DOB:07-21-1945          AGE: 78 y.o.  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 She follows with Dr. Herma Carson. [name] and [name], APRN. Multiple myeloma.    She presented to the ED on 04/27/23 reporting rectal bleeding, had recently had dark jellylike stool, abdominal discomfort, fatigue, nausea, had recently been diagnosed with RSV therefore was also having coughing symptoms.  Labs revealed stable H&H of 9.5/30.5, NT proBNP 12,625, high-sensitivity troponin levels 33-39-40.  Chest x-ray suggestive of pulmonary edema.  Unfortunately Ms. Kinyon has had 4 hospitalizations over the last month as outlined below; this is her 8th admission over the last 9 months.     Admitted from 11/27-12/4/24 with melena and anemia, received PRBCs and underwent EGD on 12/2 which revealed 1.5 cm pedunculated viable polyp in duodenum which was clipped, Aspirin and Plavix were discontinued and she remained on Eliquis for anticoagulation.  Admitted from 12/6-12/13/24 with hematemesis, melena, symptomatic anemia.  Although her Plavix and aspirin had been discontinued during prior admission, pt reported she continued taking both aspirin and Plavix along with her Eliquis.  She required x4 units of PRBCs. Repeat EGD on 12/8 showed ulcer in duodenum and visible vessel with no active bleeding which was injected with epinephrine and Endo Clip placed.  Admitted from 12/15-12/19/24 for decompensated heart failure, required IV diuresis. It was noted that her milrinone infusion had been off for unknown amount of time.  For ongoing anemia she required x1 unit of PRBCs.  Admitted from 12/23-12/27/24 after she presented to HF clinic reporting symptoms of shortness of breath, concerns of hypervolemia and ~10 lbs weight gain.  She received IV diuresis, had developed a cough which was later identified as RSV when she was seen by her local ED.    Recommendations 05/04/23:  Chronic cardiogenic shock: continue home milrinone infusion at 0.3 mcg/kg/min  Diuretics: Continue Torsemide 40  mg po bid.  Added metolazone 5 mg x 1 today 1/7. *~3 lbs decrease with 5 mg metolazone 1/6.   Increased K-dur 40 mEq tid with addition of metolazone.    Mg oxide 400 mg nightly.  1.9 today.   HFrEF GDMT: limited with CKD and hx cardiogenic shock. Toprol XL stopped during hospitalization 12/23-12/27. Will not re-add at this time. Previously on [name] which was recently dc'd, defer resumption at this time with GFR 20  Paroxsymal AFib: Continue PTA Amiodarone 200 mg daily, PTA Eliquis remains on hold with concerns for GIB. Messaged Watchman navigator 04/29/23 to schedule appointment to discuss Watchman outpatient. [id number] but re-current admissions with GI bleed concerns requiring PRBCs and some interventions (none this stay); certainly risk benefit conversation.   CAD: continue PTA Rosuvastatin 10 mg daily  Hx GIB: H/H remains stable; monitor closely for signs of bleeding. GI following. Colonoscopy 1/3 revealed no acute findings.  She will undergo EUS 1/7 to biopsy pancreatic lesion.    HF team will continue to follow.     Ongoing:  BMP once a day. Mg level daily. Maintain K+ over 4.0 and Mg+ over 2.0   2000mg  sodium dietary restriction.  Fluid Restriction:1.5  Strict I/O. Goal output: net neg 2L/24 hour  Daily standing scale weight. Goal Dry Weight: unknown.  Previously thought to be 165 pounds   Please place a specimen in lab NT-proBNP order on day of discharge in effort to establish baseline value correlating with suspected euvolemia.    Primary team responsible for placing Post-Discharge Health System Appointment Request order set for Cardiology: Heart Failure appointment request within 24-48 hours of discharge. (Please do not place order any earlier in effort to reduce possible need for cancellation and rescheduling of visit).    Pt seen by me and plan as above.      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   She presents with signs of hypervolemia with Left  ventricular failure with signs of low flow state.    BNP Labs:   Lab Results   Component Value Date    NTPROBNP 12,625 (H) 04/27/2023    NTPROBNP 11,636 (H) 04/19/2023    NTPROBNP 11,324 (H) 04/12/2023      High Sensitivity Troponin:  Lab Results   Component Value Date    HSTROP0HR 33 (H) 04/27/2023    HSTROP2HR 39 (H) 04/27/2023    HSTROP4HR 40 (H) 04/27/2023    HSTROPDELT4 1 04/27/2023    HSTROPDELTA -2 04/19/2023    HIGHSTROPI 28 (H) 04/12/2023     Admission Weight: 76.2 kg (168 lb)        Most recent weights (inpatient):   Vitals:    05/01/23 0535 05/02/23 0310 05/03/23 1048   Weight: 76.2 kg (168 lb) 76.3 kg (168 lb 3.2 oz) 74.8 kg (164 lb 12.8 oz)        I/O: Net - 1 L : 24 hours              Net + 166  mL :Stay /last 2 weeks                Urine 24 hours: 1.7 L      PLAN:  Diuretic Therapy    Prior to admission dose Torsemide 20 mg BID + Kdur 20 mEq BID      Given on admission 12/31: IV Lasix 60 mg x 1      Daily Dosing 1/1-1/2: Bumex 2 mg IV BID  1/3: bumex 2mg  IV BID  1/4 increase to bumex 3mg  IV BID  1/5:Bumex 3 mg IV bid + 5 mg metolazone as well  1/6: Bumex 3 IV a.m. and torsemide 40 mg po bid started p.m.  1/7: Torsemide 40 mg po bid + 5 mg metolazone     *Of note, she dropped > 3 lbs with dose of 5 mg metolazone so this will be good to utilize in outpatient setting-may need to schedule once weekly if weight up at 1 week f/u.  168 lbs>164.8 lbs today 1/6.     Guideline Directed Medical Therapy PTA Inpatient Changes   ACEI/ARB N/A-CKD IV    ARNI N/A-CKD IV    BB N/A-cardiogenic shock on home inotrope; previously tolerated Toprol XL which was discharged during most recent hospitalization.  Will not resume at this time   SGLT-2 Inhibitor N/A- recently on [name] 25 mg/day which was dc'd during 12/15-12/19 admission Defer resumption with CrCl <20 mL/min   Mineralocorticoid Receptor Antagonist N/A-CKD IV    Hydralazine/Nitrate N/A, BP well controlled    Ivabradine N/A, cardiogenic shock    Heart Rhythm Management Therapy Yes (ICD) 04/12/23 interrogation stable, no recent events   Anticoagulation for Afib/flutter apixaban (Eliquis) 5 mg BID Held on admission with concern for GIB   Cardiac Rehab Evaluation for LVEF < or equal to 40% Yes; Date ordered: 04/12/23    7-Day post hospital follow up scheduled within 24-48 hours of discharge No Primary team to schedule a follow up using Post-Discharge Health System Appointment Request Order Set for Cardiology: Heart Failure appt request within 24-48 hours of discharge      Inotrope Therapy Dose & Adjustments  (document symptom and hemodynamic response if pertinent)   Milrinone 0.3 mcg/kg/min Started during hospitalization May 2024     Testing/Procedures   04/12/23 Echo:  Eccentric hypertrophy of the left ventricle with severe dilatation of the left ventricular chamber  Left ventricular systolic function is moderately to severely impaired with an estimate ejection fraction of 30% and global hypokinesis  Right ventricle is moderately dilated with normal function  The left atrium is severely dilated  Pacemaker lead seen in the right atrium and right ventricle  Severe mitral valve insufficiency  Severe tricuspid valve insufficiency  No pericardial effusion  Severe pulmonary hypertension with estimated peak systolic pulmonary artery pressure of 73 mmHg    RHC 09/18/22 (weight 165 pounds)  RA 13  RV 70/12  PA 72/27 with mean 47  PCWP 17  TPG 30  PVR 12  CO/CI 2.5/1.4 by thermo  CO/CI 1.9/1.1 by Fick      Heart Failure Remote Monitoring and/or Heart Failure Research Candidacy   Patient is not an appropriate candidate for any remote monitoring or heart failure research studies due to  end stage HF .    CAD s/p CABG x3 (2010)  - 12/31 admit troponin 33>39>40, EKG without acute ischemic changes   > cont statin therapy; aspirin and Plavix recently dc'd with GIB    S/p ICD  - device  evaluation 04/12/23 AP <1%/VP <1%  - ICD interrogation 04/29/23: AP <1%/VP <1% with presenting rhythm AS-VS 90s with elevated HF diagnostics    HLD  Lab Results   Component Value Date    CHOL 107 09/16/2022    TRIG 72 09/16/2022    HDL 50 09/16/2022    LDL 45 09/16/2022    VLDL 14 09/16/2022    NONHDLCHOL 57 09/16/2022   > cont PTA Rosuvastatin 10 mg/day    Paroxysmal AFib  - PTA anticoagulated with Eliquis 5 mg BID held on admission  - cont PTA Amiodarone 200 mg/day  - recommend evaluation for Watchman in outpatient setting     CKD stage IV  - 12/31 admit creat 2.36  - baseline creat ranges 2.4-2.6  Lab Results   Component Value Date    CR 2.43 (H) 05/04/2023    CR 2.41 (H) 05/03/2023    CR 2.41 (H) 05/02/2023   > avoid nephrotoxins, daily labs to follow    Chronic Anemia  Frequent GIB  - 12/31 admit H/H 9.5/30.5  Lab Results   Component Value Date    IRON 25 (L) 04/28/2023    PSAT 17 (L) 04/28/2023    TIBC 148 (L) 04/28/2023    FERRITIN 950 (H) 04/28/2023    > s/p IV Venofer 300 mg x1 on 03/31/23  - 04/30/23: Colonoscopy: no active bleeding or blood->suspect prior bleeding may have been hemorrhoidal vs diverticular based on findings    Hx DM 2  - 04/20/23 HgbA1c 6.3  - no PTA meds; previously on [name] 25 mg daily, this was discontinued during recent admission   > managed per primary team     RSV  - supportive care per primary team    Multiple myeloma  Follows at [facility], was seen during this admission by Dr. Mitchell Heir.    She had a repeat CBC done at [facility] On 11/21 that shows gradual downtrending, hemoglobin was 8.8, hematocrit 27.2.  -She follows with  [facility] oncology she was given Darzalex subcutaneous as well as Retacrit.       _____________________________________________________________________________  Subjective:   She currently complains of  productive cough and peripheral edema, cramping. She is feeling well aside from cough, although improving    She currently denies paroxysmal nocturnal dyspnea, orthopnea, lightheadedness, positional lightheadedness, palpitations, and chest pain.        Objective: Vital Signs:  Last Filed                Vital Signs: 24 Hour Range   BP: 115/60 (01/07 0751)  Temp: 36.4 ?C (97.6 ?F) (01/07 0160)  Pulse: 86 (01/07 0751)  Respirations: 16 PER MINUTE (01/07 0751)  SpO2: 94 % (01/07 0751)  O2%: 21 % (01/06 2125)  O2 Device: CPAP/BiPAP (01/07 0751)  BP: (111-124)/(58-71)   Temp:  [36.3 ?C (97.4 ?F)-37.1 ?C (98.7 ?F)]   Pulse:  [86-99]   Respirations:  [16 PER MINUTE-20 PER MINUTE]   SpO2:  [93 %-98 %]   O2%:  [21 %]   O2 Device: CPAP/BiPAP           Wt Readings from Last 10 Encounters:   05/03/23 74.8 kg (164 lb 12.8 oz)   04/23/23 76 kg (167 lb 9.6 oz)   04/15/23 77.6 kg (171 lb)   04/06/23 75.9 kg (167 lb 5.3 oz)   03/26/23 74.2 kg (163 lb 9.3 oz)   03/19/23 77 kg (169 lb 12.8 oz)   02/16/23 75.3 kg (166 lb)  02/15/23 75.3 kg (166 lb)   01/04/23 75.2 kg (165 lb 12.8 oz)   12/14/22 74.6 kg (164 lb 6.4 oz)     Physical Exam:    General Appearance: no distress  Neck Veins: JVP 10 cm at 90 degrees, HJR positive sitting upright in chair, (she does have severe TR)  Respiratory: coarse t/o, but slightly clearer than day prior  Cardiac Auscultation: Regular rhythm, S1, S2, no S3 or S4, grade 2/6 murmur  Lower Extremity Edema: trace-1 +bilateral LE (puffy)  Abdominal Exam: soft, less distended, non-tender, bowel sounds normal  Orientation: clear historian, good insight    Laboratory Review:   CBC w/Diff    Lab Results   Component Value Date/Time    WBC 8.70 05/04/2023 06:17 AM    RBC 2.84 (L) 05/04/2023 06:17 AM    HGB 8.2 (L) 05/04/2023 06:17 AM    HCT 25.1 (L) 05/04/2023 06:17 AM    MCV 88.5 05/04/2023 06:17 AM    MCH 28.9 05/04/2023 06:17 AM    MCHC 32.6 05/04/2023 06:17 AM    RDW 23.7 (H) 05/04/2023 06:17 AM    PLTCT 271 05/04/2023 06:17 AM    MPV 8.2 05/04/2023 06:17 AM    Lab Results   Component Value Date/Time    NEUT 64 05/04/2023 06:17 AM    ANC 5.60 05/04/2023 06:17 AM    LYMA 21 (L) 05/04/2023 06:17 AM    ALC 1.80 05/04/2023 06:17 AM    MONA 9 05/04/2023 06:17 AM    AMC 0.80 05/04/2023 06:17 AM    EOSA 5 05/04/2023 06:17 AM    AEC 0.40 05/04/2023 06:17 AM    BASA 1 05/04/2023 06:17 AM    ABC 0.10 05/04/2023 06:17 AM         Chemistry    Lab Results   Component Value Date/Time    NA 144 05/04/2023 06:17 AM    K 3.3 (L) 05/04/2023 06:17 AM    CL 106 05/04/2023 06:17 AM    CO2 28 05/04/2023 06:17 AM    GAP 10 05/04/2023 06:17 AM    BUN 36 (H) 05/04/2023 06:17 AM    CR 2.43 (H) 05/04/2023 06:17 AM    GLU 112 (H) 05/04/2023 06:17 AM    MG 1.9 05/04/2023 06:17 AM    Lab Results   Component Value Date/Time    CA 8.8 05/04/2023 06:17 AM    PO4 4.0 04/27/2023 06:29 PM    ALBUMIN 3.0 (L) 05/04/2023 06:17 AM    TOTPROT 4.8 (L) 05/04/2023 06:17 AM    ALKPHOS 79 05/04/2023 06:17 AM    AST 13 05/04/2023 06:17 AM    ALT 10 05/04/2023 06:17 AM    TOTBILI 0.8 05/04/2023 06:17 AM    GFR 20 (L) 05/04/2023 06:17 AM            Renal Function    Lab Results   Component Value Date/Time    NA 144 05/04/2023 06:17 AM    K 3.3 (L) 05/04/2023 06:17 AM    CL 106 05/04/2023 06:17 AM    CO2 28 05/04/2023 06:17 AM    GAP 10 05/04/2023 06:17 AM    BUN 36 (H) 05/04/2023 06:17 AM    BUN 36 (H) 05/03/2023 04:19 AM    BUN 35 (H) 05/02/2023 03:03 AM    Lab Results   Component Value Date/Time    CR 2.43 (H) 05/04/2023 06:17 AM    CR 2.41 (H) 05/03/2023 04:19 AM  CR 2.41 (H) 05/02/2023 03:03 AM    GLU 112 (H) 05/04/2023 06:17 AM    CA 8.8 05/04/2023 06:17 AM    PO4 4.0 04/27/2023 06:29 PM    ALBUMIN 3.0 (L) 05/04/2023 06:17 AM        Lipid Profile INR   Lab Results   Component Value Date    CHOL 107 09/16/2022    TRIG 72 09/16/2022    HDL 50 09/16/2022    LDL 45 09/16/2022    VLDL 14 09/16/2022    NONHDLCHOL 57 09/16/2022         Lab Results   Component Value Date    INR 1.4 (H) 05/04/2023          04/27/23 Chest X-Ray:   Similar cardiac silhouette enlargement with persistent perihilar and basal opacities and vascular indistinctness, favored to represent edema.      Tele:  SR-ST, PVCs, HR 80s-100s    04/27/23 ECG:   SR w/ 1st degree A/V block, occasional PVCs, HR 85 bpm, QTc 504

## 2023-05-04 NOTE — Progress Notes
Brief GI Note    HISTORY OF PRESENT ILLNESS  Raven [name] is a pleasant 78 y.o. 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 Patient reports that she had a maroon-colored stool over the last 24 hours. She has a history of melena back in 03/2023 for which GI was consulted and performed EGD x 2 - full reports below. On discharge, she was in her usual state of health until she developed upper respiratory symptoms and postnasal drip. This admission, she was found to be RSV positive. GI consulted and planned for EGD/EUS/Colonoscopy to evaluate hematochezia and known large lobulated pancreatic cystic mass. Due to scheduling, EGD/EUS cancelled and colonoscopy performed on 1/3. Planning EGD w/EUS for this week.     PLAN  - Will attempt upper EUS w/biopsies tomorrow (case requested)  - NPO @ MN (ordered)  - Monitor electrolytes and replace as needed  - Continue holding Eliquis if able  - Trend H/H, transfuse pRBCs per protocol if Hgb <7gm/dl    Patient discussed and plan agreed upon with Dr. Janene Madeira. Thank you for this consultation. It has been a pleasure to be a part of the care team. Please feel free to contact the GI consult team with any questions or concerns.     Epifanio Lesches, MSN, APRN, FNP-C  [facility]  Division of Gastroenterology  Available via [place]  [phone], otherwise contact GI fellow on call

## 2023-05-04 NOTE — Progress Notes
PHYSICAL THERAPY  MOBILITY NOTE      Name: Raven [name]   MRN: [id number]     DOB: 1945/09/10      Age: 78 y.o.  Admission Date: 04/27/2023     LOS: 7 days     Date of Service: 05/04/2023    As part of the ongoing rehabilitation plan of care, patient was mobilized and/or completed ADLs with assistance from the rehabilitation technician .    Mobility  Patient Turn/Position: Chair  Progressive Mobility Level: Walk in hallway  Distance Walked (feet): 150 ft  Level of Assistance: Assist X1  Assistive Device: Walker  Activity Limited By: Fatigue    Rehab Technician: Carilyn Goodpasture  Date: 05/04/2023

## 2023-05-04 NOTE — Progress Notes
OCCUPATIONAL THERAPY  PROGRESS / DISCHARGE NOTE      Name: Raven [name]   MRN: [id number]     DOB: 06-29-1945      Age: 78 y.o.  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 Presents to emergency department with complaints of hematochezia and cough admitted for further evaluation. Of note, this is [name]'s 8th admission over the last 9 months.  Mental / Cognitive: Alert;Oriented;Cooperative;Follows commands  Pain: No complaint of pain  Pain Interventions: Patient assisted into position of comfort  Comments: On entry, pt seated upright in bed. On exit, pt in bedside chair w/ alarm on and all needs within reach. RN notified.    Home Living Situation  Lives With: Family  Type of Home: House  Entry Stairs: 2  Comments: Pt reports her son and DIL provide assistance when navigating stairs.  In-Home Stairs: 1 flight;Stair glide  Bathroom Setup: Tub/shower unit  Patient Owned Equipment: Bathing: Soil scientist: Public house manager)  Comments: Pt reports her son and DIL provide assistance when navigating stairs, pt reports they can provide consistent assist as needed. Pt uses RW within home and electric scooter for community distances.    Prior Level of Function  Level Of Independence: Independent with ADL and household mobility with device;Assist needed for IADL  Required Assist For: All home functioning ADL;Meal preparation;Medication/finance management;Stairs;Transportation/driving  Comments: Pt reports independence w/ ADLs (dressing, showering, toileting). Pt reports his son and DIL assist w/ IADLs (cooking, cleaning, laundry).  History of Falls in Past 3 Months: No    ADL's  Where Assessed: In Bathroom;Standing at [facility] (seated upright in bed)  Grooming Assist: Stand By Assist  Grooming Deficits: Supervision/Safety;Wash/Dry Hands  LE Dressing Assist: Independent  LE Dressing Deficits: Don/Doff R Sock;Don/Doff L Sock;Thread RLE Into Underwear;Thread LLE Into Underwear (min A to thread BLE through brief)  Toileting Assist: Stand By Assist  Toileting Deficits: Supervision/Safety;Clothing Management Up;Clothing Management Down;Perineal Hygiene  Comment: Pt independently dons bilateral socks while seated in bed. Completes pericare in standing w/ SBA. Manages brief up/down over hips independently. Requires min A to thread BLE through brief. Stands at sink to wash hands w/ SBA.    ADL Mobility  Bed Mobility Comments: Pt seated upright in bed upon OT entry.  Transfer Type: Sit to stand  Transfer: Assistance Level: From;Bed;To/from;Toilet;Bedside chair;Standby assist (to/from hallway couch)  Transfer: Assistive Device: Roller walker  Transfer: Type of Assistance: For safety considerations  End of Activity Status: Up in chair;Instructed patient to request assist with mobility;Instructed patient to use call light;Nursing notified  Transfer Comments: Denies dizziness/lightheadedness throughout positional changes.  Sitting Balance: Static sitting balance;Independent  Standing Balance: Static standing balance;2 UE support;Independent;Dynamic standing balance;Standby assist  Gait Distance: 30 feet (+ ~138ft hallway + seated break +~173ft hallway)  Gait: Assistance  Level: Standby assist;Management of lines;Safety considerations  Gait: Assistive Device: Roller walker  Gait Comments: Pt ambulates total of ~220ft w/ SBA (~76ft room + ~150ft + seated break +~126ft). Steady on feet w/ no LOB. Ascends/descends 1 set of x3 stairs w/ min HHA on L side to simulate how pt ascends/descends stairs at home.  Stairs: Number Climbed: 3  Stairs: Assistance Level: Minimal assist  Stairs: Assistive Device: Hand hold assist    Activity Tolerance  Endurance: 3/5 Tolerates 25-30 Minutes Exercise w/Multiple Rests    Cognition  Social Interaction: Interacts in a Network engineer  Attention: Awake/Alert    ROM  R UE ROM: WFL   R UE ROM Method: Active  L UE ROM: WFL   L UE ROM Method: Active  Coordination: Adequate to Complete ADLs  Grasp: Bilateral Grasp Functional for Activity    Edema  RUE Edema: No Significant Edema  LUE Edema: No Significant Edema  R Hand Edema: No Significant Edema  L Hand Edema: No Significant Edema    Education  Persons Educated: Patient  Barriers To Learning: None Noted  Teaching Methods: Verbal Instruction  Patient Response: Verbalized Understanding  Topics: Role of OT, Goals for Therapy  Goal Formulation: With Patient    Assessment  Assessment: Decreased Endurance  Prognosis: Good;w/Cont OT s/p Acute Discharge  No Skilled OT: At Baseline Function  Comments: Patient with significant progress. Patient ambulates total of ~255ft with SBA, ascends/descends x3 stairs with min HHA, and completes STS transfers with SBA this date. Anticipate patient is primarily limited by medical status and is currently at functional mobility baseline. Patient reports no concerns regarding safety and independence with ADLs and functional mobility upon DC home with family. Anticipate patient will benefit from [facility] therapies upon DC. After discussion with patient, no acute OT goals identified. OT will sign-off at this time; please re-consult if change in functional status occurs.      AM-PAC 6 Clicks Daily Activity Inpatient  Putting on and taking off regular lower body clothes: A Little  Bathing (Including washing, rinsing, drying): None  Toileting, which includes using toilet, bedpan, or urinal: None  Putting on and taking off regular upper body clothing: None  Taking care of personal grooming such as brushing teeth: None  Eating meals: None  Daily Activity Raw Score: 23  Standardized (T-scale) Score: 51.12    Plan  OT Frequency: No Further Treatment    ADL Goals  Patient Will Perform Grooming: Standing at Sink;w/ Minimum Assist;Met (met 01/07)  Patient Will Perform LE Dressing: At [facility] of Bed;w/ Minimum Assist (met 01/07)    Functional Transfer Goals  Pt Will Perform All Functional Transfers: w/ Stand By Assist (met 01/07)    OT Discharge Recommendations  Recommendation: Home with intermittent supervision/assistance  Recommendation for Therapy Post Discharge: Home health  Patient Currently Requires Physical Assist With: All home functioning ADLs;Stairs  Patient Currently Requires Equipment: Owns what is needed    Therapist: Burnadette Pop, OTD, OTR/L  Date: 05/04/2023

## 2023-05-04 NOTE — Progress Notes
HC5 END OF SHIFT/PLAN OF CARE NURSING NOTE   Nursing Shift: Night Shift 1900-0700    Acute events, nursing interventions, & communication with providers:     Pt still having persistent productive cough. Robitussin, tessalon perles, cough drops given.    Patient Goal(s)  Patient will Progress toward pain management goal by the end of next shift.        Patient will  Verbalize readiness for discharge by discharge.   Admission Weight: Weight: 76.2 kg (168 lb)    Last 3 Weights:   Vitals:    05/01/23 0535 05/02/23 0310 05/03/23 1048   Weight: 76.2 kg (168 lb) 76.3 kg (168 lb 3.2 oz) 74.8 kg (164 lb 12.8 oz)     Weight Change: Weight trend stable    Intake/Output Summary (Last 24 hours) at 05/04/2023 0334  Last data filed at 05/04/2023 0200  Gross per 24 hour   Intake 660 ml   Output 1700 ml   Net -1040 ml     Last Bowel Movement Date: 05/03/23    Fluid Restriction? No   Quality/Safety    Total Fall Risk Score: 10   Risk for Injury related to falls: Standard risk for injury based on the ABCS scoring tool  Fall Risk Category:   History of More Than One Fall Within 6 Months Before Admission: No  Elimination, Bowel and Urine: N/A  Interventions: N/A - Does not score for risk in this category  Medications: On 2 or more high fall risk drugs  Interventions: Educate patient on medication side effects  Patient Care Equipment: One present  Interventions: Needs assistance with patient care equipment when ambulating  Mobility: 2 - Assistance required  Interventions: Assist x1  Cognition: 0 - No cognition issues  Interventions: N/A - Does not score as risk in this category    Other safety precautions in place: CLABSI Prevention Bundle    Restraints:  No      Patient Education  This RN provided education to Patient today. The following education topics were reviewed:  Quality/Safety Education:   CLABSI prevention and Pain scale  Medication Education:   Pharmacological pain intervention  Education provided on the following medication(s): Tylenol, topical pain cream   Cardiac - Specific Education:   Heart failure management  General Education:   Glucose management    The following teaching method(s) were used: Verbal  Response to learning: Verbalizes Understanding  Needs reinforcement on: Pt verbalizes understanding

## 2023-05-04 NOTE — Progress Notes
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    Benign essential hypertension    Cardiomyopathy, ischemic    Paroxysmal A-fib (HCC)    ICD (implantable cardioverter-defibrillator) in place    Persistent atrial fibrillation (HCC)    Acute on chronic HFrEF (heart failure with reduced ejection fraction) (HCC)      Assessment and Plan   Principal Problem:    GI bleed  Active Problems:    Multiple myeloma (HCC)    Receiving inotropic medication    Coronary artery disease involving native coronary artery of native heart without angina pectoris    Type 2 diabetes mellitus without complications (HCC)    Chronic kidney disease, stage 4 (severe) (HCC)    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     Of note, this is Raven [name]'s eighth hospitalization at [facility] this year. Recurrent hospitalizations for HF exacerbation, GI bleeds.      Hematochezia  -Patient endorses 1 day history of bright red blood per rectum  -Hemoglobin on admission 9.5 which appears to be around baseline  -12/2 EGD: 1.5 cm friable polyp in duodenum s/p clip x2, no bleeding seen   -12/8 EGD with ulcer in duodenum and visible vessel, no active bleeding. Endo Clip placed.  -Patient currently hemodynamically stable  -Hematochezia possibly secondary to diverticular bleed versus angiodysplasia versus hemorrhoidal bleed  Plan  -Consult GI. Colonoscopy done on April 30, 2023.  No blood or dark activity on antigen.  Hemorrhoidal versus diverticular.  Retained endoscopically in ascending colon.  Patent end-to-end close anastomosis clear about visible suture and healthy-appearing was appear diverticulosis in sigmoid colon.  Nonbleeding external/internal hemorrhoids.  Otherwise normal.  -H&H stable.  Target Hb is above 7.  -Obtain type and cross  -Hold Eliquis.  -GI team is on board.  Appreciate their help.  They were planning to do EUS with biopsy today.  However it is canceled by anesthesia because of RSV .  GI is planning to schedule her for tomorrow otherwise able to do as  outpatient.  -     Cough  RSV infection  -Patient endorses nonproductive cough and shortness of breath  -Tested positive for RSV in the ED  -Chest x-ray revealed similar cardiac silhouette enlargement with persistent perihilar and basilar opacities and vascular destructiveness favored to represent edema  Plan  -Continue with supportive therapy for now. Patient on room air   -Cough suppressants added     Acute on chronic combined systolic and diastolic heart failure  On chronic milrinone  Ischemic cardiomyopathy  CAD s/p CABG and PCI  CRT-D in situ  PAF  -Recent hospitalization 12/23-27 with HF exacerbation.   -Echo from 04/12/2023 revealed EF of 30% with grade 3 diastolic dysfunction and severe mitral and tricuspid valve regurgitation  -EKG revealed sinus rhythm with first-degree AV block and PVCs  -NT proBNP elevated at 12K. Has been >10k at baseline. Most recently 11,636 04/19/23  -Chest x-ray revealed pulmonary edema  -Suspect shortness of breath likely has a component of heart failure exacerbation  -PTA on torsemide 20 mg BID  Plan  -HF following.  Appreciate their help.  They recommended to continue torsemide 40 mg twice daily.  Will resume 5 mg 1 dose given today.  Increase potassium 40 mg 3 times daily with addition of midazolam.  - Magnesium oxide 400 mg nightly.  -20 mg potassium twice daily on discharge.  Appreciate heart failure team.  -Daily weight and I&O  -Resume PTA milrinone  -Resume PTA amiodarone for PAF  -Holding Eliquis due to suspected GI bleed. Discussed with cardiology, may need to consider stopping eliquis with recurrent GI bleeds.  Cardiology is planning to schedule Watchman procedure as an outpatient.  -Interrogate device.  -     CKD stage IV  -Baseline creatinine appears to be anywhere between 2.3-2.6  -On admission creatinine 2.36  Plan  -Monitor renal function closely while diuresing.   -Daily CMP     Multiple myeloma  -Follows at [facility] and currently on Daratumumab  -Continue PTA acyclovir for prophylaxis  -Follow-up with hematology as outpatient     DM type II  -Most recent hemoglobin A1c from 04/20/2023 was 6.3  -Will start low-dose correction factor     COPD  Pulmonary nodules  -Continue PTA albuterol and Symbicort  -Follow-up for pulmonary nodules recommended after 6 to 8 weeks     Pancreatic cyst  -CT abdomen/pelvis from 04/02/2023 revealed mild increase in size of indeterminant large lobulated cystic mass in the tail of the pancreas  -Endoscopic ultrasound recommended  -Follow-up with GI as outpatient                    FEN: no IVF, electrolytes reviewed. CLD   Prophylaxis:  Hold due to concern for GI bleed  Disposition: Admit to medicine.    Code status: Full code (Discussed on 04/27/2023)        Diet: ADVANCE DIET AS TOLERATED  DIET NPO AT MIDNIGHT Sips With Medications  DVT PPx:  SCD   Code Status: Full Code  Dispo: Continue inpatient admission   PCP: Barbaraann Rondo  Payor: [employer]HC MEDICARE / Plan: [facility] MEDICARE REPLACEMENT - LIFE1 / Product Type: Medicare /   Future Appointments   Date Time Provider Department Center   09/16/2023  1:30 PM Vanetta Shawl I, MD [facility] CVM Exam   09/17/2023  2:30 PM CMPB3 PACEMAKER [facility] CVM Procedur   09/17/2023  3:00 PM Emert, Catalina Pizza, MD [facility] CVM Exam  Patient was discussed at multidisciplinary rounds which included nurse, pharmacist, social work, and nurse case management who provided additional information that affected the patient's care.            Miguel Aschoff, MD  Clinical Assistant Professor   Department of Internal Medicine  Service: Med Private     Amie Critchley is the preferred method of communication.   Please use the Med Private First Call for all patient-related communications.   Personal Voaltes and pagers are not answered at all hours.    Total Time Today was 50 minutes in the following activities: Preparing to see the patient, Obtaining and/or reviewing separately obtained history, Performing a medically appropriate examination and/or evaluation, Counseling and educating the patient/family/caregiver, Ordering medications, tests, or procedures, Referring and communication with other health care professionals (when not separately reported), Documenting clinical information in the electronic or other health record, Independently interpreting results (not separately reported) and communicating results to the patient/family/caregiver, and Care coordination (not separately reported)      Subjective   Pt seen today.   No acute event overnight.   No new concern.   Oral intake adequate.     Pt  is hemodynamically stable. Afebrile in last 24 hours. at room air and maintaining  sat.     No  fever chills nausea vomiting chest pain shortness of breath or abdominal pain.           Patient communicated understanding and support of plan. No additional complaints.       Objective:       Vital Signs:  BP 113/60  - Pulse 79  - Temp 36.9 ?C (98.4 ?F)  - Ht 157.5 cm (5' 2)  - Wt 74.5 kg (164 lb 3.2 oz)  - SpO2 99%  - BMI 30.03 kg/m?     Physical Exam  HENT:      Head: Normocephalic.      Nose: Nose normal.      Mouth/Throat:      Pharynx: Oropharynx is clear.   Eyes:      Conjunctiva/sclera: Conjunctivae normal.   Cardiovascular:      Rate and Rhythm: Normal rate and regular rhythm.      Pulses: Normal pulses.      Heart sounds: Normal heart sounds.   Pulmonary:      Effort: Pulmonary effort is normal.      Breath sounds: Normal breath sounds.   Abdominal:      Palpations: Abdomen is soft.   Musculoskeletal:         General: Normal range of motion.      Cervical back: Normal range of motion.      Right lower leg: No edema.      Left lower leg: No edema.   Skin:     General: Skin is warm.   Neurological:      General: No focal deficit present.      Mental Status: She is alert.   Psychiatric:         Mood and Affect: Mood normal.         Behavior: Behavior normal.         Allergies:  Bortezomib, Allopurinol, and Sulfa (sulfonamide antibiotics)    Intake/Output Summary:  (Last 24 hours)    Intake/Output Summary (Last 24 hours) at 05/04/2023 1306  Last data filed at 05/04/2023 1144  Gross per 24 hour   Intake 360 ml   Output 900 ml   Net -540 ml  Labs:  Recent Labs     05/02/23  0303 05/03/23  0419 05/04/23  0617 05/04/23  0650   HGB 8.6* 8.6* 8.2*  --    HCT 27.4* 26.3* 25.1*  --    WBC 7.20 8.50 8.70  --    PLTCT 288 283 271  --    NA 143 145 144  --    K 3.5 3.4* 3.3*  --    CL 108 108 106  --    CO2 25 26 28   --    BUN 35* 36* 36*  --    CR 2.41* 2.41* 2.43*  --    GLU 90 139* 112*  --    CA 8.5 8.5 8.8  --    MG 2.0 1.8 1.9  --    ALBUMIN 3.2* 3.0* 3.0* --    TOTPROT 5.0* 4.9* 4.8*  --    TOTBILI 0.8 0.7 0.8  --    AST 16 15 13   --    ALT 13 11 10   --    ALKPHOS 96 89 79  --    INR  --   --   --  1.4*   FSBS (Manual): (!) 123 (05/04/23 1143)  Glucose: (!) 112 (05/04/23 0617)  POC Glucose (Download): (!) 123 (05/04/23 1141)  BNP: No results for input(s): BNP in the last 72 hours.  CARDIAC ENZYMES: No results for input(s): CKMB in the last 72 hours.      Medications:  No current outpatient medications on file.       Recent Imaging  No results found.          Miguel Aschoff, MD  Clinical Assistant Professor   Department of Internal Medicine  Service: Med Private     Amie Critchley is the preferred method of communication.   Please use the Med Private First Call for all patient-related communications.   Personal Voaltes and pagers are not answered at all hours.

## 2023-05-05 ENCOUNTER — Encounter: Admit: 2023-05-05 | Discharge: 2023-05-05 | Payer: MEDICARE

## 2023-05-05 LAB — POC GLUCOSE
~~LOC~~ BKR POC GLUCOSE: 168 mg/dL — ABNORMAL HIGH (ref 70–100)
~~LOC~~ BKR POC GLUCOSE: 134 mg/dL — ABNORMAL HIGH (ref 70–100)
~~LOC~~ BKR POC GLUCOSE: 149 mg/dL — ABNORMAL HIGH (ref 70–100)
~~LOC~~ BKR POC GLUCOSE: 161 mg/dL — ABNORMAL HIGH (ref 70–100)

## 2023-05-05 MED ORDER — POTASSIUM CHLORIDE 20 MEQ PO TBTQ
40 meq | Freq: Every day | ORAL | 0 refills | Status: DC
Start: 2023-05-05 — End: 2023-05-06

## 2023-05-05 MED ORDER — APIXABAN 5 MG PO TAB
5 mg | Freq: Two times a day (BID) | ORAL | 0 refills | Status: DC
Start: 2023-05-05 — End: 2023-05-06
  Administered 2023-05-05 – 2023-05-06 (×2): 5 mg via ORAL

## 2023-05-05 NOTE — Progress Notes
HC5 END OF SHIFT/PLAN OF CARE NURSING NOTE   Nursing Shift: Day Shift 0700-1900    Acute events, nursing interventions, & communication with providers:Gi called to report  Pts EUS cancelled d/t RSV infection. Pt sent back to the floor from GI      Patient Goal(s)  Patient will Maintain optimal gas exchange  by the end of next shift.        Patient will  Verbalize readiness for discharge by discharge.   Admission Weight: Weight: 76.2 kg (168 lb)    Last 3 Weights:   Vitals:    05/02/23 0310 05/03/23 1048 05/04/23 0931   Weight: 76.3 kg (168 lb 3.2 oz) 74.8 kg (164 lb 12.8 oz) 74.5 kg (164 lb 3.2 oz)     Weight Change: Weight trend decreasing    Intake/Output Summary (Last 24 hours) at 05/04/2023 1950  Last data filed at 05/04/2023 1852  Gross per 24 hour   Intake 837 ml   Output 1500 ml   Net -663 ml     Last Bowel Movement Date: 05/03/23    Fluid Restriction? No   Quality/Safety    Total Fall Risk Score: 10   Risk for Injury related to falls: Coagulopathies/risk for bleed  Fall Risk Category:   History of More Than One Fall Within 6 Months Before Admission: No  Elimination, Bowel and Urine: N/A  Interventions: N/A - Does not score for risk in this category  Medications: On 2 or more high fall risk drugs  Interventions: Use of gait belt  and Stay within arm's reach during toileting/showering (i.e., dizziness, orthostasis)   Patient Care Equipment: One present  Interventions: Needs assistance with patient care equipment when ambulating and Ensure environment is free of clutter and walkways are clear from tripping hazards  Mobility: 2 - Assistance required  Interventions: Assist x1, Gait belt in use when ambulating, and Utilize walker, cane, or additional walking aid for ambulation  Cognition: 0 - No cognition issues  Interventions: N/A - Does not score as risk in this category    Other safety precautions in place: CLABSI Prevention Bundle    Restraints:  No      Patient Education  This RN provided education to Patient today. The following education topics were reviewed:  Quality/Safety Education:   CLABSI prevention  Medication Education:   Cardiac medication(s)  Education provided on the following medication(s): all medications discussed with patient  Cardiac - Specific Education:   Cardiac diet/nutrition  General Education:   Discharge planning    The following teaching method(s) were used: Verbal  Response to learning: Verbalizes Understanding  Needs reinforcement on: discharge planning

## 2023-05-05 NOTE — Progress Notes
PHYSICAL THERAPY  MOBILITY NOTE      Name: Raven [name]   MRN: [id number]     DOB: 07-Oct-1945      Age: 78 y.o.  Admission Date: 04/27/2023     LOS: 8 days     Date of Service: 05/05/2023    Patient was assigned for activity with the rehabilitation technician by the supervising therapist.  Patient declined to participate despite encouragement. Patient stated she just got done walking with therapy a little bit earlier.     Mobility       Rehab Technician: Suzie Portela  Date: 05/05/2023

## 2023-05-05 NOTE — Case Management (ED)
Case Management Progress Note    NAME:Raven [name]                          MRN: [id number]              DOB:08/01/1945          AGE: 78 y.o.  ADMISSION DATE: 04/27/2023             DAYS ADMITTED: LOS: 8 days      Today's Date: 05/05/2023    PLAN: Anticipate discharge home with family support and resumption of [facility] with [facility] Milrinone infusion.     Patients home milrinone has been delivered to [facility] five. Charge nurse placed milrinone in the medication refrigerator.  Nurse case manager update [facility] regarding no plans for discharge home today.      Expected Discharge Date: 05/05/2023 3:00 PM  Is Patient Medically Stable: No, Please explain: GI consult  Are there Barriers to Discharge? no    INTERVENTION/DISPOSITION:  Discharge Planning              Discharge Planning: Home Health, Home Infusion-[facility]  Transportation              Does the Patient Need Case Management to Arrange Discharge Transport? (ex: facility, ambulance, wheelchair/stretcher, Medicaid, cab, other): No  Will the Patient Use Family Transport?: Yes  Transportation Name, Phone and Availability #1: Bertis Ruddy (Ph: [phone])  Support              Support: Huddle/team update  Info or Referral              Information or Referral to Community Resources: No Needs Identified  Positive SDOH Domains and Potential Barriers                   Medication Needs              Medication Needs: No Needs Identified                                Financial              Financial: No Needs Identified  Legal              Legal: No Needs Identified  Other              Other/None: No needs identified  Discharge Disposition                     Selected Continued Care - Admitted Since 04/27/2023       [facility]       Service Provider Services Address Phone Fax Patient Preferred    [facility] [address] [place] [address] [phone] --              [facility]       Service Provider Services Address Phone Fax Patient Preferred    [facility] Injection [address], [place] [address] [phone] --                      Adela Lank RN BSN  Integrated Nurse Case Manager  [phone]/ [phone]  [place] [phone]

## 2023-05-05 NOTE — Progress Notes
HC5 END OF SHIFT/PLAN OF CARE NURSING NOTE   Nursing Shift: Night Shift 1900-0700    Acute events, nursing interventions, & communication with providers:   No acute events overnight. Patient got PRN tessalon perles, halls, and robitussin for cough. Patient NPO for EUS w/ biopsy today.     Patient Goal(s)  Patient will Report breathing has improved by the end of next shift.        Patient will  Verbalize readiness for discharge by discharge.   Admission Weight: Weight: 76.2 kg (168 lb)    Last 3 Weights:   Vitals:    05/03/23 1048 05/04/23 0931 05/05/23 0401   Weight: 74.8 kg (164 lb 12.8 oz) 74.5 kg (164 lb 3.2 oz) 73.3 kg (161 lb 9.6 oz)     Weight Change: Weight trend decreasing    Intake/Output Summary (Last 24 hours) at 05/05/2023 0653  Last data filed at 05/05/2023 0248  Gross per 24 hour   Intake 897 ml   Output 1800 ml   Net -903 ml     Last Bowel Movement Date: 05/05/23    Fluid Restriction? No   Quality/Safety    Total Fall Risk Score: 10   Risk for Injury related to falls: Standard risk for injury based on the ABCS scoring tool  Fall Risk Category:   History of More Than One Fall Within 6 Months Before Admission: No  Elimination, Bowel and Urine: N/A  Interventions: N/A - Does not score for risk in this category  Medications: On 2 or more high fall risk drugs  Interventions: Educate patient on medication side effects  Patient Care Equipment: One present  Interventions: Needs assistance with patient care equipment when ambulating  Mobility: 2 - Assistance required  Interventions: Assist x1  Cognition: 0 - No cognition issues  Interventions: N/A - Does not score as risk in this category    Other safety precautions in place: CLABSI Prevention Bundle    Restraints:  No      Patient Education  This RN provided education to Patient today. The following education topics were reviewed:  Quality/Safety Education:   CLABSI prevention and Pain scale  Medication Education:   Pharmacological pain intervention  Education provided on the following medication(s): robitussin  Cardiac - Specific Education:   Heart failure management  General Education:   Glucose management    The following teaching method(s) were used: Verbal  Response to learning: Verbalizes Understanding  Needs reinforcement on: Pt verbalizes understanding

## 2023-05-05 NOTE — Progress Notes
PHYSICAL THERAPY  MOBILITY NOTE      Name: Raven [name]   MRN: [id number]     DOB: 1945/11/17      Age: 78 y.o.  Admission Date: 04/27/2023     LOS: 8 days     Date of Service: 05/05/2023        Patient was assigned for activity with the rehabilitation technician by the supervising therapist.  Patient wanting rehab tech to come back later, rehab tech will come back as time allows.    Rehab Technician: Carilyn Goodpasture  Date: 05/05/2023

## 2023-05-05 NOTE — Progress Notes
Gastroenterology Consultation - Follow Up      HISTORY OF PRESENT ILLNESS  Raven [name] is a pleasant 78 y.o. 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 Patient reports that she had a maroon-colored stool over the last 24 hours. She has a history of melena back in 03/2023 for which GI was consulted and performed EGD x 2 - full reports below. On discharge, she was in her usual state of health until she developed upper respiratory symptoms and postnasal drip. This admission, she was found to be RSV positive. GI consulted and planned for EGD/EUS/Colonoscopy to evaluate hematochezia and known large lobulated pancreatic cystic mass. Due to scheduling, EGD/EUS cancelled and colonoscopy performed on 1/3. Planning EGD w/EUS for this week. EGD/EUS cancelled 1/7 and 1/8 due to concern for reactive airway with anesthesia.      24 Hour Events/Subjective:   Procedure cancelled today by anesthesia. Patient updated on plan. Hgb 9.0 this AM. Denies any overt GI bleeding signs/symptoms.     REVIEW OF MEDICAL RECORDS  Past Medical History:    Anemia    Anxiety    Arthritis    Asthma    Asymptomatic stenosis of right carotid artery    Atrial fibrillation (HCC)    Back pain    Cancer of uterus (HCC)    Chronic kidney disease    COPD (chronic obstructive pulmonary disease) (HCC)    Coronary artery disease    Diabetes mellitus (HCC)    Dyslipidemia    Flank mass    Fluid retention    GI bleed    Heart disease    Heart failure (HCC)    Hypertension    Multiple myeloma (HCC)    OSA on CPAP    Other malignant neoplasm without specification of site    Vision decreased     Surgical History:   Procedure Laterality Date    CORONARY ARTERY BYPASS GRAFT  2010    CABG 3 V    ROBOT ASSISTED LOW ANTERIOR RESECTION N/A 08/10/2022    Performed by Benetta Spar, MD at [facility] OR    ESOPHAGOGASTRODUODENOSCOPY WITH BIOPSY - FLEXIBLE N/A 09/17/2022    Performed by Tempie Hoist, DO at [facility] ENDO    CATHETERIZATION RIGHT HEART N/A 09/18/2022    Performed by Cath, Physician at [facility] CATH LAB    REMOVAL AND REPLACEMENT IMPLANTABLE DEFIBRILLATOR GENERATOR - SINGLE LEAD SYSTEM Left 11/04/2022    Performed by Kathreen Cornfield, MD at [facility] EP LAB    INSERTION/ REPLACEMENT PERMANENT PACEMAKER WITH ATRIAL LEAD Left 11/04/2022    Performed by Kathreen Cornfield, MD at [facility] EP LAB    Injection Venography Extremity Left 11/04/2022    Performed by Kathreen Cornfield, MD at [facility] EP LAB    ESOPHAGOGASTRODUODENOSCOPY WITH BIOPSY - FLEXIBLE N/A 03/29/2023    Performed by Buckles, Vinnie Level, MD at [facility] ENDO    ESOPHAGOGASTRODUODENOSCOPY WITH SNARE REMOVAL TUMOR/ POLYP/ OTHER LESION - FLEXIBLE N/A 03/29/2023    Performed by Buckles, Vinnie Level, MD at [facility] ENDO    ESOPHAGOGASTRODUODENOSCOPY WITH CONTROL OF BLEEDING - FLEXIBLE N/A 04/04/2023    Performed by Buckles, Vinnie Level, MD at [facility] ENDO    COLONOSCOPY WITH BIOPSY - FLEXIBLE N/A 04/30/2023    Performed by Onnie Boer, MD at [facility] ENDO    BONE MARROW BIOPSY  CARDIAC DEFIBRILLATOR PLACEMENT      [facility]    CAROTID ENDARDECTOMY Right     COLONOSCOPY      HX CORONARY STENT PLACEMENT      HX HEART CATHETERIZATION      HX HYSTERECTOMY      TUNNELED VENOUS PORT PLACEMENT Right     Chest     Social History     Socioeconomic History    Marital status: Widowed   Tobacco Use    Smoking status: Former     Current packs/day: 0.00     Average packs/day: 1 pack/day for 20.0 years (20.0 ttl pk-yrs)     Types: Cigarettes     Start date: 71     Quit date: 2010     Years since quitting: 15.0     Passive exposure: Never    Smokeless tobacco: Never   Vaping Use    Vaping status: Never Used   Substance and Sexual Activity    Alcohol use: Not Currently    Drug use: Not Currently     Allergies   Allergen Reactions    Bortezomib RASH     Noted in Provider note 8/22 cancer treatment drug    Allopurinol HIVES    Sulfa (Sulfonamide Antibiotics) NAUSEA ONLY     Current Facility-Administered Medications   Medication Dose Route Frequency Provider Last Rate Last Admin    acetaminophen (TYLENOL) tablet 650 mg  650 mg Oral Q6H PRN Norm Parcel, MD   650 mg at 05/03/23 2311    acyclovir (ZOVIRAX) tablet 200 mg  200 mg Oral BID Rae Mar, MD   200 mg at 05/05/23 0901    albuterol-ipratropium (DUONEB) nebulizer solution 3 mL  3 mL Inhalation Q12H & PRN Miguel Aschoff, MD   3 mL at 05/04/23 2110    amiodarone (CORDARONE) tablet 200 mg  200 mg Oral Barry Dienes, MD   200 mg at 05/05/23 0901    amitriptyline/gabapentin/baclofen(#) 2/6/2 % topical cream   Topical TID PRN Norm Parcel, MD   Given at 04/30/23 2219    benzonatate (TESSALON PERLES) capsule 100 mg  100 mg Oral TID PRN Joaquin Music, MD   100 mg at 05/05/23 0912    budesonide-formoterol HFA (SYMBICORT) 160-4.5 mcg/actuation inhalation 2 puff  2 puff Inhalation BID Norm Parcel, MD   2 puff at 05/04/23 2112    CHOLEcalciferoL (vitamin D3) tablet 1,000 Units  1,000 Units Oral Barry Dienes, MD   1,000 Units at 05/05/23 0901    dextromethorphan-guaiFENesin (ROBITUSSIN-DM) oral syrup 10 mL  10 mL Oral Q4H PRN Rae Mar, MD   10 mL at 05/05/23 0912    dextrose 50% (D50) syringe 25-50 mL  12.5-25 g Intravenous PRN Norm Parcel, MD        eucalyptus-menthoL (HALLS) lozenge 1 lozenge  1 lozenge Oral Q2H PRN Rae Mar, MD   1 lozenge at 05/05/23 0906    ferrous sulfate (FEOSOL) tablet 325 mg  325 mg Oral Barry Dienes, MD   325 mg at 05/05/23 0901    insulin aspart (U-100) (NOVOLOG FLEXPEN U-100 INSULIN) injection PEN 0-6 Units  0-6 Units Subcutaneous ACHS (22) Norm Parcel, MD   1 Units at 04/29/23 1225    magnesium oxide (MAGOX) tablet 400 mg  400 mg Oral QHS Elpidio Galea L, APRN-NP   400 mg at 05/04/23 2123    melatonin tablet 5 mg  5 mg Oral QHS PRN Norm Parcel, MD  5 mg at 05/03/23 2311    milrinone (PRIMACOR) 20 mg/D5W 100 mL infusion  0.3 mcg/kg/min (Dosing Weight) Intravenous Continuous Norm Parcel, MD 7 mL/hr at 05/05/23 0907 0.3 mcg/kg/min at 05/05/23 0907    pantoprazole DR (PROTONIX) tablet 40 mg  40 mg Oral BID Norm Parcel, MD   40 mg at 05/05/23 0910    polyethylene glycol 3350 (MIRALAX) packet 17 g  1 packet Oral QDAY PRN Norm Parcel, MD   17 g at 05/02/23 0849    potassium chloride SR (K-DUR) tablet 40 mEq  40 mEq Oral TID w/ meals Elpidio Galea L, APRN-NP   40 mEq at 05/05/23 0901    rosuvastatin (CRESTOR) tablet 10 mg  10 mg Oral QHS Norm Parcel, MD   10 mg at 05/04/23 2123    sennosides-docusate sodium (SENOKOT-S) tablet 1 tablet  1 tablet Oral QDAY PRN Norm Parcel, MD   1 tablet at 04/29/23 1007    sertraline (ZOLOFT) tablet 50 mg  50 mg Oral Barry Dienes, MD   50 mg at 05/05/23 0901    torsemide (DEMADEX) tablet 40 mg  40 mg Oral BID(9-17) Denita Lung, APRN-NP   40 mg at 05/04/23 4854     I personally reviewed past medical history, family history, and social history.     REVIEW OF SYSTEMS  HEENT: No loss of vision or bleeding gums   Cardiovascular: No chest pain    Respiratory: No wheezing   Gastrointestinal: See HPI above.     PHYSICAL EXAMINATION  Vital Signs: BP 95/57 (BP Source: Arm, Right Upper)  - Pulse 104  - Temp 36.7 ?C (98.1 ?F)  - Ht 157.5 cm (5' 2)  - Wt 73.3 kg (161 lb 9.6 oz)  - SpO2 94%  - BMI 29.56 kg/m?   Body mass index is 29.56 kg/m?Marland Kitchen  Physical Exam  Constitutional:       General: She is awake.   Cardiovascular:      Rate and Rhythm: Normal rate.   Pulmonary:      Effort: Pulmonary effort is normal. No respiratory distress.   Abdominal:      General: Bowel sounds are normal. There is no distension.      Palpations: Abdomen is soft.      Tenderness: There is no abdominal tenderness.   Neurological:      Mental Status: She is alert.   Psychiatric:         Mood and Affect: Mood normal.         Speech: Speech normal. RECENT LABS - Pertinent labs reviewed by me personally and abbreviated in HPI above.     RADIOLOGY - I have personally reviewed the following pertinent radiology images and/or reports:     CT AP WO Contrast (04/02/2023)  1. No acute abdominopelvic inflammatory mass or bowel obstruction.   2. Distal colonic anastomosis. Mild distal colonic diverticulosis.   3. Mild increase in size of indeterminate large lobulated cystic mass in the tail of pancreas. Endoscopic ultrasound recommended for further evaluation.   4. Partially visualized mild cardiomegaly. Mild interlobular septal thickening with trace right pleural effusion suggesting CHF/fluid overload. Tree-in-bud and multiple subcentimeter nodular opacities are indeterminate and may be from edema, atypical infection, and/or aspiration. Short follow-up nonemergent CT chest in 6-8 weeks recommended for reevaluation.     GI PROCEDURES - I have personally reviewed the following pertinent GI procedure images and/or reports:     Colonoscopy (04/30/2023)  - No blood or active  bleeding on entire exam. Suspect prior bleeding may have been hemorrhoidal vs diverticular based on findings.   - Retained endoscopic clip in ascending colon.   - Patent end-to-end colo-colonic anastomosis, characterized by visible sutures and healthy appearing mucosa.   - Diverticulosis in the sigmoid colon, in the descending colon and in the transverse colon.   - Non-bleeding external and internal hemorrhoids.   - The examination was otherwise normal.     EGD (04/04/2023)  - Z-line regular, 37 cm from the incisors.   - Esophagogastric landmarks identified.   - Normal stomach.   - At the D1-D2 junction there was the site of previous polypectomy with one endoclip on it and the other endoclip one fold distal to it. At the tip of the post polypectomy site was an ulcer with a visible vessel [facility] IIa). There was no active bleeding. Treated with epinephrine injection at the base, thermal therapy using gold probe and mechanical therapy using one endoclip.      EGD (03/29/2023)  - Normal esophagus   - 3 cm type-I sliding hiatal hernia.   - Normal stomach.   - A few duodenal polyps.   - 1.5 cm pedunculated duodenal polyp. Resected and retrieved. Two clips (MR conditional) were placed. Clip manufacturer: [employer].     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 Ponciano Ort clinic APP regarding EUS scheduling. This procedure to be done outpatient in the next coming weeks. Scheduling to reach out to patient.  - Eliquis will need to be held prior to procedure for 2 days if able  - No barriers to resuming Eliquis at this time from GI standpoint  - Monitor for overt GI bleeding signs/symptoms  - Monitor electrolytes and replace as needed  - Trend H/H, transfuse pRBCs per protocol if Hgb <7gm/dl    Total Time Today was 35 minutes in the following activities: Preparing to see the patient, Obtaining and/or reviewing separately obtained history, Performing a medically appropriate examination and/or evaluation, Counseling and educating the patient/family/caregiver, Documenting clinical information in the electronic or other health record, Independently interpreting results (not separately reported) and communicating results to the patient/family/caregiver, and Care coordination (not separately reported)    Patient discussed and plan agreed upon with Dr. Janene Madeira. GI will sign off. Thank you for this consultation. It has been a pleasure to be a part of the care team. Please feel free to contact the GI consult team with any questions or concerns.    Epifanio Lesches, MSN, APRN, FNP-C  [facility]  Division of Gastroenterology  Available via [place]  [phone] [phone], otherwise contact GI fellow on call

## 2023-05-05 NOTE — Progress Notes
Heart Failure Progress Note    NAME:Raven [name]             MRN: [id number]                 DOB:1945/12/08          AGE: 78 y.o.  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 She follows with Dr. Herma [name]. Raven [name] and Raven Abraham, APRN. Marland Kitchen    She presented to the ED on 04/27/23 reporting rectal bleeding, had recently had dark jellylike stool, abdominal discomfort, fatigue, nausea, had recently been diagnosed with RSV therefore was also having coughing symptoms.  Labs revealed stable H&H of 9.5/30.5, NT proBNP 12,625, high-sensitivity troponin levels 33-39-40.  Chest x-ray suggestive of pulmonary edema.  Unfortunately Raven [name] has had 4 hospitalizations over the last month as outlined below; this is her 8th admission over the last 9 months.     Admitted from 11/27-12/4/24 with melena and anemia, received PRBCs and underwent EGD on 12/2 which revealed 1.5 cm pedunculated viable polyp in duodenum which was clipped, Aspirin and Plavix were discontinued and she remained on Eliquis for anticoagulation.  Admitted from 12/6-12/13/24 with hematemesis, melena, symptomatic anemia.  Although her Plavix and aspirin had been discontinued during prior admission, pt reported she continued taking both aspirin and Plavix along with her Eliquis.  She required x4 units of PRBCs. Repeat EGD on 12/8 showed ulcer in duodenum and visible vessel with no active bleeding which was injected with epinephrine and Endo Clip placed.  Admitted from 12/15-12/19/24 for decompensated heart failure, required IV diuresis. It was noted that her milrinone infusion had been off for unknown amount of time.  For ongoing anemia she required x1 unit of PRBCs.  Admitted from 12/23-12/27/24 after she presented to HF clinic reporting symptoms of shortness of breath, concerns of hypervolemia and ~10 lbs weight gain.  She received IV diuresis, had developed a cough which was later identified as RSV when she was seen by her local ED.    Recommendations 05/05/23:  Chronic cardiogenic shock: continue home milrinone infusion at 0.3 mcg/kg/min  Diuretics: hold torsemide  AM and resume Torsemide 40 mg po bid 1/8 PM (ordered).   K-dur 40 mEq x 1 on 1/8 given reduced diuretic dose today.  Will assess potassium level on 1/9 and determine ongoing dose.  Continue Mg oxide 400 mg nightly.  1.9 today.   HFrEF GDMT: limited with CKD and hx cardiogenic shock. Toprol XL stopped during hospitalization 12/23-12/27. Will not re-add at this time. Previously on [name] which was recently dc'd, defer resumption at this time with GFR 20  Paroxsymal AFib: Resume Eliquis 5 mg twice daily (ordered) since GI procedure has been canceled d/t RSV.  Would recommend holding Eliquis 48 hours prior to EGD/EUS which is now to take place as an outpatient.  Continue PTA Amiodarone 200 mg daily. Messaged Watchman navigator 04/29/23 to schedule appointment to discuss Watchman outpatient. [id number] but re-current admissions with GI bleed concerns requiring PRBCs and some interventions (none this stay); certainly risk benefit conversation.   CAD: continue PTA Rosuvastatin 10 mg daily  Hx GIB: H/H remains stable; monitor closely for signs of bleeding. GI following. Colonoscopy 1/3 revealed no acute findings. Plan for EGD with EUS as an outpatient.    HF team will continue to follow.     Ongoing:  BMP once a day. Mg level daily. Maintain K+ over 4.0 and Mg+ over 2.0   2000mg  sodium dietary restriction.  Fluid Restriction:1.5 L  Strict I/O. Goal output: net neg 2L/24 hour  Daily standing scale weight. Goal Dry Weight: unknown.  ~160-162 lbs  Please place a specimen in lab NT-proBNP order on day of discharge in effort to establish baseline value correlating with suspected euvolemia.    Primary team responsible for placing Post-Discharge Health System Appointment Request order set for Cardiology: Heart Failure appointment request within 24-48 hours of discharge. (Please do not place order any earlier in effort to reduce possible need for cancellation and rescheduling of visit).    Pt seen and discussed with Dr. Glean [name].  Plan communicated to primary team.      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   She presents with signs of hypervolemia with Left  ventricular failure with signs of low flow state.    BNP Labs:   Lab Results   Component Value Date    NTPROBNP 12,625 (H) 04/27/2023    NTPROBNP 11,636 (H) 04/19/2023    NTPROBNP 11,324 (H) 04/12/2023      High Sensitivity Troponin:  Lab Results   Component Value Date    HSTROP0HR 33 (H) 04/27/2023    HSTROP2HR 39 (H) 04/27/2023    HSTROP4HR 40 (H) 04/27/2023    HSTROPDELT4 1 04/27/2023    HSTROPDELTA -2 04/19/2023    HIGHSTROPI 28 (H) 04/12/2023     Admission Weight: 76.2 kg (168 lb)        Most recent weights (inpatient):   Vitals:    05/03/23 1048 05/04/23 0931 05/05/23 0401   Weight: 74.8 kg (164 lb 12.8 oz) 74.5 kg (164 lb 3.2 oz) 73.3 kg (161 lb 9.6 oz)  I/O: Net - 0.9 L : 24 hours                Net -736 mL :Stay /last 2 weeks                  Urine 24 hours: 1.8 L      PLAN:  Diuretic Therapy    Prior to admission dose Torsemide 20 mg BID + Kdur 20 mEq BID      Given on admission 12/31: IV Lasix 60 mg x 1      Daily Dosing 1/1-1/2: Bumex 2 mg IV BID  1/3: bumex 2mg  IV BID  1/4 increase to bumex 3mg  IV BID  1/5:Bumex 3 mg IV bid + 5 mg metolazone as well    1/6: Bumex 3 IV a.m. and torsemide 40 mg po bid started p.m.  1/7: Torsemide 40 mg po bid + 5 mg metolazone  1/8: Torsemide held a.m. and resumed 40 mg PO BID starting in PM     *Of note, she dropped > 3 lbs with dose of 5 mg metolazone so this will be good to utilize in outpatient setting-may need to schedule once weekly if weight up at 1 week f/u.  168 lbs>164.8 lbs today 1/6.     Guideline Directed Medical Therapy PTA Inpatient Changes   ACEI/ARB N/A-CKD IV    ARNI N/A-CKD IV    BB N/A-cardiogenic shock on home inotrope; previously tolerated Toprol XL which was discontinued during most recent hospitalization.  Will not resume at this time   SGLT-2 Inhibitor N/A- recently on [name] 25 mg/day which was dc'd during 12/15-12/19 admission Defer resumption with CrCl <20 mL/min   Mineralocorticoid Receptor Antagonist N/A-CKD IV    Hydralazine/Nitrate N/A, BP well controlled    Ivabradine N/A, cardiogenic shock    Heart Rhythm Management Therapy Yes (ICD) 04/12/23 interrogation stable, no recent events   Anticoagulation for Afib/flutter apixaban (Eliquis) 5 mg BID Held on admission with concern for GIB  1/8: Eliquis resumed.  Recommend holding 48 hours prior to GI procedure as an outpatient.   Cardiac Rehab Evaluation for LVEF < or equal to 40% Yes; Date ordered: 04/12/23    7-Day post hospital follow up scheduled within 24-48 hours of discharge No Primary team to schedule a follow up using Post-Discharge Health System Appointment Request Order Set for Cardiology: Heart Failure appt request within 24-48 hours of discharge      Inotrope Therapy Dose & Adjustments  (document symptom and hemodynamic response if pertinent)   Milrinone 0.3 mcg/kg/min Started during hospitalization May 2024     Testing/Procedures   04/12/23 Echo:  Eccentric hypertrophy of the left ventricle with severe dilatation of the left ventricular chamber  Left ventricular systolic function is moderately to severely impaired with an estimate ejection fraction of 30% and global hypokinesis  Right ventricle is moderately dilated with normal function  The left atrium is severely dilated  Pacemaker lead seen in the right atrium and right ventricle  Severe mitral valve insufficiency  Severe tricuspid valve insufficiency  No pericardial effusion  Severe pulmonary hypertension with estimated peak systolic pulmonary artery pressure of 73 mmHg    RHC 09/18/22 (weight 165 pounds)  RA 13  RV 70/12  PA 72/27 with mean 47  PCWP 17  TPG 30  PVR 12  CO/CI 2.5/1.4 by thermo  CO/CI 1.9/1.1 by Fick      Heart Failure Remote Monitoring and/or Heart Failure Research Candidacy   Patient is not  an appropriate candidate for any remote monitoring or heart failure research studies due to  end stage HF .    CAD s/p CABG x3 (2010)  - 12/31 admit troponin 33>39>40, EKG without acute ischemic changes   > cont statin therapy; aspirin and Plavix recently dc'd with GIB    S/p ICD  - device evaluation 04/12/23 AP <1%/VP <1%  - ICD interrogation 04/29/23: AP <1%/VP <1% with presenting rhythm AS-VS 90s with elevated HF diagnostics    HLD  Lab Results   Component Value Date    CHOL 107 09/16/2022    TRIG 72 09/16/2022    HDL 50 09/16/2022    LDL 45 09/16/2022    VLDL 14 09/16/2022    NONHDLCHOL 57 09/16/2022   > cont PTA Rosuvastatin 10 mg/day    Paroxysmal AFib  - PTA anticoagulated with Eliquis 5 mg BID held on admission  - cont PTA Amiodarone 200 mg/day  - recommend evaluation for Watchman in outpatient setting   > 1/8: Eliquis resumed.  Recommend holding 48 hours prior to EGD with EUS that is now scheduled to take place as an outpatient.    CKD stage IV  - 12/31 admit creat 2.36  - baseline creat ranges 2.4-2.6  Lab Results   Component Value Date    CR 2.72 (H) 05/05/2023    CR 2.43 (H) 05/04/2023    CR 2.41 (H) 05/03/2023   > avoid nephrotoxins, daily labs to follow    Chronic Anemia  Frequent GIB  Pancreatic cystic mass  - 12/31 admit H/H 9.5/30.5  Lab Results   Component Value Date    HGB 9.0 (L) 05/05/2023    HGB 8.2 (L) 05/04/2023    HGB 8.6 (L) 05/03/2023     Lab Results   Component Value Date    IRON 25 (L) 04/28/2023    PSAT 17 (L) 04/28/2023    TIBC 148 (L) 04/28/2023    FERRITIN 950 (H) 04/28/2023    > s/p IV Venofer 300 mg x1 on 03/31/23  - 04/30/23: Colonoscopy: no active bleeding or blood->suspect prior bleeding may have been hemorrhoidal vs diverticular based on findings  > Plan for EGD with EUS as an outpatient in coming weeks to evaluate known large lobulated pancreatic cystic mass    Hx DM 2  Lab Results   Component Value Date    HGBA1C 6.3 (H) 04/20/2023    HGBA1C 8.0 (H) 09/16/2022    HGBA1C 7.4 (H) 07/14/2022    - no PTA meds; previously on [name] 25 mg daily, this was discontinued during recent admission   > managed per primary team     RSV  - supportive care per primary team    Multiple myeloma  Follows at [facility], was seen during this admission by Dr. Mitchell Heir.    She had a repeat CBC done at [address] [facility] On 11/21 that shows gradual downtrending, hemoglobin was 8.8, hematocrit 27.2.  -She follows with  [facility] oncology she was given Darzalex subcutaneous as well as Retacrit.       _____________________________________________________________________________  Subjective:   She currently complains of  productive cough and peripheral edema. She reports the edema is improved. She is happy her weight is down to 161 and reports this as her normal range. She is some vomiting this AM from coughing but denies nausea.     She currently denies paroxysmal nocturnal dyspnea, orthopnea, lightheadedness, positional lightheadedness, palpitations, chest pain, and abdominal fullness.  Objective:                       Vital Signs:  Last Filed                Vital Signs: 24 Hour Range   BP: 104/52 (01/08 0458)  Temp: 37.2 ?C (99 ?F) (01/08 0458)  Pulse: 104 (01/08 0458)  Respirations: 18 PER MINUTE (01/08 0458)  SpO2: 94 % (01/08 0458)  O2%: 21 % (01/07 2110)  O2 Device: None (Room air) (01/08 0458)  BP: (102-119)/(51-63)   Temp:  [36.4 ?C (97.6 ?F)-37.2 ?C (99 ?F)]   Pulse:  [79-106]   Respirations:  [16 PER MINUTE-18 PER MINUTE]   SpO2:  [94 %-99 %]   O2%:  [21 %]   O2 Device: None (Room air)           Wt Readings from Last 10 Encounters:   05/05/23 73.3 kg (161 lb 9.6 oz)   04/23/23 76 kg (167 lb 9.6 oz)   04/15/23 77.6 kg (171 lb)   04/06/23 75.9 kg (167 lb 5.3 oz)   03/26/23 74.2 kg (163 lb 9.3 oz)   03/19/23 77 kg (169 lb 12.8 oz)   02/16/23 75.3 kg (166 lb)   02/15/23 75.3 kg (166 lb)   01/04/23 75.2 kg (165 lb 12.8 oz)   12/14/22 74.6 kg (164 lb 6.4 oz)     Physical Exam:    General Appearance: no distress  Neck Veins: JVP 10 cm at 90 degrees, HJR positive sitting upright in chair, (she does have severe TR)  Respiratory: coarse t/o, productive thick cough  Cardiac Auscultation: Regular rhythm, S1, S2, no S3 or S4, grade 2/6 murmur  Lower Extremity Edema: trace-1 +bilateral LE to mid tibia (puffy)  Abdominal Exam: soft, less distended, non-tender, bowel sounds normal  Orientation: clear historian, good insight    Laboratory Review:   CBC w/Diff    Lab Results   Component Value Date/Time    WBC 11.00 05/05/2023 03:47 AM    RBC 3.08 (L) 05/05/2023 03:47 AM    HGB 9.0 (L) 05/05/2023 03:47 AM    HCT 27.4 (L) 05/05/2023 03:47 AM    MCV 89.0 05/05/2023 03:47 AM    MCH 29.1 05/05/2023 03:47 AM    MCHC 32.7 05/05/2023 03:47 AM    RDW 24.3 (H) 05/05/2023 03:47 AM    PLTCT 280 05/05/2023 03:47 AM    MPV 8.1 05/05/2023 03:47 AM    Lab Results   Component Value Date/Time    NEUT 68 05/05/2023 03:47 AM    ANC 7.50 (H) 05/05/2023 03:47 AM    LYMA 19 (L) 05/05/2023 03:47 AM    ALC 2.10 05/05/2023 03:47 AM    MONA 9 05/05/2023 03:47 AM    AMC 1.00 (H) 05/05/2023 03:47 AM    EOSA 3 05/05/2023 03:47 AM    AEC 0.30 05/05/2023 03:47 AM    BASA 1 05/05/2023 03:47 AM    ABC 0.10 05/05/2023 03:47 AM         Chemistry    Lab Results   Component Value Date/Time    NA 145 05/05/2023 03:47 AM    K 3.9 05/05/2023 03:47 AM    CL 105 05/05/2023 03:47 AM    CO2 28 05/05/2023 03:47 AM    GAP 12 05/05/2023 03:47 AM    BUN 36 (H) 05/05/2023 03:47 AM    CR 2.72 (H) 05/05/2023 03:47 AM    GLU  144 (H) 05/05/2023 03:47 AM    MG 1.9 05/05/2023 03:47 AM    Lab Results   Component Value Date/Time    CA 8.8 05/05/2023 03:47 AM    PO4 4.0 04/27/2023 06:29 PM    ALBUMIN 3.4 (L) 05/05/2023 03:47 AM    TOTPROT 5.4 (L) 05/05/2023 03:47 AM    ALKPHOS 96 05/05/2023 03:47 AM    AST 13 05/05/2023 03:47 AM    ALT 11 05/05/2023 03:47 AM    TOTBILI 0.8 05/05/2023 03:47 AM    GFR 17 (L) 05/05/2023 03:47 AM            Renal Function    Lab Results   Component Value Date/Time    NA 145 05/05/2023 03:47 AM    K 3.9 05/05/2023 03:47 AM    CL 105 05/05/2023 03:47 AM    CO2 28 05/05/2023 03:47 AM    GAP 12 05/05/2023 03:47 AM    BUN 36 (H) 05/05/2023 03:47 AM    BUN 36 (H) 05/04/2023 06:17 AM    BUN 36 (H) 05/03/2023 04:19 AM    Lab Results   Component Value Date/Time    CR 2.72 (H) 05/05/2023 03:47 AM    CR 2.43 (H) 05/04/2023 06:17 AM    CR 2.41 (H) 05/03/2023 04:19 AM    GLU 144 (H) 05/05/2023 03:47 AM    CA 8.8 05/05/2023 03:47 AM    PO4 4.0 04/27/2023 06:29 PM    ALBUMIN 3.4 (L) 05/05/2023 03:47 AM        Lipid Profile INR   Lab Results   Component Value Date    CHOL 107 09/16/2022    TRIG 72 09/16/2022    HDL 50 09/16/2022    LDL 45 09/16/2022    VLDL 14 09/16/2022    NONHDLCHOL 57 09/16/2022         Lab Results   Component Value Date    INR 1.4 (H) 05/04/2023          04/27/23 Chest X-Ray:   Similar cardiac silhouette enlargement with persistent perihilar and basal opacities and vascular indistinctness, favored to represent edema.      Tele:  SR-ST, PVCs, HR 90s-100s    04/27/23 ECG:   SR w/ 1st degree A/V block, occasional PVCs, HR 85 bpm, QTc 504

## 2023-05-05 NOTE — Progress Notes
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    Benign essential hypertension    Cardiomyopathy, ischemic    Paroxysmal A-fib (HCC)    ICD (implantable cardioverter-defibrillator) in place    Persistent atrial fibrillation (HCC)    Acute on chronic HFrEF (heart failure with reduced ejection fraction) (HCC)      Assessment and Plan   Principal Problem:    GI bleed  Active Problems:    Multiple myeloma (HCC)    Receiving inotropic medication    Coronary artery disease involving native coronary artery of native heart without angina pectoris    Type 2 diabetes mellitus without complications (HCC)    Chronic kidney disease, stage 4 (severe) (HCC)    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     Of note, this is Raven [name]'s eighth hospitalization at [facility] this year. Recurrent hospitalizations for HF exacerbation, GI bleeds.      Hematochezia  -Patient endorses 1 day history of bright red blood per rectum  -Hemoglobin on admission 9.5 which appears to be around baseline  -12/2 EGD: 1.5 cm friable polyp in duodenum s/p clip x2, no bleeding seen   -12/8 EGD with ulcer in duodenum and visible vessel, no active bleeding. Endo Clip placed.  -Patient currently hemodynamically stable  -Hematochezia possibly secondary to diverticular bleed versus angiodysplasia versus hemorrhoidal bleed  Plan  -Consult GI. Colonoscopy done on April 30, 2023.  No blood or dark activity on antigen.  Hemorrhoidal versus diverticular.  Retained endoscopically in ascending colon.  Patent end-to-end close anastomosis clear about visible suture and healthy-appearing was appear diverticulosis in sigmoid colon.  Nonbleeding external/internal hemorrhoids.  Otherwise normal.  -H&H stable.  Target Hb is above 7.  -Obtain type and cross  -Hold Eliquis.  -GI team is on board.  Appreciate their help.   - EUS with biopsy again canceled today.  GI will schedule as an outpatient.  They signed off.  They are okay to discharge her.     Cough  RSV infection  -  Patient endorses nonproductive cough and shortness of breath  -Tested positive for RSV in the ED  -Chest x-ray revealed similar cardiac silhouette enlargement with persistent perihilar and basilar opacities and vascular destructiveness favored to represent edema  Plan  -Continue with supportive therapy for now. Patient on room air   -Cough suppressants added     Acute on chronic combined systolic and diastolic heart failure  On chronic milrinone  Ischemic cardiomyopathy  CAD s/p CABG and PCI  CRT-D in situ  PAF  -Recent hospitalization 12/23-27 with HF exacerbation.   -Echo from 04/12/2023 revealed EF of 30% with grade 3 diastolic dysfunction and severe mitral and tricuspid valve regurgitation  -EKG revealed sinus rhythm with first-degree AV block and PVCs  -NT proBNP elevated at 12K. Has been >10k at baseline. Most recently 11,636 04/19/23  -Chest x-ray revealed pulmonary edema  -Suspect shortness of breath likely has a component of heart failure exacerbation  -PTA on torsemide 20 mg BID  Plan  -HF following.  Appreciate their help.    -They recommended to continue torsemide 40 mg twice daily..  -Torsemide held today because of worsening creatinine.  Discussed with cardiology team they are okay with that.  - Continue potassium 40 mill equivalent daily.  Repeat labs in the morning.  - Magnesium oxide 400 mg nightly.  -20 mg potassium twice daily on discharge.  Appreciate heart failure team.  -Daily weight and I&O  -Resume PTA milrinone  -Resume PTA amiodarone for PAF  -Holding Eliquis due to suspected GI bleed. Discussed with cardiology, may need to consider stopping eliquis with recurrent GI bleeds.  Cardiology is planning to schedule Watchman procedure as an outpatient.    -     CKD stage IV  -Baseline creatinine appears to be anywhere between 2.3-2.6  -On admission creatinine 2.36  Plan  -Monitor renal function closely while diuresing.   -Daily CMP  -Her creatinine is slightly up.  2.72 with GFR of 17.  Slightly up and about from baseline.  Continue to hold diuresis today.  Discussed with cardiology team they are okay with that.  Will repeat labs in the morning and will reassess her in morning  regarding further diuresis.     Multiple myeloma  -Follows at [facility] and currently on Daratumumab  -Continue PTA acyclovir for prophylaxis  -Follow-up with hematology as outpatient     DM type II  -Most recent hemoglobin A1c from 04/20/2023 was 6.3  -Will start low-dose correction factor.  -Blood glucose under well control.     COPD  Pulmonary nodules  -Continue PTA albuterol and Symbicort  -Follow-up for pulmonary nodules recommended after 6 to 8 weeks     Pancreatic cyst  -CT abdomen/pelvis from 04/02/2023 revealed mild increase in size of indeterminant large lobulated cystic mass in the tail of the pancreas  -Endoscopic ultrasound recommended  -Follow-up with GI as outpatient                    FEN: no IVF, electrolytes reviewed. CLD   Prophylaxis:  Hold due to concern for GI bleed  Disposition: Admit to medicine.    Code status: Full code (Discussed on 04/27/2023)        Discussed with the cardiology and GI team.  Spoke with Mr. [name] on phone and updated about his mother.    Diet: ADVANCE DIET AS TOLERATED  DIET CARDIAC (LOW FAT/LOW NA) & DIABETIC  DVT PPx:  SCD   Code Status: Full Code  Dispo: Continue inpatient admission   PCP: Barbaraann Rondo  Payor: [facility] MEDICARE / Plan: [facility] MEDICARE REPLACEMENT - LIFE1 / Product Type: Medicare /   Future Appointments   Date Time Provider Department Center   09/16/2023  1:30 PM Vanetta Shawl I, MD [facility] CVM Exam   09/17/2023  2:30 PM CMPB3 PACEMAKER [facility] CVM Procedur   09/17/2023  3:00 PM Emert, Catalina Pizza, MD [facility] CVM Exam               Patient was discussed at multidisciplinary rounds which included nurse, pharmacist, social work, and nurse case management who provided additional information that affected the patient's care.            Miguel Aschoff, MD  Clinical Assistant Professor   Department of Internal Medicine  Service: Med Private     Amie Critchley is the preferred method of communication.   Please use the Med Private First Call for all patient-related communications.   Personal Voaltes and pagers are not answered at all hours.      Total Time Today was 50 minutes in the following activities: Preparing to see the patient, Obtaining and/or reviewing separately obtained history, Performing a medically appropriate examination and/or evaluation, Counseling and educating the patient/family/caregiver, Ordering medications, tests, or procedures, Referring and communication with other health care professionals (when not separately reported), Documenting clinical information in the electronic or other health record, Independently interpreting results (not separately reported) and communicating results to the patient/family/caregiver, and Care coordination (not separately reported)      Subjective   Pt seen today.   No acute event overnight.   No new concern.  Currently she is at her target weight.  She is so happy about that.  In the morning she was n.p.o. for possible procedure however it is canceled.  Discussed with GI they will do it outpatient.  Patient  agreed with that.  Oral intake adequate.     Pt  is hemodynamically stable. Afebrile in last 24 hours.   at room air and maintaining  sat.     No  fever chills nausea vomiting chest pain shortness of breath or abdominal pain.          Patient communicated understanding and support of plan. No additional complaints.       Objective:       Vital Signs:  BP 106/51 (BP Source: Arm, Right Upper)  - Pulse 93  - Temp 37 ?C (98.6 ?F)  - Ht 157.5 cm (5' 2)  - Wt 73.3 kg (161 lb 9.6 oz)  - SpO2 94%  - BMI 29.56 kg/m?     Physical Exam  HENT:      Head: Normocephalic.      Nose: Nose normal.      Mouth/Throat:      Pharynx: Oropharynx is clear.   Eyes:      Conjunctiva/sclera: Conjunctivae normal.   Cardiovascular:      Rate and Rhythm: Normal rate and regular rhythm.      Pulses: Normal pulses.      Heart sounds: Normal heart sounds.   Pulmonary:      Effort: Pulmonary effort is normal.      Breath sounds: Normal breath sounds.   Abdominal:      Palpations: Abdomen is soft.   Musculoskeletal:         General: Normal range of motion.      Cervical back: Normal range of motion.      Right lower  leg: No edema.      Left lower leg: No edema.   Skin:     General: Skin is warm.   Neurological:      General: No focal deficit present.      Mental Status: She is alert.   Psychiatric:         Mood and Affect: Mood normal.         Behavior: Behavior normal. Allergies:  Bortezomib, Allopurinol, and Sulfa (sulfonamide antibiotics)    Intake/Output Summary:  (Last 24 hours)    Intake/Output Summary (Last 24 hours) at 05/05/2023 1233  Last data filed at 05/05/2023 0931  Gross per 24 hour   Intake 1047 ml   Output 2300 ml   Net -1253 ml           Labs:  Recent Labs     05/03/23  0419 05/04/23  0617 05/04/23  0650 05/05/23  0347   HGB 8.6* 8.2*  --  9.0*   HCT 26.3* 25.1*  --  27.4*   WBC 8.50 8.70  --  11.00   PLTCT 283 271  --  280   NA 145 144  --  145   K 3.4* 3.3*  --  3.9   CL 108 106  --  105   CO2 26 28  --  28   BUN 36* 36*  --  36*   CR 2.41* 2.43*  --  2.72*   GLU 139* 112*  --  144*   CA 8.5 8.8  --  8.8   MG 1.8 1.9  --  1.9   ALBUMIN 3.0* 3.0*  --  3.4*   TOTPROT 4.9* 4.8*  --  5.4*   TOTBILI 0.7 0.8  --  0.8   AST 15 13  --  13   ALT 11 10  --  11   ALKPHOS 89 79  --  96   INR  --   --  1.4*  --    Glucose: (!) 144 (05/05/23 0347)  POC Glucose (Download): (!) 134 (05/05/23 1153)  BNP: No results for input(s): BNP in the last 72 hours.  CARDIAC ENZYMES: No results for input(s): CKMB in the last 72 hours.      Medications:  No current outpatient medications on file.       Recent Imaging  No results found.          Miguel Aschoff, MD  Clinical Assistant Professor   Department of Internal Medicine  Service: Med Private     Amie Critchley is the preferred method of communication.   Please use the Med Private First Call for all patient-related communications.   Personal Voaltes and pagers are not answered at all hours.

## 2023-05-05 NOTE — Progress Notes
PHYSICAL THERAPY  PROGRESS NOTE          Name: Raven [name]   MRN: [id number]     DOB: 27-Jun-1945      Age: 78 y.o.  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 Presents to emergency department with complaints of hematochezia and cough admitted for further evaluation. Of note, this is Ms. Winzer's 8th admission over the last 9 months.  Mental / Cognitive: Alert;Oriented;Cooperative;Follows commands  Pain: No complaint of pain  Pain Interventions: Patient assisted into position of comfort  Patient attempted this morning for PT; upon gowning to enter room, provider present. Returned back in PM with note reflecting 2nd attempt.     Home Living Situation  Lives With: Family  Type of Home: House  Entry Stairs: 2  Comments: Pt reports her son and DIL provide assistance when navigating stairs.  In-Home Stairs: 1 flight;Stair glide  Bathroom Setup: Tub/shower unit  Patient Owned Equipment: Bathing: Soil scientist: Public house manager)  Comments: Pt reports her son and DIL provide assistance when navigating stairs, pt reports they can provide consistent assist as needed. Pt uses RW within home and electric scooter for community distances.    Prior Level of Function  Level Of Independence: Independent with ADL and household mobility with device;Assist needed for IADL  Required Assist For: All home functioning ADL;Meal preparation;Medication/finance management;Stairs;Transportation/driving  Comments: Pt reports independence w/ ADLs (dressing, showering, toileting). Pt reports his son and DIL assist w/ IADLs (cooking, cleaning, laundry).  History of Falls in Past 3 Months: No       Bed Mobility/Transfer  Comments: Patient up in chair upon arrival.  Transfer Type: Sit to/from Stand  Transfer: Assistance Level: To/From;Bed Side Chair;Standby Assist  Transfer: Assistive Device: Nurse, adult  Transfers: Type Of Assistance: For Safety Considerations  End Of Activity Status: Up in Chair;Nursing Notified;Instructed Patient to Request Assist with Mobility;Instructed Patient to Use Call Light    Balance  Standing Balance: Static Standing Balance;Dynamic Standing Balance;2 UE support;Standby Assist    Gait  Gait Distance: 90 feet (+10 back to chair.)  Gait: Assistance Level: Standby Assist;Management of Lines  Gait: Assistive Device: Nurse, adult  Gait: Descriptors: Decreased foot clearance RLE;Decreased foot clearance LLE;Forward trunk flexion;Swing-Through Gait;No balance loss  Comments: Patient with ongoing cough at beginning of session and asked for mobilizing in room this date.  Activity Limited By: Complaint of Fatigue       Education  Persons Educated: Patient  Patient Barriers To Learning: None Noted  Teaching Methods: Verbal Instruction  Patient Response: Verbalized Understanding  Topics: Plan/Goals of PT Interventions;Use of Assistive Device/Orthosis;Mobility Progression;Importance of Increasing Activity;Continue Ambulation on Own;Ambulate With Nursing;Recommend Continued Therapy;Therapy Schedule    Assessment/Progress  Impaired Mobility Due To: Decreased Activity Tolerance;Medical Status Limitation  Assessment/Progress: Should Improve w/ Continued PT  Patient tolerated session with mobility  limited by activity tolerance and ongoing cough impacting prolonged tolerance with functional tasks. Anticipate as medical status resolves, mobility will improve. Patient would benefit from ongoing skilled PT.     AM-PAC 6 Clicks Basic Mobility Inpatient  Turning from your back to your side while in a flat bed without using bed rails: None  Moving from lying on your back to sitting on the side of a flat bed without using bedrails : None  Moving to and from a bed to a chair (including a wheelchair): A Little  Standing up from a chair using your arms (e.g. wheelchair, or bedside chair): A Little  To walk in hospital room: A Little  Climbing 3-5 steps with a railing: A Little  Basic Mobility Inpatient Raw Score: 20  Standardized (T-scale) Score: 43.99  AM-PAC Basic Mobility Functional Stage: 34-51 Limited Mobility Indoors  Functional Stages - Basic Mobility Score Interpretation  34-51 Limited Mobility Indoors: Your score suggests significant difficulty in moving about independently and the need for assistance.  You may be able to move about in a small area of your home that has been adapted to eliminate safety hazards.  You may have difficulty moving from a sitting to standing position, climbing stairs and you may have a great deal of difficulty moving about outdoors and in the community.      Goals  Goal Formulation: With Patient  Time For Goal Achievement: 4 days  Patient Will Go Supine To/From Sit: Independently  Patient Will Transfer Sit to Stand: Independently  Patient Will Ambulate: Greater than 200 Feet, w/ Dan Humphreys, w/ Stand By Assist  Patient Will Go Up / Down Stairs: 1-2 Stairs, w/ Minimal Assist    Plan  Treatment Interventions: Mobility training;Strengthening;Balance activities;Endurance training;Neuromuscular reeducation;Rehabilitation technician to assist with ongoing mobilization, ADLs and/or exercise per instructions of licensed therapy staff  Plan Frequency: 3-5 Days per Week  PT Plan for Next Visit: increased gait w/ RW, stairs (2), standing ADLs for endurance    PT Discharge Recommendations  Recommendation: Home with consistent supervision/assistance  Recommendation for Therapy Post Discharge: Home health  Patient Currently Requires Physical Assist With: In and out of house;Stairs;Bathing  Patient Currently Requires Equipment: Owns what is needed      Therapist: Lisbeth Ply  Date: 05/05/2023

## 2023-05-06 ENCOUNTER — Encounter: Admit: 2023-05-06 | Discharge: 2023-05-06 | Payer: MEDICARE

## 2023-05-06 DIAGNOSIS — Z955 Presence of coronary angioplasty implant and graft: Secondary | ICD-10-CM

## 2023-05-06 DIAGNOSIS — C9000 Multiple myeloma not having achieved remission: Secondary | ICD-10-CM

## 2023-05-06 DIAGNOSIS — I130 Hypertensive heart and chronic kidney disease with heart failure and stage 1 through stage 4 chronic kidney disease, or unspecified chronic kidney disease: Secondary | ICD-10-CM

## 2023-05-06 DIAGNOSIS — J4489 Other specified chronic obstructive pulmonary disease (HCC): Secondary | ICD-10-CM

## 2023-05-06 DIAGNOSIS — E1122 Type 2 diabetes mellitus with diabetic chronic kidney disease: Secondary | ICD-10-CM

## 2023-05-06 DIAGNOSIS — Z823 Family history of stroke: Secondary | ICD-10-CM

## 2023-05-06 DIAGNOSIS — Z7901 Long term (current) use of anticoagulants: Secondary | ICD-10-CM

## 2023-05-06 DIAGNOSIS — I255 Ischemic cardiomyopathy: Secondary | ICD-10-CM

## 2023-05-06 DIAGNOSIS — Z951 Presence of aortocoronary bypass graft: Secondary | ICD-10-CM

## 2023-05-06 DIAGNOSIS — Z8542 Personal history of malignant neoplasm of other parts of uterus: Secondary | ICD-10-CM

## 2023-05-06 DIAGNOSIS — I5043 Acute on chronic combined systolic (congestive) and diastolic (congestive) heart failure: Secondary | ICD-10-CM

## 2023-05-06 DIAGNOSIS — Z9049 Acquired absence of other specified parts of digestive tract: Secondary | ICD-10-CM

## 2023-05-06 DIAGNOSIS — I493 Ventricular premature depolarization: Secondary | ICD-10-CM

## 2023-05-06 DIAGNOSIS — G4733 Obstructive sleep apnea (adult) (pediatric): Secondary | ICD-10-CM

## 2023-05-06 DIAGNOSIS — Z95810 Presence of automatic (implantable) cardiac defibrillator: Secondary | ICD-10-CM

## 2023-05-06 DIAGNOSIS — I4819 Other persistent atrial fibrillation: Secondary | ICD-10-CM

## 2023-05-06 DIAGNOSIS — Z1152 Encounter for screening for COVID-19: Secondary | ICD-10-CM

## 2023-05-06 DIAGNOSIS — E785 Hyperlipidemia, unspecified: Secondary | ICD-10-CM

## 2023-05-06 DIAGNOSIS — Z833 Family history of diabetes mellitus: Secondary | ICD-10-CM

## 2023-05-06 DIAGNOSIS — D500 Iron deficiency anemia secondary to blood loss (chronic): Secondary | ICD-10-CM

## 2023-05-06 DIAGNOSIS — Z87891 Personal history of nicotine dependence: Secondary | ICD-10-CM

## 2023-05-06 DIAGNOSIS — I340 Nonrheumatic mitral (valve) insufficiency: Secondary | ICD-10-CM

## 2023-05-06 DIAGNOSIS — K648 Other hemorrhoids: Secondary | ICD-10-CM

## 2023-05-06 DIAGNOSIS — B974 Respiratory syncytial virus as the cause of diseases classified elsewhere: Secondary | ICD-10-CM

## 2023-05-06 DIAGNOSIS — I5084 End stage heart failure: Secondary | ICD-10-CM

## 2023-05-06 DIAGNOSIS — I252 Old myocardial infarction: Secondary | ICD-10-CM

## 2023-05-06 DIAGNOSIS — Z79899 Other long term (current) drug therapy: Secondary | ICD-10-CM

## 2023-05-06 DIAGNOSIS — Z90710 Acquired absence of both cervix and uterus: Secondary | ICD-10-CM

## 2023-05-06 DIAGNOSIS — I081 Rheumatic disorders of both mitral and tricuspid valves: Secondary | ICD-10-CM

## 2023-05-06 DIAGNOSIS — I361 Nonrheumatic tricuspid (valve) insufficiency: Secondary | ICD-10-CM

## 2023-05-06 DIAGNOSIS — N184 Chronic kidney disease, stage 4 (severe): Secondary | ICD-10-CM

## 2023-05-06 DIAGNOSIS — K862 Cyst of pancreas: Secondary | ICD-10-CM

## 2023-05-06 DIAGNOSIS — Z8249 Family history of ischemic heart disease and other diseases of the circulatory system: Secondary | ICD-10-CM

## 2023-05-06 DIAGNOSIS — D630 Anemia in neoplastic disease: Secondary | ICD-10-CM

## 2023-05-06 DIAGNOSIS — I2510 Atherosclerotic heart disease of native coronary artery without angina pectoris: Secondary | ICD-10-CM

## 2023-05-06 DIAGNOSIS — K5730 Diverticulosis of large intestine without perforation or abscess without bleeding: Secondary | ICD-10-CM

## 2023-05-06 DIAGNOSIS — Z7951 Long term (current) use of inhaled steroids: Secondary | ICD-10-CM

## 2023-05-06 DIAGNOSIS — I440 Atrioventricular block, first degree: Secondary | ICD-10-CM

## 2023-05-06 DIAGNOSIS — K644 Residual hemorrhoidal skin tags: Secondary | ICD-10-CM

## 2023-05-06 DIAGNOSIS — Z888 Allergy status to other drugs, medicaments and biological substances status: Secondary | ICD-10-CM

## 2023-05-06 DIAGNOSIS — K921 Melena: Secondary | ICD-10-CM

## 2023-05-06 DIAGNOSIS — Z736 Limitation of activities due to disability: Secondary | ICD-10-CM

## 2023-05-06 LAB — POC GLUCOSE
~~LOC~~ BKR POC GLUCOSE: 153 mg/dL — ABNORMAL HIGH (ref 70–100)
~~LOC~~ BKR POC GLUCOSE: 131 mg/dL — ABNORMAL HIGH (ref 70–100)

## 2023-05-06 LAB — NT-PRO-BNP: ~~LOC~~ BKR NT-PRO-BNP: 113 pg/mL — ABNORMAL HIGH (ref <450)

## 2023-05-06 MED ORDER — MILRINONE IN 5 % DEXTROSE 20 MG/100 ML (200 MCG/ML) IV PGBK
.3 ug/kg/min | INTRAVENOUS | 3 refills | Status: DC
Start: 2023-05-06 — End: 2023-05-06

## 2023-05-06 MED ORDER — TORSEMIDE 20 MG PO TAB
40 mg | ORAL_TABLET | Freq: Two times a day (BID) | ORAL | 0 refills | Status: CN
Start: 2023-05-06 — End: ?

## 2023-05-06 MED ORDER — TORSEMIDE 20 MG PO TAB
40 mg | ORAL_TABLET | Freq: Two times a day (BID) | ORAL | 3 refills | 67.50000 days | Status: DC
Start: 2023-05-06 — End: 2023-05-06
  Filled 2023-05-06: 30d supply

## 2023-05-06 MED ORDER — POTASSIUM CHLORIDE 20 MEQ PO TBTQ
40 meq | Freq: Once | ORAL | 0 refills | Status: CP
Start: 2023-05-06 — End: ?
  Administered 2023-05-06: 15:00:00 40 meq via ORAL

## 2023-05-06 MED ORDER — POTASSIUM CHLORIDE 20 MEQ PO TBTQ
40 meq | ORAL_TABLET | Freq: Two times a day (BID) | ORAL | 2 refills | 30.00000 days | Status: DC
Start: 2023-05-06 — End: 2023-05-06

## 2023-05-06 MED ORDER — MILRINONE IN 5 % DEXTROSE 20 MG/100 ML (200 MCG/ML) IV PGBK
INTRAVENOUS | 0 refills | Status: DC
Start: 2023-05-06 — End: 2023-05-06

## 2023-05-06 MED ORDER — MILRINONE IN 5 % DEXTROSE 20 MG/100 ML (200 MCG/ML) IV PGBK
0 refills | Status: SS
Start: 2023-05-06 — End: ?

## 2023-05-06 MED ORDER — POTASSIUM CHLORIDE 20 MEQ PO TBTQ
40 meq | ORAL_TABLET | Freq: Two times a day (BID) | ORAL | 2 refills | Status: SS
Start: 2023-05-06 — End: ?

## 2023-05-06 MED ORDER — POTASSIUM CHLORIDE 20 MEQ PO TBTQ
40 meq | Freq: Every day | ORAL | 0 refills | Status: DC
Start: 2023-05-06 — End: 2023-05-06
  Administered 2023-05-06: 16:00:00 40 meq via ORAL

## 2023-05-06 MED ORDER — MAGNESIUM OXIDE 400 MG (241.3 MG MAGNESIUM) PO TAB
400 mg | ORAL_TABLET | Freq: Every evening | ORAL | 3 refills | Status: CN
Start: 2023-05-06 — End: ?

## 2023-05-06 MED ORDER — POTASSIUM CHLORIDE 20 MEQ PO TBTQ
40 meq | ORAL_TABLET | Freq: Two times a day (BID) | ORAL | 3 refills | 30.00000 days | Status: DC
Start: 2023-05-06 — End: 2023-05-06

## 2023-05-06 NOTE — Progress Notes
[facility] END OF SHIFT/PLAN OF CARE NURSING NOTE   Nursing Shift: Day Shift 0700-1900    Acute events, nursing interventions, & communication with providers:  Has been up to bathroom and amb in room today. Gait steady with walker. Plan for discharge tomorrow.  Milrinone gtt conts.      Patient Goal(s)  Patient will Report breathing has improved by the end of next shift.        Patient will  Verbalize readiness for discharge by discharge.   Admission Weight: Weight: 76.2 kg (168 lb)    Last 3 Weights:   Vitals:    05/03/23 1048 05/04/23 0931 05/05/23 0401   Weight: 74.8 kg (164 lb 12.8 oz) 74.5 kg (164 lb 3.2 oz) 73.3 kg (161 lb 9.6 oz)     Weight Change: Weight trend decreasing    Intake/Output Summary (Last 24 hours) at 05/05/2023 1833  Last data filed at 05/05/2023 1832  Gross per 24 hour   Intake 976.3 ml   Output 1900 ml   Net -923.7 ml     Last Bowel Movement Date: 05/05/23    Fluid Restriction? No   Quality/Safety    Total Fall Risk Score: 10   Risk for Injury related to falls: Standard risk for injury based on the ABCS scoring tool  Fall Risk Category:   History of More Than One Fall Within 6 Months Before Admission: No  Elimination, Bowel and Urine: N/A  Interventions: N/A - Does not score for risk in this category  Medications: On 2 or more high fall risk drugs  Interventions: Stay within arm's reach during toileting/showering (i.e., dizziness, orthostasis)  and Educate patient on medication side effects  Patient Care Equipment: One present  Interventions: Needs assistance with patient care equipment when ambulating  Mobility: 2 - Assistance required  Interventions: Assist x1 and Utilize walker, cane, or additional walking aid for ambulation  Cognition: 0 - No cognition issues  Interventions: Stay within arm's reach while patient ambulating/toileting/showering    Other safety precautions in place: HAPI Prevention Bundle, CLABSI Prevention Bundle, and Isolation Precautions    Restraints:  No      Patient Education  This RN provided education to Patient today. The following education topics were reviewed:  Quality/Safety Education:   CLABSI prevention and Fall risk  Medication Education:   Cardiac medication(s)  Education provided on the following medication(s):  milirone, eliquis.  Cardiac - Specific Education:   Heart failure management daily weight, I&O  General Education:   Diet/nutrition heart healthy diet. Milirone gtt at home    The following teaching method(s) were used: Verbal  Response to learning: [employer] Understanding  Needs reinforcement on:  discharge plans

## 2023-05-06 NOTE — Progress Notes
Heart Failure Progress Note    NAME:Raven [name]             MRN: [id number]                 DOB:February 21, 1946          AGE: 78 y.o.  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 She follows with Dr. Herma [name]. Raven [name] and Raven Abraham, APRN. Marland Kitchen    She presented to the ED on 04/27/23 reporting rectal bleeding, had recently had dark jellylike stool, abdominal discomfort, fatigue, nausea, had recently been diagnosed with RSV therefore was also having coughing symptoms.  Labs revealed stable H&H of 9.5/30.5, NT proBNP 12,625, high-sensitivity troponin levels 33-39-40.  Chest x-ray suggestive of pulmonary edema.  Unfortunately Ms. Raven [name] has had 4 hospitalizations over the last month as outlined below; this is her 8th admission over the last 9 months.     Admitted from 11/27-12/4/24 with melena and anemia, received PRBCs and underwent EGD on 12/2 which revealed 1.5 cm pedunculated viable polyp in duodenum which was clipped, Aspirin and Plavix were discontinued and she remained on Eliquis for anticoagulation.  Admitted from 12/6-12/13/24 with hematemesis, melena, symptomatic anemia.  Although her Plavix and aspirin had been discontinued during prior admission, pt reported she continued taking both aspirin and Plavix along with her Eliquis.  She required x4 units of PRBCs. Repeat EGD on 12/8 showed ulcer in duodenum and visible vessel with no active bleeding which was injected with epinephrine and Endo Clip placed.  Admitted from 12/15-12/19/24 for decompensated heart failure, required IV diuresis. It was noted that her milrinone infusion had been off for unknown amount of time.  For ongoing anemia she required x1 unit of PRBCs.  Admitted from 12/23-12/27/24 after she presented to HF clinic reporting symptoms of shortness of breath, concerns of hypervolemia and ~10 lbs weight gain.  She received IV diuresis, had developed a cough which was later identified as RSV when she was seen by her local ED.    Recommendations 05/06/23:  Chronic cardiogenic shock: continue home milrinone infusion at 0.3 mcg/kg/min  Diuretics: continue Torsemide  40 mg po bid at discharge  K-dur 40 mEq x 2 on 1/9 AM given potassium 3.1. Recommend discharge w/ KCl 40 meq BID PO.  Continue Mg oxide 400 mg nightly.  1.9 today.   HFrEF GDMT: limited with CKD and hx cardiogenic shock. Toprol XL stopped during hospitalization 12/23-12/27. Will not resume at this time. Previously on [name] which was recently dc'd, defer resumption at this time with reduced creatinine clearance.  NT proBNP repeated 1/9.  Paroxsymal AFib: continue Eliquis 5 mg twice daily. Pt denies any s/s of bleeding since resuming.  Would recommend holding Eliquis 48 hours prior to EGD/EUS which is now to take place as an outpatient.  Continue PTA Amiodarone 200 mg daily. Messaged Watchman navigator 04/29/23 to schedule appointment to discuss Watchman outpatient. [id number] but re-current admissions with GI bleed concerns requiring PRBCs and some interventions (none this stay); certainly risk benefit conversation.   CAD: continue PTA Rosuvastatin 10 mg daily  Hx GIB: H/H remains stable; monitor closely for signs of bleeding. GI following. Colonoscopy 1/3 revealed no acute findings. Plan for EGD with EUS as an outpatient.    HF team will sign off at this time.  Please reach out with any additional questions or concerns.  Patient scheduled for follow-up in the heart failure clinic with Raven Abraham, NP, on 1/16.  She will have a BMP and NT proBNP at this visit.  2000mg  sodium dietary restriction.  Fluid Restriction: 2 L  Daily standing scale weight. Goal Dry Weight: unknown.  ~162-163 lbs.    Pt seen and discussed with Dr. Glean [name].  Plan communicated to primary team.      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   She presents with signs of hypervolemia with Left  ventricular failure with signs of low flow state.    BNP Labs:   Lab Results   Component Value Date    NTPROBNP 12,625 (H) 04/27/2023    NTPROBNP 11,636 (H) 04/19/2023    NTPROBNP 11,324 (H) 04/12/2023      High Sensitivity Troponin:  Lab Results   Component Value Date    HSTROP0HR 33 (H) 04/27/2023    HSTROP2HR 39 (H) 04/27/2023    HSTROP4HR 40 (H) 04/27/2023    HSTROPDELT4 1 04/27/2023    HSTROPDELTA -2 04/19/2023    HIGHSTROPI 28 (H) 04/12/2023     Admission Weight: 76.2 kg (168 lb)        Most recent weights (inpatient):   Vitals:    05/04/23 0931 05/05/23 0401 05/06/23 0430   Weight: 74.5 kg (164 lb 3.2 oz) 73.3 kg (161 lb 9.6 oz) 74.2 kg (163 lb 9.6 oz)        I/O: Net - 0.6 L : 24 hours                Net -1.36 L :Stay /last 2 weeks                  Urine 24 hours: 1.6 L  PLAN:  Diuretic Therapy    Prior to admission dose Torsemide 20 mg BID + Kdur 20 mEq BID      Given on admission 12/31: IV Lasix 60 mg x 1      Daily Dosing 1/1-1/2: Bumex 2 mg IV BID  1/3: bumex 2mg  IV BID  1/4 increase to bumex 3mg  IV BID  1/5:Bumex 3 mg IV bid + 5 mg metolazone as well    1/6: Bumex 3 IV a.m. and torsemide 40 mg po bid started p.m.  1/7: Torsemide 40 mg po bid + 5 mg metolazone  1/8: Torsemide held a.m. and resumed 40 mg PO BID starting in PM  1/9: Torsemide 40 mg PO BID     *Of note, she dropped > 3 lbs with dose of 5 mg metolazone so this will be good to utilize in outpatient setting-may need to schedule once weekly if weight up at 1 week f/u.  168 lbs>164.8 lbs today 1/6.     Guideline Directed Medical Therapy PTA Inpatient Changes   ACEI/ARB N/A-CKD IV    ARNI N/A-CKD IV    BB N/A-cardiogenic shock on home inotrope; previously tolerated Toprol XL which was discontinued during most recent hospitalization.  Will not resume at this time   SGLT-2 Inhibitor N/A- recently on [name] 25 mg/day which was dc'd during 12/15-12/19 admission Defer resumption with CrCl <20 mL/min   Mineralocorticoid Receptor Antagonist N/A-CKD IV    Hydralazine/Nitrate N/A, BP well controlled    Ivabradine N/A, cardiogenic shock    Heart Rhythm Management Therapy Yes (ICD) 04/12/23 interrogation stable, no recent events   Anticoagulation for Afib/flutter apixaban (Eliquis) 5 mg BID Held on admission with concern for GIB  1/8: Eliquis resumed.  Recommend holding 48 hours prior to GI procedure as an outpatient.   Cardiac Rehab Evaluation for LVEF < or equal to 40% Yes; Date ordered: 04/12/23    7-Day post hospital follow up scheduled within 24-48 hours of discharge No Primary team to schedule a follow up using [facility]-Discharge [facility] Appointment Request Order Set for Cardiology: Heart Failure appt request within 24-48 hours of discharge      Inotrope Therapy Dose & Adjustments  (document symptom and hemodynamic response if pertinent)   Milrinone 0.3 mcg/kg/min Started during hospitalization May 2024     Testing/Procedures   04/12/23 Echo:  Eccentric hypertrophy of the left ventricle with severe dilatation of the left ventricular chamber  Left ventricular systolic function is moderately to severely impaired with an estimate ejection fraction of 30% and global hypokinesis  Right ventricle is moderately dilated with normal function  The left atrium is severely dilated  Pacemaker lead seen in the right atrium and right ventricle  Severe mitral valve insufficiency  Severe tricuspid valve insufficiency  No pericardial effusion  Severe pulmonary hypertension with estimated peak systolic pulmonary artery pressure of 73 mmHg    RHC 09/18/22 (weight 165 pounds)  RA 13  RV 70/12  PA 72/27 with mean 47  PCWP 17  TPG 30  PVR 12  CO/CI 2.5/1.4 by thermo  CO/CI 1.9/1.1 by Fick      Heart Failure Remote Monitoring and/or Heart Failure Research Candidacy   Patient is not an appropriate candidate for any remote monitoring or heart failure research studies due to  end stage HF .    CAD s/p CABG x3 (2010)  - 12/31 admit troponin 33>39>40, EKG without acute ischemic changes   > cont statin therapy; aspirin and Plavix recently dc'd with  GIB    S/p ICD  - device evaluation 04/12/23 AP <1%/VP <1%  - ICD interrogation 04/29/23: AP <1%/VP <1% with presenting rhythm AS-VS 90s with elevated HF diagnostics    HLD  Lab Results   Component Value Date    CHOL 107 09/16/2022    TRIG 72 09/16/2022    HDL 50 09/16/2022    LDL 45 09/16/2022    VLDL 14 09/16/2022    NONHDLCHOL 57 09/16/2022   > cont PTA Rosuvastatin 10 mg/day    Paroxysmal AFib  - PTA anticoagulated with Eliquis 5 mg BID held on admission  - cont PTA Amiodarone 200 mg/day  - recommend evaluation for Watchman in outpatient setting   > 1/8: Eliquis resumed.  Recommend holding 48 hours prior to EGD with EUS that is now scheduled to take place as an outpatient.    CKD stage IV  - 12/31 admit creat 2.36  - baseline creat ranges 2.4-2.6  Lab Results   Component Value Date    CR 2.70 (H) 05/06/2023    CR 2.72 (H) 05/05/2023    CR 2.43 (H) 05/04/2023   > avoid nephrotoxins, daily labs to follow    Chronic Anemia  Frequent GIB  Pancreatic cystic mass  - 12/31 admit H/H 9.5/30.5  Lab Results   Component Value Date    HGB 8.3 (L) 05/06/2023    HGB 9.0 (L) 05/05/2023    HGB 8.2 (L) 05/04/2023     Lab Results   Component Value Date    IRON 25 (L) 04/28/2023    PSAT 17 (L) 04/28/2023    TIBC 148 (L) 04/28/2023    FERRITIN 950 (H) 04/28/2023    > s/p IV Venofer 300 mg x1 on 03/31/23  - 04/30/23: Colonoscopy: no active bleeding or blood->suspect prior bleeding may have been hemorrhoidal vs diverticular based on findings  > Plan for EGD with EUS as an outpatient in coming weeks to evaluate known large lobulated pancreatic cystic mass    Hx DM 2  Lab Results   Component Value Date    HGBA1C 6.3 (H) 04/20/2023    HGBA1C 8.0 (H) 09/16/2022    HGBA1C 7.4 (H) 07/14/2022    - no PTA meds; previously on [name] 25 mg daily, this was discontinued during recent admission   > managed per primary team     RSV  - supportive care per primary team    Multiple myeloma  Follows at [facility], was seen during this admission by Dr. Mitchell Heir.    She had a repeat CBC done at [facility] On 11/21 that shows gradual downtrending, hemoglobin was 8.8, hematocrit 27.2.  -She follows with  [facility] oncology she was given Darzalex subcutaneous as well as Retacrit.       _____________________________________________________________________________  Subjective:   She currently complains of  productive cough and peripheral edema. She reports the edema is improved again today. She is feeling ready to go home.     She currently denies paroxysmal nocturnal dyspnea, orthopnea, lightheadedness, positional lightheadedness, palpitations, chest pain, and abdominal fullness.        Objective:                       Vital Signs:  Last Filed                Vital Signs: 24 Hour Range   BP: 114/66 (01/09 0431)  Temp: 36.2 ?C (97.2 ?F) (01/09 4540)  Pulse: 101 (01/09 0431)  Respirations: 18 PER MINUTE (01/09 0431)  SpO2: 98 % (01/09 0431)  O2%: 21 % (01/08 2134)  O2 Device: CPAP/BiPAP (01/09 0431)  BP: (95-114)/(51-66)   Temp:  [36.2 ?C (97.2 ?F)-37 ?C (98.6 ?F)]   Pulse:  [93-102]   Respirations:  [18 PER MINUTE]   SpO2:  [94 %-98 %]   O2%:  [21 %]   O2 Device: CPAP/BiPAP           Wt Readings from Last 10 Encounters:   05/06/23 74.2 kg (163 lb 9.6 oz)   04/23/23 76 kg (167 lb 9.6 oz)   04/15/23 77.6 kg (171 lb)   04/06/23 75.9 kg (167 lb 5.3 oz)   03/26/23 74.2 kg (163 lb 9.3 oz)   03/19/23 77 kg (169 lb 12.8 oz)   02/16/23 75.3 kg (166 lb)   02/15/23 75.3 kg (166 lb)   01/04/23 75.2 kg (165 lb 12.8 oz)   12/14/22 74.6 kg (164 lb 6.4 oz)     Physical Exam:    General Appearance: no distress  Neck Veins: JVP 10 cm at 90 degrees, HJR positive sitting upright in chair, (she does have severe TR)  Respiratory: coarse t/o, productive thick cough  Cardiac Auscultation: Regular rhythm, S1, S2, no S3 or S4, grade 2/6 murmur  Lower Extremity Edema: trace-1 +bilateral LE to mid tibia (puffy) - improved from 1/8  Abdominal Exam: soft, less distended, non-tender, bowel sounds normal  Orientation: clear historian, good insight    Laboratory Review:   CBC w/Diff    Lab Results   Component Value Date/Time    WBC 9.60 05/06/2023 04:22 AM    RBC 2.96 (L) 05/06/2023 04:22 AM    HGB 8.3 (L) 05/06/2023 04:22 AM    HCT 26.3 (L) 05/06/2023 04:22 AM    MCV 88.6 05/06/2023 04:22 AM    MCH 27.9 05/06/2023 04:22 AM    MCHC 31.4 (L) 05/06/2023 04:22 AM    RDW 23.9 (H) 05/06/2023 04:22 AM    PLTCT 278 05/06/2023 04:22 AM    MPV 8.3 05/06/2023 04:22 AM    Lab Results   Component Value Date/Time    NEUT 68 05/05/2023 03:47 AM    ANC 7.50 (H) 05/05/2023 03:47 AM    LYMA 19 (L) 05/05/2023 03:47 AM    ALC 2.10 05/05/2023 03:47 AM    MONA 9 05/05/2023 03:47 AM    AMC 1.00 (H) 05/05/2023 03:47 AM    EOSA 3 05/05/2023 03:47 AM    AEC 0.30 05/05/2023 03:47 AM    BASA 1 05/05/2023 03:47 AM    ABC 0.10 05/05/2023 03:47 AM         Chemistry    Lab Results   Component Value Date/Time    NA 143 05/06/2023 04:22 AM    K 3.1 (L) 05/06/2023 04:22 AM    CL 103 05/06/2023 04:22 AM    CO2 30 05/06/2023 04:22 AM    GAP 10 05/06/2023 04:22 AM    BUN 39 (H) 05/06/2023 04:22 AM    CR 2.70 (H) 05/06/2023 04:22 AM    GLU 142 (H) 05/06/2023 04:22 AM    MG 1.9 05/06/2023 04:22 AM    Lab Results   Component Value Date/Time    CA 8.7 05/06/2023 04:22 AM    PO4 4.0 04/27/2023 06:29 PM    ALBUMIN 3.3 (L) 05/06/2023 04:22 AM    TOTPROT 5.5 (L) 05/06/2023 04:22 AM    ALKPHOS 91 05/06/2023 04:22 AM  AST 14 05/06/2023 04:22 AM    ALT 9 05/06/2023 04:22 AM    TOTBILI 0.8 05/06/2023 04:22 AM    GFR 18 (L) 05/06/2023 04:22 AM            Renal Function    Lab Results   Component Value Date/Time    NA 143 05/06/2023 04:22 AM    K 3.1 (L) 05/06/2023 04:22 AM    CL 103 05/06/2023 04:22 AM    CO2 30 05/06/2023 04:22 AM    GAP 10 05/06/2023 04:22 AM    BUN 39 (H) 05/06/2023 04:22 AM    BUN 36 (H) 05/05/2023 03:47 AM    BUN 36 (H) 05/04/2023 06:17 AM    Lab Results   Component Value Date/Time    CR 2.70 (H) 05/06/2023 04:22 AM    CR 2.72 (H) 05/05/2023 03:47 AM    CR 2.43 (H) 05/04/2023 06:17 AM    GLU 142 (H) 05/06/2023 04:22 AM    CA 8.7 05/06/2023 04:22 AM    PO4 4.0 04/27/2023 06:29 PM    ALBUMIN 3.3 (L) 05/06/2023 04:22 AM        Lipid Profile INR   Lab Results   Component Value Date    CHOL 107 09/16/2022    TRIG 72 09/16/2022    HDL 50 09/16/2022    LDL 45 09/16/2022    VLDL 14 09/16/2022    NONHDLCHOL 57 09/16/2022         Lab Results   Component Value Date    INR 1.4 (H) 05/04/2023          04/27/23 Chest X-Ray:   Similar cardiac silhouette enlargement with persistent perihilar and basal opacities and vascular indistinctness, favored to represent edema.      Tele:  SR-ST, PVCs, HR 80s-100s    04/27/23 ECG:   SR w/ 1st degree A/V block, occasional PVCs, HR 85 bpm, QTc 504

## 2023-05-06 NOTE — Progress Notes
[facility] END OF SHIFT/PLAN OF CARE NURSING NOTE   Nursing Shift: Night Shift 1900-0700    Acute events, nursing interventions, & communication with providers:   No acute events overnight. Patient got PRN tessalon perles, halls, and robitussin for cough.     Patient Goal(s)  Patient will Report breathing has improved by the end of next shift.        Patient will  Verbalize readiness for discharge by discharge.   Admission Weight: Weight: 76.2 kg (168 lb)    Last 3 Weights:   Vitals:    05/04/23 0931 05/05/23 0401 05/06/23 0430   Weight: 74.5 kg (164 lb 3.2 oz) 73.3 kg (161 lb 9.6 oz) 74.2 kg (163 lb 9.6 oz)     Weight Change: Weight trend increasing    Intake/Output Summary (Last 24 hours) at 05/06/2023 0744  Last data filed at 05/06/2023 0430  Gross per 24 hour   Intake 976.3 ml   Output 1600 ml   Net -623.7 ml     Last Bowel Movement Date: 05/05/23    Fluid Restriction? No   Quality/Safety    Total Fall Risk Score: 10   Risk for Injury related to falls: Standard risk for injury based on the ABCS scoring tool  Fall Risk Category:   History of More Than One Fall Within 6 Months Before Admission: No  Elimination, Bowel and Urine: N/A  Interventions: N/A - Does not score for risk in this category  Medications: On 2 or more high fall risk drugs  Interventions: Educate patient on medication side effects  Patient Care Equipment: One present  Interventions: Needs assistance with patient care equipment when ambulating  Mobility: 2 - Assistance required  Interventions: Assist x1  Cognition: 0 - No cognition issues  Interventions: N/A - Does not score as risk in this category    Other safety precautions in place: CLABSI Prevention Bundle    Restraints:  No      Patient Education  This RN provided education to Patient today. The following education topics were reviewed:  Quality/Safety Education:   CLABSI prevention and Pain scale  Medication Education:   Pharmacological pain intervention  Education provided on the following medication(s): robitussin  Cardiac - Specific Education:   Heart failure management  General Education:   Glucose management    The following teaching method(s) were used: Verbal  Response to learning: Verbalizes Understanding  Needs reinforcement on: Pt verbalizes understanding

## 2023-05-06 NOTE — Discharge Instructions - Pharmacy
Discharge Summary      Name: Raven [name]  Medical Record Number: [id number]        Account Number:  [id number]  Date Of Birth:  11-20-1945                         Age:  78 y.o.  Admit date:  04/27/2023                     Discharge date: 05/06/2023      Discharge Attending:  Miguel Aschoff, MD    Discharge Summary Completed By: Miguel Aschoff, MD    Service: Med Private D- [phone]    Reason for hospitalization:  GI bleed [K92.2]    Primary Discharge Diagnosis:   GI bleed    Hospital Diagnoses:  Hospital Problems        Active Problems    * (Principal) GI bleed    Multiple myeloma (HCC)    Receiving inotropic medication    Coronary artery disease involving native coronary artery of native heart   without angina pectoris    Type 2 diabetes mellitus without complications (HCC)    Chronic kidney disease, stage 4 (severe) (HCC)    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    Benign essential hypertension    Cardiomyopathy, ischemic    Paroxysmal A-fib (HCC)    ICD (implantable cardioverter-defibrillator) in place    Persistent atrial fibrillation (HCC)    Acute on chronic HFrEF (heart failure with reduced ejection fraction)   (HCC)     Present on Admission:   GI bleed   Multiple myeloma (HCC)   Receiving inotropic medication   Coronary artery disease involving native coronary artery of native heart without angina pectoris   Type 2 diabetes mellitus without complications (HCC)   Chronic kidney disease, stage 4 (severe) (HCC)   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   Cardiomyopathy, ischemic   Benign essential hypertension   Persistent atrial fibrillation (HCC)   ICD (implantable cardioverter-defibrillator) in place   Acute on chronic HFrEF (heart failure with reduced ejection fraction) (HCC)        Significant Past Medical History        Anemia  Anxiety  Arthritis  Asthma  Asymptomatic stenosis of right carotid artery  Atrial fibrillation (HCC)  Back pain  Cancer of uterus (HCC)  Chronic kidney disease  COPD (chronic obstructive pulmonary disease) (HCC)  Coronary artery disease  Diabetes mellitus (HCC)  Dyslipidemia  Flank mass  Fluid retention  GI bleed      Comment:  multiple  Heart disease  Heart failure (HCC)  Hypertension  Multiple myeloma (HCC)  OSA on CPAP  Other malignant neoplasm without specification of site  Vision decreased    Allergies   Bortezomib, Allopurinol, and Sulfa (sulfonamide antibiotics)    Brief Hospital Course   78 y.o.   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 Recurrent hospitalizations for HF exacerbation, GI bleeds.  Patient to the hospital with bleeding  per rectum.  Hemoglobin was 9.5 on admission.12/2 EGD: 1.5 cm friable polyp in duodenum s/p clip x2, no bleeding seen   -12/8 EGD with ulcer in duodenum and visible vessel, no active bleeding. Endo Clip placed.  She is hemodynamically stable.  GI team on board. Colonoscopy done on April 30, 2023.  No blood or dark activity on antigen.  Hemorrhoidal versus diverticular.  Retained endoscopically in ascending colon.  Patent end-to-end close anastomosis clear about visible suture and healthy-appearing was appear diverticulosis in sigmoid colon.  Nonbleeding external/internal hemorrhoids.  Otherwise normal.  Patient hemoglobin has been stable.  They were planning to do EUS with biopsy however it was canceled due to RSV.  They will schedule as an outpatient.  They are okay to continue Eliquis.  As her bleeding stopped and hemoglobin has been stable.  RSV is positive.  She was maintaining her saturation room air.  Cardiology team was consulted due to CHF exacerbation.  She received metolazone in between.  This started on the torsemide 40 mg daily.  Her condition improved with diuresis.  She is at her baseline weight.  She is stable to discharge home with follow-up with the cardiology team and GI team cardiology team recommended to continue torsemide 40 mg twice daily potassium 40 mill equivalent twice daily.  They recommended to cardiorenal.  She had the cardiology team is okay to resume Eliquis.  Patient is stable for discharge home with home health      Items Needing Follow Up   Pending items or areas that need to be addressed at follow up: card     Pending Labs and Follow Up Radiology    Pending labs and/or radiology review at this time of discharge are listed below: Please note- any labs with collected status will not have a result; if this area is blank, there are no items for review.   Pending Labs       Order Current Status    POC GLUCOSE In process    POC GLUCOSE In process    POC GLUCOSE In process              Medications      Medication List      START taking these medications     magnesium oxide 400 mg (241.3 mg magnesium) tablet; Commonly known as:   MAGOX; Dose: 400 mg; Take one tablet by mouth at bedtime daily.   Indications: low amount of magnesium in the blood; For: low amount of   magnesium in the blood; Quantity: 180 tablet; Refills: 3     CHANGE how you take these medications     potassium chloride SR 20 mEq tablet; Commonly known as: K-DUR; Dose: 40   mEq; Doctor's comments: Fill when pt requests.; Take two tablets by mouth   twice daily. Indications: low amount of potassium in the blood; For: low   amount of potassium in the blood; Quantity: 120 tablet; Refills: 2; What   changed: how much to take   torsemide 20 mg tablet; Commonly known as: DEMADEX; Dose: 40 mg; Take   two tablets by mouth twice daily. Indications: accumulation of fluid   resulting from chronic heart failure; For: accumulation of fluid resulting   from chronic heart failure; Quantity: 120 tablet; Refills: 0; What   changed: how much to take     CONTINUE taking these medications     acetaminophen 325 mg tablet; Commonly known as: TYLENOL; Dose: 975 mg;   Take three tablets  by mouth every 8 hours as needed for Pain. Indications:   pain; For: pain; Quantity: 40 tablet; Refills: 0   acyclovir 400 mg tablet; Commonly known as: ZOVIRAX; Dose: 400 mg;   Refills: 0   albuterol sulfate 90 mcg/actuation HFA aerosol inhaler; Commonly known   as: PROAIR HFA; Dose: 2 puff; Refills: 0   amiodarone 200 mg tablet; Commonly known as: CORDARONE; Dose: 200 mg;   Take one tablet by mouth daily. Indications: prevention of recurrent   atrial fibrillation; For: prevention of recurrent atrial fibrillation;   Quantity: 90 tablet; Refills: 1   apixaban 5 mg tablet; Commonly known as: ELIQUIS; Dose: 5 mg; Take one   tablet by mouth twice daily. Indications: afib; For: afib; Quantity: 60   tablet; Refills: 0   budesonide-formoterol HFA 160-4.5 mcg/actuation aerosol inhaler;   Commonly known as: SYMBICORT; Dose: 2 puff; Inhale two puffs by mouth into   the lungs twice daily. Indications: bronchospasm prevention with COPD;   For: bronchospasm prevention with COPD; Quantity: 30.6 g; Refills: 0   dexAMETHasone 4 mg tablet; Commonly known as: DECADRON; Dose: 8 mg;   Refills: 0   dextromethorphan-guaiFENesin 10-100 mg/5 mL oral syrup; Commonly known   as: ROBITUSSIN-DM; Dose: 10 mL; Take 10 mL by mouth every 4 hours as   needed. Indications: cough; For: cough; Quantity: 120 mL; Refills: 0   EMU OIL (BULK) MISC; Refills: 0   febuxostat 40 mg tablet; Commonly known as: ULORIC; Dose: 20 mg; Take   one-half tablet by mouth daily. Indications: taken on chemo days; For:   taken on chemo days; Quantity: 45 tablet; Refills: 0   ferrous sulfate 325 mg (65 mg iron) tablet; Commonly known as: FEOSOL;   Dose: 325 mg; Refills: 0   milrinone 200 mcg/mL infusion; Commonly known as: PRIMACOR; 0.3   mcg/kg/min ? 78.3 kg continuous  Indications: sudden and serious symptoms   of heart failure called acute decompensated heart failure; For: sudden and   serious symptoms of heart failure called acute decompensated heart   failure; Refills: 0   nitroglycerin 0.4 mg tablet; Commonly known as: NITROSTAT; Dose: 0.4 mg;   Refills: 0   pantoprazole DR 40 mg tablet; Commonly known as: PROTONIX; Dose: 40 mg;   Take one tablet by mouth twice daily. Indications: gastritis; For:   gastritis; Quantity: 112 tablet; Refills: 0   rosuvastatin 20 mg tablet; Commonly known as: CRESTOR; Dose: 10 mg; Take   one-half tablet by mouth daily. Indications: excessive fat in the blood;   For: excessive fat in the blood; Quantity: 90 tablet; Refills: 0   sertraline 50 mg tablet; Commonly known as: ZOLOFT; Dose: 1 tablet;   Refills: 0   VITAMIN B-12 1,000 mcg tablet; Generic drug: cyanocobalamin (vitamin   B-12); Dose: 5,000 Units; Refills: 0   VITAMIN D3 1,000 units tablet; Generic drug: CHOLEcalciferoL (vitamin   D3); Dose: 1,000 Units; Refills: 0       Return Appointments and Scheduled Appointments     Scheduled appointments:      May 13, 2023 10:45 AM  Follow-up visit with Fredricka Bonine, APRN-NP  Cardiovascular Medicine: [facility] (CVM Exam) [address]  [place] [address]  [phone]     Sep 16, 2023 1:30 PM  Office visit with Arneta Cliche, MD  Cardiovascular Medicine: [facility] (CVM Exam) [address]  [address]  [place] [address]  [phone]     Sep 17, 2023  2:30 PM  ICD Check with CMPB3 PACEMAKER  Cardiovascular Medicine: [facility] [address] (CVM Procedural) [id number]  [address]  [place] [address]  [phone]     Sep 17, 2023 3:00 PM  Office visit with Smith Robert, MD  Cardiovascular Medicine: [facility], [address] (CVM Exam) [phone]  [address]  [place] [address]  [phone]          Contact information for after-discharge care                [facility]       [facility]    Phone: [phone]    Fax: [phone]    Where: [address], [place] [id number]    Service: Home Health Services              [facility]       *[facility]    Phone: [phone]    Fax: [phone]    Where: [address], [place] [address]    Service: Home Infusion and Injection                  Consults, Procedures, Diagnostics, Micro, Pathology   Consults: Cardiology and Hepatology  Surgical Procedures & Dates:  none   Significant Diagnostic Studies, Micro and Procedures: noted in brief hospital course  Significant Pathology: noted in brief hospital course                                   Discharge Disposition, Condition   Patient Disposition: Home Health Care Svc [06]  Condition at Discharge: Stable    Code Status   Full Code    Patient Instructions     Activity       Activity as Tolerated   As directed      It is important to keep increasing your activity level after you leave the hospital.  Moving around can help prevent blood clots, lung infection (pneumonia) and other problems.  Gradually increasing the number of times you are up moving around will help you return to your normal activity level more quickly.  Continue to increase the number of times you are up to the chair and walking daily to return to your normal activity level. Begin to work toward your normal activity level at discharge          Diet       Cardiac Diet   As directed      Limiting unhealthy fats and cholesterol is the most important step you can take in reducing your risk for cardiovascular disease.  Unhealthy fats include saturated and trans fats.  Monitor your sodium and cholesterol intake.  Restrict your sodium to 2g (grams) or 2000mg  (milligrams) daily, and your cholesterol to 200mg  daily.    If you have questions regarding your diet at home, you may contact a dietitian at [phone].             Discharge education provided to patient., Signs and Symptoms:   Report these signs and symptoms       Report These Signs and Symptoms   As directed      Please contact your doctor if you have any of the following symptoms: difficulty breathing or chest pain        ,  Education: , and Others Instructions:   Other Orders       Questions About Your Stay   Complete by: As directed      Discharging attending physician: Miguel Aschoff    Order comments: For questions or concerns regarding your hospital stay, call [phone].            Additional Orders: Case Management, Supplies, Home Health     Home Health/DME                HOME HEALTH/DME  ONCE        Comments: Home Health/Durable Medical Equipment Order Details    Patient Name:  Raven [name]                   Medical Record Number:   [id number]    Patient Diagnosis & ICD 10 Codes: GI bleed (K92.2), persistent A-fib (I48.0), Multiple myeloma (C90.00)  Patient Height: 157.5 cm (5' 2)   Patient Weight: 76.2 kg (168 lb)      Patient now stable for discharge. Please resume the following disciplines: SN, PT, OT     RN to complete general assessment, including vitals with temperature, monitor and teach patient and/or caregiver medication management and compliance, monitor and teach pain management and pain medication compliance when necessary. Monitor and teach signs and symptoms of infection, monitor and teach disease management, including signs and symptoms of diet, who and when to call, hospital readmission avoidance. Please see attached AVS for additional discharge instruction.   -Physical Therapy  to evaluate and treat along with home safety evaluation.  Teach strategies for energy conservation, mobility training, strengthening, balance activities and address deficits, maximize function and improve safety.   -Occupational Therapy   to evaluate and treat along with home safety evaluation.  Teach strategies for energy conservation, mobility training, strengthening, balance activities and address deficits, maximize function and improve safety.      Home Health to setup weekly pill box for patient.     KUHI to resume IV medication:      Drug: milrinone 0.7mcg/kg/min at 78.3kg IV via continuous infusion                     Administration method:  CADD [employer] ambulatory pump     Clinical findings to support homebound status: Extreme weakness and/or fatigue   Clinical findings to support home care services: Muscle weakness affecting functional activities    I certify that this patient is under my care and that I, or a nurse practitioner or physician's assistant working with me, had a face-to-face encounter that meets the physician's face-to-face encounter requirements with this patient on 05/02/2022. This patient is under my care, and I have initiated the establishment of the plan of care.  This patient will be followed by a physician after discharge, who will periodically review the plan of care.   Question Answer Comment   Attending Name/Contact Dr. Miguel Aschoff [facility] [phone]/ [phone] NPI [phone]   PCP Name/Contact Barbaraann Rondo, MD (Phone: [phone]; Fax: [phone])    Specialist Name/Contact Cardiologist to follow IV milrinone                             Signed:  Miguel Aschoff, MD  05/06/2023      cc:  Primary Care Physician:  Barbaraann Rondo   Verified    Referring physicians:  No ref. provider found   Additional  provider(s):        Did we miss something? If additional records are needed, please fax a request on office letterhead to [phone]. Please include the patient's name, date of birth, fax number and type of information needed. Additional request can be made by email at [email or web address]. For general questions of information about electronic records sharing, call [phone].

## 2023-05-06 NOTE — Progress Notes
Internal Medicine - Discharge Day Progress Note    Subjective:   No events overnight.  This morning, she feels back to her baseline.  She denies any fever chills, chest pain, dyspnea, nausea vomiting, leg swelling.     Objective:  Gen - resting in bed comfortably in NAD  CV - RRR  Lungs - CTAB, no wheezes  Abd - soft, non-tender, non-distended  Extremities: no LE edema  Neuro: AOx3     Lab Results   Component Value Date/Time    NA 143 05/06/2023 04:22 AM    K 3.1 (L) 05/06/2023 04:22 AM    CL 103 05/06/2023 04:22 AM    CO2 30 05/06/2023 04:22 AM    GAP 10 05/06/2023 04:22 AM     Lab Results   Component Value Date/Time    WBC 9.60 05/06/2023 04:22 AM    HGB 8.3 (L) 05/06/2023 04:22 AM    PLTCT 278 05/06/2023 04:22 AM       Assessment and plan:  78 y.o.   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     Of note, this is Ms. Fraga's eighth hospitalization at  this year. Recurrent hospitalizations for HF exacerbation, GI bleeds.      Hematochezia  -Patient endorses 1 day history of bright red blood per rectum  -Hemoglobin on admission 9.5 which appears to be around baseline  -12/2 EGD: 1.5 cm friable polyp in duodenum s/p clip x2, no bleeding seen   -12/8 EGD with ulcer in duodenum and visible vessel, no active bleeding. Endo Clip placed.  -Patient currently hemodynamically stable  -Hematochezia possibly secondary to diverticular bleed versus angiodysplasia versus hemorrhoidal bleed  Plan  -Consult GI. Colonoscopy done on April 30, 2023.  No blood or dark activity on antigen.  Hemorrhoidal versus diverticular.  Retained endoscopically in ascending colon.  Patent end-to-end close anastomosis clear about visible suture and healthy-appearing was appear diverticulosis in sigmoid colon.  Nonbleeding external/internal hemorrhoids.  Otherwise normal.  -H&H stable.  Target Hb is above 7.  -Obtain type and cross  -Hold Eliquis.  -GI team is on board.  Appreciate their help.   - EUS with biopsy again canceled today.  GI will schedule as an outpatient.  They signed off.  They are okay to discharge her.     Cough  RSV infection  -Patient endorses nonproductive cough and shortness of breath  -Tested positive for RSV in the ED  -Chest x-ray revealed similar cardiac silhouette enlargement with persistent perihilar and basilar opacities and vascular destructiveness favored to represent edema  Plan  -Continue with supportive therapy for now. Patient on room air   -Cough suppressants added     Acute on chronic combined systolic and diastolic heart failure  On chronic milrinone  Ischemic cardiomyopathy  CAD s/p CABG and PCI  CRT-D in situ  PAF  -Recent hospitalization 12/23-27 with HF exacerbation.   -Echo from 04/12/2023 revealed EF of 30% with grade 3 diastolic dysfunction and severe mitral and tricuspid valve regurgitation  -EKG revealed sinus rhythm with first-degree AV block and PVCs  -NT proBNP elevated  at 12K. Has been >10k at baseline. Most recently 11,636 04/19/23  -Chest x-ray revealed pulmonary edema  -Suspect shortness of breath likely has a component of heart failure exacerbation  -PTA on torsemide 20 mg BID  Plan  -HF following.  Appreciate their help.    -They recommended to continue torsemide 40 mg twice daily..  -Torsemide held today because of worsening creatinine.  Discussed with cardiology team they are okay with that.  - Continue potassium 40 mill equivalent daily.  Repeat labs in the morning.  - Magnesium oxide 400 mg nightly.  -20 mg potassium twice daily on discharge.  Appreciate heart failure team.  -Daily weight and I&O  -Resume PTA milrinone  -Resume PTA amiodarone for PAF  Discussed with the cardiology and GI team they are okay to resume Eliquis.  Discussed with cardiology and they are okay to discharge her home.  Currently she is at her baseline weight.  She will follow-up with the cardiology team in clinic.  NT proBNP ordered on day of discharge.  Her appointment is already scheduled for 03/12/2024.  They will repeat BMP and proBNP at that time.  They recommended to discharge her on torsemide 40 mg twice daily.  And KCl 40 mg twice daily.  -        Multiple myeloma  -Follows at [facility] and currently on Daratumumab  -Continue PTA acyclovir for prophylaxis  -Follow-up with hematology as outpatient     DM type II  -Most recent hemoglobin A1c from 04/20/2023 was 6.3  -Will start low-dose correction factor.  -Blood glucose under well control.     COPD  Pulmonary nodules  -Continue PTA albuterol and Symbicort  -Follow-up for pulmonary nodules recommended after 6 to 8 weeks     Pancreatic cyst  -CT abdomen/pelvis from 04/02/2023 revealed mild increase in size of indeterminant large lobulated cystic mass in the tail of the pancreas  -Endoscopic ultrasound recommended  -Follow-up with GI as outpatient  Patient feels comfortable with plans for discharge. All questions were answered. Discharge education including follow up/discharge instructions occurred with patient face-to-face.      Future Appointments   Date Time Provider Department Center   05/13/2023 10:45 AM Fredricka Bonine, APRN-NP [facility] CVM Exam   09/16/2023  1:30 PM Vanetta Shawl I, MD [facility] CVM Exam   09/17/2023  2:30 PM CMPB3 PACEMAKER [facility] CVM Procedur   09/17/2023  3:00 PM Emert, Catalina Pizza, MD [facility] CVM Exam     I spent >31 minutes in discharge planning/coordination of care, documentation, and patient/family counseling.       Miguel Aschoff, MD  Clinical Assistant Professor   Department of Internal Medicine  Service: Med Private

## 2023-05-07 ENCOUNTER — Ambulatory Visit: Admit: 2023-05-07 | Discharge: 2023-05-07 | Payer: MEDICARE

## 2023-05-07 ENCOUNTER — Encounter: Admit: 2023-05-07 | Discharge: 2023-05-07 | Payer: MEDICARE

## 2023-05-07 NOTE — Telephone Encounter
Patient Discharge Date from hospital: 05/06/23  Date Call Attempted: 05/07/23  Number of Attempts: 1  Date Call Completed:  05/07/23      Two Patient Identifier complete: Yes [x]     Next Appointment    Next follow-up appointment on 05/13/23 at 1045 with [name], APRN at our [facility].    Transportation    Does pt have transportation?  Yes [x]     No []    NA []      Home Health    [facility] Ph) [phone] Fax) [phone]   [facility] Ph) [phone] Fax) [phone]     Medications    Does pt have all medications? Yes  [x]     No []       START taking:  magnesium oxide (MAGOX)  CHANGE how you take:  potassium chloride SR (K-DUR)  torsemide (DEMADEX)    Diet    200 mg cholesterol, 2 G Na, 2 L fluid restriction     Is patient following prescribed diet and restrictions?  Yes [x]    No []      Scale/Weight    Does pt have a scale at home?  Yes [x]    No []     Did pt weight first thing this morning?  Yes [x]    No []      If yes, what was pt's first morning weight today?  160.0    Signs and Symptoms    Pt reports the following symptoms:     States she still has a little BLE edema but is better since her admission. No upper abdominal bloating/chest tightness. States her SOA is back to normal but she still has DOE.      PICC line dressing intact, without drainage, increased redness/swelling.    Pt verbalized understanding of signs and symptoms of HF and when to contact a provider or seek immediate assistance at the ER.    Was pt given zone sheet? Yes [x]   No []     Intervention(s)    Pt educated on the importance of weighing daily first thing in the morning before dressing, before eating or drinking, and after voiding using the same scale in the same location and write results down in note pad or log. Notify us for weight gains of 3 lbs in one day or 5 lbs in one week. Notify your provider for increased SOA.  Notify your provider for swelling or increased swelling in BLE or abdominal fullness/bloating. Advised to check B/P at least once daily. Check 1-2 hours after am meds. Log results. Check B/P other times if feeling lightheaded, dizzy, or if you feel your heart rate is elevated. Document the time you checked and any symptoms you may be feeling at the time. Call 911 for sudden, severe chest/pain pressure/SOA develops. Be sure to keep your follow up appointment and bring your weight logs, B/P logs, and medication list with you to your appointment. Call us at [phone] if you have any questions.    Plan of Care    Continued education needed for heart failure symptom management and when to contact our office.

## 2023-05-10 ENCOUNTER — Encounter: Admit: 2023-05-10 | Discharge: 2023-05-10 | Payer: MEDICARE

## 2023-05-10 ENCOUNTER — Inpatient Hospital Stay
Admit: 2023-05-10 | Discharge: 2023-05-15 | Disposition: A | Discharge status: Skilled Nursing Facility | Payer: MEDICARE

## 2023-05-10 ENCOUNTER — Emergency Department: Admit: 2023-05-10 | Discharge: 2023-05-10 | Payer: MEDICARE

## 2023-05-10 DIAGNOSIS — I4819 Other persistent atrial fibrillation: Secondary | ICD-10-CM

## 2023-05-10 DIAGNOSIS — Z95810 Presence of automatic (implantable) cardiac defibrillator: Secondary | ICD-10-CM

## 2023-05-10 DIAGNOSIS — Z79899 Other long term (current) drug therapy: Secondary | ICD-10-CM

## 2023-05-10 DIAGNOSIS — N179 Acute kidney failure, unspecified: Secondary | ICD-10-CM

## 2023-05-10 DIAGNOSIS — I480 Paroxysmal atrial fibrillation: Secondary | ICD-10-CM

## 2023-05-10 DIAGNOSIS — N390 Urinary tract infection, site not specified: Secondary | ICD-10-CM

## 2023-05-10 DIAGNOSIS — E1121 Type 2 diabetes mellitus with diabetic nephropathy: Secondary | ICD-10-CM

## 2023-05-10 DIAGNOSIS — K859 Acute pancreatitis without necrosis or infection, unspecified: Secondary | ICD-10-CM

## 2023-05-10 DIAGNOSIS — I5023 Acute on chronic systolic (congestive) heart failure: Secondary | ICD-10-CM

## 2023-05-10 DIAGNOSIS — R112 Nausea with vomiting, unspecified: Secondary | ICD-10-CM

## 2023-05-10 DIAGNOSIS — R0600 Dyspnea, unspecified: Secondary | ICD-10-CM

## 2023-05-10 DIAGNOSIS — I340 Nonrheumatic mitral (valve) insufficiency: Secondary | ICD-10-CM

## 2023-05-10 DIAGNOSIS — I071 Rheumatic tricuspid insufficiency: Secondary | ICD-10-CM

## 2023-05-10 DIAGNOSIS — R1110 Vomiting, unspecified: Secondary | ICD-10-CM

## 2023-05-10 DIAGNOSIS — I2510 Atherosclerotic heart disease of native coronary artery without angina pectoris: Secondary | ICD-10-CM

## 2023-05-10 DIAGNOSIS — B338 Other specified viral diseases: Secondary | ICD-10-CM

## 2023-05-10 LAB — CBC AND DIFF
~~LOC~~ BKR EOSINOPHILS %: 3 % (ref 0–5)
~~LOC~~ BKR HEMATOCRIT: 30 % — ABNORMAL LOW (ref 36.0–45.0)
~~LOC~~ BKR LYMPHOCYTES %: 18 % — ABNORMAL LOW (ref 24–44)
~~LOC~~ BKR MCH: 28 pg — ABNORMAL HIGH (ref 26.0–34.0)
~~LOC~~ BKR MCHC: 31 g/dL — ABNORMAL LOW (ref 32.0–36.0)
~~LOC~~ BKR MCV: 89 fL — ABNORMAL HIGH (ref 80.0–100.0)
~~LOC~~ BKR MONOCYTES %: 8 % — AB (ref 4–12)
~~LOC~~ BKR MPV: 9.2 fL — AB (ref 7.0–11.0)
~~LOC~~ BKR PLATELET COUNT: 288 10*3/uL — AB (ref 150–400)
~~LOC~~ BKR RBC COUNT: 3.3 10*6/uL — ABNORMAL LOW (ref 4.00–5.00)
~~LOC~~ BKR RDW: 24 % — ABNORMAL HIGH (ref 11.0–15.0)
~~LOC~~ BKR WBC COUNT: 10 10*3/uL (ref 4.50–11.00)

## 2023-05-10 LAB — HIGH SENSITIVITY TROPONIN I 4 HR
~~LOC~~ BKR HI SEN TNI DELTA 4-2: -1
~~LOC~~ BKR HIGH SENSITIVITY TROPONIN I 4 HOUR: 39 ng/L — ABNORMAL HIGH (ref <15)

## 2023-05-10 LAB — KC ED MAIN ECG TRIAGE ONLY
P AXIS: 58 degrees
P-R INTERVAL: 232 ms
Q-T INTERVAL: 436 ms
QRS DURATION: 118 ms
QTC CALCULATION (BAZETT): 515 ms
R AXIS: -37 degrees
T AXIS: 69 degrees
VENTRICULAR RATE: 84 {beats}/min

## 2023-05-10 LAB — POC GLUCOSE: ~~LOC~~ BKR POC GLUCOSE: 160 mg/dL — ABNORMAL HIGH (ref 70–100)

## 2023-05-10 LAB — HIGH SENSITIVITY TROPONIN I 2 HOUR
~~LOC~~ BKR HIGH SENSITIVITY TROPONIN I 2 HOUR: 40 ng/L — ABNORMAL HIGH (ref <15)
~~LOC~~ BKR HIGH SENSITIVITY TROPONIN I DELTA VALUE: -9

## 2023-05-10 LAB — URINALYSIS MICROSCOPIC REFLEX TO CULTURE

## 2023-05-10 LAB — MAGNESIUM: ~~LOC~~ BKR MAGNESIUM: 1.7 mg/dL (ref 1.6–2.6)

## 2023-05-10 LAB — HIGH SENSITIVITY TROPONIN I 0 HOUR: ~~LOC~~ BKR HIGH SENSITIVITY TROPONIN I 0 HOUR: 49 ng/L — ABNORMAL HIGH (ref <15)

## 2023-05-10 LAB — NT-PRO-BNP: ~~LOC~~ BKR NT-PRO-BNP: 150 pg/mL — ABNORMAL HIGH (ref 3.5–<450)

## 2023-05-10 LAB — LIPASE: ~~LOC~~ BKR LIPASE: 17 U/L (ref 11–82)

## 2023-05-10 MED ORDER — BUDESONIDE-FORMOTEROL 160-4.5 MCG/ACTUATION IN HFAA
2 | Freq: Two times a day (BID) | RESPIRATORY_TRACT | 0 refills | Status: DC
Start: 2023-05-10 — End: 2023-05-15
  Administered 2023-05-11 – 2023-05-14 (×3): 2 via RESPIRATORY_TRACT

## 2023-05-10 MED ORDER — SCOPOLAMINE BASE 1 MG OVER 3 DAYS TD PT3D
1 | TRANSDERMAL | 0 refills | Status: DC
Start: 2023-05-10 — End: 2023-05-15
  Administered 2023-05-13: 22:00:00 1 via TRANSDERMAL

## 2023-05-10 MED ORDER — FERROUS SULFATE 325 MG (65 MG IRON) PO TAB
325 mg | Freq: Every day | ORAL | 0 refills | Status: DC
Start: 2023-05-10 — End: 2023-05-13
  Administered 2023-05-10 – 2023-05-13 (×4): 325 mg via ORAL

## 2023-05-10 MED ORDER — ONDANSETRON HCL (PF) 4 MG/2 ML IJ SOLN
4 mg | INTRAVENOUS | 0 refills | Status: DC | PRN
Start: 2023-05-10 — End: 2023-05-15

## 2023-05-10 MED ORDER — ONDANSETRON HCL (PF) 4 MG/2 ML IJ SOLN
4 mg | Freq: Once | INTRAVENOUS | 0 refills | Status: CP
Start: 2023-05-10 — End: ?
  Administered 2023-05-10: 12:00:00 4 mg via INTRAVENOUS

## 2023-05-10 MED ORDER — MILRINONE IN 5 % DEXTROSE 20 MG/100 ML (200 MCG/ML) IV PGBK
.3 ug/kg/min | INTRAVENOUS | 0 refills | Status: DC
Start: 2023-05-10 — End: 2023-05-10

## 2023-05-10 MED ORDER — GUAIFENESIN 600 MG PO TA12
600 mg | Freq: Once | ORAL | 0 refills | Status: CP
Start: 2023-05-10 — End: ?
  Administered 2023-05-10: 12:00:00 600 mg via ORAL

## 2023-05-10 MED ORDER — ACETAMINOPHEN 500 MG PO TAB
1000 mg | ORAL | 0 refills | Status: DC | PRN
Start: 2023-05-10 — End: 2023-05-15
  Administered 2023-05-13: 04:00:00 1000 mg via ORAL

## 2023-05-10 MED ORDER — NITROGLYCERIN 0.4 MG SL SUBL
.4 mg | SUBLINGUAL | 0 refills | Status: DC | PRN
Start: 2023-05-10 — End: 2023-05-15

## 2023-05-10 MED ORDER — APIXABAN 5 MG PO TAB
5 mg | Freq: Two times a day (BID) | ORAL | 0 refills | Status: DC
Start: 2023-05-10 — End: 2023-05-13
  Administered 2023-05-10 – 2023-05-13 (×5): 5 mg via ORAL

## 2023-05-10 MED ORDER — ACYCLOVIR 400 MG PO TAB
400 mg | Freq: Two times a day (BID) | ORAL | 0 refills | Status: DC
Start: 2023-05-10 — End: 2023-05-13
  Administered 2023-05-11 – 2023-05-13 (×6): 400 mg via ORAL

## 2023-05-10 MED ORDER — ACETAMINOPHEN 500 MG PO TAB
1000 mg | Freq: Once | ORAL | 0 refills | Status: CP
Start: 2023-05-10 — End: ?
  Administered 2023-05-10: 12:00:00 1000 mg via ORAL

## 2023-05-10 MED ORDER — ALBUTEROL SULFATE 90 MCG/ACTUATION IN HFAA
2 | RESPIRATORY_TRACT | 0 refills | Status: DC | PRN
Start: 2023-05-10 — End: 2023-05-11
  Administered 2023-05-11: 04:00:00 2 via RESPIRATORY_TRACT

## 2023-05-10 MED ORDER — POTASSIUM CHLORIDE 20 MEQ PO TBTQ
40 meq | Freq: Two times a day (BID) | ORAL | 0 refills | Status: DC
Start: 2023-05-10 — End: 2023-05-11
  Administered 2023-05-11: 02:00:00 40 meq via ORAL

## 2023-05-10 MED ORDER — FUROSEMIDE 10 MG/ML IJ SOLN
80 mg | Freq: Once | INTRAVENOUS | 0 refills | Status: CP
Start: 2023-05-10 — End: ?
  Administered 2023-05-10: 18:00:00 80 mg via INTRAVENOUS

## 2023-05-10 MED ORDER — ONDANSETRON 4 MG PO TBDI
4 mg | ORAL | 0 refills | Status: DC | PRN
Start: 2023-05-10 — End: 2023-05-15

## 2023-05-10 MED ORDER — SCOPOLAMINE BASE 1 MG OVER 3 DAYS TD PT3D
1 | TRANSDERMAL | 0 refills | Status: DC
Start: 2023-05-10 — End: 2023-05-10
  Administered 2023-05-10: 13:00:00 1 via TRANSDERMAL

## 2023-05-10 MED ORDER — ROSUVASTATIN 20 MG PO TAB
10 mg | Freq: Every day | ORAL | 0 refills | Status: DC
Start: 2023-05-10 — End: 2023-05-13
  Administered 2023-05-10 – 2023-05-13 (×4): 10 mg via ORAL

## 2023-05-10 MED ORDER — PROCHLORPERAZINE EDISYLATE 5 MG/ML IJ SOLN
10 mg | Freq: Once | INTRAVENOUS | 0 refills | Status: CP
Start: 2023-05-10 — End: ?
  Administered 2023-05-10: 12:00:00 10 mg via INTRAVENOUS

## 2023-05-10 MED ORDER — POLYETHYLENE GLYCOL 3350 17 GRAM PO PWPK
1 | Freq: Every day | ORAL | 0 refills | Status: DC | PRN
Start: 2023-05-10 — End: 2023-05-15

## 2023-05-10 MED ORDER — PANTOPRAZOLE 40 MG PO TBEC
40 mg | Freq: Two times a day (BID) | ORAL | 0 refills | Status: DC
Start: 2023-05-10 — End: 2023-05-13
  Administered 2023-05-11 – 2023-05-13 (×6): 40 mg via ORAL

## 2023-05-10 MED ORDER — CEFTRIAXONE INJ 1GM IVP
1 g | INTRAVENOUS | 0 refills | Status: DC
Start: 2023-05-10 — End: 2023-05-13
  Administered 2023-05-11 – 2023-05-13 (×3): 1 g via INTRAVENOUS

## 2023-05-10 MED ORDER — INSULIN ASPART 100 UNIT/ML SC FLEXPEN
0-6 [IU] | Freq: Before meals | SUBCUTANEOUS | 0 refills | Status: DC
Start: 2023-05-10 — End: 2023-05-13
  Administered 2023-05-11: 18:00:00 1 [IU] via SUBCUTANEOUS

## 2023-05-10 MED ORDER — DEXTROMETHORPHAN-GUAIFENESIN 10-100 MG/5 ML PO SYRP
10 mL | ORAL | 0 refills | Status: DC | PRN
Start: 2023-05-10 — End: 2023-05-15
  Administered 2023-05-11 – 2023-05-13 (×6): 10 mL via ORAL

## 2023-05-10 MED ORDER — METHOCARBAMOL 100 MG/ML IJ SOLN
500 mg | Freq: Once | INTRAVENOUS | 0 refills | Status: CP
Start: 2023-05-10 — End: ?
  Administered 2023-05-10: 13:00:00 500 mg via INTRAVENOUS

## 2023-05-10 MED ORDER — EUCALYPTUS-MENTHOL MM LOZG
1 | ORAL | 0 refills | Status: DC | PRN
Start: 2023-05-10 — End: 2023-05-15
  Administered 2023-05-12 – 2023-05-13 (×2): 1 via ORAL

## 2023-05-10 MED ORDER — PATCH DOCUMENTATION - SCOPOLAMINE BASE 1 MG/72HR
1 | Freq: Two times a day (BID) | TRANSDERMAL | 0 refills | Status: DC
Start: 2023-05-10 — End: 2023-05-10

## 2023-05-10 MED ORDER — MELATONIN 5 MG PO TAB
5 mg | Freq: Every evening | ORAL | 0 refills | Status: DC | PRN
Start: 2023-05-10 — End: 2023-05-15
  Administered 2023-05-11 – 2023-05-13 (×3): 5 mg via ORAL

## 2023-05-10 MED ORDER — CYANOCOBALAMIN (VITAMIN B-12) 500 MCG PO TAB
5000 ug | Freq: Every day | ORAL | 0 refills | Status: DC
Start: 2023-05-10 — End: 2023-05-13
  Administered 2023-05-10 – 2023-05-13 (×4): 5000 ug via ORAL

## 2023-05-10 MED ORDER — CHOLECALCIFEROL (VITAMIN D3) 25 MCG (1,000 UNIT) PO TAB
1000 [IU] | Freq: Every day | ORAL | 0 refills | Status: DC
Start: 2023-05-10 — End: 2023-05-13
  Administered 2023-05-10 – 2023-05-13 (×4): 1000 [IU] via ORAL

## 2023-05-10 MED ORDER — AMIODARONE 200 MG PO TAB
200 mg | Freq: Every day | ORAL | 0 refills | Status: DC
Start: 2023-05-10 — End: 2023-05-15
  Administered 2023-05-10 – 2023-05-14 (×5): 200 mg via ORAL

## 2023-05-10 MED ORDER — CEFTRIAXONE INJ 2GM IVP
2 g | Freq: Once | INTRAVENOUS | 0 refills | Status: CP
Start: 2023-05-10 — End: ?
  Administered 2023-05-10: 16:00:00 2 g via INTRAVENOUS

## 2023-05-10 MED ORDER — MILRINONE IN 5 % DEXTROSE 20 MG/100 ML (200 MCG/ML) IV PGBK
.3 ug/kg/min | INTRAVENOUS | 0 refills | Status: DC
Start: 2023-05-10 — End: 2023-05-13
  Administered 2023-05-10 – 2023-05-13 (×6): 0.3 ug/kg/min via INTRAVENOUS

## 2023-05-10 MED ORDER — BUDESONIDE-FORMOTEROL 160-4.5 MCG/ACTUATION IN HFAA
2 | Freq: Every day | RESPIRATORY_TRACT | 0 refills | Status: DC
Start: 2023-05-10 — End: 2023-05-11

## 2023-05-10 MED ORDER — DEXTROSE 50 % IN WATER (D50W) IV SYRG
12.5-25 g | INTRAVENOUS | 0 refills | Status: DC | PRN
Start: 2023-05-10 — End: 2023-05-14

## 2023-05-10 MED ORDER — ALBUTEROL SULFATE 90 MCG/ACTUATION IN HFAA
2 | Freq: Two times a day (BID) | RESPIRATORY_TRACT | 0 refills | Status: DC | PRN
Start: 2023-05-10 — End: 2023-05-15
  Administered 2023-05-13 – 2023-05-14 (×3): 2 via RESPIRATORY_TRACT

## 2023-05-10 MED ORDER — DOXYCYCLINE 100 MG/100 ML IVPB (MB+)
100 mg | Freq: Once | INTRAVENOUS | 0 refills | Status: CP
Start: 2023-05-10 — End: ?
  Administered 2023-05-10 (×2): 100 mg via INTRAVENOUS

## 2023-05-10 MED ORDER — DEXAMETHASONE 4 MG PO TAB
8 mg | ORAL | 0 refills | Status: DC
Start: 2023-05-10 — End: 2023-05-12

## 2023-05-10 MED ORDER — GUAIFENESIN 600 MG PO TA12
600 mg | Freq: Two times a day (BID) | ORAL | 0 refills | Status: DC
Start: 2023-05-10 — End: 2023-05-10

## 2023-05-10 MED ORDER — BENZONATATE 100 MG PO CAP
100 mg | Freq: Three times a day (TID) | ORAL | 0 refills | Status: DC
Start: 2023-05-10 — End: 2023-05-15
  Administered 2023-05-11 – 2023-05-15 (×13): 100 mg via ORAL

## 2023-05-10 MED ORDER — CEFTRIAXONE INJ 1GM IVP
1 g | Freq: Once | INTRAVENOUS | 0 refills | Status: DC
Start: 2023-05-10 — End: 2023-05-10

## 2023-05-10 MED ORDER — MAGNESIUM OXIDE 400 MG (241.3 MG MAGNESIUM) PO TAB
400 mg | Freq: Every evening | ORAL | 0 refills | Status: DC
Start: 2023-05-10 — End: 2023-05-13
  Administered 2023-05-11 – 2023-05-13 (×3): 400 mg via ORAL

## 2023-05-10 MED ORDER — SERTRALINE 50 MG PO TAB
50 mg | Freq: Every day | ORAL | 0 refills | Status: DC
Start: 2023-05-10 — End: 2023-05-15
  Administered 2023-05-10 – 2023-05-13 (×4): 50 mg via ORAL

## 2023-05-10 MED ORDER — BUMETANIDE 0.25 MG/ML IJ SOLN
3 mg | Freq: Once | INTRAVENOUS | 0 refills | Status: CP
Start: 2023-05-10 — End: ?
  Administered 2023-05-10: 22:00:00 3 mg via INTRAVENOUS

## 2023-05-10 MED ORDER — PATCH DOCUMENTATION - SCOPOLAMINE BASE 1 MG/72HR
1 | Freq: Two times a day (BID) | TRANSDERMAL | 0 refills | Status: DC
Start: 2023-05-10 — End: 2023-05-15

## 2023-05-10 MED ORDER — SENNOSIDES-DOCUSATE SODIUM 8.6-50 MG PO TAB
1 | Freq: Every day | ORAL | 0 refills | Status: DC | PRN
Start: 2023-05-10 — End: 2023-05-15

## 2023-05-10 MED ADMIN — WATER FOR INJECTION, STERILE IJ SOLN [79513]: 20 mL | INTRAVENOUS | @ 16:00:00 | Stop: 2023-05-10 | NDC 00409488723

## 2023-05-10 NOTE — Case Management (ED)
Case Management 30 Day Readmission Assessment      NAME: Raven [name]                    MRN: [id number]              DOB: 10/30/1945          AGE: 78 y.o.  ADMISSION DATE: 05/10/2023             DAYS ADMITTED: LOS: 0 days    Today's Date: 05/10/2023    Source of Information: EMR    Expected Discharge Date:       Plan:  Pt with multiple re-admissions in December which include:  04/27/23-05/06/23  04/19/23-04/23/23  04/11/23-04/15/23  04/02/23-04/09/23    Patient returned home after every admissions with resumption of [facility] and milrinone infusion through [facility].       Previous Inpatient Discharge Date:    05/06/23    Previous Case Management admission assessment dated 04/20/23 is copied at the end of this note for reference and was reviewed with patient/family.  Changes to information from previous admission assessment are: none.     Acute Hospital Stay:  Acute Hospital Stay: Yes  Was patient's stay within the last 30 days?: Yes  Name of Hospital: [facility]  Readmission Code Group: 7. Advancement of Disease / Chronic Illness Management    Prior Living Situation/Admitted from?   Type of Residence: Home, with Home Health or other assistance    Post-Acute Services/DME Last Discharge:  Were post-acute services/DME arranged at previous disharge?: Yes  Which services/DME were arranged at previous discharge?: Home health, Other  Does patient/family feel services/DME were adequate?: Yes    Lack of services:  At or after previous discharge, what did the patient/family feel they lacked?: Not applicable    Previous Discharge Resources Provided:       Social Determinants of Health (SDOH):     Social Drivers of Health     Tobacco Use: Medium Risk (05/10/2023)    Patient History     Smoking Tobacco Use: Former     Smokeless Tobacco Use: Never     Passive Exposure: Never   Alcohol Use: Not At Risk (06/27/2021)    Received from [facility]    AUDIT-C     Frequency of Alcohol Consumption: Never Average Number of Drinks: Patient does not drink     Frequency of Binge Drinking: Never   Physicist, medical Strain: Not At Risk (04/29/2023)    Financial Resource Strain     Utilities Shut off in last Year: No     Difficulty Obtaining Child Care: Not on file     Lack of Money for Doctor Visit: Not on file     Skipped Medication to [employer]: Not on file   Recent Concern: Financial Resource Strain - At Risk (04/20/2023)    Financial Resource Strain     Utilities Shut off in last Year: Yes     Difficulty Obtaining Child Care: Not on file     Lack of Money for Doctor Visit: Not on file     Skipped Medication to [employer]: Not on file   Food Insecurity: Not At Risk (04/29/2023)    Food Insecurity     Worried Food Would Run Out: Never True     Lack of Money to [employer] Food: Not on file   Transportation Needs: Not At Risk (04/29/2023)    Transportation Needs  Lack of Transportation for Health Care: No   Recent Concern: Transportation Needs - At Risk (04/20/2023)    Transportation Needs     Lack of Transportation for Health Care: Yes   Stress: No Stress Concern Present (03/18/2022)    Received from [facility], [employer] of Occupational Health - Occupational Stress Questionnaire     Feeling of Stress : Not at all   Social Connections: Moderately Isolated (03/18/2022)    [facility]    Social Connection and Isolation Panel [NHANES]     Frequency of Communication with Friends and Family: More than three times a week     Frequency of Social Gatherings with Friends and Family: Once a week     Attends Religious Services: Never     Database administrator or Organizations: Yes     Attends Engineer, structural: More than 4 times per year     Marital Status: Widowed   Health Literacy: Inadequate Health Literacy (05/07/2023)    Received from [facility]    OASIS B1300: Health Literacy     Frequency of needing help to read materials from doctor or pharmacy: Sometimes   Depression: Not at risk (01/14/2023)    Received from [facility]    PHQ-2     Patient Health Questionnaire-2 Score: 0   Housing Stability: Not At Risk (04/29/2023)    Housing Stability     Concern with Stable Housing : No   Recent Concern: Housing Stability - At Risk (04/20/2023)    Housing Stability     Concern with Stable Housing : Yes        Transportation:  Does the Patient Need Case Management to Arrange Discharge Transport? (ex: facility, ambulance, wheelchair/stretcher, Medicaid, cab, other): No  Will the Patient Use Family Transport?: Yes    Raven [name]    On [employer]    Case Management Admission Assessment     NAME:Raven [name]                          MRN: [id number]             DOB:05/30/1945          AGE: 78 y.o.  ADMISSION DATE: 04/19/2023             DAYS ADMITTED: LOS: 0 days      Today?s Date: 04/20/2023     Source of Information: EMR, met with pt at bedside and son via phone        Plan  Plan: Case Management Assessment, Discharge Planning for Home with Post-Acute Care Needs, Assist PRN with SW/NCM Services, Psychosocial Assessment  This CM met with pt for assessment on this date.  Provided contact information and explanation of SW/NCM roles.  Reviewed [facility], Preparing for Discharge, and [facility] hand-outs.  Provided opportunity for questions and discussion. Pt/family encouraged to contact Case Management team with questions and concerns during hospitalization and until patient is able to transition back to the patient's primary care physician.      Return home with [facility]. Son to transport. No new concerns at this time. Has necessary DME and family support     Patient Address/Phone  [address]  [place] [address]  [phone] (home)      Emergency Contact  Extended Emergency Contact Information  Primary Emergency Contact: Swingler,CLIFTON  Home Phone: [phone]  Mobile Phone: [phone]  Relation: Son  Secondary Emergency Contact: [facility]  Home Phone: [phone]  Mobile Phone: [phone]  Relation: Relative  Interpreter needed? No     Healthcare Directive  Healthcare Directive: Yes, patient has a healthcare directive  Type of Healthcare Directive: Durable power of attorney for healthcare  Location of Healthcare Directive: Patient does not have it with him/her  Would patient like to fill out a (a new) Healthcare Directive?: No, patient declined        Transportation  Does the Patient Need Case Management to Arrange Discharge Transport? (ex: facility, ambulance, wheelchair/stretcher, Medicaid, cab, other): No  Will the Patient Use Family Transport?: Yes  Transportation Name, Phone and Availability #1: Bertis Ruddy [phone]     Expected Discharge Date  04/23/2023      Living Situation Prior to Admission  Living Arrangements  Type of Residence: Home, with Home Health or other assistance  Living Arrangements: Family members  Financial risk analyst / Tub: Psychologist, counselling, Tub/Shower Unit  How many levels in the residence?: 2  Can patient live on one level if needed?: Yes  Does residence have entry and/or inside stairs?: Yes (2-STE)  Assistance needed prior to admit or anticipated on discharge: Yes  Who provides assistance or could if needed?: son, dil and dtr  Are they in good health?: Yes  Can support system provide 24/7 care if needed?: Yes  Level of Function   Prior level of function: Needs assist with ADLs  Which ADLs require assistance?: transport, medication  Who assists with ADLs?: family  Cognitive Abilities   Cognitive Abilities: Alert and Oriented, Understands nature of health condition, Engages in problem solving and planning     Financial Resources  Coverage  Primary Insurance: Medicare Replacement [facility] Medicare)  Medication Coverage    Medication Coverage:  ([employer])  Are current medications affordable?: Yes  Do You Manage Your Own Medications?: Yes  Source of Income   Source Of Income: SSI  Financial Assistance Needed?  na     Psychosocial Needs  Mental Health  Mental Health History: No  Substance Use History  Substance Use History Screen: No  Other  na     Current/Previous Services  PCP  Barbaraann Rondo, [phone], [phone]  Pharmacy     [facility] [address], [place] - [address]  [address]  [place] [address]  Phone: [phone] Fax: [phone]     Durable Medical Equipment   Durable Medical Equipment at home: CPAP/BiPAP, Shower Chair, Environmental consultant, Wheelchair (power)  Home Health  Receiving home health: Yes  Agency name: [facility]  Would patient use this agency again?: Yes  Hemodialysis or Peritoneal Dialysis  Undergoing hemodialysis or peritoneal dialysis: No  Tube/Enteral Feeds  Receive tube/enteral feeds: No  Infusion  Receive infusions: No  Private Duty  Private duty help used: No  Home and Community Based Services  Home and community based services: No  Ryan White  Ryan White: No  Hospice  Hospice: No  Outpatient Therapy  PT: No  OT: No  SLP: No  Skilled Nursing Facility/Nursing Home  SNF: No  NH: No  Inpatient Rehab  IPR: No  Long-Term Acute Care Hospital  LTACH: No  Acute Hospital Stay  Acute Hospital Stay: Yes  Was patient's stay within the last 30 days?: Yes  Name of Hospital: [facility]  Readmission Code Group: 7. Advancement of Disease / Chronic Illness Management  7. Advancement of Disease / Chronic Illness Management:  7a1. Cardiac: CHF / AMI

## 2023-05-10 NOTE — Consults
PALLIATIVE CARE INPATIENT NOTE     Name: Raven [name]            MRN: [id number]                DOB: April 10, 1946          Age: 78 y.o.  Admission Date: 05/10/2023             LOS: 0 days    ASSESSMENT/PLAN     Raven [name] is a 78 y.o. 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   Recent hospitalizations for GI bleed (03/24/2023) and HF exacerbation (04/19/2023). Presented with worsening dyspnea, cough, N/V, dysuria. Found to have RSV.   Palliative Care consulted for complex medical decision making.     #Acute on chronic HFrE,  EF: 30%   - She follows with Dr. Herma Carson. [name] and [name], APRN  #Chronic cardiogenic shock on chronic milrinone  #Ischemic cardiomyopathy  #CAD s/p CABG and PCI  #CRT-D in situ  #PAF  #Acute pancreatitis/Pancreatic cystic mass   #UTI  #Multiple myeloma   - She follows with [facility] oncology and currently on Daratumumab     #Goals of Care/Complex Medical Decision Making related to Heart Failure  Discussion:   Palliative Care team met (Dr. Quenton Fetter NP, Andre Lefort, [facility]) with patient and introduced our role to work alongside primary care team to provide an extra layer of support by assisting with: symptom management, informed medical decision making, honoring goals and preferences, and resources for care outside the hospital.    Understanding of Illness/Prognosis:   Patient reports she was recently hospitalized for her heart failure. She reports being discharged, but not feeling ready to be discharged when she was as she was still having a lot of coughing symptoms. She reports having been in the hospital more lately than she has been at home - 6 times in 6 weeks, 9 times in last 9 months. Patient reports living at home with son, DIL, and daughter.     Goals of Care:  Patient reports she is tired of being sick. She has had multiple issues that have brought her to hospital recently. She reports coming to the hospital still helps her feel better so she'd want to keep doing that for now.     Discussed the trajectory of chronic illness. Discussed each time getting knocked down, but each time not recovering back to the previous baseline. We were honest about our worry to get her back to her previous functional state.     Discussed if at any point she isn't getting good quality time continuing to come to hospital or she no longer desires aggressive care, then we could explore comfort focused care and use hospice supports. She reports not knowing what hospice is. We provided a brief background of philosophy and services.     She reports not being able to get her chemo since being in the hospital last time. She reports she would desire to keep getting her chemo if she can.     RECOMMENDATIONS:   GOC: Continue Aggressive Care  Ordered symptom meds for cough  PALLIATIVE CARE PLANNING     Advance Care Planning:   Identified Health Care Decision Maker -  Son [place], [place], Daughter [place] - Would benefit from completion of DPOA  TPOPP/OSHDNAR - Not discussed    PC Clinic - Recommend outpatient Palliative Care follow up - could consider a Palliative Care HF clinic referral with [name], NP. Will discuss with patient at next visit.    Medication safety - NA    Disposition planning - Too soon to determine - PTA [facility] and milrinone infusion through [facility]     SUBJECTIVE     CC/Reason for Visit:Complex medical decision making  Palliative care consult with recurrent hospitalizations and discuss goals of care-has not been seen since 08/2022  Additional history from: chart    History of Present Illness: 78 y.o. 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   Recent hospitalizations for GI bleed (03/24/2023) and HF exacerbation (04/19/2023). Presented with worsening dyspnea, cough, N/V, dysuria. Found to have RSV.     Interval History:  Patient resting on ED gurney. No family at bedside.   Pain - denies  Shortness of breath - yes, doesn't wear O2 PTA and needing it in the ED at 2L. Reports a cough that is making SOB worse. RSV+. She had a coughing fit while we were bedside and we had to turn up O2 to 4L. Ordered meds.   Nausea/Vomiting - denies  Diarrhea/Constipation - sometimes constipated, sometimes runny. Has senna at home when constipated.  Swelling - N/A  Decreased of appetite - N/A  Insomnia/Tired/Sleepy - N/A  Anxiety/Depression - N/A    Past Medical History:    Anemia    Anxiety    Arthritis    Asthma    Asymptomatic stenosis of right carotid artery    Atrial fibrillation (HCC)    Back pain    Cancer of uterus (HCC)    Chronic kidney disease    COPD (chronic obstructive pulmonary disease) (HCC)    Coronary artery disease    Diabetes mellitus (HCC)    Dyslipidemia    Flank mass    Fluid retention    GI bleed    Heart disease    Heart failure (HCC)    Hypertension    Multiple myeloma (HCC)    OSA on CPAP    Other malignant neoplasm without specification of site    Vision decreased     Surgical History:   Procedure Laterality Date    CORONARY ARTERY BYPASS GRAFT  2010    CABG 3 V    ROBOT ASSISTED LOW ANTERIOR RESECTION N/A 08/10/2022    Performed by Benetta Spar, MD at [facility] OR    ESOPHAGOGASTRODUODENOSCOPY WITH BIOPSY - FLEXIBLE N/A 09/17/2022    Performed by Tempie Hoist, DO at [facility] ENDO    CATHETERIZATION RIGHT HEART N/A 09/18/2022    Performed by Cath, Physician at [facility] CATH LAB    REMOVAL AND REPLACEMENT IMPLANTABLE DEFIBRILLATOR GENERATOR - SINGLE LEAD SYSTEM Left 11/04/2022    Performed by Kathreen Cornfield, MD at [facility] EP LAB    INSERTION/ REPLACEMENT PERMANENT PACEMAKER  WITH ATRIAL LEAD Left 11/04/2022    Performed by Kathreen Cornfield, MD at [facility] EP LAB    Injection Venography Extremity Left 11/04/2022    Performed by Kathreen Cornfield, MD at [facility] EP LAB    ESOPHAGOGASTRODUODENOSCOPY WITH BIOPSY - FLEXIBLE N/A 03/29/2023    Performed by Buckles, Vinnie Level, MD at [facility] ENDO    ESOPHAGOGASTRODUODENOSCOPY WITH SNARE REMOVAL TUMOR/ POLYP/ OTHER LESION - FLEXIBLE N/A 03/29/2023    Performed by Buckles, Vinnie Level, MD at [facility] ENDO    ESOPHAGOGASTRODUODENOSCOPY WITH CONTROL OF BLEEDING - FLEXIBLE N/A 04/04/2023    Performed by Buckles, Vinnie Level, MD at [facility] ENDO    COLONOSCOPY WITH BIOPSY - FLEXIBLE N/A 04/30/2023    Performed by Onnie Boer, MD at [facility] ENDO    BONE MARROW BIOPSY      CARDIAC DEFIBRILLATOR PLACEMENT      [employer]    CAROTID ENDARDECTOMY Right     COLONOSCOPY      HX CORONARY STENT PLACEMENT      HX HEART CATHETERIZATION      HX HYSTERECTOMY      TUNNELED VENOUS PORT PLACEMENT Right     Chest     Social History     Tobacco Use    Smoking status: Former     Current packs/day: 0.00     Average packs/day: 1 pack/day for 20.0 years (20.0 ttl pk-yrs)     Types: Cigarettes     Start date: 93     Quit date: 2010     Years since quitting: 15.0     Passive exposure: Never    Smokeless tobacco: Never   Vaping Use    Vaping status: Never Used   Substance Use Topics    Alcohol use: Not Currently    Drug use: Not Currently     Social History     Social History Narrative    Not on file     Occupation:  Hobbies or other:   Living situation:With son  Marital status: Widowed  Children:2  Significant loved ones:  Spiritual needs: N/A    Family History:   Family History   Problem Relation Name Age of Onset    Cancer Mother      Diabetes Mother      Hypertension Mother      Cancer-Breast Sister      Cancer-Ovarian Sister      Cancer Sister      Diabetes Sister      Heart Disease Sister      Hypertension Sister      Heart Disease Brother      Hypertension Brother      Stroke Brother       Family Status   Relation Name Status    Mother  Deceased    Father  Deceased    Sister  Deceased    Brother  Deceased   No partnership data on file       ROS: Review of Systems   Constitutional:  Positive for malaise/fatigue.   Respiratory:  Positive for cough, sputum production and shortness of breath.    Gastrointestinal:  Negative for abdominal pain, constipation, diarrhea, nausea and vomiting.   Neurological:  Positive for weakness.     OBJECTIVE     Blood pressure 99/62, pulse 79, temperature 36.5 ?C (97.7 ?F), height 157.5 cm (5' 2), weight 72.5 kg (159 lb 13.3 oz), SpO2 98%.  Physical Exam  See Dr. Bluford Main PE  Lab Results:  CBC   Lab Results   Component Value Date/Time    WBC 10.70 05/10/2023 05:37 AM    HGB 9.6 (L) 05/10/2023 05:37 AM    PLTCT 288 05/10/2023 05:37 AM     Lab Results   Component Value Date/Time    NEUT 70 05/10/2023 05:37 AM    ANC 7.50 (H) 05/10/2023 05:37 AM      Chemistries   Lab Results   Component Value Date/Time    NA 145 05/10/2023 05:37 AM    K 5.3 (H) 05/10/2023 05:37 AM    BUN 48 (H) 05/10/2023 05:37 AM    CR 2.85 (H) 05/10/2023 05:37 AM    GLU 141 (H) 05/10/2023 05:37 AM     Lab Results   Component Value Date/Time    CA 8.8 05/10/2023 05:37 AM    PO4 4.0 04/27/2023 06:29 PM    ALBUMIN 3.9 05/10/2023 05:37 AM    TOTPROT 6.2 05/10/2023 05:37 AM    ALKPHOS 105 05/10/2023 05:37 AM    AST 40 05/10/2023 05:37 AM    ALT 15 05/10/2023 05:37 AM    TOTBILI 0.9 05/10/2023 05:37 AM    GFR 17 (L) 05/10/2023 05:37 AM        Other Pertinent Diagnostic Results:   CT A/P 05/10/23  IMPRESSION   1. Development of mild peripancreatic thickening/stranding that most   likely represents acute pancreatitis.   2. Moderate thickening/stranding has redeveloped adjacent to the region of   the gastric outlet and duodenum, which was also present in May 2024 and   which likely represents an additional manifestation of the pancreatitis.    Gastritis or duodenitis or less likely.   3. Development of mild interstitial edema in the lung bases. Additional   patchy interstitial and alveolar opacities that are new may represent   additional manifestations of edema or infectious pneumonitis.   4. Development of mild ill-defined stranding/edema within the pelvis   extraperitoneal spaces that either represents edema or inflammation.   5. Unchanged bilobed cystic lesion in the tail of the pancreas.   Additional, adjacent smaller cystic lesions and ductal dilation has   slightly decreased.     No other new pertinent diagnostics available for review today    Other pertinent documentation and collaboration details:  Patient medication list reviewed today.  Prior notes reviewed from healthcare team members since yesterday.  Case discussed with primary team, bedside RN.    I have collaborated with Palliative Dr. Maia Plan, Peyton Najjar Miskec SW in the development of patient plan of care.    Nila Nephew, DNP, APRN, AGNP-C  Palliative Medicine  Available on Voalte/[facility] Connect  Phone: [phone]  Nights/Weekends - Page [phone] for Palliative Care On-Call     Thank you for allowing [place] to assist in the care of this patient. Please call with questions/concerns.    Administrative:  This is a shared note. The patient and caregiver may read this note. I support patient's rights to access their medical information in an open and transparent manner. We are partners together to improve your health. If you are a patient or caregiver with concerns, please reach out to [place] by contacting the bedside nurse and asking for Palliative Care to return your call.    Palliative Care Data:  Patient Location at Time of Consultation: Hospital - General floor (includes step-down,  pre-op)  Primary Diagnosis: Cardiovascular: Heart Failure

## 2023-05-10 NOTE — ED Notes
Ordered food for patient

## 2023-05-10 NOTE — Consults
Heart Failure Consult    NAME:Raven [name]             MRN: [id number]                 DOB:1945/09/30          AGE: 78 y.o.  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     She follows with Dr. Herma Carson. [name] and [name], APRN.    She presented to the ED on 05/10/23 with complaints of not feeling well, dyspnea, dry cough, nausea/vomiting and dysuria.  Her NT-pro BNP was elevated at 15065 (previously 11379) and her troponins were slightly elevated at 49>40>39.  Her UA was suspicious for UTI.  CXR revealed volume overload with slightly improved pulmonary edema.  Her creatinine was slightly elevated from her baseline as well.      Unfortunately, this is her 6th hospitalization over the last 6 weeks and 9th admission over the past 9 months.   Heart Failure was consulted to assist with management.    Admitted from 11/27-12/4/24 with melena and anemia, received PRBCs and underwent EGD on 12/2 which revealed 1.5 cm pedunculated viable polyp in duodenum which was clipped, Aspirin and Plavix were discontinued and she remained on Eliquis for anticoagulation.  Admitted from 12/6-12/13/24 with hematemesis, melena, symptomatic anemia.  Although her Plavix and aspirin had been discontinued during prior admission, pt reported she continued taking both aspirin and Plavix along with her Eliquis.  She required x4 units of PRBCs. Repeat EGD on 12/8 showed ulcer in duodenum and visible vessel with no active bleeding which was injected with epinephrine and Endo Clip placed.  Admitted from 12/15-12/19/24 for decompensated heart failure, required IV diuresis. It was noted that her milrinone infusion had been off for unknown amount of time.  For ongoing anemia she required x1 unit of PRBCs.  Admitted from 12/23-12/27/24 after she presented to HF clinic reporting symptoms of shortness of breath, concerns of hypervolemia and ~10 lbs weight gain.  She received IV diuresis, had developed a cough which was later identified as RSV when she was seen by her local ED.  Admitted from 12/31-05/06/23 after presenting to the ED with complaints of dyspnea and hemotochezia.  She was found to have RSV.  She required IV bumex with metolazone for diuresis.  She underwent colonoscopy for workup of hematochezia.  She was discharged home on torsemide 40mg  po BID.    She reports that she starting  feeling poorly on the day prior to admission with increased shortness of breath and cough.  She has been weighing herself and her home weights have been between 159-160 pounds.  She reports she has been drinking and eating normally at home, and she has not missed any medications.  Her milrinone is infusing without difficulty.       Recommendations:  Chronic cardiogenic shock: continue home milrinone 0.60mcg/kg/min  Diuretics: received lasix 80mg  IV x 1 thus far.    Palliative care consult with recurrent hospitalizations and discuss goals of care-has not been seen since 08/2022  HFrEF GDMT: limited with CKD and hx cardiogenic shock. Toprol XL stopped during hospitalization 12/23-12/27. Will not resume at this time. Previously on [name] which was recently dc'd, defer resumption at this time with reduced creatinine clearance.  Paroxsymal AFib: continue Eliquis 5 mg twice daily and PTA Amiodarone 200 mg daily. Messaged Watchman navigator 04/29/23 to schedule appointment to discuss Watchman outpatient. [id number]   CAD: continue PTA Rosuvastatin 10 mg daily, not on aspirin with recent GIB  Pancreatic cyst: further workup as an outpatient per GI  Heart Failure will continue to follow      Ongoing:  BMP once a day. Mg level daily. Maintain K+ over 4.0 and Mg+ over 2.0   2000mg  sodium dietary restriction.  Fluid Restriction:2  Strict I/O. Goal output: net neg 1 and 2L/24 hour  Daily standing scale weight. Goal Dry Weight: 162-163 pounds        Primary team responsible for placing Post-Discharge Health System Appointment Request order set for Cardiology: Heart Failure appointment request within 24-48 hours of discharge. (Please do not place order any earlier in effort to reduce possible need for cancellation and rescheduling of visit).      Pt examined and discussed with Dr. [name], heart failure attending.      [name], APRN-NP  Available on Voalte  Heart Failure Consult Service      Assessment:   Acute on chronic systolic and diastolic HFrEF,  EF: 30%.  Ischemic Cardiomyopathy  Major Complications or Comorbidities [facility]): cardiogenic shock, acute/ acute on chronic systolic and/or diastolic heart failure, and cardiorenal syndrome (with documentation of CKD)  NYHA functional class III (marked limitation of physical activity - comfortable at rest, but less than ordinary activity causes symptoms of HF e.g., getting dressed or standing from a sitting position),   ACC Stage D (refractory HF requiring specialized interventions).   She presents with signs of hypervolemia with left  ventricular failure with signs of low flow state.  BNP Labs:   Lab Results Component Value Date    NTPROBNP 15,065 (H) 05/10/2023     High Sensitivity Troponin:  Lab Results   Component Value Date    HSTROP0HR 49 (H) 05/10/2023    HSTROP2HR 40 (H) 05/10/2023    HSTROP4HR 39 (H) 05/10/2023    HSTROPDELT4 -1 05/10/2023    HSTROPDELTA -9 05/10/2023    HIGHSTROPI 28 (H) 04/12/2023         Admission Weight: 72.1 kg (159 lb)        Most recent weights (inpatient):   Vitals:    05/10/23 0455   Weight: 72.1 kg (159 lb)        Strict I/Os:   net I/O for entire hospital stay: TBD  net for last 24 hrs: TBD  UOP last 24 hrs: TBD      PLAN:  Diuretic Therapy    Prior to admission dose    Torsemide  40mg  po BID   Given on admission    1/13: lasix 80mg  IV x 1   Daily Dosing        Guideline Directed Medical Therapy PTA Inpatient Changes   ACEI/ARB N/A-CKD IV     ARNI N/A-CKD IV     BB N/A-cardiogenic shock on home inotrope; previously tolerated Toprol XL which was discontinued recently    SGLT-2 Inhibitor None with CKD (dc'd mid-December), now with UTI    Mineralocorticoid Receptor Antagonist N/A-CKD IV     Hydralazine/Nitrate none    Ivabradine none    Heart Rhythm Management Therapy Yes (ICD)     Anticoagulation for Afib/flutter Elqiuis 5mg  po BID    Cardiac Rehab Evaluation for LVEF < or equal to 40% Recently ordered 04/12/23    7-Day post hospital follow up scheduled within 24-48 hours of discharge       Inotrope Therapy Dose & Adjustments  (document symptom and hemodynamic response if pertinent)   Milrinone 0.3 mcg/kg/min  Started during hospitalization May 2024        Testing/Procedures   04/12/23 Echo:  Eccentric hypertrophy of the left ventricle with severe dilatation of the left ventricular chamber  Left ventricular systolic function is moderately to severely impaired with an estimate ejection fraction of 30% and global hypokinesis  Right ventricle is moderately dilated with normal function  The left atrium is severely dilated  Pacemaker lead seen in the right atrium and right ventricle  Severe mitral valve insufficiency  Severe tricuspid valve insufficiency  No pericardial effusion  Severe pulmonary hypertension with estimated peak systolic pulmonary artery pressure of 73 mmHg     RHC 09/18/22 (weight 165 pounds)  RA 13  RV 70/12  PA 72/27 with mean 47  PCWP 17  TPG 30  PVR 12  CO/CI 2.5/1.4 by thermo  CO/CI 1.9/1.1 by Fick      CAD s/p CABG x3 (2010)  > cont statin therapy; aspirin and Plavix recently dc'd with GIB     S/p ICD  - ICD interrogation 04/29/23: AP <1%/VP <1% with presenting rhythm AS-VS 90s with elevated HF diagnostics    Hyperlipidemia  Lab Results   Component Value Date/Time    CHOL 107 09/16/2022 07:02 PM    TRIG 72 09/16/2022 07:02 PM    HDL 50 09/16/2022 07:02 PM    LDL 45 09/16/2022 07:02 PM    VLDL 14 09/16/2022 07:02 PM    NONHDLCHOL 57 09/16/2022 07:02 PM   > cont PTA Rosuvastatin 10 mg/day     CKD  - baseline creatinine: 2.4-2.6  - admission creatinine 1/13: 2.85  - recent creatinine trends:  Lab Results   Component Value Date/Time    CR 2.85 (H) 05/10/2023 05:37 AM    CR 2.70 (H) 05/06/2023 04:22 AM    CR 2.72 (H) 05/05/2023 03:47 AM    CR 2.43 (H) 05/04/2023 06:17 AM    CR 2.41 (H) 05/03/2023 04:19 AM   > daily labs    Acute pancreatitis  Chronic Anemia  Frequent GIB  Pancreatic cystic mass  Lab Results   Component Value Date/Time    HGB 9.6 (L) 05/10/2023 05:37 AM    HGB 8.3 (L) 05/06/2023 04:22 AM    HGB 9.0 (L) 05/05/2023 03:47 AM     Lab Results   Component Value Date    IRON 25 (L) 04/28/2023    PSAT 17 (L) 04/28/2023    TIBC 148 (L) 04/28/2023    FERRITIN 950 (H) 04/28/2023     >  s/p IV Venofer 300 mg x1 on 03/31/23  - 04/30/23: Colonoscopy: no active bleeding or blood->suspect prior bleeding may have been hemorrhoidal vs diverticular based on findings  - 05/10/23: LFTS and lipase normal: lipase 17, AST 40, ALT 15, Alk phos 105    UTI  - 1/13: complaints of dysuria with UA concerning for UTI  - no longer on SGLT2i (dc'd mid-December 2024)  - received IV ceftriaxone and dosycycline  > UA pending      Type 2 DM  Lab Results   Component Value Date/Time    HGBA1C 6.3 (H) 04/20/2023 04:40 AM    HGBA1C 8.0 (H) 09/16/2022 07:02 PM    HGBA1C 7.4 (H) 07/14/2022 02:44 PM   > management per primary team    Multiple myeloma   - She follows with [facility] oncology and currently on Daratumumab     _____________________________________________________________________________    Reason for Consultation:  Evaluation and recommendations re: heart failure     History of Present Illness: Raven [name] is a 78 y.o. female who we are asked to see for evaluation of heart failure.  She currently complains of shortness of breath and cough . She had some nausea and vomiting.  She currently denies paroxysmal nocturnal dyspnea, orthopnea, lightheadedness, near syncope, palpitations, chest pain, and abdominal fullness.    Review of Systems:  As noted above      Past Medical History:    Anemia    Anxiety    Arthritis    Asthma    Asymptomatic stenosis of right carotid artery    Atrial fibrillation (HCC)    Back pain    Cancer of uterus (HCC)    Chronic kidney disease    COPD (chronic obstructive pulmonary disease) (HCC)    Coronary artery disease    Diabetes mellitus (HCC)    Dyslipidemia    Flank mass    Fluid retention    GI bleed    Heart disease    Heart failure (HCC)    Hypertension    Multiple myeloma (HCC)    OSA on CPAP    Other malignant neoplasm without specification of site    Vision decreased     Surgical History:   Procedure Laterality Date    CORONARY ARTERY BYPASS GRAFT  2010    CABG 3 V    ROBOT ASSISTED LOW ANTERIOR RESECTION N/A 08/10/2022    Performed by Benetta Spar, MD at [facility] OR    ESOPHAGOGASTRODUODENOSCOPY WITH BIOPSY - FLEXIBLE N/A 09/17/2022    Performed by Tempie Hoist, DO at [facility] ENDO    CATHETERIZATION RIGHT HEART N/A 09/18/2022    Performed by Cath, Physician at [facility] CATH LAB    REMOVAL AND REPLACEMENT IMPLANTABLE DEFIBRILLATOR GENERATOR - SINGLE LEAD SYSTEM Left 11/04/2022    Performed by Kathreen Cornfield, MD at [facility] EP LAB    INSERTION/ REPLACEMENT PERMANENT PACEMAKER WITH ATRIAL LEAD Left 11/04/2022    Performed by Kathreen Cornfield, MD at [facility] EP LAB    Injection Venography Extremity Left 11/04/2022    Performed by Kathreen Cornfield, MD at [facility] EP LAB    ESOPHAGOGASTRODUODENOSCOPY WITH BIOPSY - FLEXIBLE N/A 03/29/2023    Performed by Buckles, Vinnie Level, MD at [facility] ENDO    ESOPHAGOGASTRODUODENOSCOPY WITH SNARE REMOVAL TUMOR/ POLYP/ OTHER LESION - FLEXIBLE N/A 03/29/2023    Performed by Buckles, Vinnie Level, MD at [facility] ENDO    ESOPHAGOGASTRODUODENOSCOPY WITH CONTROL OF BLEEDING - FLEXIBLE N/A 04/04/2023  Performed by Buckles, Vinnie Level, MD at [facility] ENDO    COLONOSCOPY WITH BIOPSY - FLEXIBLE N/A 04/30/2023    Performed by Onnie Boer, MD at [facility] ENDO    BONE MARROW BIOPSY      CARDIAC DEFIBRILLATOR PLACEMENT      [employer]    CAROTID ENDARDECTOMY Right     COLONOSCOPY      HX CORONARY STENT PLACEMENT      HX HEART CATHETERIZATION      HX HYSTERECTOMY      TUNNELED VENOUS PORT PLACEMENT Right     Chest     Family History   Problem Relation Name Age of Onset    Cancer Mother      Diabetes Mother      Hypertension Mother      Cancer-Breast Sister      Cancer-Ovarian Sister      Cancer Sister      Diabetes Sister      Heart Disease Sister      Hypertension Sister      Heart Disease Brother      Hypertension Brother      Stroke Brother       Social History     Socioeconomic History    Marital status: Widowed   Tobacco Use    Smoking status: Former     Current packs/day: 0.00     Average packs/day: 1 pack/day for 20.0 years (20.0 ttl pk-yrs)     Types: Cigarettes     Start date: 22     Quit date: 2010     Years since quitting: 15.0     Passive exposure: Never    Smokeless tobacco: Never   Vaping Use    Vaping status: Never Used   Substance and Sexual Activity    Alcohol use: Not Currently    Drug use: Not Currently        Objective:    Allergies:   Allergies   Allergen Reactions    Bortezomib RASH     Noted in Provider note 8/22 cancer treatment drug    Allopurinol HIVES    Sulfa (Sulfonamide Antibiotics) NAUSEA ONLY        Medications:  Scheduled Meds:furosemide (LASIX) injection 80 mg, 80 mg, Intravenous, ONCE  scopolamine (TRANSDERM-SCOP) 1mg  over 3 days patch 1 patch, 1 patch, Transdermal, Q72H*   And  Verification of Patch Placement and Integrity - Scopolamine base 1 mg/3 days 1 patch, 1 patch, Transdermal, BID    Continuous Infusions:   milrinone (PRIMACOR) 20 mg/D5W 100 mL infusion       PRN and Respiratory Meds:    (Not in a hospital admission)                            Vital Signs:  Last Filed                Vital Signs: 24 Hour Range   BP: 102/66 (01/13 1030)  Temp: 36.5 ?C (97.7 ?F) (01/13 0455)  Pulse: 81 (01/13 1030)  Respirations: 18 PER MINUTE (01/13 1030)  SpO2: 96 % (01/13 1030)  O2 Device: Nasal cannula (01/13 1030)  O2 Liter Flow: 2 Lpm (01/13 1030)  Height: 157.5 cm (5' 2) (01/13 0455)  BP: (88-107)/(55-85)   Temp:  [36.5 ?C (97.7 ?F)]   Pulse:  [81-85]   Respirations:  [17 PER MINUTE-26 PER MINUTE]   SpO2:  [93 %-100 %]  O2 Device: Nasal cannula  O2 Liter Flow: 2 Lpm    Intensity Pain Scale (Self Report): 3 (05/10/23 0530)      Wt Readings from Last 10 Encounters:   05/10/23 72.1 kg (159 lb)   05/06/23 74.2 kg (163 lb 9.6 oz)   04/23/23 76 kg (167 lb 9.6 oz)   04/15/23 77.6 kg (171 lb)   04/06/23 75.9 kg (167 lb 5.3 oz)   03/26/23 74.2 kg (163 lb 9.3 oz)   03/19/23 77 kg (169 lb 12.8 oz)   02/16/23 75.3 kg (166 lb)   02/15/23 75.3 kg (166 lb)   01/04/23 75.2 kg (165 lb 12.8 oz)       Physical Exam:    General Appearance: no distress, sleepy  Neck Veins: JVP 10cm, HJR positive?, severe TR and JVD difficult to assess  Respiratory: breathing comfortably, lungs clear to auscultation with diminished breath sounds anteriorly, no rales or rhonchi, no wheezing, could not assess posteriorly due to her sleepiness, on 2L O2  Cardiac Auscultation: Regular rhythm, S1, S2, no S3 or S4, 2/6 murmur  CVC to right chest  Lower Extremity Edema: trace BLE edema to sock line  Abdominal Exam: soft, non-tender, bowel sounds normal  Orientation: usually clear historian, good insight, but today, very sleepy    Laboratory Review:   CBC w/Diff    Lab Results   Component Value Date/Time    WBC 10.70 05/10/2023 05:37 AM    RBC 3.36 (L) 05/10/2023 05:37 AM    HGB 9.6 (L) 05/10/2023 05:37 AM    HCT 30.2 (L) 05/10/2023 05:37 AM    MCV 89.9 05/10/2023 05:37 AM    MCH 28.6 05/10/2023 05:37 AM    MCHC 31.8 (L) 05/10/2023 05:37 AM    RDW 24.1 (H) 05/10/2023 05:37 AM    PLTCT 288 05/10/2023 05:37 AM    MPV 9.2 05/10/2023 05:37 AM    Lab Results   Component Value Date/Time    NEUT 70 05/10/2023 05:37 AM    ANC 7.50 (H) 05/10/2023 05:37 AM    LYMA 18 (L) 05/10/2023 05:37 AM    ALC 1.90 05/10/2023 05:37 AM    MONA 8 05/10/2023 05:37 AM    AMC 0.90 (H) 05/10/2023 05:37 AM    EOSA 3 05/10/2023 05:37 AM    AEC 0.30 05/10/2023 05:37 AM    BASA 1 05/10/2023 05:37 AM    ABC 0.10 05/10/2023 05:37 AM         Chemistry    Lab Results   Component Value Date/Time    NA 145 05/10/2023 05:37 AM    K 5.3 (H) 05/10/2023 05:37 AM    CL 105 05/10/2023 05:37 AM    CO2 24 05/10/2023 05:37 AM    GAP 16 (H) 05/10/2023 05:37 AM    BUN 48 (H) 05/10/2023 05:37 AM    CR 2.85 (H) 05/10/2023 05:37 AM    GLU 141 (H) 05/10/2023 05:37 AM    MG 1.9 05/06/2023 04:22 AM    Lab Results   Component Value Date/Time    CA 8.8 05/10/2023 05:37 AM    PO4 4.0 04/27/2023 06:29 PM    ALBUMIN 3.9 05/10/2023 05:37 AM    TOTPROT 6.2 05/10/2023 05:37 AM    ALKPHOS 105 05/10/2023 05:37 AM    AST 40 05/10/2023 05:37 AM    ALT 15 05/10/2023 05:37 AM    TOTBILI 0.9 05/10/2023 05:37 AM    GFR 17 (L) 05/10/2023 05:37 AM  Renal Function    Lab Results   Component Value Date/Time    NA 145 05/10/2023 05:37 AM    K 5.3 (H) 05/10/2023 05:37 AM    CL 105 05/10/2023 05:37 AM    CO2 24 05/10/2023 05:37 AM    GAP 16 (H) 05/10/2023 05:37 AM    BUN 48 (H) 05/10/2023 05:37 AM    BUN 39 (H) 05/06/2023 04:22 AM    BUN 36 (H) 05/05/2023 03:47 AM    Lab Results   Component Value Date/Time    CR 2.85 (H) 05/10/2023 05:37 AM    CR 2.70 (H) 05/06/2023 04:22 AM    CR 2.72 (H) 05/05/2023 03:47 AM    GLU 141 (H) 05/10/2023 05:37 AM    CA 8.8 05/10/2023 05:37 AM    PO4 4.0 04/27/2023 06:29 PM    ALBUMIN 3.9 05/10/2023 05:37 AM        Lipid Profile INR   Lab Results   Component Value Date    CHOL 107 09/16/2022    TRIG 72 09/16/2022    HDL 50 09/16/2022    LDL 45 09/16/2022    VLDL 14 09/16/2022    NONHDLCHOL 57 09/16/2022         Lab Results   Component Value Date    INR 1.4 (H) 05/04/2023          Chest X-Ray 1/13:  Moderate cardiomegaly with indistinct vasculature and slightly improved   perihilar and bibasilar opacities consistent with CHF/volume overload and   slightly improved pulmonary edema.       CT abdomen/pelvis 05/10/23:  1. Development of mild peripancreatic thickening/stranding that most   likely represents acute pancreatitis.   2. Moderate thickening/stranding has redeveloped adjacent to the region of   the gastric outlet and duodenum, which was also present in May 2024 and   which likely represents an additional manifestation of the pancreatitis.    Gastritis or duodenitis or less likely.   3. Development of mild interstitial edema in the lung bases. Additional   patchy interstitial and alveolar opacities that are new may represent   additional manifestations of edema or infectious pneumonitis.   4. Development of mild ill-defined stranding/edema within the pelvis   extraperitoneal spaces that either represents edema or inflammation.   5. Unchanged bilobed cystic lesion in the tail of the pancreas.   Additional, adjacent smaller cystic lesions and ductal dilation has   slightly decreased.        Finalized by Rosilyn Mings, M.D. on 05/10/2023 8:07 AM. Dictated by Rosilyn Mings,   M.D. on 05/10/2023 7:40 AM.     Tele:  SR, HR 80s    ECG: SR with 1st degree AV block, HR 84

## 2023-05-10 NOTE — H&P (View-Only)
General Medicine Service  Admission History and Physical Examination      Name:  Raven [name]                                             MRN:  [id number]   Admission Date:  05/10/2023                     Assessment/Plan:   Principal Problem:    UTI (urinary tract infection)      Raven [name] is a 78 y.o. female with history of  has a past medical history of Anemia, Anxiety, Arthritis, Asthma, Asymptomatic stenosis of right carotid artery, Atrial fibrillation (HCC), Back pain, Cancer of uterus (HCC), Chronic kidney disease, COPD (chronic obstructive pulmonary disease) (HCC), Coronary artery disease, Diabetes mellitus (HCC), Dyslipidemia, Flank mass, Fluid retention, GI bleed (03/24/2023), Heart disease, Heart failure (HCC), Hypertension, Multiple myeloma (HCC), OSA on CPAP, Other malignant neoplasm without specification of site, and Vision decreased. She presents with worsening dyspnea since her latest admission accompanied by increased Pro-BNP      Acute on chronic HFrEF  On chronic milrinone  Ischemic cardiomyopathy  CAD s/p CABG and PCI  CRT-D in situ  PAF  -Recent hospitalization 12/23-27 with HF exacerbation.   -Echo from 04/12/2023 revealed EF of 30% with grade 3 diastolic dysfunction and severe mitral and tricuspid valve regurgitation  -EKG revealed sinus rhythm with first-degree AV block and PVCs  -NT proBNP elevated at 12K. Has been >10k at baseline. Most recently 11,636 04/19/23  -Chest x-ray revealed pulmonary edema  -Suspect shortness of breath likely has a component of heart failure exacerbation  -PTA on torsemide 20 mg BID  Left ventricular systolic function is moderately to severely impaired with an estimate ejection fraction of 30% and global hypokinesis  Right ventricle is moderately dilated with normal function  The left atrium is severely dilated  Pacemaker lead seen in the right atrium and right ventricle  Severe mitral valve insufficiency  Severe tricuspid valve insufficiency  No pericardial effusion  Severe pulmonary hypertension with estimated peak systolic pulmonary artery pressure of 73 mmHg  Assessment & Plan  Acute pancreatitis  Pancreatic cyst  -CT abdomen/pelvis from 04/02/2023 revealed mild increase in size of indeterminant large lobulated cystic mass in the tail of the pancreas  -Endoscopic ultrasound recommended  -Follow-up with GI as outpatient  CT Abdomen/Pelvis 1/13: -Mild peripancreatic thickening/stranding that most likely represents acute pancreatitis. 2. Moderate thickening/stranding has redeveloped adjacent to the region of the gastric outlet and duodenum, which was also present in May 2024 and which likely represents an additional manifestation of the pancreatitis.  - no abdominal pain, and lipase is normal. Will defer treatment for pancreatitis at this time, in favor of treating suspected UTI and Acute on chronic HFrEF exacerbation.   UTI (urinary tract infection)  > urine culture  > continue ceftriaxone.    CKD stage IV  Plan  -Monitor renal function closely while diuresing.   -Daily CMP       Multiple myeloma  -Follows at [facility] and currently on Daratumumab  -Continue PTA acyclovir for prophylaxis     DM type II  -Most recent hemoglobin A1c from 04/20/2023 was 6.3  -Will start low-dose correction factor.  -Blood glucose under well control.     COPD  Pulmonary nodules  -Continue PTA albuterol  and Symbicort  -Follow-up for pulmonary nodules recommended after 6 to 8 weeks         FEN: no IVF, electrolytes stable, No diet orders on file   PPX: eliquis  Code Status: Full Code, confirmed with patient on admission.     Disposition: Admit to Medicine    Raven Celeste, MD  Internal Medicine      * Please Vo[employer]te message Med Private O2, First Call to be connected with the covering physician 24/7.     __________________________________________________________________________________  Primary Care Physician: Raven [name]  Verified    Chief Complaint:  shortness of breath    History of Present Illness:   The patient, Raven [name], presents to the hospital with a chief complaint of difficulty breathing and a heavy, continuous cough. She reports being diagnosed with RSV a couple of weeks ago. The patient has been experiencing shortness of breath since December 31st. She describes her cough as sometimes dry and sometimes productive, with white or green sputum. She denies any chest pain, pressure, squeezing, or tightness.    Raven [name] experiences increased shortness of breath upon walking, stating that she can only walk about 20 steps on a flat surface before becoming out of breath. She reports that sitting and resting alleviate her shortness of breath. The patient also mentions occasional diarrhea and bowel incontinence during coughing episodes, with the last occurrence a couple of nights ago. She denies any current stomach pain but has a history of pancreatitis and a family history of pancreatic cancer.        Past Medical History     Past Medical History:    Anemia    Anxiety    Arthritis    Asthma    Asymptomatic stenosis of right carotid artery    Atrial fibrillation (HCC)    Back pain    Cancer of uterus (HCC)    Chronic kidney disease    COPD (chronic obstructive pulmonary disease) (HCC)    Coronary artery disease    Diabetes mellitus (HCC)    Dyslipidemia    Flank mass    Fluid retention    GI bleed    Heart disease    Heart failure (HCC)    Hypertension    Multiple myeloma (HCC)    OSA on CPAP    Other malignant neoplasm without specification of site    Vision decreased       Past Surgical History     Surgical History:   Procedure Laterality Date    CORONARY ARTERY BYPASS GRAFT  2010    CABG 3 V    ROBOT ASSISTED LOW ANTERIOR RESECTION N/A 08/10/2022    Performed by Benetta Spar, MD at [facility] OR    ESOPHAGOGASTRODUODENOSCOPY WITH BIOPSY - FLEXIBLE N/A 09/17/2022    Performed by Tempie Hoist, DO at [facility] ENDO    CATHETERIZATION RIGHT HEART N/A 09/18/2022    Performed by Cath, Physician at [facility] CATH LAB    REMOVAL AND REPLACEMENT IMPLANTABLE DEFIBRILLATOR GENERATOR - SINGLE LEAD SYSTEM Left 11/04/2022    Performed by Kathreen Cornfield, MD at [facility] EP LAB    INSERTION/ REPLACEMENT PERMANENT PACEMAKER WITH ATRIAL LEAD Left 11/04/2022    Performed by Kathreen Cornfield, MD at [facility] EP LAB    Injection Venography Extremity Left 11/04/2022    Performed by Kathreen Cornfield, MD at [facility] EP LAB    ESOPHAGOGASTRODUODENOSCOPY WITH BIOPSY - FLEXIBLE N/A 03/29/2023    Performed by Buckles, Vinnie Level, MD at  [facility] ENDO    ESOPHAGOGASTRODUODENOSCOPY WITH SNARE REMOVAL TUMOR/ POLYP/ OTHER LESION - FLEXIBLE N/A 03/29/2023    Performed by Buckles, Vinnie Level, MD at [facility] ENDO    ESOPHAGOGASTRODUODENOSCOPY WITH CONTROL OF BLEEDING - FLEXIBLE N/A 04/04/2023    Performed by Buckles, Vinnie Level, MD at [facility] ENDO    COLONOSCOPY WITH BIOPSY - FLEXIBLE N/A 04/30/2023    Performed by Onnie Boer, MD at [facility] ENDO    BONE MARROW BIOPSY      CARDIAC DEFIBRILLATOR PLACEMENT      [employer]    CAROTID ENDARDECTOMY Right     COLONOSCOPY      HX CORONARY STENT PLACEMENT      HX HEART CATHETERIZATION      HX HYSTERECTOMY      TUNNELED VENOUS PORT PLACEMENT Right     Chest       Family History     Family History   Problem Relation Name Age of Onset    Cancer Mother      Diabetes Mother      Hypertension Mother      Cancer-Breast Sister      Cancer-Ovarian Sister      Cancer Sister      Diabetes Sister      Heart Disease Sister      Hypertension Sister      Heart Disease Brother      Hypertension Brother      Stroke Brother         Social History     Social History     Socioeconomic History    Marital status: Widowed   Tobacco Use    Smoking status: Former     Current packs/day: 0.00     Average packs/day: 1 pack/day for 20.0 years (20.0 ttl pk-yrs)     Types: Cigarettes     Start date: 24     Quit date: 2010     Years since quitting: 15.0     Passive exposure: Never    Smokeless tobacco: Never   Vaping Use    Vaping status: Never Used Substance and Sexual Activity    Alcohol use: Not Currently    Drug use: Not Currently        Immunizations (includes history and patient reported):   Immunization History   Administered Date(s) Administered    COVID-19 (MODERNA), mRNA vacc, 100 mcg/0.5 mL (PF) 06/28/2019, 07/26/2019, 02/26/2020           Allergies:  Bortezomib, Allopurinol, and Sulfa (sulfonamide antibiotics)    Medications Prior to Admission:  Prior to Admission Medications   Prescriptions Last Dose Informant Patient Reported? Taking?   EMU OIL (BULK) MISC Past Week Self Yes Yes   Sig: Use  as directed. Apply topically as needed   acetaminophen (TYLENOL) 325 mg tablet Past Week Self No Yes   Sig: Take three tablets by mouth every 8 hours as needed for Pain. Indications: pain   acyclovir (ZOVIRAX) 400 mg tablet 05/09/2023 Evening Self Yes Yes   Sig: Take one tablet by mouth twice daily.   albuterol sulfate (PROAIR HFA) 90 mcg/actuation HFA aerosol inhaler Past Week Self Yes Yes   Sig: Inhale two puffs by mouth into the lungs every 6 hours as needed for Wheezing or Shortness of Breath.   amiodarone (CORDARONE) 200 mg tablet 05/09/2023 Morning Self No Yes   Sig: Take one tablet by mouth daily. Indications: prevention of recurrent atrial fibrillation   apixaban (ELIQUIS) 5 mg  tablet 05/09/2023 Evening Self No Yes   Sig: Take one tablet by mouth twice daily. Indications: afib   budesonide-formoterol HFA (SYMBICORT) 160-4.5 mcg/actuation aerosol inhaler 05/09/2023 Evening Self No Yes   Sig: Inhale two puffs by mouth into the lungs twice daily. Indications: bronchospasm prevention with COPD   cholecalciferol (VITAMIN D) 1,000 units tablet 05/09/2023 Morning Self Yes Yes   Sig: Take one tablet by mouth daily.   cyanocobalamin (VITAMIN B-12) 1,000 mcg tablet 05/09/2023 Morning Self Yes Yes   Sig: Take 5,000 Units by mouth daily.   dexAMETHasone (DECADRON) 4 mg tablet Not Taking Self Yes No   Sig: Take two tablets by mouth every 7 days. Patient is taking 8mg  on Thursdays prior to chemo   Patient not taking: Reported on 05/10/2023   dextromethorphan-guaiFENesin (ROBITUSSIN-DM) 10-100 mg/5 mL oral syrup Past Month Self No Yes   Sig: Take 10 mL by mouth every 4 hours as needed. Indications: cough   febuxostat (ULORIC) 40 mg tablet 05/09/2023 Morning Self No Yes   Sig: Take one-half tablet by mouth daily. Indications: taken on chemo days   ferrous sulfate (FEOSOL) 325 mg (65 mg iron) tablet 05/09/2023 Morning Self Yes Yes   Sig: Take one tablet by mouth daily. Take on an empty stomach at least 1 hour before or 2 hours after food.   magnesium oxide (MAGOX) 400 mg (241.3 mg magnesium) tablet 05/09/2023 Bedtime Self No Yes   Sig: Take one tablet by mouth at bedtime daily. Indications: low amount of magnesium in the blood   milrinone (PRIMACOR) 200 mcg/mL infusion 05/10/2023 Morning Self No Yes   Sig: 0.3 mcg/kg/min ? 78.3 kg continuous  Indications: sudden and serious symptoms of heart failure called acute decompensated heart failure   nitroglycerin (NITROSTAT) 0.4 mg tablet Unknown Self Yes Yes   Sig: Place one tablet under tongue every 5 minutes as needed for Chest Pain.   pantoprazole DR (PROTONIX) 40 mg tablet 05/09/2023 Evening Self No Yes   Sig: Take one tablet by mouth twice daily. Indications: gastritis   potassium chloride SR (K-DUR) 20 mEq tablet 05/09/2023 Evening Self No Yes   Sig: Take two tablets by mouth twice daily. Indications: low amount of potassium in the blood   rosuvastatin (CRESTOR) 20 mg tablet 05/09/2023 Morning Self No Yes   Sig: Take one-half tablet by mouth daily. Indications: excessive fat in the blood   sertraline (ZOLOFT) 50 mg tablet 05/09/2023 Morning Self Yes Yes   Sig: Take one tablet by mouth daily.   torsemide (DEMADEX) 20 mg tablet 05/09/2023 Morning Self No Yes   Sig: Take two tablets by mouth twice daily. Indications: accumulation of fluid resulting from chronic heart failure      Facility-Administered Medications: None       Review of Systems System  Positives  Negatives    Constitutional:   Fever, chills, unintentional weight loss   Eyes:   Diplopia, blurry vision   ENT:   Congestion   Respiratory:  Shortness of breath Shortness of breath, cough    Cardiovascular:   Chest pain, palpitations, claudication    Gastrointestinal:   Nausea, vomiting, abdominal pain, hematemesis, melena   Genitourinary:   Dysuria    Hematologic:   Bleeding dyscrasias, easy bruising    Musculoskeletal:   Arthralgias, myalgias    Neurological:   Headache, numbness, tingling, loss of consciousness, seizures, tremors   Behavioral/Psych:   Suicidal ideation, hallucinations, delusions, mood swings   Skin:  Rash, itching   Immunologic:  Joint swelling   Endocrine:  No polyphagia, polydipsia       Physical Exam   Vital signs:  BP: 107/69 (01/13 0930)  Temp: 36.5 ?C (97.7 ?F) (01/13 0455)  Pulse: 81 (01/13 0930)  Respirations: 21 PER MINUTE (01/13 0930)  SpO2: 100 % (01/13 0930)  O2 Device: None (Room air) (01/13 0700)  Height: 157.5 cm (5' 2) (01/13 0455)  BP Readings from Last 6 Encounters:   05/10/23 107/69   05/06/23 110/46   04/23/23 108/47   04/15/23 113/55   04/09/23 111/62   03/31/23 101/55     Wt Readings from Last 3 Encounters:   05/10/23 72.1 kg (159 lb)   05/06/23 74.2 kg (163 lb 9.6 oz)   04/23/23 76 kg (167 lb 9.6 oz)       General: 78 y.o. female, cooperative, no distress, appears stated age  Head: atraumatic  Eyes: Conjunctivae/corneas clear. PERRL, EOMs intact.  ENT: No gross deformities. Mucous membranes moist. Pharynx non-erythematous  Neck: Supple, symmetrical, trachea midline, no adenopathy, thyroid not enlarged, no carotid bruit and no JVD  Back: Symmetric, no curvature, ROM normal.  No CVA tenderness.  Lungs: bilateral lower lung field rales  Chest wall: No tenderness or deformity.  Heart: Regular rate and rhythm, no murmur  Abdomen: Soft, non-tender. Bowel sounds normal. No masses. No organomegaly.  Extremities: 2+ lower extremity edema  Skin: warm, dry. No rashes or lesions  Neurologic: Alert, oriented x 3. Strength and sensation grossly conserved      Lab/Radiology/Other Diagnostic Tests:  24-hour labs:    Results for orders placed or performed during the hospital encounter of 05/10/23 (from the past 24 hours)   CBC AND DIFF    Collection Time: 05/10/23  5:37 AM   Result Value Ref Range    White Blood Cells 10.70 4.50 - 11.00 10*3/uL    Red Blood Cells 3.36 (L) 4.00 - 5.00 10*6/uL    Hemoglobin 9.6 (L) 12.0 - 15.0 g/dL    Hematocrit 16.1 (L) 36.0 - 45.0 %    MCV 89.9 80.0 - 100.0 fL    MCH 28.6 26.0 - 34.0 pg    MCHC 31.8 (L) 32.0 - 36.0 g/dL    RDW 09.6 (H) 04.5 - 15.0 %    Platelet Count 288 150 - 400 10*3/uL    MPV 9.2 7.0 - 11.0 fL    Neutrophils 70 41 - 77 %    Lymphocytes 18 (L) 24 - 44 %    Monocytes 8 4 - 12 %    Eosinophils 3 0 - 5 %    Basophils 1 0 - 2 %    Absolute Neutrophil Count 7.50 (H) 1.80 - 7.00 10*3/uL    Absolute Lymph Count 1.90 1.00 - 4.80 10*3/uL    Absolute Monocyte Count 0.90 (H) 0.00 - 0.80 10*3/uL    Absolute Eosinophil Count 0.30 0.00 - 0.45 10*3/uL    Absolute Basophil Count 0.10 0.00 - 0.20 10*3/uL    MDW (Monocyte Distribution Width) 24.2 (H) <=20.6   COMPREHENSIVE METABOLIC PANEL    Collection Time: 05/10/23  5:37 AM   Result Value Ref Range    Sodium 145 137 - 147 mmol/L    Potassium 5.3 (H) 3.5 - 5.1 mmol/L    Chloride 105 98 - 110 mmol/L    Glucose 141 (H) 70 - 100 mg/dL    Blood Urea Nitrogen 48 (H) 7 - 25 mg/dL    Creatinine 4.09 (H) 0.40 - 1.00 mg/dL  Calcium 8.8 8.5 - 10.6 mg/dL    Total Protein 6.2 6.0 - 8.0 g/dL    Total Bilirubin 0.9 0.2 - 1.3 mg/dL    Albumin 3.9 3.5 - 5.0 g/dL    Alk Phosphatase 161 25 - 110 U/L    AST 40 7 - 40 U/L    ALT 15 7 - 56 U/L    CO2 24 21 - 30 mmol/L    Anion Gap 16 (H) 3 - 12    Glomerular Filtration Rate (GFR) 17 (L) >60 mL/min   NT-PRO-BNP    Collection Time: 05/10/23  5:37 AM   Result Value Ref Range    NT-Pro-BNP 15,065 (H) <450 pg/mL   HIGH SENSITIVITY TROPONIN I 0 HOUR    Collection Time: 05/10/23  5:37 AM   Result Value Ref Range    hs Troponin I 0 Hour 49 (H) <15 ng/L   LIPASE    Collection Time: 05/10/23  5:37 AM   Result Value Ref Range    Lipase 17 11 - 82 U/L   URINALYSIS DIPSTICK REFLEX TO CULTURE    Collection Time: 05/10/23  6:12 AM    Specimen: Midstream; Urine   Result Value Ref Range    Color,UA Amber     Turbidity,UA 2+ (A) Clear    Specific Gravity-Urine 1.018 1.005 - 1.030    pH,UA 5.0 5.0 - 8.0    Protein,UA 2+ (A) Negative    Glucose,UA 1+ (A) Negative    Ketones,UA Negative Negative    Bilirubin,UA Negative Negative    Blood,UA 3+ (A) Negative    Urobilinogen,UA Normal Normal    Nitrite,UA Negative Negative    Leukocytes,UA 3+ (A) Negative   URINALYSIS MICROSCOPIC REFLEX TO CULTURE    Collection Time: 05/10/23  6:12 AM    Specimen: Midstream; Urine   Result Value Ref Range    WBCs,UA Packed (A) None, 0 - 2  /HPF    RBCs,UA Packed (A) None, 0 - 2  /HPF    Mucous,UA Trace None, Trace /LPF    WBC Clumps Present (A) None /HPF    Squamous Epithelial Cells 5 - 10 (A) None, 0 - 2 , 2 - 5 /HPF   HIGH SENSITIVITY TROPONIN I 2 HOUR    Collection Time: 05/10/23  8:16 AM   Result Value Ref Range    hs Troponin I 2 Hour 40 (H) <15 ng/L    hs Troponin I 2-0hr Delta Value -9      Glucose: (!) 141 (05/10/23 0537)  Pertinent radiology personally viewed.    CT ABD/PELV WO CONTRAST    Result Date: 05/10/2023  1. Development of mild peripancreatic thickening/stranding that most likely represents acute pancreatitis. 2. Moderate thickening/stranding has redeveloped adjacent to the region of the gastric outlet and duodenum, which was also present in May 2024 and which likely represents an additional manifestation of the pancreatitis.  Gastritis or duodenitis or less likely. 3. Development of mild interstitial edema in the lung bases. Additional patchy interstitial and alveolar opacities that are new may represent additional manifestations of edema or infectious pneumonitis. 4. Development of mild ill-defined stranding/edema within the pelvis extraperitoneal spaces that either represents edema or inflammation. 5. Unchanged bilobed cystic lesion in the tail of the pancreas. Additional, adjacent smaller cystic lesions and ductal dilation has slightly decreased.  Finalized by Rosilyn Mings, M.D. on 05/10/2023 8:07 AM. Dictated by Rosilyn Mings, M.D. on 05/10/2023 7:40 AM.    CHEST SINGLE VIEW    Result  Date: 05/10/2023  Moderate cardiomegaly with indistinct vasculature and slightly improved perihilar and bibasilar opacities consistent with CHF/volume overload and slightly improved pulmonary edema.  Finalized by Jason Coop, M.D. on 05/10/2023 5:52 AM. Dictated by Jason Coop, M.D. on 05/10/2023 5:50 AM.      Eyvonne Mechanic, MD

## 2023-05-10 NOTE — Consults
complete

## 2023-05-10 NOTE — ED Notes
Patient to ED from home via daughter. Reports not feeling well x1 day. +dry cough. +shortness of breath. +nausea/vomiting. +dysuria. Patient alert, orientated x4. Respirations even and non labored. Calm and cooperative. Ambulatory with assistance. Call light in reach.

## 2023-05-10 NOTE — ED Notes
Message sent to inpatient team regarding pt keeping her scopolamine patch on per pt request.

## 2023-05-10 NOTE — Case Management (ED)
Case Management Progress Note    Palliative Care    NAME:Raven [name]                          MRN: [id number]              DOB:January 05, 1946          AGE: 78 y.o.  ADMISSION DATE: 05/10/2023             DAYS ADMITTED: LOS: 0 days      Today's Date: 05/10/2023    PLAN: Patient hopeful to DC return home when medically stable.     Expected Discharge Date:    Is Patient Medically Stable: No, Please explain: ED admit  Are there Barriers to Discharge? no    INTERVENTION/DISPOSITION:  Discharge Planning                 Transportation              Does the Patient Need Case Management to Arrange Discharge Transport? (ex: facility, ambulance, wheelchair/stretcher, Medicaid, cab, other): No  Will the Patient Use Family Transport?: Yes  Support                   Palliative team rounded on Patient in ER - Patient resting in bed, with obvious cough.  Patient reviewing recent medical Hx with team, including multiple hospitalizations (6 in 6 weeks).  Patient confirms residing at home with appropriate support from her son, DIL and daughter.  Patient confirms she feels better after discharge and finds benefit in return to hospital.  Patient in agreement with medications to address symptoms  No changes in care plan today.  Palliative team will continue to follow.    Info or Referral                 Positive SDOH Domains and Potential Barriers                   Medication Needs                                  Financial                 Legal                 Other                 Discharge Disposition                                                                                                                                                      Selected Continued Care - Admitted Since 05/10/2023    No services have been selected  for the patient.         Garnet Sierras, LMSW  [employer]  Pager: [phone]  Office: [phone]

## 2023-05-10 NOTE — Progress Notes
RT Adult Assessment Note    NAME:Raven [name]             MRN: [id number]             DOB:June 09, 1945          AGE: 78 y.o.  ADMISSION DATE: 05/10/2023             DAYS ADMITTED: LOS: 0 days    Additional Comments:  Impressions of the patient: Patient resting and in no respiratory distress at this time.   Intervention(s)/outcome(s): none  Patient education that was completed: none  Recommendations to the care team: none    Vital Signs:  Pulse: 86  RR:  16  SpO2:  98%  O2 Device: CPAP/BiPAP  Liter Flow: 3 Lpm  O2%:      All Breath Sounds: Clear (Implies normal);Decreased  Respiratory Effort/Pattern: Unlabored  Comments:

## 2023-05-10 NOTE — Progress Notes
Home Infusion Transfer Summary - to Receiving Agency:  [facility]     Learned on 05/10/23 that patient was hospitalized on 05/10/23  for shortness of air, cough, nausea, dysuria      [facility] has been providing the following treatment to the patient:     Drug:  milrinone 0.3 mcg/kg/min at 78.3 kg IV via continuous infusion        Administration method:  CADD [employer] ambulatory pump  IV Access:  SL CVC/SL Port  Last dressing change:  n/a  Nursing provided by:  [facility]; Ph: [phone]  Last visit:  unknown     Progress toward goals: Patient has been on service with [facility] for home inotrope since May 2024. Patient has had repeated admission in the last month   11/27-12/4  12/6-12/13  12/15-12/19  12/23-12/27  12/31-1/9  1/13- TBD     Milrinone infuses via CVC and port is utilized for lab draws by [facility] RN.      Community Resources:  home health listed above     When the patient is prepared to return home, Emogene Morgan will be able to resume care once we receive physician orders specific to the care that is to resume.       [facility] - Phone: [phone]; Fax: [phone]

## 2023-05-11 ENCOUNTER — Encounter: Admit: 2023-05-11 | Discharge: 2023-05-11 | Payer: MEDICARE

## 2023-05-11 LAB — MAGNESIUM: ~~LOC~~ BKR MAGNESIUM: 1.7 mg/dL (ref 1.6–2.6)

## 2023-05-11 LAB — POC GLUCOSE
~~LOC~~ BKR POC GLUCOSE: 176 mg/dL — ABNORMAL HIGH (ref 70–100)
~~LOC~~ BKR POC GLUCOSE: 161 mg/dL — ABNORMAL HIGH (ref 70–100)
~~LOC~~ BKR POC GLUCOSE: 210 mg/dL — ABNORMAL HIGH (ref 70–100)
~~LOC~~ BKR POC GLUCOSE: 226 mg/dL — ABNORMAL HIGH (ref 70–100)

## 2023-05-11 LAB — CULTURE-URINE W/SENSITIVITY: ~~LOC~~ BKR URINE CULTURE: <10

## 2023-05-11 MED ORDER — FLU VACC TS2024-25(65YR UP)-PF 180 MCG/0.5 ML IM SYRG
.5 mL | Freq: Once | INTRAMUSCULAR | 0 refills | Status: CP
Start: 2023-05-11 — End: ?
  Administered 2023-05-12: 18:00:00 0.5 mL via INTRAMUSCULAR

## 2023-05-11 MED ORDER — BUMETANIDE 0.25 MG/ML IJ SOLN
3 mg | Freq: Two times a day (BID) | INTRAVENOUS | 0 refills | Status: DC
Start: 2023-05-11 — End: 2023-05-11

## 2023-05-11 MED ORDER — LIDOCAINE 5 % TP PTMD
1 | Freq: Every day | TOPICAL | 0 refills | Status: DC
Start: 2023-05-11 — End: 2023-05-15
  Administered 2023-05-11 – 2023-05-13 (×2): 1 via TOPICAL

## 2023-05-11 MED ORDER — METOLAZONE 2.5 MG PO TAB
2.5 mg | Freq: Once | ORAL | 0 refills | Status: DC
Start: 2023-05-11 — End: 2023-05-11

## 2023-05-11 NOTE — Progress Notes
PALLIATIVE CARE INPATIENT NOTE     Name: Raven [name]            MRN: [id number]                DOB: 07/19/1945          Age: 78 y.o.  Admission Date: 05/10/2023             LOS: 1 day    ASSESSMENT/PLAN     Raven [name] is a 78 y.o. 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 Frequent hospitalizations here with worsening cough and SOA. Palliative Care consulted for complex medical decision making.     #Acute on chronic HFrE,  EF: 30%   - She follows with Dr. Herma Carson. [name] and [name], APRN  #Chronic cardiogenic shock on chronic milrinone  #Ischemic cardiomyopathy  #CAD s/p CABG and PCI  #CRT-D in situ  #PAF  #Acute pancreatitis/Pancreatic cystic mass   #UTI  #Multiple myeloma   - She follows with [facility] and currently on Daratumumab     #Goals of Care/Complex Medical Decision Making related to Heart Failure  Discussion:   Palliative team met with patient today. No family at bedside. She is sitting up in chair. Reports her cough is bothersome. Worse with lying flat or lying down, improves some with sitting up. Still has a cough with sitting up though and reports it is non-productive. Her abdominal wall feels sore after coughing fit. It takes her awhile to catch her breath    RECOMMENDATIONS:   -Her short term goal is that her symptoms improve during this hospital stay so she can return home. She still finds benefit from hospitalizations  Noted plan for RHC tomorrow  We will continue to follow   Continue cough regimen of mucinex, robitussin, tessalon pearles  Referral placed for palliative HF clinic - Lillia Corporal APRN    PALLIATIVE CARE PLANNING     Advance Care Planning:   Identified Health Care Decision Maker -  Son [place], [place], Daughter [name]   DPOA - Would benefit from completion of DPOA  TPOPP/OSHDNAR - Not discussed    PC Clinic - Recommend outpatient Palliative Care follow up - could consider a Palliative Care HF clinic referral with [name], NP.     Medication safety - NA    Disposition planning - Too soon to determine - PTA [facility] and milrinone infusion through [facility]     SUBJECTIVE     CC/Reason for Visit:goals, cough  Additional history from: chart    Interval Hx: Palliative team met with patient today. No family at bedside. She is sitting up in chair. Reports her cough is bothersome. Worse with lying flat or lying down, improves some with sitting up. Still has a cough with sitting up though and reports it is non-productive. Her abdominal wall feels sore after coughing fit. It takes her awhile to catch her breath      OBJECTIVE     Blood pressure 110/71, pulse 81, temperature 36.9 ?C (98.5 ?F), height 157.5 cm (5' 2), weight 73.4 kg (161 lb 12.8 oz), SpO2 100%.  Physical Exam  General: alert,  sitting up in chair, appears chronically ill, appears tired  HEENT: Eyes open, non-icteric  CV: Regular rate  Pulm: Non-labored. she has air movement in bilateral bases, no wheezes noted  GI: Abd non-distended, non-tender to palpation, bowel sounds active  Neuro: Alert, oriented x 4, answering questions appropriately, no seizures, no myoclonus  Psych: Calm    Lab Results:  CBC   Lab Results   Component Value Date/Time    WBC 10.20 05/11/2023 07:07 AM    HGB 8.8 (L) 05/11/2023 07:07 AM    PLTCT 257 05/11/2023 07:07 AM     Lab Results   Component Value Date/Time    NEUT 66 05/11/2023 07:07 AM    ANC 6.70 05/11/2023 07:07 AM      Chemistries   Lab Results   Component Value Date/Time    NA 143 05/11/2023 07:07 AM    K 5.1 05/11/2023 07:07 AM    BUN 56 (H) 05/11/2023 07:07 AM    CR 3.38 (H) 05/11/2023 07:07 AM    GLU 154 (H) 05/11/2023 07:07 AM     Lab Results   Component Value Date/Time    CA 9.0 05/11/2023 07:07 AM    PO4 4.0 04/27/2023 06:29 PM    ALBUMIN 3.4 (L) 05/11/2023 07:07 AM    TOTPROT 5.8 (L) 05/11/2023 07:07 AM    ALKPHOS 101 05/11/2023 07:07 AM    AST 75 (H) 05/11/2023 07:07 AM    ALT 41 05/11/2023 07:07 AM    TOTBILI 0.9 05/11/2023 07:07 AM    GFR 13 (L) 05/11/2023 07:07 AM        Other Pertinent Diagnostic Results:           Palliative Care Data:  Patient Location at Time of Consultation: Hospital - General floor (includes step-down,  pre-op)  Primary Diagnosis: Cardiovascular: Heart Failure    Moderate medical decision making due to the following:  1 or more chronic illnesses with exacerbation, progression, or side effects of treatment  Review of notes outside of my specialty and Review of each unique test

## 2023-05-11 NOTE — Progress Notes
Pharmacy High Risk Medication Review    A high risk medication review has been performed for Mardene Celeste by a pharmacy team member.      Medication class(es) to review on inpatient med list:  Anticholinergic medication (ex. diphenhydramine)      The medications class(es) are noted to be high risk to mobility or mentation in patients >78 years of age. If your patient has been CAM positive (diagnosed with delirium) at any point during this hospitalization then it would be particularly helpful to review these medications. Each medication has its own unique risks and benefits to the patient, we ask that these medications are reviewed and considered for de-prescribing where appropriate.     Consider reviewing [email or web address] for anticholinergic burden calculation.  If you need further assistance identifying or de-prescribing, please contact pharmacy.      Current Inpatient Medications:  Scheduled Meds:acyclovir (ZOVIRAX) tablet 400 mg, 400 mg, Oral, BID  albuterol sulfate (PROAIR HFA) inhaler 2 puff, 2 puff, Inhalation, BID & PRN  amiodarone (CORDARONE) tablet 200 mg, 200 mg, Oral, QDAY  [Held by Provider] apixaban (ELIQUIS) tablet 5 mg, 5 mg, Oral, BID  benzonatate (TESSALON PERLES) capsule 100 mg, 100 mg, Oral, TID  budesonide-formoterol HFA (SYMBICORT) 160-4.5 mcg/actuation inhalation 2 puff, 2 puff, Inhalation, BID  cefTRIAXone (ROCEPHIN) IVP 1 g, 1 g, Intravenous, Q24H*  CHOLEcalciferoL (vitamin D3) tablet 1,000 Units, 1,000 Units, Oral, QDAY  cyanocobalamin (vitamin B-12) tablet 5,000 mcg, 5,000 mcg, Oral, QDAY  [START ON 05/13/2023] dexAMETHasone (DECADRON) tablet 8 mg, 8 mg, Oral, Q7 Days  ferrous sulfate (FEOSOL) tablet 325 mg, 325 mg, Oral, QDAY  insulin aspart (U-100) (NOVOLOG FLEXPEN U-100 INSULIN) injection PEN 0-6 Units, 0-6 Units, Subcutaneous, ACHS (22)  magnesium oxide (MAGOX) tablet 400 mg, 400 mg, Oral, QHS  pantoprazole DR (PROTONIX) tablet 40 mg, 40 mg, Oral, BID  rosuvastatin (CRESTOR) tablet 10 mg, 10 mg, Oral, QDAY  scopolamine (TRANSDERM-SCOP) 1mg  over 3 days patch 1 patch, 1 patch, Transdermal, Q72H*   And  Verification of Patch Placement and Integrity - Scopolamine base 1 mg/3 days 1 patch, 1 patch, Transdermal, BID  sertraline (ZOLOFT) tablet 50 mg, 50 mg, Oral, QDAY    Continuous Infusions:   milrinone (PRIMACOR) 20 mg/D5W 100 mL infusion 0.3 mcg/kg/min (05/11/23 0006)     PRN and Respiratory Meds:acetaminophen Q8H PRN, dextromethorphan-guaiFENesin Q4H PRN, dextrose 50% (D50) IV PRN, eucalyptus-menthoL Q2H PRN, melatonin QHS PRN, nitroglycerin Q5 MIN PRN, ondansetron Q6H PRN **OR** ondansetron (ZOFRAN) IV Q6H PRN, polyethylene glycol 3350 QDAY PRN, sennosides-docusate sodium QDAY PRN        Thank you,  Rodman Comp, PHARMD  05/11/2023

## 2023-05-11 NOTE — Progress Notes
Heart Failure Progress Note    NAME:Raven [name]             MRN: [id number]                 DOB:04/22/1946          AGE: 78 y.o.  ADMISSION DATE: 05/10/2023             DAYS ADMITTED: LOS: 1 day      Principal Problem:    Acute pancreatitis  Active Problems:    Multiple myeloma (HCC)    Pancreatic cyst    Receiving inotropic medication    S/P drug eluting coronary stent placement    Type 2 diabetes mellitus without complications (HCC)    Chronic kidney disease, stage 4 (severe) (HCC)    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    UTI (urinary tract infection)      Raven [name] is a 78 y.o. 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     She follows with Dr. Herma Carson. [name] and [name], APRN.    She presented to the ED on 05/10/23 with complaints of not feeling well, dyspnea, dry cough, nausea/vomiting and dysuria.  Her NT-pro BNP was elevated at 15065 (previously 11379) and her troponins were slightly elevated at 49>40>39.  Her UA was suspicious for UTI.  CXR revealed volume overload with slightly improved pulmonary edema.  Her creatinine was slightly elevated from her baseline as well.      Unfortunately, this is her 6th hospitalization over the last 6 weeks and 9th admission over the past 9 months.   Heart Failure was consulted to assist with management.    Admitted from 11/27-12/4/24 with melena and anemia, received PRBCs and underwent EGD on 12/2 which revealed 1.5 cm pedunculated viable polyp in duodenum which was clipped, Aspirin and Plavix were discontinued and she remained on Eliquis for anticoagulation.  Admitted from 12/6-12/13/24 with hematemesis, melena, symptomatic anemia.  Although her Plavix and aspirin had been discontinued during prior admission, pt reported she continued taking both aspirin and Plavix along with her Eliquis.  She required x4 units of PRBCs. Repeat EGD on 12/8 showed ulcer in duodenum and visible vessel with no active bleeding which was injected with epinephrine and Endo Clip placed.  Admitted from 12/15-12/19/24 for decompensated heart failure, required IV diuresis. It was noted that her milrinone infusion had been off for unknown amount of time.  For ongoing anemia she required x1 unit of PRBCs.  Admitted from 12/23-12/27/24 after she presented to HF clinic reporting symptoms of shortness of breath, concerns of hypervolemia and ~10 lbs weight gain.  She received IV diuresis, had developed a cough which was later identified as RSV when she was seen by her local ED.  Admitted from 12/31-05/06/23 after presenting to the ED with complaints of dyspnea and hemotochezia.  She was found to have RSV.  She required IV bumex with metolazone for diuresis.  She underwent colonoscopy for workup of hematochezia.  She was discharged home on torsemide 40mg  po BID.    She reports that she  starting feeling poorly on the day prior to admission with increased shortness of breath and cough.  She has been weighing herself and her home weights have been between 159-160 pounds.  She reports she has been drinking and eating normally at home, and she has not missed any medications.  Her milrinone is infusing without difficulty.       Recommendations:  Chronic cardiogenic shock: continue home milrinone 0.61mcg/kg/min  Diuretics: hold diuretics today with bump in creatinine  Hold eliquis starting tonight for RHC  Clear liquids after midnight and then NPO after 9am tomorrow  RHC tomorrow   Appreciate Palliative care consult's assistance: patient content with hospitalizations and wants aggressive care - will need follow-up with [name] in the HF clinic   HFrEF GDMT: limited with CKD and hx cardiogenic shock. Toprol XL stopped during hospitalization 12/23-12/27. Will not resume at this time. Previously on [name] which was recently dc'd, defer resumption at this time with reduced creatinine clearance.  Paroxsymal AFib: continue PTA Amiodarone 200 mg daily.  Hold PTA eliquis for RHC tomorrow and will resume once RHC completed.  Messaged Watchman navigator 04/29/23 to schedule appointment to discuss Watchman outpatient. [email or web address]   CAD: continue PTA Rosuvastatin 10 mg daily, not on aspirin with recent GIB  Pancreatic cyst: further workup as an outpatient per GI  Heart Failure will continue to follow      Ongoing:  BMP once a day. Mg level daily. Maintain K+ over 4.0 and Mg+ over 2.0   2000mg  sodium dietary restriction.  Fluid Restriction:2L  Strict I/O. Goal output: net evenL/24 hour  Daily standing scale weight. Goal Dry Weight: 162-163 pounds        Primary team responsible for placing Post-Discharge Health System Appointment Request order set for Cardiology: Heart Failure appointment request within 24-48 hours of discharge. (Please do not place order any earlier in effort to reduce possible need for cancellation and rescheduling of visit).      Pt examined and discussed with Dr. [name], heart failure attending.  Discussed with Dr. [name], primary team      [name], APRN-NP  Available on Voalte  Heart Failure Consult Service      Assessment:   Acute on chronic systolic and diastolic HFrEF,  EF: 30%.  Ischemic Cardiomyopathy  Major Complications or Comorbidities [facility]): cardiogenic shock, acute/ acute on chronic systolic and/or diastolic heart failure, and cardiorenal syndrome (with documentation of CKD)  NYHA functional class III (marked limitation of physical activity - comfortable at rest, but less than ordinary activity causes symptoms of HF e.g., getting dressed or standing from a sitting position),   ACC Stage D (refractory HF requiring specialized interventions).   She presents with signs of hypervolemia with left  ventricular failure with signs of low flow state.  BNP Labs:   Lab Results   Component Value Date    NTPROBNP 15,065 (H) 05/10/2023     High Sensitivity Troponin:  Lab Results   Component Value Date    HSTROP0HR 49 (H) 05/10/2023    HSTROP2HR 40 (H) 05/10/2023    HSTROP4HR 39 (H) 05/10/2023    HSTROPDELT4 -1 05/10/2023    HSTROPDELTA -9 05/10/2023    HIGHSTROPI 28 (H) 04/12/2023         Admission Weight: 72.1 kg (159 lb)        Most recent weights (inpatient):   Vitals:    05/10/23 0455 05/10/23 1209 05/11/23 0404   Weight: 72.1 kg (159 lb) 72.5 kg (159 lb 13.3  oz) 73.1 kg (161 lb 3.2 oz)        Strict I/Os:   net I/O for entire hospital stay: - (not complete)  net for last 24 hrs: -  UOP last 24 hrs:      PLAN:  Diuretic Therapy    Prior to admission dose    Torsemide 40mg  po BID   Given on admission    1/13: lasix 80mg  IV x 1; bumex 3mg  IV BID   Daily Dosing   1/14: hold with bump in creatinine     Guideline Directed Medical Therapy PTA Inpatient Changes   ACEI/ARB N/A-CKD IV     ARNI N/A-CKD IV     BB N/A-cardiogenic shock on home inotrope; previously tolerated Toprol XL which was discontinued recently    SGLT-2 Inhibitor None with CKD (dc'd mid-December), now with UTI    Mineralocorticoid Receptor Antagonist N/A-CKD IV     Hydralazine/Nitrate none    Ivabradine none    Heart Rhythm Management Therapy Yes (ICD)     Anticoagulation for Afib/flutter Elqiuis 5mg  po BID continued   Cardiac Rehab Evaluation for LVEF < or equal to 40% Recently ordered 04/12/23    7-Day post hospital follow up scheduled within 24-48 hours of discharge       Inotrope Therapy Dose & Adjustments  (document symptom and hemodynamic response if pertinent)   Milrinone 0.3 mcg/kg/min  Started during hospitalization May 2024        Testing/Procedures 04/12/23 Echo:  Eccentric hypertrophy of the left ventricle with severe dilatation of the left ventricular chamber  Left ventricular systolic function is moderately to severely impaired with an estimate ejection fraction of 30% and global hypokinesis  Right ventricle is moderately dilated with normal function  The left atrium is severely dilated  Pacemaker lead seen in the right atrium and right ventricle  Severe mitral valve insufficiency  Severe tricuspid valve insufficiency  No pericardial effusion  Severe pulmonary hypertension with estimated peak systolic pulmonary artery pressure of 73 mmHg     RHC 09/18/22 (weight 165 pounds)  RA 13  RV 70/12  PA 72/27 with mean 47  PCWP 17  TPG 30  PVR 12  CO/CI 2.5/1.4 by thermo  CO/CI 1.9/1.1 by Fick      CAD s/p CABG x3 (2010)  > cont statin therapy; aspirin and Plavix recently dc'd with GIB     S/p ICD  - ICD interrogation 04/29/23: AP <1%/VP <1% with presenting rhythm AS-VS 90s with elevated HF diagnostics    Hyperlipidemia  Lab Results   Component Value Date/Time    CHOL 107 09/16/2022 07:02 PM    TRIG 72 09/16/2022 07:02 PM    HDL 50 09/16/2022 07:02 PM    LDL 45 09/16/2022 07:02 PM    VLDL 14 09/16/2022 07:02 PM    NONHDLCHOL 57 09/16/2022 07:02 PM   > cont PTA Rosuvastatin 10 mg/day     CKD  - baseline creatinine: 2.4-2.6  - admission creatinine 1/13: 2.85  - recent creatinine trends:  Lab Results   Component Value Date/Time    CR 3.38 (H) 05/11/2023 07:07 AM    CR 2.85 (H) 05/10/2023 05:37 AM    CR 2.70 (H) 05/06/2023 04:22 AM    CR 2.72 (H) 05/05/2023 03:47 AM    CR 2.43 (H) 05/04/2023 06:17 AM   > daily labs    Acute pancreatitis  Chronic Anemia  Frequent GIB  Pancreatic cystic mass  Lab Results   Component Value  Date/Time    HGB 8.8 (L) 05/11/2023 07:07 AM    HGB 9.6 (L) 05/10/2023 05:37 AM    HGB 8.3 (L) 05/06/2023 04:22 AM     Lab Results   Component Value Date    IRON 25 (L) 04/28/2023    PSAT 17 (L) 04/28/2023    TIBC 148 (L) 04/28/2023    FERRITIN 950 (H) 04/28/2023     > s/p IV Venofer 300 mg x1 on 03/31/23  - 04/30/23: Colonoscopy: no active bleeding or blood->suspect prior bleeding may have been hemorrhoidal vs diverticular based on findings  - 05/10/23: LFTS and lipase normal: lipase 17, AST 40, ALT 15, Alk phos 105    UTI  - 1/13: complaints of dysuria with UA concerning for UTI  - no longer on SGLT2i (dc'd mid-December 2024)  - received IV ceftriaxone and dosycycline  > UA pending      Type 2 DM  Lab Results   Component Value Date/Time    HGBA1C 6.3 (H) 04/20/2023 04:40 AM    HGBA1C 8.0 (H) 09/16/2022 07:02 PM    HGBA1C 7.4 (H) 07/14/2022 02:44 PM   > management per primary team    Multiple myeloma   - She follows with [facility] and currently on Daratumumab     _____________________________________________________________________________    Reason for Consultation:  Evaluation and recommendations re: heart failure     Subjective: Raven [name] currently complains of shortness of breath and cough  without any improvement. Her lower extremity edema is resolved.  She is more alert and awake today.  She currently denies paroxysmal nocturnal dyspnea, orthopnea, lightheadedness, near syncope, palpitations, chest pain, and abdominal fullness.    Objective:                           Vital Signs:  Last Filed                Vital Signs: 24 Hour Range   BP: 95/58 (01/14 1129)  Temp: 36.3 ?C (97.3 ?F) (01/14 1129)  Pulse: 81 (01/14 1129)  Respirations: 18 PER MINUTE (01/14 1129)  SpO2: 96 % (01/14 1129)  O2 Device: Nasal cannula (01/14 1129)  O2 Liter Flow: 3 Lpm (01/14 0802)  BP: (91-117)/(47-76)   Temp:  [36.3 ?C (97.3 ?F)-36.9 ?C (98.4 ?F)]   Pulse:  [79-90]   Respirations:  [12 PER MINUTE-26 PER MINUTE]   SpO2:  [92 %-100 %]   O2 Device: Nasal cannula  O2 Liter Flow: 3 Lpm           Wt Readings from Last 10 Encounters:   05/11/23 73.1 kg (161 lb 3.2 oz)   05/06/23 74.2 kg (163 lb 9.6 oz)   04/23/23 76 kg (167 lb 9.6 oz)   04/15/23 77.6 kg (171 lb) 04/06/23 75.9 kg (167 lb 5.3 oz)   03/26/23 74.2 kg (163 lb 9.3 oz)   03/19/23 77 kg (169 lb 12.8 oz)   02/16/23 75.3 kg (166 lb)   02/15/23 75.3 kg (166 lb)   01/04/23 75.2 kg (165 lb 12.8 oz)       Physical Exam:    General Appearance: no distress, sleepy  Neck Veins: JVP 10cm, HJR positive?, severe TR and JVD difficult to assess  Respiratory: breathing comfortably, lungs clear to auscultation with diminished breath sounds posterior lower lobes, no rales or rhonchi, no wheezing, on 2L O2  Cardiac Auscultation: Regular rhythm, S1, S2, no S3 or S4,  2/6 murmur  CVC to right chest  Lower Extremity Edema: no lower extremity edema  Abdominal Exam: soft, non-tender, bowel sounds normal  Orientation: usually clear historian, good insight    Laboratory Review:   CBC w/Diff    Lab Results   Component Value Date/Time    WBC 10.20 05/11/2023 07:07 AM    RBC 3.06 (L) 05/11/2023 07:07 AM    HGB 8.8 (L) 05/11/2023 07:07 AM    HCT 27.8 (L) 05/11/2023 07:07 AM    MCV 90.8 05/11/2023 07:07 AM    MCH 28.6 05/11/2023 07:07 AM    MCHC 31.5 (L) 05/11/2023 07:07 AM    RDW 24.0 (H) 05/11/2023 07:07 AM    PLTCT 257 05/11/2023 07:07 AM    MPV 9.5 05/11/2023 07:07 AM    Lab Results   Component Value Date/Time    NEUT 66 05/11/2023 07:07 AM    ANC 6.70 05/11/2023 07:07 AM    LYMA 22 (L) 05/11/2023 07:07 AM    ALC 2.30 05/11/2023 07:07 AM    MONA 8 05/11/2023 07:07 AM    AMC 0.90 (H) 05/11/2023 07:07 AM    EOSA 2 05/11/2023 07:07 AM    AEC 0.20 05/11/2023 07:07 AM    BASA 1 05/11/2023 07:07 AM    ABC 0.10 05/11/2023 07:07 AM         Chemistry    Lab Results   Component Value Date/Time    NA 143 05/11/2023 07:07 AM    K 5.1 05/11/2023 07:07 AM    CL 102 05/11/2023 07:07 AM    CO2 29 05/11/2023 07:07 AM    GAP 12 05/11/2023 07:07 AM    BUN 56 (H) 05/11/2023 07:07 AM    CR 3.38 (H) 05/11/2023 07:07 AM    GLU 154 (H) 05/11/2023 07:07 AM    MG 1.7 05/11/2023 07:07 AM    Lab Results   Component Value Date/Time    CA 9.0 05/11/2023 07:07 AM    PO4 4.0 04/27/2023 06:29 PM    ALBUMIN 3.4 (L) 05/11/2023 07:07 AM    TOTPROT 5.8 (L) 05/11/2023 07:07 AM    ALKPHOS 101 05/11/2023 07:07 AM    AST 75 (H) 05/11/2023 07:07 AM    ALT 41 05/11/2023 07:07 AM    TOTBILI 0.9 05/11/2023 07:07 AM    GFR 13 (L) 05/11/2023 07:07 AM            Renal Function    Lab Results   Component Value Date/Time    NA 143 05/11/2023 07:07 AM    K 5.1 05/11/2023 07:07 AM    CL 102 05/11/2023 07:07 AM    CO2 29 05/11/2023 07:07 AM    GAP 12 05/11/2023 07:07 AM    BUN 56 (H) 05/11/2023 07:07 AM    BUN 48 (H) 05/10/2023 05:37 AM    BUN 39 (H) 05/06/2023 04:22 AM    Lab Results   Component Value Date/Time    CR 3.38 (H) 05/11/2023 07:07 AM    CR 2.85 (H) 05/10/2023 05:37 AM    CR 2.70 (H) 05/06/2023 04:22 AM    GLU 154 (H) 05/11/2023 07:07 AM    CA 9.0 05/11/2023 07:07 AM    PO4 4.0 04/27/2023 06:29 PM    ALBUMIN 3.4 (L) 05/11/2023 07:07 AM        Lipid Profile INR   Lab Results   Component Value Date    CHOL 107 09/16/2022    TRIG 72 09/16/2022  HDL 50 09/16/2022    LDL 45 09/16/2022    VLDL 14 09/16/2022    NONHDLCHOL 57 09/16/2022         Lab Results   Component Value Date    INR 1.4 (H) 05/04/2023          Chest X-Ray 1/13:  Moderate cardiomegaly with indistinct vasculature and slightly improved   perihilar and bibasilar opacities consistent with CHF/volume overload and   slightly improved pulmonary edema.       CT abdomen/pelvis 05/10/23:  1. Development of mild peripancreatic thickening/stranding that most   likely represents acute pancreatitis.   2. Moderate thickening/stranding has redeveloped adjacent to the region of   the gastric outlet and duodenum, which was also present in May 2024 and   which likely represents an additional manifestation of the pancreatitis.    Gastritis or duodenitis or less likely.   3. Development of mild interstitial edema in the lung bases. Additional   patchy interstitial and alveolar opacities that are new may represent   additional manifestations of edema or infectious pneumonitis.   4. Development of mild ill-defined stranding/edema within the pelvis   extraperitoneal spaces that either represents edema or inflammation.   5. Unchanged bilobed cystic lesion in the tail of the pancreas.   Additional, adjacent smaller cystic lesions and ductal dilation has   slightly decreased.       Tele:  SR, HR 80s    ECG: SR with 1st degree AV block, HR 84

## 2023-05-11 NOTE — Case Management (ED)
Case Management Progress Note    NAME:Raven [name]                          MRN: [id number]              DOB:[birth year]          AGE: 78 y.o.  ADMISSION DATE: 05/10/2023             DAYS ADMITTED: LOS: 1 day      Today's Date: 05/11/2023    PLAN: Ongoing DX planning    Expected Discharge Date: 05/14/2023   Is Patient Medically Stable:no, ongoing tests  Are there Barriers to Discharge? no    INTERVENTION/DISPOSITION:  Discharge Planning              Discharge Planning: Home Health  Transportation              Does the Patient Need Case Management to Arrange Discharge Transport? (ex: facility, ambulance, wheelchair/stretcher, Medicaid, cab, other): No  Will the Patient Use Family Transport?: Yes  Support                  Palliative team (Dr. Maia Plan, Nila Nephew NP, Kara Mead 4th year med student, Brendia Sacks, Marisue Ivan MSW student) rounded on patient.  Patient was alert and sitting in chair. Patient stated that she was feeling good but her cough was the same. Patient indicated that it really hurt her stomach when she started coughing and the pain lasted for approximately one minute after the cough stopped.  Patient indicated that the cough was worse when she was lying flat and reported that her cough woke her up at night. When lying flat and she starts coughing, patient reports that she feels as if she is gasping for breath.     Dr. Bluford Main confirmed patient understanding that she was scheduled for a procedure tomorrow to look at her heart.  Patient indicated that she is hopeful that heart cath tomorrow will provide additional information that is helpful in her care. Additionally, a patch for pain was ordered to see if it alleviated some of patients stomach pain.    Info or Referral                 Positive SDOH Domains and Potential Barriers                   Medication Needs Financial                 Legal                 Other                 Discharge Disposition  Selected Continued Care - Admitted Since 05/10/2023       [facility] Coordination complete.      Service Provider Services Address Phone Fax Patient Preferred    [facility] -- [address] [place] [address] [phone] --                      [name]

## 2023-05-12 ENCOUNTER — Encounter: Admit: 2023-05-12 | Discharge: 2023-05-12 | Payer: MEDICARE

## 2023-05-12 LAB — MANUAL DIFF
~~LOC~~ BKR BASOPHILS - RELATIVE: 2 % (ref 0–2)
~~LOC~~ BKR EOSINOPHILS - RELATIVE: 1 % (ref 0–5)
~~LOC~~ BKR LYMPHOCYTES - RELATIVE: 12 % — ABNORMAL LOW (ref 24–44)
~~LOC~~ BKR METAMYELOCYTES - RELATIVE: 2 %
~~LOC~~ BKR MONOCYTES - RELATIVE: 11 % (ref 4–12)
~~LOC~~ BKR MYELOCYTES - RELATIVE: 1 %
~~LOC~~ BKR NEUT+BANDS - ABSOLUTE: 7.3 10*3/uL — ABNORMAL HIGH (ref 1.8–7.0)
~~LOC~~ BKR NEUTROPHILS - RELATIVE: 71 % (ref 41–77)
~~LOC~~ BKR NUCLEATED RBC MANUAL: 1 10*3/uL

## 2023-05-12 LAB — POC GLUCOSE
~~LOC~~ BKR POC GLUCOSE: 189 mg/dL — ABNORMAL HIGH (ref 70–100)
~~LOC~~ BKR POC GLUCOSE: 172 mg/dL — ABNORMAL HIGH (ref 70–100)
~~LOC~~ BKR POC GLUCOSE: 182 mg/dL — ABNORMAL HIGH (ref 70–100)
~~LOC~~ BKR POC GLUCOSE: 189 mg/dL — ABNORMAL HIGH (ref 70–100)

## 2023-05-12 MED ORDER — MAGNESIUM SULFATE IN D5W 1 GRAM/100 ML IV PGBK
1 g | INTRAVENOUS | 0 refills | Status: CP
Start: 2023-05-12 — End: ?
  Administered 2023-05-13: 04:00:00 1 g via INTRAVENOUS

## 2023-05-12 MED ORDER — DIPHENHYDRAMINE HCL 25 MG PO CAP
25 mg | ORAL | 0 refills | Status: DC | PRN
Start: 2023-05-12 — End: 2023-05-15

## 2023-05-12 MED ORDER — DIPHENHYDRAMINE HCL 50 MG/ML IJ SOLN
25 mg | INTRAVENOUS | 0 refills | Status: DC | PRN
Start: 2023-05-12 — End: 2023-05-15

## 2023-05-12 MED ORDER — BUMETANIDE 0.25 MG/ML IJ SOLN
4 mg | Freq: Once | INTRAVENOUS | 0 refills | Status: CP
Start: 2023-05-12 — End: ?
  Administered 2023-05-13: 04:00:00 4 mg via INTRAVENOUS

## 2023-05-12 MED ORDER — CHLOROTHIAZIDE SODIUM 500 MG IV SOLR
500 mg | Freq: Once | INTRAVENOUS | 0 refills | Status: CP
Start: 2023-05-12 — End: ?
  Administered 2023-05-13: 06:00:00 500 mg via INTRAVENOUS

## 2023-05-12 MED ORDER — ONDANSETRON HCL (PF) 4 MG/2 ML IJ SOLN
4 mg | INTRAVENOUS | 0 refills | Status: DC | PRN
Start: 2023-05-12 — End: 2023-05-13

## 2023-05-12 NOTE — H&P (View-Only)
Heart Failure H&P Note    NAME:Raven [name]                                                                   MRN: [id number]                 DOB:Oct 08, 1945          AGE: 78 y.o.  ADMISSION DATE: 05/10/2023             DAYS ADMITTED: LOS: 2 days      Chief Complaint:  Evaluation and recommendations re: heart failure.        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     She presented to the ED on 05/10/23 with complaints of not feeling well, dyspnea, dry cough, nausea/vomiting and dysuria.  Her NT-pro BNP was elevated at 15065 (previously 11379) and her troponins were slightly elevated at 49>40>39.  Her UA was suspicious for UTI, she was started on ceftriaxone.  CXR revealed volume overload with slightly improved pulmonary edema.  Her creatinine was slightly elevated from her baseline as well.  Unfortunately, this is her 6th hospitalization over the last 6 weeks and 9th admission over the past 9 months.  She was admitted to medicine with palliative and Heart Failure consults to assist with management.    She underwent RHC on 1/15 with RA 19, RV 72/7, PA 72/34(47), PCWP 36 v-wave 49, PA sat 38%, CO 3.25, CI 1.85 by fick. PAC was left in place and transferred to CICU for aggressive diuresis and symptom management.       Plan Today 05/12/2023:  Admit to CICU from cath lab with leave in [facility]  Maintain milrinone at PTA dose for now  Aggressive IV diuresis  Consider reduced milrinone/ switch to DBA if worsening renal function   Resume Apixiban      CICU Course:  1/15 - Transfer to CICU with leave in [facility]; continue PTA milrinone, aggressive IV diuresis     Assessment/Plan:   CARDIAC  Chronic cardiogenic shock  Acute on chronic systolic and diastolic HFrEF,  EF: 30%.  Ischemic cardiomyopathy  - Major Complications or Comorbidities [facility]): cardiogenic shock, acute/ acute on chronic systolic and/or diastolic heart failure, and cardiorenal syndrome (with documentation of CKD)  - NYHA functional  class III (marked limitation of physical activity - comfortable at rest, but less than ordinary activity causes symptoms of HF e.g., getting dressed or standing from a sitting position),   - ACC Stage D (refractory HF requiring specialized interventions)  - Admission BNP:   Lab Results   Component Value Date    NTPROBNP 15,065 (H) 05/10/2023       Diuretic Therapy     Prior to admission dose    Torsemide 40mg  po BID   Given on admission    1/13: lasix 80mg  IV x 1; bumex 3mg  IV BID   Daily Dosing   1/14: hold with bump in creatinine  1/15: hold with further bump in creatinine, await RHC  1/15 PM: 4mg  IV Bumex      Guideline Directed Medical Therapy PTA Inpatient Changes   ACEI/ARB N/A-CKD IV      ARNI N/A-CKD IV      BB N/A-cardiogenic shock on home inotrope; previously tolerated Toprol XL which was discontinued recently     SGLT-2 Inhibitor None with CKD (dc'd mid-December), now with UTI     Mineralocorticoid Receptor Antagonist N/A-CKD IV      Hydralazine/Nitrate none     Ivabradine none     Heart Rhythm Management Therapy Yes (ICD)      Anticoagulation for Afib/flutter Elqiuis 5mg  po BID Held for RHC, resumed 1/15 PM   Cardiac Rehab Evaluation for LVEF < or equal to 40% Recently ordered 04/12/23     7-Day post hospital follow up scheduled within 24-48 hours of discharge            Testing/Procedures        - 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 0.3 mcg/kg/min (this was started in May 2025 after low cardiac index on right heart cath)  - GDMT limited by CKD and shock  - Initially received IV diuretics, held due to bump in creatinine until results of cath  - RHC 1/15 with elevated filling pressures and low cardiac index  - 9 hospitalizations in last 9 months, with 6 of those in last 6 weeks  - Palliative following, she continues to have aggressive care goals, finds benefit from symptom management during hospitalizations  Plan:  > Diuresis as above  > GDMT as above   > Continue PTA milrinone dose, consider reducing or switching to dobutamine if worsening renal function    Paroxysmal A-fib  ICD  - ICD interrogation 04/29/23: AP <1%/VP <1% with presenting rhythm AS-VS 90s with elevated HF diagnostics   > PTA amiodarone  > Resume Eliquis    CAD s/p CABG x 3 (2010)  HLD  > PTA statin  > ASA and Plavix DC'd due to GIB      NEURO   Acute metabolic encephalopathy  - Family reports intermittent confusion during this hospitalization (forgetting family members names), possibly related to UTI  - MAE; follows commands  Plan:  > Maintain sleep wake cycle  > Can use Melatonin as needed     PULM  COPD  - Oxygenating well on 2L NC  Plan:  > O2 as needed for sat > 92%  > PTA albuterol, symbicort      GI  Pancreatic Cyst  Elevated LFTs  - possibly in setting of shock  - CT abdomen/pelvis from 04/02/2023 revealed mild increase in size of indeterminant large lobulated cystic mass in the tail of the pancreas   - CT Abdomen/Pelvis 1/13: Mild peripancreatic thickening/stranding that most likely represents acute pancreatitis.  Moderate thickening/stranding has redeveloped adjacent to the region of the gastric outlet and duodenum, which was also present in May 2024 and which likely represents an additional manifestation of the pancreatitis.  - No abdominal pain, and lipase is normal.   - Per medicine on admission, defer treatment for pancreatitis at this time, in favor of treating suspected UTI and Acute on chronic HFrEF exacerbation.  Plan:  > Trend CMP  > Continue PPI    RENAL  AKI on CKD IV  - Baseline creatinine ~2.4, noted creatinine up to 3.66   - Renal following  Plan:  > Diuresis as above     ID  UTI  - UA concerning for UTI  - Urine culture with mixed contaminants   - Started on ceftriaxone  - Afebrile; WBC 10.3  Plan:  > Culture with fever  > Continue ceftriaxone     HEME  Multiple Myeloma  Chronic Anemia  Chronic Anticoagulation  - Hgb 8.4, plts 272 on admission  - Follows at [facility] and currently on Daratumumab  - Continue PTA acyclovir for prophylaxis  Plan:  > Apixiban as above     ENDO  DM type II  - Most recent hemoglobin A1c from 04/20/2023 was 6.3  > LDCF while admitted      SKIN/MSK       FEN  > Magnesium goal >2.0, i-Cal goal > 1.0, Potassium goal >4.0 mEq/L; replace electrolytes as needed    Prophylaxis: PPI, Apixiban  Code: Full   Disposition: ICU while PAC in place    Review of Systems:  Mrs. Rathod is slightly confused on exam but interactive, and oriented to current situation.  Family reports she has been forgetting some of their names, being more forgetful than usual during this hospitalization.  She tells me she had been having significant nausea, belching, abdominal fullness prior to admission.  At this time she denies shortness of breath but does report a cough.  She denies chest pain, palpitations or additional symptoms.    A comprehensive review of systems was negative except for: As listed above.    Past Medical History:    Anemia    Anxiety    Arthritis    Asthma    Asymptomatic stenosis of right carotid artery    Atrial fibrillation (HCC)    Back pain    Cancer of uterus (HCC)    Chronic kidney disease    COPD (chronic obstructive pulmonary disease) (HCC)    Coronary artery disease    Diabetes mellitus (HCC)    Dyslipidemia    Flank mass    Fluid retention    GI bleed    Heart disease    Heart failure (HCC)    Hypertension    Multiple myeloma (HCC)    OSA on CPAP    Other malignant neoplasm without specification of site    Vision decreased     Surgical History:   Procedure Laterality Date    CORONARY ARTERY BYPASS GRAFT  2010    CABG 3 V    ROBOT ASSISTED LOW ANTERIOR RESECTION N/A 08/10/2022    Performed by Benetta Spar, MD at [facility] OR    ESOPHAGOGASTRODUODENOSCOPY WITH BIOPSY - FLEXIBLE N/A 09/17/2022    Performed by Tempie Hoist, DO at [facility] ENDO    CATHETERIZATION RIGHT HEART N/A 09/18/2022    Performed by Cath, Physician at [facility] CATH LAB    REMOVAL AND REPLACEMENT IMPLANTABLE DEFIBRILLATOR GENERATOR - SINGLE LEAD SYSTEM  Left 11/04/2022    Performed by Kathreen Cornfield, MD at [facility] EP LAB    INSERTION/ REPLACEMENT PERMANENT PACEMAKER WITH ATRIAL LEAD Left 11/04/2022    Performed by Kathreen Cornfield, MD at [facility] EP LAB    Injection Venography Extremity Left 11/04/2022    Performed by Kathreen Cornfield, MD at [facility] EP LAB    ESOPHAGOGASTRODUODENOSCOPY WITH BIOPSY - FLEXIBLE N/A 03/29/2023    Performed by Buckles, Vinnie Level, MD at [facility] ENDO    ESOPHAGOGASTRODUODENOSCOPY WITH SNARE REMOVAL TUMOR/ POLYP/ OTHER LESION - FLEXIBLE N/A 03/29/2023    Performed by Buckles, Vinnie Level, MD at [facility] ENDO    ESOPHAGOGASTRODUODENOSCOPY WITH CONTROL OF BLEEDING - FLEXIBLE N/A 04/04/2023    Performed by Buckles, Vinnie Level, MD at [facility] ENDO    COLONOSCOPY WITH BIOPSY - FLEXIBLE N/A 04/30/2023    Performed by Onnie Boer, MD at [facility] ENDO    BONE MARROW BIOPSY      CARDIAC DEFIBRILLATOR PLACEMENT      [employer]    CAROTID ENDARDECTOMY Right     COLONOSCOPY      HX CORONARY STENT PLACEMENT      HX HEART CATHETERIZATION      HX HYSTERECTOMY      TUNNELED VENOUS PORT PLACEMENT Right     Chest     Family History   Problem Relation Name Age of Onset    Cancer Mother      Diabetes Mother      Hypertension Mother      Cancer-Breast Sister      Cancer-Ovarian Sister      Cancer Sister      Diabetes Sister      Heart Disease Sister      Hypertension Sister      Heart Disease Brother      Hypertension Brother      Stroke Brother       Social History     Socioeconomic History    Marital status: Widowed   Tobacco Use    Smoking status: Former     Current packs/day: 0.00     Average packs/day: 1 pack/day for 20.0 years (20.0 ttl pk-yrs)     Types: Cigarettes     Start date: 65     Quit date: 2010     Years since quitting: 15.0     Passive exposure: Never    Smokeless tobacco: Never   Vaping Use    Vaping status: Never Used   Substance and Sexual Activity    Alcohol use: Not Currently    Drug use: Not Currently        Physical Exam:    General Appearance: no acute distress, obese  Skin: warm and dry  Lips & Oral Mucosa: no pallor or cyanosis   Digits and Nails: normal color; smooth symmetric nails and digits  Eyes: conjunctivae and lids normal  Neck Veins: CVP 8-10 via right IJ PAC  Auscultation/Percussion: breathing comfortably; lungs clear to auscultation; no rales, rhonchi, or wheezing  Cardiac Auscultation: Regular rhythm, S1, S2 normal; no S3 or S4; systolic murmur   Radial Arteries: normal symmetric radial pulses  Pedal Pulses: pulses 2+, symmetric  Lower Extremity Edema: Trace  Abdominal Exam: abdomen soft, non-tender; bowel sounds normoactive; no hepatomegaly  Gait & Station: Not observed  Orientation: clear historian, good insight    Objective:  Allergies:   Allergies   Allergen Reactions    Bortezomib RASH     Noted in  Provider note 8/22 cancer treatment drug    Allopurinol HIVES    Sulfa (Sulfonamide Antibiotics) NAUSEA ONLY        Medications:  Scheduled Meds:acyclovir (ZOVIRAX) tablet 400 mg, 400 mg, Oral, BID  [Transfer Hold] albuterol sulfate (PROAIR HFA) inhaler 2 puff, 2 puff, Inhalation, BID & PRN  amiodarone (CORDARONE) tablet 200 mg, 200 mg, Oral, QDAY  [Held by Provider] apixaban (ELIQUIS) tablet 5 mg, 5 mg, Oral, BID  [Transfer Hold] benzonatate (TESSALON PERLES) capsule 100 mg, 100 mg, Oral, TID  [Transfer Hold] budesonide-formoterol HFA (SYMBICORT) 160-4.5 mcg/actuation inhalation 2 puff, 2 puff, Inhalation, BID  cefTRIAXone (ROCEPHIN) IVP 1 g, 1 g, Intravenous, Q24H*  [Transfer Hold] CHOLEcalciferoL (vitamin D3) tablet 1,000 Units, 1,000 Units, Oral, QDAY  [Transfer Hold] cyanocobalamin (vitamin B-12) tablet 5,000 mcg, 5,000 mcg, Oral, QDAY  [Transfer Hold] ferrous sulfate (FEOSOL) tablet 325 mg, 325 mg, Oral, QDAY  [Transfer Hold] insulin aspart (U-100) (NOVOLOG FLEXPEN U-100 INSULIN) injection PEN 0-6 Units, 0-6 Units, Subcutaneous, ACHS (22)  [Transfer Hold] lidocaine (LIDODERM) 5 % topical patch 1 patch, 1 patch, Topical, QDAY  [Transfer Hold] magnesium oxide (MAGOX) tablet 400 mg, 400 mg, Oral, QHS  [Transfer Hold] pantoprazole DR (PROTONIX) tablet 40 mg, 40 mg, Oral, BID  [Transfer Hold] rosuvastatin (CRESTOR) tablet 10 mg, 10 mg, Oral, QDAY  [Transfer Hold] scopolamine (TRANSDERM-SCOP) 1mg  over 3 days patch 1 patch, 1 patch, Transdermal, Q72H*   And  [Transfer Hold] Verification of Patch Placement and Integrity - Scopolamine base 1 mg/3 days 1 patch, 1 patch, Transdermal, BID  [Transfer Hold] sertraline (ZOLOFT) tablet 50 mg, 50 mg, Oral, QDAY    Continuous Infusions:   milrinone (PRIMACOR) 20 mg/D5W 100 mL infusion 0.3 mcg/kg/min (05/12/23 0619)     PRN and Respiratory Meds:[Transfer Hold] acetaminophen Q8H PRN, [Transfer Hold] dextromethorphan-guaiFENesin Q4H PRN, dextrose 50% (D50) IV PRN, [Transfer Hold] eucalyptus-menthoL Q2H PRN, [Transfer Hold] melatonin QHS PRN, [Transfer Hold] nitroglycerin Q5 MIN PRN, [Transfer Hold] ondansetron Q6H PRN **OR** [Transfer Hold] ondansetron (ZOFRAN) IV Q6H PRN, [Transfer Hold] polyethylene glycol 3350 QDAY PRN, [Transfer Hold] sennosides-docusate sodium QDAY PRN    Medications Prior to Admission   Medication Sig Dispense Refill Last Dose/Taking    acetaminophen (TYLENOL) 325 mg tablet Take three tablets by mouth every 8 hours as needed for Pain. Indications: pain 40 tablet 0 Past Week    acyclovir (ZOVIRAX) 400 mg tablet Take one tablet by mouth twice daily.   05/09/2023 Evening    albuterol sulfate (PROAIR HFA) 90 mcg/actuation HFA aerosol inhaler Inhale two puffs by mouth into the lungs every 6 hours as needed for Wheezing or Shortness of Breath.   Past Week    amiodarone (CORDARONE) 200 mg tablet Take one tablet by mouth daily. Indications: prevention of recurrent atrial fibrillation 90 tablet 1 05/09/2023 Morning    apixaban (ELIQUIS) 5 mg tablet Take one tablet by mouth twice daily. Indications: afib 60 tablet 0 05/09/2023 Evening    budesonide-formoterol HFA (SYMBICORT) 160-4.5 mcg/actuation aerosol inhaler Inhale two puffs by mouth into the lungs twice daily. Indications: bronchospasm prevention with COPD 30.6 g 0 05/09/2023 Evening    cholecalciferol (VITAMIN D) 1,000 units tablet Take one tablet by mouth daily.   05/09/2023 Morning    cyanocobalamin (VITAMIN B-12) 1,000 mcg tablet Take 5,000 Units by mouth daily.   05/09/2023 Morning    dexAMETHasone (DECADRON) 4 mg tablet Take two tablets by mouth every 7 days. Patient is taking 8mg  on Thursdays prior to chemo (Patient not taking: Reported  on 05/10/2023)   Not Taking    dextromethorphan-guaiFENesin (ROBITUSSIN-DM) 10-100 mg/5 mL oral syrup Take 10 mL by mouth every 4 hours as needed. Indications: cough 120 mL 0 Past Month    EMU OIL (BULK) MISC Use  as directed. Apply topically as needed   Past Week    febuxostat (ULORIC) 40 mg tablet Take one-half tablet by mouth daily. Indications: taken on chemo days 45 tablet 0 05/09/2023 Morning    ferrous sulfate (FEOSOL) 325 mg (65 mg iron) tablet Take one tablet by mouth daily. Take on an empty stomach at least 1 hour before or 2 hours after food.   05/09/2023 Morning    magnesium oxide (MAGOX) 400 mg (241.3 mg magnesium) tablet Take one tablet by mouth at bedtime daily. Indications: low amount of magnesium in the blood 180 tablet 3 05/09/2023 Bedtime    milrinone (PRIMACOR) 200 mcg/mL infusion 0.3 mcg/kg/min ? 78.3 kg continuous  Indications: sudden and serious symptoms of heart failure called acute decompensated heart failure   05/10/2023 Morning    nitroglycerin (NITROSTAT) 0.4 mg tablet Place one tablet under tongue every 5 minutes as needed for Chest Pain.   Unknown    pantoprazole DR (PROTONIX) 40 mg tablet Take one tablet by mouth twice daily. Indications: gastritis 112 tablet 0 05/09/2023 Evening    potassium chloride SR (K-DUR) 20 mEq tablet Take two tablets by mouth twice daily. Indications: low amount of potassium in the blood 120 tablet 2 05/09/2023 Evening    rosuvastatin (CRESTOR) 20 mg tablet Take one-half tablet by mouth daily. Indications: excessive fat in the blood 90 tablet 0 05/09/2023 Morning    sertraline (ZOLOFT) 50 mg tablet Take one tablet by mouth daily.   05/09/2023 Morning    torsemide (DEMADEX) 20 mg tablet Take two tablets by mouth twice daily. Indications: accumulation of fluid resulting from chronic heart failure 120 tablet 0 05/09/2023 Morning                             Vital Signs:  Last Filed                Vital Signs: 24 Hour Range   BP: 109/77 (01/15 1600)  Temp: 36.8 ?C (98.3 ?F) (01/15 1558)  Pulse: 87 (01/15 1600)  Respirations: 20 PER MINUTE (01/15 1600)  SpO2: 95 % (01/15 1600)  O2 Device: None (Room air) (01/15 1558)  O2 Liter Flow: 3 Lpm (01/15 1558)  BP: (93-122)/(45-77)   Temp:  [36.3 ?C (97.3 ?F)-36.8 ?C (98.3 ?F)]   Pulse:  [87-92]   Respirations:  [8 PER MINUTE-21 PER MINUTE]   SpO2:  [93 %-100 %]   O2 Device: None (Room air)  O2 Liter Flow: 3 Lpm             Intake/Output Summary:  (Last 24 hours)    Intake/Output Summary (Last 24 hours) at 05/12/2023 1911  Last data filed at 05/12/2023 1357  Gross per 24 hour   Intake 0 ml   Output 400 ml   Net -400 ml       Wt Readings from Last 10 Encounters:   05/12/23 74.1 kg (163 lb 6.4 oz)   05/06/23 74.2 kg (163 lb 9.6 oz)   04/23/23 76 kg (167 lb 9.6 oz)   04/15/23 77.6 kg (171 lb)   04/06/23 75.9 kg (167 lb 5.3 oz)   03/26/23 74.2 kg (163 lb 9.3 oz)   03/19/23 77  kg (169 lb 12.8 oz)   02/16/23 75.3 kg (166 lb)   02/15/23 75.3 kg (166 lb)   01/04/23 75.2 kg (165 lb 12.8 oz)         I have seen, personally fully evaluated, and discussed this patient with the CICU team.  This patient remains critically ill with cardiogenic shock, acute on chronic systolic and diastolic heart failure, AKI on CKD and is at high risk for morbidity and mortality. I have spent 47 minutes of critical care time including repeated evaluation and examination of patient on admission and throughout the shift, including: review of hemodynamics, imaging, laboratory data, hemodynamic monitoring and management, lab and radiology review, medication review and management, fluid and electrolyte management, and other pertinent records in the ICU, as well as coordination of care with consulting teams.    Maylene Roes, APRN-NP  Cardiology Critical Care  Available on [facility]   05/12/2023

## 2023-05-12 NOTE — Progress Notes
Heart Failure Progress Note    NAME:Raven [name]             MRN: [id number]                 DOB:10/28/1945          AGE: 78 y.o.  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     She follows with Dr. Herma Carson. [name] and [name], APRN.    She presented to the ED on 05/10/23 with complaints of not feeling well, dyspnea, dry cough, nausea/vomiting and dysuria.  Her NT-pro BNP was elevated at 15065 (previously 11379) and her troponins were slightly elevated at 49>40>39.  Her UA was suspicious for UTI.  CXR revealed volume overload with slightly improved pulmonary edema.  Her creatinine was slightly elevated from her baseline as well.      Unfortunately, this is her 6th hospitalization over the last 6 weeks and 9th admission over the past 9 months.   Heart Failure was consulted to assist with management.    Admitted from 11/27-12/4/24 with melena and anemia, received PRBCs and underwent EGD on 12/2 which revealed 1.5 cm pedunculated viable polyp in duodenum which was clipped, Aspirin and Plavix were discontinued and she remained on Eliquis for anticoagulation.  Admitted from 12/6-12/13/24 with hematemesis, melena, symptomatic anemia.  Although her Plavix and aspirin had been discontinued during prior admission, pt reported she continued taking both aspirin and Plavix along with her Eliquis.  She required x4 units of PRBCs. Repeat EGD on 12/8 showed ulcer in duodenum and visible vessel with no active bleeding which was injected with epinephrine and Endo Clip placed.  Admitted from 12/15-12/19/24 for decompensated heart failure, required IV diuresis. It was noted that her milrinone infusion had been off for unknown amount of time.  For ongoing anemia she required x1 unit of PRBCs.  Admitted from 12/23-12/27/24 after she presented to HF clinic reporting symptoms of shortness of breath, concerns of hypervolemia and ~10 lbs weight gain.  She received IV diuresis, had developed a cough which was later identified as RSV when she was seen by her local ED.  Admitted from 12/31-05/06/23 after presenting to the ED with complaints of dyspnea and hemotochezia.  She was found to have RSV.  She required IV bumex with metolazone for diuresis.  She underwent colonoscopy for workup of hematochezia.  She was discharged home on torsemide 40mg  po BID.    She reports that she  starting feeling poorly on the day prior to admission with increased shortness of breath and cough.  She has been weighing herself and her home weights have been between 159-160 pounds.  She reports she has been drinking and eating normally at home, and she has not missed any medications.  Her milrinone is infusing without difficulty.       Recommendations:  Chronic cardiogenic shock: continue home milrinone 0.70mcg/kg/min->may need to reduce dose with current renal function, but will await RHC  Diuretics: hold diuretics today again with bump in creatinine  Continue to hold eliquis ->plan to resume following RHC  RHC this afternoon  Appreciate Palliative care consult's assistance: patient content with hospitalizations and wants aggressive care - will need follow-up with [name] in the HF clinic   HFrEF GDMT: limited with CKD and hx cardiogenic shock. Toprol XL stopped during hospitalization 12/23-12/27. Will not resume at this time. Previously on [name] which was recently dc'd, defer resumption at this time with reduced creatinine clearance.  Paroxsymal AFib: continue PTA Amiodarone 200 mg daily.  Hold PTA eliquis for RHC tomorrow and will resume once RHC completed.  Messaged Watchman navigator 04/29/23 to schedule appointment to discuss Watchman outpatient. [id number]   CAD: continue PTA Rosuvastatin 10 mg daily, not on aspirin with recent GIB  Pancreatic cyst: further workup as an outpatient per GI  Heart Failure will continue to follow      Ongoing:  BMP once a day. Mg level daily. Maintain K+ over 4.0 and Mg+ over 2.0   2000mg  sodium dietary restriction.  Fluid Restriction:2L  Strict I/O. Goal output: net evenL/24 hour  Daily standing scale weight. Goal Dry Weight: 162-163 pounds        Primary team responsible for placing Post-Discharge Health System Appointment Request order set for Cardiology: Heart Failure appointment request within 24-48 hours of discharge. (Please do not place order any earlier in effort to reduce possible need for cancellation and rescheduling of visit).      Pt examined and discussed with Dr. [name], heart failure attending.  Discussed with Dr. [name], primary team      [name], APRN-NP  Available on Voalte  Heart Failure Consult Service      Assessment:   Acute on chronic systolic and diastolic HFrEF,  EF: 30%.  Ischemic Cardiomyopathy  Major Complications or Comorbidities [name]): cardiogenic shock, acute/ acute on chronic systolic and/or diastolic heart failure, and cardiorenal syndrome (with documentation of CKD)  NYHA functional class III (marked limitation of physical activity - comfortable at rest, but less than ordinary activity causes symptoms of HF e.g., getting dressed or standing from a sitting position),   ACC Stage D (refractory HF requiring specialized interventions).   She presents with signs of hypervolemia with left  ventricular failure with signs of low flow state.  BNP Labs:   Lab Results   Component Value Date    NTPROBNP 15,065 (H) 05/10/2023     High Sensitivity Troponin:  Lab Results   Component Value Date    HSTROP0HR 49 (H) 05/10/2023    HSTROP2HR 40 (H) 05/10/2023    HSTROP4HR 39 (H) 05/10/2023    HSTROPDELT4 -1 05/10/2023    HSTROPDELTA -9 05/10/2023    HIGHSTROPI 28 (H) 04/12/2023         Admission Weight: 72.1 kg (159 lb)        Most recent weights (inpatient):   Vitals:    05/11/23 0404 05/11/23 1317 05/12/23 0330   Weight: 73.1 kg (161 lb  3.2 oz) 73.4 kg (161 lb 12.8 oz) 74.1 kg (163 lb 6.4 oz)        Strict I/Os:   net I/O for entire hospital stay: +192mL (not complete)  net for last 24 hrs: +529mL  UOP last 24 hrs:      PLAN:  Diuretic Therapy    Prior to admission dose    Torsemide 40mg  po BID   Given on admission    1/13: lasix 80mg  IV x 1; bumex 3mg  IV BID   Daily Dosing   1/14: hold with bump in creatinine  1/15: hold with further bump in creatinine, await RHC     Guideline Directed Medical Therapy PTA Inpatient Changes   ACEI/ARB N/A-CKD IV     ARNI N/A-CKD IV     BB N/A-cardiogenic shock on home inotrope; previously tolerated Toprol XL which was discontinued recently    SGLT-2 Inhibitor None with CKD (dc'd mid-December), now with UTI    Mineralocorticoid Receptor Antagonist N/A-CKD IV     Hydralazine/Nitrate none    Ivabradine none    Heart Rhythm Management Therapy Yes (ICD)     Anticoagulation for Afib/flutter Elqiuis 5mg  po BID continued   Cardiac Rehab Evaluation for LVEF < or equal to 40% Recently ordered 04/12/23    7-Day post hospital follow up scheduled within 24-48 hours of discharge       Inotrope Therapy Dose & Adjustments  (document symptom and hemodynamic response if pertinent)   Milrinone 0.3 mcg/kg/min  Started during hospitalization May 2024        Testing/Procedures   RHC 05/12/23: pending    04/12/23 Echo:  Eccentric hypertrophy of the left ventricle with severe dilatation of the left ventricular chamber  Left ventricular systolic function is moderately to severely impaired with an estimate ejection fraction of 30% and global hypokinesis  Right ventricle is moderately dilated with normal function  The left atrium is severely dilated  Pacemaker lead seen in the right atrium and right ventricle  Severe mitral valve insufficiency  Severe tricuspid valve insufficiency  No pericardial effusion  Severe pulmonary hypertension with estimated peak systolic pulmonary artery pressure of 73 mmHg     RHC 09/18/22 (weight 165 pounds)  RA 13  RV 70/12  PA 72/27 with mean 47  PCWP 17  TPG 30  PVR 12  CO/CI 2.5/1.4 by thermo  CO/CI 1.9/1.1 by Fick      CAD s/p CABG x3 (2010)  > cont statin therapy; aspirin and Plavix recently dc'd with GIB     S/p ICD  - ICD interrogation 04/29/23: AP <1%/VP <1% with presenting rhythm AS-VS 90s with elevated HF diagnostics    Hyperlipidemia  Lab Results   Component Value Date/Time    CHOL 107 09/16/2022 07:02 PM    TRIG 72 09/16/2022 07:02 PM    HDL 50 09/16/2022 07:02 PM    LDL 45 09/16/2022 07:02 PM    VLDL 14 09/16/2022 07:02 PM    NONHDLCHOL 57 09/16/2022 07:02 PM   > cont PTA Rosuvastatin 10 mg/day     AKI on CKD  - baseline creatinine: 2.4-2.6  - admission creatinine 1/13: 2.85  - recent creatinine trends:  Lab Results   Component Value Date/Time    CR 3.66 (H) 05/12/2023 03:19 AM    CR 3.38 (H) 05/11/2023 07:07 AM    CR 2.85 (H) 05/10/2023 05:37 AM    CR 2.70 (H) 05/06/2023 04:22 AM    CR 2.72 (H) 05/05/2023 03:47 AM   >  daily labs  > 05/12/23: nephrology consult    Acute pancreatitis  Chronic Anemia  Frequent GIB  Pancreatic cystic mass  Lab Results   Component Value Date/Time    HGB 8.4 (L) 05/12/2023 03:19 AM    HGB 8.8 (L) 05/11/2023 07:07 AM HGB 9.6 (L) 05/10/2023 05:37 AM     Lab Results   Component Value Date    IRON 25 (L) 04/28/2023    PSAT 17 (L) 04/28/2023    TIBC 148 (L) 04/28/2023    FERRITIN 950 (H) 04/28/2023     > s/p IV Venofer 300 mg x1 on 03/31/23  - 04/30/23: Colonoscopy: no active bleeding or blood->suspect prior bleeding may have been hemorrhoidal vs diverticular based on findings  - 05/10/23: LFTS and lipase normal: lipase 17, AST 40, ALT 15, Alk phos 105  - 05/12/23: worsening LFTs: AST 122, ALT 78, Alk phos 113     UTI  - 1/13: complaints of dysuria with UA concerning for UTI  - no longer on SGLT2i (dc'd mid-December 2024)  - received IV ceftriaxone and dosycycline  > UA 1/13: mixed contaminants      Type 2 DM  Lab Results   Component Value Date/Time    HGBA1C 6.3 (H) 04/20/2023 04:40 AM    HGBA1C 8.0 (H) 09/16/2022 07:02 PM    HGBA1C 7.4 (H) 07/14/2022 02:44 PM   > management per primary team    Multiple myeloma   - She follows with [facility] oncology and currently on Daratumumab     _____________________________________________________________________________    Reason for Consultation:  Evaluation and recommendations re: heart failure     Subjective: Mardene Celeste currently complains of shortness of breath and cough  with some improvement. Her lower extremity edema is resolved.  She is more fatigued/sleepy today.  Her son is at the bedside.   She currently denies paroxysmal nocturnal dyspnea, orthopnea, lightheadedness, near syncope, palpitations, chest pain, and abdominal fullness.    Objective:                           Vital Signs:  Last Filed                Vital Signs: 24 Hour Range   BP: 103/54 (01/15 0324)  Temp: 36.5 ?C (97.7 ?F) (01/15 0454)  Pulse: 88 (01/15 0324)  Respirations: 18 PER MINUTE (01/15 0324)  SpO2: 100 % (01/15 0324)  O2 Device: Nasal cannula (01/15 0324)  O2 Liter Flow: 3 Lpm (01/15 0324)  BP: (91-122)/(45-71)   Temp:  [36.3 ?C (97.3 ?F)-36.9 ?C (98.5 ?F)]   Pulse:  [81-92]   Respirations:  [16 PER MINUTE-18 PER MINUTE]   SpO2:  [92 %-100 %]   O2 Device: Nasal cannula  O2 Liter Flow: 3 Lpm           Wt Readings from Last 10 Encounters:   05/12/23 74.1 kg (163 lb 6.4 oz)   05/06/23 74.2 kg (163 lb 9.6 oz)   04/23/23 76 kg (167 lb 9.6 oz)   04/15/23 77.6 kg (171 lb)   04/06/23 75.9 kg (167 lb 5.3 oz)   03/26/23 74.2 kg (163 lb 9.3 oz)   03/19/23 77 kg (169 lb 12.8 oz)   02/16/23 75.3 kg (166 lb)   02/15/23 75.3 kg (166 lb)   01/04/23 75.2 kg (165 lb 12.8 oz)       Physical Exam:    General Appearance: no distress, sleepy  Neck Veins: JVP 10cm, HJR positive?, severe TR and JVD difficult to assess  Respiratory: breathing comfortably, lungs clear to auscultation with diminished breath sounds posterior lower lobes, no rales or rhonchi, no wheezing, on 2L O2  Cardiac Auscultation: Regular rhythm, S1, S2, no S3 or S4, 2/6 murmur  CVC to right chest  Lower Extremity Edema: no lower extremity edema  Abdominal Exam: soft, non-tender, bowel sounds normal  Orientation: usually clear historian, good insight    Laboratory Review:   CBC w/Diff    Lab Results   Component Value Date/Time    WBC 10.30 05/12/2023 03:19 AM    RBC 2.99 (L) 05/12/2023 03:19 AM    HGB 8.4 (L) 05/12/2023 03:19 AM    HCT 26.5 (L) 05/12/2023 03:19 AM    MCV 88.8 05/12/2023 03:19 AM    MCH 28.2 05/12/2023 03:19 AM    MCHC 31.8 (L) 05/12/2023 03:19 AM    RDW 23.9 (H) 05/12/2023 03:19 AM    PLTCT 272 05/12/2023 03:19 AM    MPV 9.5 05/12/2023 03:19 AM    Lab Results   Component Value Date/Time    NEUT 66 05/11/2023 07:07 AM    ANC 7.3 (H) 05/12/2023 03:19 AM    LYMA 22 (L) 05/11/2023 07:07 AM    ALC 2.30 05/11/2023 07:07 AM    MONA 8 05/11/2023 07:07 AM    AMC 0.90 (H) 05/11/2023 07:07 AM    EOSA 2 05/11/2023 07:07 AM    AEC 0.20 05/11/2023 07:07 AM    BASA 1 05/11/2023 07:07 AM    ABC 0.10 05/11/2023 07:07 AM         Chemistry    Lab Results   Component Value Date/Time    NA 140 05/12/2023 03:19 AM    K 4.3 05/12/2023 03:19 AM    CL 100 05/12/2023 03:19 AM    CO2 24 05/12/2023 03:19 AM    GAP 16 (H) 05/12/2023 03:19 AM    BUN 60 (H) 05/12/2023 03:19 AM    CR 3.66 (H) 05/12/2023 03:19 AM    GLU 212 (H) 05/12/2023 03:19 AM    MG 1.7 05/12/2023 03:19 AM    Lab Results   Component Value Date/Time    CA 9.2 05/12/2023 03:19 AM    PO4 4.0 04/27/2023 06:29 PM    ALBUMIN 3.5 05/12/2023 03:19 AM    TOTPROT 5.8 (L) 05/12/2023 03:19 AM    ALKPHOS 113 (H) 05/12/2023 03:19 AM    AST 122 (H) 05/12/2023 03:19 AM    ALT 78 (H) 05/12/2023 03:19 AM    TOTBILI 0.9 05/12/2023 03:19 AM    GFR 12 (L) 05/12/2023 03:19 AM            Renal Function    Lab Results   Component Value Date/Time    NA 140 05/12/2023 03:19 AM    K 4.3 05/12/2023 03:19 AM    CL 100 05/12/2023 03:19 AM    CO2 24 05/12/2023 03:19 AM    GAP 16 (H) 05/12/2023 03:19 AM    BUN 60 (H) 05/12/2023 03:19 AM    BUN 56 (H) 05/11/2023 07:07 AM    BUN 48 (H) 05/10/2023 05:37 AM    Lab Results   Component Value Date/Time    CR 3.66 (H) 05/12/2023 03:19 AM    CR 3.38 (H) 05/11/2023 07:07 AM    CR 2.85 (H) 05/10/2023 05:37 AM    GLU 212 (H) 05/12/2023 03:19 AM    CA 9.2 05/12/2023  03:19 AM    PO4 4.0 04/27/2023 06:29 PM    ALBUMIN 3.5 05/12/2023 03:19 AM        Lipid Profile INR   Lab Results   Component Value Date    CHOL 107 09/16/2022    TRIG 72 09/16/2022    HDL 50 09/16/2022    LDL 45 09/16/2022    VLDL 14 09/16/2022    NONHDLCHOL 57 09/16/2022         Lab Results   Component Value Date    INR 1.4 (H) 05/04/2023          Chest X-Ray 1/13:  Moderate cardiomegaly with indistinct vasculature and slightly improved   perihilar and bibasilar opacities consistent with CHF/volume overload and   slightly improved pulmonary edema.       CT abdomen/pelvis 05/10/23:  1. Development of mild peripancreatic thickening/stranding that most   likely represents acute pancreatitis.   2. Moderate thickening/stranding has redeveloped adjacent to the region of   the gastric outlet and duodenum, which was also present in May 2024 and   which likely represents an additional manifestation of the pancreatitis.    Gastritis or duodenitis or less likely.   3. Development of mild interstitial edema in the lung bases. Additional   patchy interstitial and alveolar opacities that are new may represent   additional manifestations of edema or infectious pneumonitis.   4. Development of mild ill-defined stranding/edema within the pelvis   extraperitoneal spaces that either represents edema or inflammation.   5. Unchanged bilobed cystic lesion in the tail of the pancreas.   Additional, adjacent smaller cystic lesions and ductal dilation has   slightly decreased.       Tele:  SR, HR 90s    ECG: SR with 1st degree AV block, HR 84

## 2023-05-12 NOTE — Progress Notes
General Progress Note    Name: Raven [name]        MRN: [id number]          DOB: November 21, 1945            Age: 78 y.o.  Admission Date: 05/10/2023       LOS: 2 days    Date of Service: 05/12/2023    Assessment/Plan:      78 y.o. 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, s/p CRT-D in situ, severe mitral regurgitation, carotid artery disease s/p endarterectomy CKD stage IV, COPD, OSA with CPAP use, multiple myeloma on daratumumab, history of endometrial cancer s/p hysterectomy, diverticulosis s/p sigmoid colon mass resection, recent admit for GI bleed presented with  worsening dyspnea since her latest admission accompanied by increased Pro-BNP        Acute on chronic HFrEF  Chronic cardiogenic shock, on chronic milrinone  Ischemic cardiomyopathy  CAD s/p CABG and PCI  CRT-D in situ  PAF  - Patient had multiple admissions this year with 6 hospitalizations in last 6 weeks. Most recent hospitalization 12/23-27 with HF exacerbation. Also was found to have RSV infection at that time  - Echo from 04/12/2023 revealed EF of 30% with grade 3 diastolic dysfunction and severe mitral and tricuspid valve regurgitation  - EKG revealed sinus rhythm with first-degree AV block and PVCs  - NT proBNP elevated at 12K. Has been >10k at baseline. Most recently 11,636 04/19/23  - Chest x-ray with pulmonary edema  - Suspect shortness of breath likely has a component of heart failure exacerbation  - PTA on torsemide 20 mg BID  - Was given dose of 80 mg lasix in ED and 3 mg of bumex IV later in afternoon with ~574ml output  - Continue PTA milrinon infusion, amiodarone  - GDMT limited due to chronic cardiogenic shock and renal dysfunction   - Will hold further diuresis due to worsening renal function  - Cardiology HF consulted. Discussed and plan to hold diuretics and proceed with RHC today  - Hold PTA eliquis for procedure  - Palliative care consulted and patient wants to continue with aggressive GOC      Pancreatic cyst  - CT abdomen/pelvis from 04/02/2023 revealed mild increase in size of indeterminant large lobulated cystic mass in the tail of the pancreas  - Endoscopic ultrasound recommended  - Follow-up with GI as outpatient  - CT Abdomen/Pelvis 1/13: Mild peripancreatic thickening/stranding that most likely represents acute pancreatitis. Moderate thickening/stranding has redeveloped adjacent to the region of the gastric outlet and duodenum, which was also present in May 2024 and which likely represents an additional manifestation of the pancreatitis.  - No abdominal pain, and lipase is normal.   - Will defer treatment for pancreatitis at this time, in favor of treating suspected UTI and Acute on chronic HFrEF exacerbation.    Elevated LFTs  - Monitor    UTI  - Follow urine culture  - Continue Ceftriaxone     AKI on CKD stage IV  - Baseline creatinine ~2.4, noted creatinine up to 3.66  - Monitor renal function closely  - Avoid nephrotoxins  - Will ask renal consult due to progression of CKD and not been a good candidate for HD         Multiple myeloma  Chronic anemia  - Follows at [facility] and currently on Daratumumab  - Continue PTA  acyclovir for prophylaxis  - Hgb stable     DM type II  - Most recent hemoglobin A1c from 04/20/2023 was 6.3  - Blood glucose under well control.   - LDCF while admitted     COPD  Pulmonary nodules  - Continue PTA albuterol and Symbicort  - Follow-up for pulmonary nodules recommended after 6 to 8 weeks     FEN  - no IVF  - electrolytes stable  - NPO at midnight for RHC    VTE prophylaxis: eliquis    Code Status: Full Code, confirmed with patient on admission. Palliative care consulted     Disposition: continue admit to Medicine    High medical decision making due to the following: 1 acute or chronic illness that poses a threat to life or bodily function. Review of notes outside of my specialty, review of each unique test, ordering of each unique test, assessment requiring an independent historian, independent interpretation of a test and discussion of management or test interpretation with physicians or other qualified health care professional outside of my specialty, drug therapy requiring intensive monitoring for toxicity and decision regarding hospitalization                                           _______________________________________________________________________    Subjective   Raven [name] is a 78 y.o. female.  Patient complains of intermittent non productive cough, shortness of breath with movement. Denies fevers chest or abdominal pain, nausea, emesis.          Medications  Scheduled Meds:acyclovir (ZOVIRAX) tablet 400 mg, 400 mg, Oral, BID  albuterol sulfate (PROAIR HFA) inhaler 2 puff, 2 puff, Inhalation, BID & PRN  amiodarone (CORDARONE) tablet 200 mg, 200 mg, Oral, QDAY  [Held by Provider] apixaban (ELIQUIS) tablet 5 mg, 5 mg, Oral, BID  benzonatate (TESSALON PERLES) capsule 100 mg, 100 mg, Oral, TID  budesonide-formoterol HFA (SYMBICORT) 160-4.5 mcg/actuation inhalation 2 puff, 2 puff, Inhalation, BID  cefTRIAXone (ROCEPHIN) IVP 1 g, 1 g, Intravenous, Q24H*  CHOLEcalciferoL (vitamin D3) tablet 1,000 Units, 1,000 Units, Oral, QDAY  cyanocobalamin (vitamin B-12) tablet 5,000 mcg, 5,000 mcg, Oral, QDAY  [START ON 05/13/2023] dexAMETHasone (DECADRON) tablet 8 mg, 8 mg, Oral, Q7 Days  ferrous sulfate (FEOSOL) tablet 325 mg, 325 mg, Oral, QDAY  influenza (=>65 Yo) (TRIvalent HIGH DOSE (FLUZONE)) 180 mcg/0.5 mL 2024-25 PF syringe 0.5 mL, 0.5 mL, Intramuscular, ONCE  insulin aspart (U-100) (NOVOLOG FLEXPEN U-100 INSULIN) injection PEN 0-6 Units, 0-6 Units, Subcutaneous, ACHS (22)  lidocaine (LIDODERM) 5 % topical patch 1 patch, 1 patch, Topical, QDAY  magnesium oxide (MAGOX) tablet 400 mg, 400 mg, Oral, QHS  pantoprazole DR (PROTONIX) tablet 40 mg, 40 mg, Oral, BID  rosuvastatin (CRESTOR) tablet 10 mg, 10 mg, Oral, QDAY  scopolamine (TRANSDERM-SCOP) 1mg  over 3 days patch 1 patch, 1 patch, Transdermal, Q72H*   And  Verification of Patch Placement and Integrity - Scopolamine base 1 mg/3 days 1 patch, 1 patch, Transdermal, BID  sertraline (ZOLOFT) tablet 50 mg, 50 mg, Oral, QDAY    Continuous Infusions:   milrinone (PRIMACOR) 20 mg/D5W 100 mL infusion 0.3 mcg/kg/min (05/11/23 1535)     PRN and Respiratory Meds:acetaminophen Q8H PRN, dextromethorphan-guaiFENesin Q4H PRN, dextrose 50% (D50) IV PRN, eucalyptus-menthoL Q2H PRN, melatonin QHS PRN, nitroglycerin Q5 MIN PRN, ondansetron Q6H PRN **OR** ondansetron (ZOFRAN) IV Q6H PRN, polyethylene glycol 3350 QDAY  PRN, sennosides-docusate sodium QDAY PRN                Objective                        Vital Signs: Last Filed                 Vital Signs: 24 Hour Range   BP: 103/54 (01/15 0324)  Temp: 36.5 ?C (97.7 ?F) (01/15 5573)  Pulse: 88 (01/15 0324)  Respirations: 18 PER MINUTE (01/15 0324)  SpO2: 100 % (01/15 0324)  O2 Device: Nasal cannula (01/15 0324)  O2 Liter Flow: 3 Lpm (01/15 0324) BP: (91-122)/(45-71)   Temp:  [36.3 ?C (97.3 ?F)-36.9 ?C (98.5 ?F)]   Pulse:  [81-92]   Respirations:  [16 PER MINUTE-18 PER MINUTE]   SpO2:  [92 %-100 %]   O2 Device: Nasal cannula  O2 Liter Flow: 3 Lpm     Vitals:    05/11/23 0404 05/11/23 1317 05/12/23 0330   Weight: 73.1 kg (161 lb 3.2 oz) 73.4 kg (161 lb 12.8 oz) 74.1 kg (163 lb 6.4 oz)       Intake/Output Summary:  (Last 24 hours)    Intake/Output Summary (Last 24 hours) at 05/12/2023 0554  Last data filed at 05/11/2023 2314  Gross per 24 hour   Intake 1000 ml   Output 300 ml   Net 700 ml     Stool Occurrence: 1        Physical Exam  General: 78 y.o. female, cooperative, no distress, appears stated age  HEENT: atraumatic. Conjunctivae/corneas clear. PERRL, EOMs intact. MM moist.   Lungs: bilateral lower lung field rales  Heart: Regular rate and rhythm, no murmur  Abdomen: Soft, non-tender. Bowel sounds normal. No masses. No organomegaly.  Extremities: 2+ lower extremity edema  Skin: warm, dry. No rashes or lesions  Neurologic: Non focal     Lab Review  Pertinent labs reviewed    Point of Care Testing  (Last 24 hours)  Glucose: (!) 212 (05/12/23 0319)  POC Glucose (Download): (!) 189 (05/11/23 2113)    Radiology and other Diagnostics Review:    Pertinent radiology reviewed.       Michele Mcalpine, MD   [facility] Pager [phone]

## 2023-05-12 NOTE — Telephone Encounter
HH orders from [facility] received for ZUS to sign.. completed and returned with fax confirmation.

## 2023-05-12 NOTE — Care Plan
Problem: Discharge Planning  Goal: Participation in plan of care  Outcome: Goal Ongoing  Goal: Knowledge regarding plan of care  Outcome: Goal Ongoing  Goal: Prepared for discharge  Outcome: Goal Ongoing     Problem: Moderate Fall Risk  Goal: Moderate Fall Risk  Outcome: Goal Ongoing

## 2023-05-12 NOTE — Consults
Renal Consult    Raven [name]  Admission Date:  05/10/2023         Assessment and Plan     Principal Problem:    Acute pancreatitis  Active Problems:    Multiple myeloma (HCC)    Pancreatic cyst    Receiving inotropic medication    S/P drug eluting coronary stent placement    Type 2 diabetes mellitus without complications (HCC)    Chronic kidney disease, stage 4 (severe) (HCC)    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    UTI (urinary tract infection)        Raven [name] is a 78 y.o. female    AKI on CKD IV  - creatinine recent baseline 2.24-3.3  - has DM, multiple myeloma, heart failure EF 30% on milrinone  - creatinine bumped from 2.85 on 1/13 to 3.38 yesterday and as 3.66 today at the time of consult.   - her UA on 1/13 shows packed WBC and RBC, 2+ pro and appears most consistent with an infection but UA 1/13 was negative.   - She had a CT 1/13 and this shows an atrophic R kidney, a normal size L kidney without hydronephrosis, her L kidney has a hemorrhagic cyst.   - on milrinone  - will check a fena   - no current indications for dialysis  - if her renal function continues to worsen could consider dialysis on a trial bases. She is able to ambulate with help so her functional status is acceptable. The only question is if she could tolerate dialysis due to her heart failure and milrinone. We would have to assess if she could tolerate it. I checked with our dialysis staff and the medication is not prohibited within the dialysis department. I did not find any data on the use of this medication in dialysis.     Pancreatic cyst with pancreatitis   - noted on CT 05/10/23    ICM  - EF 30% and on home milrinone     DM II  COPD    Recommendations:   - at this time she does not have acute dialysis indications  - I do not see any recent insults. Milrinone has risk of renal failure and her underling  heart failure may also be contributing.   - If she were to develop dialysis indications it is not clear if she could tolerate dialysis but could be started on a trial basis to see if she tolerates.    Heywood Footman, DO  Pager [phone]      History     Reason for Consult: Pt with advanced HF now with worsening renal function, likely poor candidate for HD. Please advise management.     HPI: Raven [name] is a 78 y.o. female  with history of COPD, HLD, DM, multiple myeloma, hypertension, and CKD IV who has had worsening of her renal function over the last 2 days. She has many family present and one of her daughters is a Engineer, civil (consulting) who asked good questions. Raven [name] has not had kidney disease run in her family but her husband had been on dialysis and several of his family members have so the family is familiar with dialysis. Raven [name] reports she has urinated 2 times by mid day today. She denies any urinary symptoms except for some urgency. She is sitting up in bed and not reclined in it. She is able to ambulate with some assistance. She does not have  edema. She is on oxygen and denies dyspnea at this time.       Past Medical History   Past Medical History:    Anemia    Anxiety    Arthritis    Asthma    Asymptomatic stenosis of right carotid artery    Atrial fibrillation (HCC)    Back pain    Cancer of uterus (HCC)    Chronic kidney disease    COPD (chronic obstructive pulmonary disease) (HCC)    Coronary artery disease    Diabetes mellitus (HCC)    Dyslipidemia    Flank mass    Fluid retention    GI bleed    Heart disease    Heart failure (HCC)    Hypertension    Multiple myeloma (HCC)    OSA on CPAP    Other malignant neoplasm without specification of site    Vision decreased       Surgical History:   Procedure Laterality Date    CORONARY ARTERY BYPASS GRAFT  2010    CABG 3 V    ROBOT ASSISTED LOW ANTERIOR RESECTION N/A 08/10/2022    Performed by Benetta Spar, MD at [facility] OR    ESOPHAGOGASTRODUODENOSCOPY WITH BIOPSY - FLEXIBLE N/A 09/17/2022    Performed by Tempie Hoist, DO at [facility] ENDO    CATHETERIZATION RIGHT HEART N/A 09/18/2022    Performed by Cath, Physician at [facility] CATH LAB    REMOVAL AND REPLACEMENT IMPLANTABLE DEFIBRILLATOR GENERATOR - SINGLE LEAD SYSTEM Left 11/04/2022    Performed by Kathreen Cornfield, MD at [facility] EP LAB    INSERTION/ REPLACEMENT PERMANENT PACEMAKER WITH ATRIAL LEAD Left 11/04/2022    Performed by Kathreen Cornfield, MD at [facility] EP LAB    Injection Venography Extremity Left 11/04/2022    Performed by Kathreen Cornfield, MD at [facility] EP LAB    ESOPHAGOGASTRODUODENOSCOPY WITH BIOPSY - FLEXIBLE N/A 03/29/2023    Performed by Buckles, Vinnie Level, MD at [facility] ENDO    ESOPHAGOGASTRODUODENOSCOPY WITH SNARE REMOVAL TUMOR/ POLYP/ OTHER LESION - FLEXIBLE N/A 03/29/2023    Performed by Buckles, Vinnie Level, MD at [facility] ENDO    ESOPHAGOGASTRODUODENOSCOPY WITH CONTROL OF BLEEDING - FLEXIBLE N/A 04/04/2023    Performed by Buckles, Vinnie Level, MD at [facility] ENDO    COLONOSCOPY WITH BIOPSY - FLEXIBLE N/A 04/30/2023    Performed by Onnie Boer, MD at [facility] ENDO    BONE MARROW BIOPSY      CARDIAC DEFIBRILLATOR PLACEMENT      [employer]    CAROTID ENDARDECTOMY Right     COLONOSCOPY      HX CORONARY STENT PLACEMENT      HX HEART CATHETERIZATION      HX HYSTERECTOMY      TUNNELED VENOUS PORT PLACEMENT Right     Chest       Family History  Family History   Problem Relation Name Age of Onset    Cancer Mother      Diabetes Mother      Hypertension Mother      Cancer-Breast Sister      Cancer-Ovarian Sister      Cancer Sister      Diabetes Sister      Heart Disease Sister      Hypertension Sister      Heart Disease Brother      Hypertension Brother      Stroke Brother         Social History  Social History     Socioeconomic History    Marital status: Widowed   Tobacco Use    Smoking status: Former     Current packs/day: 0.00     Average packs/day: 1 pack/day for 20.0 years (20.0 ttl pk-yrs)     Types: Cigarettes     Start date: 44     Quit date: 2010     Years since quitting: 15.0     Passive exposure: Never    Smokeless tobacco: Never   Vaping Use    Vaping status: Never Used   Substance and Sexual Activity    Alcohol use: Not Currently    Drug use: Not Currently          Medications  MEDSacyclovir, 400 mg, Oral, BID  albuterol sulfate, 2 puff, Inhalation, BID & PRN  amiodarone, 200 mg, Oral, QDAY  [Held by Provider] apixaban, 5 mg, Oral, BID  benzonatate, 100 mg, Oral, TID  budesonide-formoterol HFA, 2 puff, Inhalation, BID  cefTRIAXone  (ROCEPHIN)  injection (IV or IM), 1 g, Intravenous, Q24H*  CHOLEcalciferoL (vitamin D3), 1,000 Units, Oral, QDAY  cyanocobalamin (vitamin B-12), 5,000 mcg, Oral, QDAY  [START ON 05/13/2023] dexAMETHasone, 8 mg, Oral, Q7 Days  ferrous sulfate, 325 mg, Oral, QDAY  insulin aspart (NOVOLOG) injection, 0-6 Units, Subcutaneous, ACHS (22)  lidocaine, 1 patch, Topical, QDAY  magnesium oxide, 400 mg, Oral, QHS  pantoprazole DR, 40 mg, Oral, BID  rosuvastatin, 10 mg, Oral, QDAY  scopolamine (TRANSDERM-SCOP) patch, 1 patch, Transdermal, Q72H*   And  Verification of Patch Placement and Integrity - Scopolamine base 1 mg/3 days, 1 patch, Transdermal, BID  sertraline, 50 mg, Oral, QDAY     IV MEDS   milrinone (PRIMACOR) 20 mg/D5W 100 mL infusion 0.3 mcg/kg/min (05/12/23 0619)     Prn acetaminophen Q8H PRN, dextromethorphan-guaiFENesin Q4H PRN 10 mL at 05/12/23 0623, dextrose 50% (D50) IV PRN, eucalyptus-menthoL Q2H PRN 1 lozenge at 05/11/23 2007, melatonin QHS PRN 5 mg at 05/11/23 2003, nitroglycerin Q5 MIN PRN, ondansetron Q6H PRN **OR** ondansetron (ZOFRAN) IV Q6H PRN, polyethylene glycol 3350 QDAY PRN, sennosides-docusate sodium QDAY PRN     HOME MEDS  Prior to Admission Medications   Prescriptions Last Dose Informant Patient Reported? Taking?   EMU OIL (BULK) MISC Past Week Self Yes Yes   Sig: Use  as directed. Apply topically as needed   acetaminophen (TYLENOL) 325 mg tablet Past Week Self No Yes   Sig: Take three tablets by mouth every 8 hours as needed for Pain. Indications: pain   acyclovir (ZOVIRAX) 400 mg tablet 05/09/2023 Evening Self Yes Yes   Sig: Take one tablet by mouth twice daily.   albuterol sulfate (PROAIR HFA) 90 mcg/actuation HFA aerosol inhaler Past Week Self Yes Yes   Sig: Inhale two puffs by mouth into the lungs every 6 hours as needed for Wheezing or Shortness of Breath.   amiodarone (CORDARONE) 200 mg tablet 05/09/2023 Morning Self No Yes   Sig: Take one tablet by mouth daily. Indications: prevention of recurrent atrial fibrillation   apixaban (ELIQUIS) 5 mg tablet 05/09/2023 Evening Self No Yes   Sig: Take one tablet by mouth twice daily. Indications: afib   budesonide-formoterol HFA (SYMBICORT) 160-4.5 mcg/actuation aerosol inhaler 05/09/2023 Evening Self No Yes   Sig: Inhale two puffs by mouth into the lungs twice daily. Indications: bronchospasm prevention with COPD   cholecalciferol (VITAMIN D) 1,000 units tablet 05/09/2023 Morning Self Yes Yes   Sig: Take one tablet by mouth daily.  cyanocobalamin (VITAMIN B-12) 1,000 mcg tablet 05/09/2023 Morning Self Yes Yes   Sig: Take 5,000 Units by mouth daily.   dexAMETHasone (DECADRON) 4 mg tablet Not Taking Self Yes No   Sig: Take two tablets by mouth every 7 days. Patient is taking 8mg  on Thursdays prior to chemo   Patient not taking: Reported on 05/10/2023   dextromethorphan-guaiFENesin (ROBITUSSIN-DM) 10-100 mg/5 mL oral syrup Past Month Self No Yes   Sig: Take 10 mL by mouth every 4 hours as needed. Indications: cough   febuxostat (ULORIC) 40 mg tablet 05/09/2023 Morning Self No Yes   Sig: Take one-half tablet by mouth daily. Indications: taken on chemo days   ferrous sulfate (FEOSOL) 325 mg (65 mg iron) tablet 05/09/2023 Morning Self Yes Yes   Sig: Take one tablet by mouth daily. Take on an empty stomach at least 1 hour before or 2 hours after food.   magnesium oxide (MAGOX) 400 mg (241.3 mg magnesium) tablet 05/09/2023 Bedtime Self No Yes   Sig: Take one tablet by mouth at bedtime daily. Indications: low amount of magnesium in the blood   milrinone (PRIMACOR) 200 mcg/mL infusion 05/10/2023 Morning Self No Yes   Sig: 0.3 mcg/kg/min ? 78.3 kg continuous  Indications: sudden and serious symptoms of heart failure called acute decompensated heart failure   nitroglycerin (NITROSTAT) 0.4 mg tablet Unknown Self Yes Yes   Sig: Place one tablet under tongue every 5 minutes as needed for Chest Pain.   pantoprazole DR (PROTONIX) 40 mg tablet 05/09/2023 Evening Self No Yes   Sig: Take one tablet by mouth twice daily. Indications: gastritis   potassium chloride SR (K-DUR) 20 mEq tablet 05/09/2023 Evening Self No Yes   Sig: Take two tablets by mouth twice daily. Indications: low amount of potassium in the blood   rosuvastatin (CRESTOR) 20 mg tablet 05/09/2023 Morning Self No Yes   Sig: Take one-half tablet by mouth daily. Indications: excessive fat in the blood   sertraline (ZOLOFT) 50 mg tablet 05/09/2023 Morning Self Yes Yes   Sig: Take one tablet by mouth daily.   torsemide (DEMADEX) 20 mg tablet 05/09/2023 Morning Self No Yes   Sig: Take two tablets by mouth twice daily. Indications: accumulation of fluid resulting from chronic heart failure      Facility-Administered Medications: None              Physical Exam        Vital Signs: Last Filed In 24 Hours Vital Signs: 24 Hour Range   BP: 107/55 (01/15 1143)  Temp: 36.3 ?C (97.3 ?F) (01/15 1143)  Pulse: 87 (01/15 1143)  Respirations: 16 PER MINUTE (01/15 1143)  SpO2: 98 % (01/15 1143)  O2 Device: Nasal cannula (01/15 1143)  O2 Liter Flow: 3 Lpm (01/15 1143) BP: (93-122)/(45-71)   Temp:  [36.3 ?C (97.3 ?F)-36.9 ?C (98.5 ?F)]   Pulse:  [87-92]   Respirations:  [8 PER MINUTE-18 PER MINUTE]   SpO2:  [96 %-100 %]   O2 Device: Nasal cannula  O2 Liter Flow: 3 Lpm          Vitals:    05/11/23 0404 05/11/23 1317 05/12/23 0330   Weight: 73.1 kg (161 lb 3.2 oz) 73.4 kg (161 lb 12.8 oz) 74.1 kg (163 lb 6.4 oz)       Constitutional: Appears elderly, well-developed and well-nourished. No distress.   Head: Normocephalic and atraumatic.   Mouth/Throat: Moist oral mucosa  Eyes: No scleral icterus or injection.   Neck: No  JVD present. No tracheal deviation present.   Cardiovascular: Normal rate, regular rhythm, normal heart sounds and intact distal pulses.  No murmur.   Pulmonary/Chest: Effort normal. No wheezes and no rales. On O2 by NC  Abdominal: Soft. Bowel sounds are normal. No distension. There is no tenderness or rebound.   Musculoskeletal: No edema. No cyanosis or clubbing.   Neurological: Alert. Conversant  Skin: Skin is warm and dry. No rash noted.     Labs     Recent Labs     05/10/23  0537 05/10/23  1007 05/11/23  0707 05/12/23  0319   NA 145  --  143 140   K 5.3*  --  5.1 4.3   CL 105  --  102 100   CO2 24  --  29 24   GAP 16*  --  12 16*   BUN 48*  --  56* 60*   CR 2.85*  --  3.38* 3.66*   GLU 141*  --  154* 212*   CA 8.8  --  9.0 9.2   ALBUMIN 3.9  --  3.4* 3.5   MG  --  1.7 1.7 1.7       Recent Labs     05/10/23  0537 05/11/23  0707 05/12/23  0319   WBC 10.70 10.20 10.30   HGB 9.6* 8.8* 8.4*   HCT 30.2* 27.8* 26.5*   PLTCT 288 257 272   AST 40 75* 122*   ALT 15 41 78*   ALKPHOS 105 101 113*      Estimated Creatinine Clearance: 12.1 mL/min (A) (based on SCr of 3.66 mg/dL (H)).  Vitals:    05/11/23 0404 05/11/23 1317 05/12/23 0330   Weight: 73.1 kg (161 lb 3.2 oz) 73.4 kg (161 lb 12.8 oz) 74.1 kg (163 lb 6.4 oz)    No results for input(s): PHART, PO2ART in the last 72 hours.    Invalid input(s): PC02A                        Radiology     Pertinent radiology reviewed.    Heywood Footman, DO  Pager [phone]

## 2023-05-12 NOTE — Progress Notes
Pre-Procedure Airway Assessment     Planned Procedure:     Right Heart Catheterization    Time of last oral intake: > 8hrs      Assessment: Airway assessment performed and No abnormalities    Note: If any factors present an anesthesia consult should be considered    Malampatti: III    ASA Class: ASA IV (A patient with an incapacitating systemic disease that is a constant threat to life)    Physician has discussed risks and alternatives of this type sedation and above planned procedure(s) with: Patient    Allergies:   Allergies   Allergen Reactions    Bortezomib RASH     Noted in Provider note 8/22 cancer treatment drug    Allopurinol HIVES    Sulfa (Sulfonamide Antibiotics) NAUSEA ONLY        Current Medications: Reviewed    Appropriate labs/diagnostic tests: Reviewed    Have the patient or anyone in the patient's family ever had a history of sedation/anesthesia complications? No

## 2023-05-13 ENCOUNTER — Inpatient Hospital Stay: Admit: 2023-05-13 | Discharge: 2023-05-13 | Payer: MEDICARE

## 2023-05-13 ENCOUNTER — Encounter: Admit: 2023-05-13 | Discharge: 2023-05-13 | Payer: MEDICARE

## 2023-05-13 LAB — POC GLUCOSE
~~LOC~~ BKR POC GLUCOSE: 214 mg/dL — ABNORMAL HIGH (ref 70–100)
~~LOC~~ BKR POC GLUCOSE: 175 mg/dL — ABNORMAL HIGH (ref 70–100)
~~LOC~~ BKR POC GLUCOSE: 180 mg/dL — ABNORMAL HIGH (ref 70–100)

## 2023-05-13 LAB — MANUAL DIFF
~~LOC~~ BKR LYMPHOCYTES - RELATIVE: 5 % — ABNORMAL LOW (ref 24–44)
~~LOC~~ BKR METAMYELOCYTES - RELATIVE: 1 %
~~LOC~~ BKR MONOCYTES - RELATIVE: 4 % (ref 4–12)
~~LOC~~ BKR NEUT+BANDS - ABSOLUTE: 8.2 10*3/uL — ABNORMAL HIGH (ref 1.8–7.0)
~~LOC~~ BKR NEUTROPHILS - RELATIVE: 90 % — ABNORMAL HIGH (ref 41–77)
~~LOC~~ BKR NUCLEATED RBC MANUAL: 1 10*3/uL

## 2023-05-13 LAB — BASIC METABOLIC PANEL
~~LOC~~ BKR ANION GAP: 14 — ABNORMAL HIGH (ref 3–12)
~~LOC~~ BKR BLD UREA NITROGEN: 61 mg/dL — ABNORMAL HIGH (ref 7–25)
~~LOC~~ BKR CALCIUM: 9.6 mg/dL (ref 8.5–10.6)
~~LOC~~ BKR CHLORIDE: 99 mmol/L (ref 98–110)
~~LOC~~ BKR CO2: 28 mmol/L (ref 21–30)
~~LOC~~ BKR CREATININE: 3.8 mg/dL — ABNORMAL HIGH (ref 0.40–1.00)
~~LOC~~ BKR GLOMERULAR FILTRATION RATE (GFR): 12 mL/min — ABNORMAL LOW (ref >60)
~~LOC~~ BKR GLUCOSE, RANDOM: 196 mg/dL — ABNORMAL HIGH (ref 70–100)
~~LOC~~ BKR SODIUM, SERUM: 141 mmol/L (ref 137–147)

## 2023-05-13 LAB — CREATININE-URINE RANDOM: ~~LOC~~ BKR UR CREATININE, RAN: 27 mg/dL

## 2023-05-13 LAB — SODIUM-URINE RANDOM: ~~LOC~~ BKR UR SODIUM, RAN: 88 mmol/L

## 2023-05-13 LAB — MAGNESIUM: ~~LOC~~ BKR MAGNESIUM: 2.3 mg/dL (ref 1.6–2.6)

## 2023-05-13 LAB — O2 SATURATION, MIXED VENOUS: ~~LOC~~ BKR O2 SAT, MIXED VEN: 44 %

## 2023-05-13 MED ORDER — BISACODYL 10 MG RE SUPP
10 mg | Freq: Every day | RECTAL | 0 refills | PRN
Start: 2023-05-13 — End: ?

## 2023-05-13 MED ORDER — CHLOROTHIAZIDE SODIUM 500 MG IV SOLR
500 mg | Freq: Once | INTRAVENOUS | 0 refills | Status: CP
Start: 2023-05-13 — End: ?
  Administered 2023-05-13: 15:00:00 500 mg via INTRAVENOUS

## 2023-05-13 MED ORDER — BUMETANIDE 0.25 MG/ML IJ SOLN
4 mg | Freq: Once | INTRAVENOUS | 0 refills | Status: CP
Start: 2023-05-13 — End: ?
  Administered 2023-05-13: 10:00:00 4 mg via INTRAVENOUS

## 2023-05-13 MED ORDER — HYDROMORPHONE 1 MG/ML PO LIQD
1 mg | ORAL | 0 refills | Status: DC | PRN
Start: 2023-05-13 — End: 2023-05-15
  Administered 2023-05-13 – 2023-05-14 (×2): 1 mg via ORAL

## 2023-05-13 MED ORDER — GLYCOPYRROLATE 0.2 MG/ML IJ SOLN
.4 mg | INTRAVENOUS | 0 refills | Status: DC | PRN
Start: 2023-05-13 — End: 2023-05-15
  Administered 2023-05-14: 02:00:00 0.4 mg via INTRAVENOUS

## 2023-05-13 MED ORDER — BUMETANIDE 0.25 MG/ML IJ SOLN
4 mg | Freq: Two times a day (BID) | INTRAVENOUS | 0 refills | Status: DC
Start: 2023-05-13 — End: 2023-05-15
  Administered 2023-05-13 – 2023-05-14 (×4): 4 mg via INTRAVENOUS

## 2023-05-13 MED ORDER — LORAZEPAM 2 MG/ML PO CONC
.5 mg | ORAL | 0 refills | Status: DC | PRN
Start: 2023-05-13 — End: 2023-05-15
  Administered 2023-05-13: 20:00:00 0.5 mg via ORAL

## 2023-05-13 MED ORDER — HYDROMORPHONE (PF) 2 MG/ML IJ SYRG
.2 mg | INTRAVENOUS | 0 refills | Status: DC | PRN
Start: 2023-05-13 — End: 2023-05-15
  Administered 2023-05-14: 02:00:00 0.2 mg via INTRAVENOUS

## 2023-05-13 MED ORDER — HALOPERIDOL LACTATE 2 MG/ML PO CONC
1 mg | ORAL | 0 refills | Status: DC | PRN
Start: 2023-05-13 — End: 2023-05-15

## 2023-05-13 MED ORDER — LORAZEPAM 2 MG/ML IJ SOLN GROUP
.5 mg | INTRAVENOUS | 0 refills | Status: DC | PRN
Start: 2023-05-13 — End: 2023-05-15

## 2023-05-13 MED ORDER — HALOPERIDOL LACTATE 5 MG/ML IJ SOLN
1 mg | INTRAVENOUS | 0 refills | Status: DC | PRN
Start: 2023-05-13 — End: 2023-05-15

## 2023-05-13 MED ADMIN — WATER FOR INJECTION, STERILE IJ SOLN [79513]: 18 mL | INTRAVENOUS | @ 16:00:00 | Stop: 2023-05-13 | NDC 00409488723

## 2023-05-13 MED ADMIN — WATER FOR INJECTION, STERILE IJ SOLN [79513]: 18 mL | INTRAVENOUS | @ 06:00:00 | Stop: 2023-05-13 | NDC 00409488723

## 2023-05-13 NOTE — Case Management (ED)
Case Management Progress Note    Palliative Care    NAME:Raven [name]                          MRN: [id number]              DOB:05-11-1945          AGE: 78 y.o.  ADMISSION DATE: 05/10/2023             DAYS ADMITTED: LOS: 3 days      Today's Date: 05/13/2023    PLAN: Patient transitioned to CMO - plan is home hospice VS hospice house    Expected Discharge Date: 05/14/2023   Is Patient Medically Stable: No, Please explain: Symptoms  Are there Barriers to Discharge? no    INTERVENTION/DISPOSITION:  Discharge Planning              Discharge Planning: Home Health  Transportation              Does the Patient Need Case Management to Arrange Discharge Transport? (ex: facility, ambulance, wheelchair/stretcher, Medicaid, cab, other): No  Will the Patient Use Family Transport?: Yes  Support                   Palliative team rounded on Patient - Patient resting in bed, multiple family members at bedside, including son, daughter, daughter in law, g-daughter, g-son, daughter on speaker.  Family very supportive of Patient - all in agreement to transition to CMO and pursue hospice care. Team provided education on home hospice Vs hospice house depending on Patient's needs    SW provided son with list of home hospice agencies that served Patient's zip code per [email or web address]  Son Adult nurse - acknowledge Patient had home health with [facility].  Family will review.  Family also considering hospice house locations if Patient requires IV medications.     SW updated primary CM team    Pall team will continue to follow    Info or Referral                 Positive SDOH Domains and Potential Barriers                   Medication Needs                                                 Financial                 Legal                 Other                 Discharge Disposition  Selected Continued Care - Admitted Since 05/10/2023       [facility] Coordination complete.      Service Provider Services Address Phone Fax Patient Preferred    [facility] -- [address] [place] [address] [phone] --                  Garnet Sierras, LMSW  [place]  Pager: [phone]  Office: [phone]

## 2023-05-13 NOTE — Progress Notes
PALLIATIVE CARE INPATIENT NOTE     Name: Raven [name]            MRN: [id number]                DOB: 06/24/1945          Age: 78 y.o.  Admission Date: 05/10/2023             LOS: 3 days    ASSESSMENT/PLAN     Raven [name] is a 78 y.o. 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 Frequent hospitalizations here with worsening cough and SOA. Palliative Care consulted for complex medical decision making.     #Acute on chronic HFrE,  EF: 30%   - She follows with Dr. Herma Carson. [name] and [name], APRN  #Chronic cardiogenic shock on chronic milrinone  #Ischemic cardiomyopathy  #CAD s/p CABG and PCI  #CRT-D in situ  #PAF  #Acute pancreatitis/Pancreatic cystic mass   #UTI  #Multiple myeloma   - She follows with [facility] oncology and currently on Daratumumab     #Goals of Care/Complex Medical Decision Making related to Heart Failure  Discussion:   Palliative team met with patient and several family members at bedside. Patient was transferred to CICU after LHC yesterday due to elevated filling pressures and reduced cardiac indices despite being on milrinone. Cardiology met with patient and family this morning and shared unfortunately there are not additional therapies to offer that would provide her benefit. See ACP note for full discussion.     RECOMMENDATIONS:   DNAR-CMO plan of care - see ACP note  Provided list of hospice agencies to family - they know next step is to pick an agency to send referral to   Will monitor medication needs in next 24h - if  needing IV meds, qualify for inpatient hospice otherwise will set up for home hospice  Discontinued milrinone  Continue amiodarone if able to take po  Continue Bumex 4mg  BID for SOA  Glyco prn secretions  Dilaudid oral solution 1mg  q1h prn OR IV back  up  Haldol oral solution 1mg  q2h prn OR IV back up for restlessness  Ativan oral solution 0.5mg  q2h prn anxiety OR IV back up  Continue cough regimen of mucinex, robitussin, tessalon pearles  Will need OSH-DNR prior to discharge  ICD turned off 1/16    PALLIATIVE CARE PLANNING     Advance Care Planning:   Identified Health Care Decision Maker -  Son [place], DIL [place], Daughter [name]   DPOA - Would benefit from completion of DPOA  TPOPP/OSHDNAR - Not discussed    PC Clinic - NA    Medication safety - NA    Disposition planning - Hospice home vs inpatient pending symptom needs    SUBJECTIVE     CC/Reason for Visit:goals, cough  Additional history from: chart    Interval Hx: Reports ongoing cough, was more productive today. Reports some pain in left leg, tender to touch      OBJECTIVE     Blood pressure 114/73, pulse 81, temperature 36.5 ?C (97.7 ?F), height 157.5 cm (5' 2), weight 70.1 kg (154 lb 9.6 oz), SpO2 99%.  Physical Exam  General: alert, sitting up in bed, appears chronically ill, appears tired  HEENT: Eyes open, non-icteric  CV: Regular rate  Pulm: Non-labored, productive cough  GI: Abd non-distended, non-tender to palpation, bowel sounds active  Neuro: Alert, answering questions appropriately, no seizures, no myoclonus  Psych: Calm    Lab Results:  CBC   Lab Results   Component Value Date/Time    WBC 9.10 05/13/2023 03:49 AM    HGB 8.4 (L) 05/13/2023 03:49 AM    PLTCT 262 05/13/2023 03:49 AM     Lab Results   Component Value Date/Time    NEUT 66 05/11/2023 07:07 AM    ANC 8.2 (H) 05/13/2023 03:49 AM      Chemistries   Lab Results   Component Value Date/Time    NA 142 05/13/2023 03:49 AM    K 3.6 05/13/2023 03:49 AM    BUN 61 (H) 05/13/2023 03:49 AM    CR 3.45 (H) 05/13/2023 03:49 AM    GLU 160 (H) 05/13/2023 03:49 AM     Lab Results   Component Value Date/Time    CA 9.0 05/13/2023 03:49 AM    PO4 4.0 04/27/2023 06:29 PM    ALBUMIN 3.4 (L) 05/13/2023 03:49 AM    TOTPROT 5.6 (L) 05/13/2023 03:49 AM    ALKPHOS 115 (H) 05/13/2023 03:49 AM    AST 108 (H) 05/13/2023 03:49 AM    ALT 88 (H) 05/13/2023 03:49 AM    TOTBILI 0.8 05/13/2023 03:49 AM    GFR 13 (L) 05/13/2023 03:49 AM        Other Pertinent Diagnostic Results:           Palliative Care Data:  Patient Location at Time of Consultation: Hospital - General floor (includes step-down,  pre-op)  Primary Diagnosis: Cardiovascular: Heart Failure    High medical decision making due to the following:  1 or more chronic illness with severe exacerbation  Review of notes outside of my specialty and Review of each unique test and discussion of management or test interpretation with CICU APRN (physician(s) or other qualified health care professional outside of my specialty)  IV controlled substances

## 2023-05-13 NOTE — Progress Notes
Heart Failure Progress Note    NAME:Raven [name]                                                                   MRN: [id number]                 DOB:1945/10/07          AGE: 78 y.o.  ADMISSION DATE: 05/10/2023             DAYS ADMITTED: LOS: 3 days      Chief Complaint:  Evaluation and recommendations re: heart failure.        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     She presented to the ED on 05/10/23 with complaints of not feeling well, dyspnea, dry cough, nausea/vomiting and dysuria.  Her NT-pro BNP was elevated at 15065 (previously 11379) and her troponins were slightly elevated at 49>40>39.  Her UA was suspicious for UTI, she was started on ceftriaxone.  CXR revealed volume overload with slightly improved pulmonary edema.  Her creatinine was slightly elevated from her baseline as well.  Unfortunately, this is her 6th hospitalization over the last 6 weeks and 9th admission over the past 9 months.  She was admitted to medicine with palliative and Heart Failure consults to assist with management.    She underwent RHC on 1/15 with RA 19, RV 72/7, PA 72/34(47), PCWP 36 v-wave 49, PA sat 38%, CO 3.25, CI 1.85 by fick. PAC was left in place and transferred to CICU for aggressive diuresis and symptom management.     Plan Today 05/13/2023:  Remove PAC and sheath  Downgrade to Tele  Re-engage palliative care to assist in GOC discussion with pt and family.  Pt to transition to CMO. Palliative care to provide list of hospice agencies.    No further options available from HF perspective. Will not qualify for MV replacement or CRT-D upgrade.  Maintain milrinone at PTA  dose for now. Tentatively wean off in upcoming days.  Aggressive IV diuresis for symptom management. IV Bumex 4 mg BID, Diuril 500 mg once.  Cont LDCF  Continue PTA acyclovir, inhalers, vit d3, vit b12, ferrous sulfate, sertraline, PPI.  Cont PTA amiodarone, crestor, apixiban.    CICU Course:  1/15 - Transfer to CICU with leave in [facility]; continue PTA milrinone, aggressive IV diuresis   1/16 - Remove PAC and sheath, Tele, re-engage hospice, IV diuresis, CMO    Assessment/Plan:   CARDIAC  Chronic cardiogenic shock  Acute on chronic systolic and diastolic HFrEF,  EF: 30%.  Ischemic cardiomyopathy  - Major Complications or Comorbidities [facility]): cardiogenic shock, acute/ acute on chronic systolic and/or diastolic heart failure, and cardiorenal syndrome (with documentation of CKD)  - NYHA functional class III (marked limitation of physical activity - comfortable at rest, but less than ordinary activity causes symptoms of HF e.g., getting dressed or standing from a sitting position),   - ACC Stage D (refractory HF requiring specialized interventions)  - Admission BNP:   Lab Results   Component Value Date    NTPROBNP 15,065 (H) 05/10/2023       Diuretic Therapy     Prior to admission dose    Torsemide 40mg  po BID   Given on admission    1/13: lasix 80mg  IV x 1; bumex 3mg  IV BID   Daily Dosing 1/14: hold with bump in creatinine  1/15: hold with further bump in creatinine, await RHC  1/15 PM: 4mg  IV Bumex  1/16: IV Bumex 4mg  BID, diuril 500 mg once      Guideline Directed Medical Therapy PTA Inpatient Changes   ACEI/ARB N/A-CKD IV      ARNI N/A-CKD IV      BB N/A-cardiogenic shock on home inotrope; previously tolerated Toprol XL which was discontinued recently     SGLT-2 Inhibitor None with CKD (dc'd mid-December), now with UTI     Mineralocorticoid Receptor Antagonist N/A-CKD IV      Hydralazine/Nitrate none     Ivabradine none     Heart Rhythm Management Therapy Yes (ICD)      Anticoagulation for Afib/flutter Elqiuis 5mg  po BID Held for RHC, resumed 1/15 PM   Cardiac Rehab Evaluation for LVEF < or equal to 40% Recently ordered 04/12/23     7-Day post hospital follow up scheduled within 24-48 hours of discharge No, expected to dc on Hospice           Testing/Procedures   05/12/23 RHC  HEMODYNAMICS:    Body surface area 1.75 sq m.  Hemoglobin 8.4 g/dL.  Heart rate 88 beats per minute.  Blood pressure 132/62 mmHg with mean pressure of 88 mmHg.     SATURATIONS:    AO saturation 97%.  RA saturation 39%.  PA saturation 38%.     PRESSURES:    RA pressure 19 mmHg.  RV pressure 72/7 mmHg with RVEDP of 19 mmHg.  PA pressure 72/34 mmHg with mean PA pressure of 47 mmHg.  Pulmonary capillary wedge pressure 36 mmHg with V-wave of 49 mmHg.  Transpulmonary gradient 11 mmHg.  Pulmonary vascular resistance 3.38 Wood units.  Systemic vascular resistance 1698 dynes/sec/cm -5.  Cardiac output utilizing Fick method was 3.25 L/minute and utilizing thermodilution method was 3.5 L/minute.  Cardiac index utilizing Fick method was 1.85 L/minute per sq m and utilizing thermodilution method was 2 L/minute per sq m.     FLUOROSCOPY TIME:  0.7 minutes.  AIR KERMA:  5.4 mGy.      My attending, Dr. Chales Abrahams, and interventional fellow, Dr. Carmelina Peal, were present for the entire case performing when necessary key portions of the procedure.     FINAL IMPRESSION:    Significant elevated right and left-sided filling pressures.  Reduced cardiac indices.     RECOMMENDATIONS:  The results were conveyed to referring physician, Dr. [name], who recommended leaving the [facility] in and admitting the patient to the CICU for further optimization.      Last PTA Echo 04/12/2023  Eccentric hypertrophy of the left ventricle with severe dilatation of the left ventricular chamber  Left ventricular systolic function is moderately to severely impaired with an estimate ejection fraction of 30% and global hypokinesis  Right ventricle is moderately dilated with normal function  The left atrium is severely dilated  Pacemaker lead seen in the right atrium and right ventricle  Severe mitral valve insufficiency  Severe tricuspid valve insufficiency  No pericardial effusion  Severe pulmonary hypertension with estimated peak systolic pulmonary artery pressure of 73 mmHg     In comparison to previous echocardiogram from May 2024 the mitral insufficiency was graded as moderate to severe although on my visual comparison it appears similar (severe) to the present study        - 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 0.3 mcg/kg/min (this was started in May 2025 after low cardiac index on right heart cath)  - GDMT limited by CKD and shock  - Initially received IV diuretics, held due to bump in creatinine until results of cath  - RHC 1/15 with elevated filling pressures and low cardiac index  - 9 hospitalizations in last 9 months, with 6 of those in last 6 weeks  - Palliative following, she continues to have aggressive care goals, finds benefit from symptom management during hospitalizations  Plan:  > Diuresis as above  > GDMT as above   > Remove PAC and sheath  > Re-engage palliative care to assist in GOC discussion with pt and family.  Pt to transition to CMO. Palliative care to provide list of hospice agencies.    No further options available from HF perspective. Will not qualify for MV replacement or CRT-D upgrade.  > Maintain milrinone at PTA dose for now. Tentatively wean off in upcoming days.    Paroxysmal A-fib  ICD  - ICD interrogation 04/29/23: AP <1%/VP <1% with presenting rhythm AS-VS 90s with elevated HF diagnostics   > PTA amiodarone  > Resume Eliquis    CAD s/p CABG x 3 (2010)  HLD  > PTA statin  > ASA and Plavix DC'd due to GIB    NEURO   Acute metabolic encephalopathy, improving  - Family reports intermittent confusion during this hospitalization (forgetting family members names), possibly related to UTI  - MAE; follows commands  Plan:  > Maintain sleep wake cycle  > Can use Melatonin as needed     PULM  COPD  - Oxygenating well on 2L NC  Plan:  > O2 as needed for sat > 92%  > PTA albuterol, symbicort      GI  Elevated LFTs  Pancreatic Cyst  - possibly in setting of shock  - CT abdomen/pelvis from 04/02/2023 revealed mild increase in size of indeterminant large lobulated cystic mass in the tail of the pancreas   - CT Abdomen/Pelvis 1/13: Mild peripancreatic thickening/stranding that most likely represents acute pancreatitis. Moderate thickening/stranding has redeveloped adjacent to the region of the gastric  outlet and duodenum, which was also present in May 2024 and which likely represents an additional manifestation of the pancreatitis.  - No abdominal pain, and lipase is normal.   - Per medicine on admission, defer treatment for pancreatitis at this time, in favor of treating suspected UTI and Acute on chronic HFrEF exacerbation.  Plan:  > Trend CMP  > Continue PPI    RENAL  AKI on CKD IV  - Baseline creatinine ~2.4, noted creatinine up to 3.66   - Renal following  Plan:  > Diuresis as above     ID  C/f UTI  - Afebrile; WBC 10.3 on admit  - UA concerning for UTI on admission  - Urine culture with mixed contaminants   - Received ceftriaxone 1/13 -1/16  Plan:  > Culture with fever  > Urine culture unremarkable, will dc rocephin  > Continue PTA acyclovir for infection ppx with ongoing chemotherapy    HEME  Multiple Myeloma  Chronic Anemia  Chronic Anticoagulation  - Hgb 8.4, plts 272 on admission  - Follows at [facility] and currently on Daratumumab q28days   > States that she has missed 2-3 rounds of treatment dt hospitalization  - Continue PTA acyclovir for prophylaxis  Plan:  > Apixiban as above     ENDO  DM type II  - Most recent hemoglobin A1c from 04/20/2023 was 6.3  > LDCF while admitted    SKIN/MSK    - No active concerns    FEN  > Magnesium goal >2.0, i-Cal goal > 1.0, Potassium goal >4.0 mEq/L; replace electrolytes as needed    Prophylaxis: PPI, Apixiban  Code: Full   Disposition: Downgrade to Tele    Review of Systems:  Raven [name] is sleepy at time of exam this morning but rouses to noxious stimuli. Her son has been at bedside overnight. She denies overnight concern or complaints. She currently denies feeling peripheral edema, ABD bloating, n/v, SOA, PND, chest pain. No fever, chills.     A comprehensive review of systems was negative except for: As listed above.    Past Medical History:    Anemia    Anxiety    Arthritis    Asthma    Asymptomatic stenosis of right carotid artery    Atrial fibrillation (HCC)    Back pain    Cancer of uterus (HCC)    Chronic kidney disease    COPD (chronic obstructive pulmonary disease) (HCC)    Coronary artery disease    Diabetes mellitus (HCC)    Dyslipidemia    Flank mass    Fluid retention    GI bleed    Heart disease    Heart failure (HCC)    Hypertension    Multiple myeloma (HCC)    OSA on CPAP    Other malignant neoplasm without specification of site    Vision decreased     Surgical History:   Procedure Laterality Date    CORONARY ARTERY BYPASS GRAFT  2010    CABG 3 V    ROBOT ASSISTED LOW ANTERIOR RESECTION N/A 08/10/2022    Performed by Benetta Spar, MD at [facility] OR    ESOPHAGOGASTRODUODENOSCOPY WITH BIOPSY - FLEXIBLE N/A 09/17/2022    Performed by Tempie Hoist, DO at [facility] ENDO    CATHETERIZATION RIGHT HEART N/A 09/18/2022    Performed by Cath, Physician at [facility] CATH LAB    REMOVAL AND REPLACEMENT IMPLANTABLE DEFIBRILLATOR GENERATOR - SINGLE LEAD SYSTEM Left 11/04/2022  Performed by Kathreen Cornfield, MD at [facility] EP LAB    INSERTION/ REPLACEMENT PERMANENT PACEMAKER WITH ATRIAL LEAD Left 11/04/2022    Performed by Kathreen Cornfield, MD at [facility] EP LAB    Injection Venography Extremity Left 11/04/2022    Performed by Kathreen Cornfield, MD at [facility] EP LAB    ESOPHAGOGASTRODUODENOSCOPY WITH BIOPSY - FLEXIBLE N/A 03/29/2023    Performed by Buckles, Vinnie Level, MD at [facility] ENDO    ESOPHAGOGASTRODUODENOSCOPY WITH SNARE REMOVAL TUMOR/ POLYP/ OTHER LESION - FLEXIBLE N/A 03/29/2023    Performed by Buckles, Vinnie Level, MD at [facility] ENDO    ESOPHAGOGASTRODUODENOSCOPY WITH CONTROL OF BLEEDING - FLEXIBLE N/A 04/04/2023    Performed by Buckles, Vinnie Level, MD at [facility] ENDO    COLONOSCOPY WITH BIOPSY - FLEXIBLE N/A 04/30/2023    Performed by Onnie Boer, MD at [facility] ENDO    BONE MARROW BIOPSY      CARDIAC DEFIBRILLATOR PLACEMENT      [employer]    CAROTID ENDARDECTOMY Right     COLONOSCOPY      HX CORONARY STENT PLACEMENT      HX HEART CATHETERIZATION      HX HYSTERECTOMY      TUNNELED VENOUS PORT PLACEMENT Right     Chest     Family History   Problem Relation Name Age of Onset    Cancer Mother      Diabetes Mother      Hypertension Mother      Cancer-Breast Sister      Cancer-Ovarian Sister      Cancer Sister      Diabetes Sister      Heart Disease Sister      Hypertension Sister      Heart Disease Brother      Hypertension Brother      Stroke Brother       Social History     Socioeconomic History    Marital status: Widowed   Tobacco Use    Smoking status: Former     Current packs/day: 0.00     Average packs/day: 1 pack/day for 20.0 years (20.0 ttl pk-yrs)     Types: Cigarettes     Start date: 7     Quit date: 2010     Years since quitting: 15.0     Passive exposure: Never    Smokeless tobacco: Never   Vaping Use    Vaping status: Never Used   Substance and Sexual Activity    Alcohol use: Not Currently    Drug use: Not Currently      Physical Exam:    General Appearance: no acute distress, obese  Skin: warm and dry  Lips & Oral Mucosa: no pallor or cyanosis   Digits and Nails: normal color; smooth symmetric nails and digits  Eyes: conjunctivae and lids normal  Neck Veins: CVP 11 via right IJ PAC  Auscultation/Percussion: breathing comfortably; lungs clear to auscultation; no rales, rhonchi, or wheezing  Cardiac Auscultation: Regular rhythm, S1, S2 normal; no S3 or S4; systolic murmur   Radial Arteries: normal symmetric radial pulses  Pedal Pulses: pulses 2+, symmetric  Lower Extremity Edema: Trace  Abdominal Exam: abdomen soft, non-tender; bowel sounds normoactive; no hepatomegaly  Gait & Station: Not observed  Orientation: clear historian, good insight    Objective:  Allergies:   Allergies   Allergen Reactions    Bortezomib RASH     Noted in Provider note 8/22 cancer treatment drug  Allopurinol HIVES    Sulfa (Sulfonamide Antibiotics) NAUSEA ONLY        Medications:  Scheduled Meds:albuterol sulfate (PROAIR HFA) inhaler 2 puff, 2 puff, Inhalation, BID & PRN  amiodarone (CORDARONE) tablet 200 mg, 200 mg, Oral, QDAY  benzonatate (TESSALON PERLES) capsule 100 mg, 100 mg, Oral, TID  budesonide-formoterol HFA (SYMBICORT) 160-4.5 mcg/actuation inhalation 2 puff, 2 puff, Inhalation, BID  bumetanide (BUMEX) injection 4 mg, 4 mg, Intravenous, BID(9-17)  lidocaine (LIDODERM) 5 % topical patch 1 patch, 1 patch, Topical, QDAY  scopolamine (TRANSDERM-SCOP) 1mg  over 3 days patch 1 patch, 1 patch, Transdermal, Q72H*   And  Verification of Patch Placement and Integrity - Scopolamine base 1 mg/3 days 1 patch, 1 patch, Transdermal, BID  sertraline (ZOLOFT) tablet 50 mg, 50 mg, Oral, QDAY    Continuous Infusions:   milrinone (PRIMACOR) 20 mg/D5W 100 mL infusion 0.3 mcg/kg/min (05/12/23 1950)     PRN and Respiratory Meds:acetaminophen Q8H PRN, dextromethorphan-guaiFENesin Q4H PRN, dextrose 50% (D50) IV PRN, diphenhydrAMINE HCL Q4H PRN **OR** diphenhydrAMINE HCL Q4H PRN, eucalyptus-menthoL Q2H PRN, melatonin QHS PRN, nitroglycerin Q5 MIN PRN, ondansetron Q6H PRN **OR** ondansetron (ZOFRAN) IV Q6H PRN, ondansetron (ZOFRAN) IV Q6H PRN, polyethylene glycol 3350 QDAY PRN, sennosides-docusate sodium QDAY PRN    Medications Prior to Admission   Medication Sig Dispense Refill Last Dose/Taking    acetaminophen (TYLENOL) 325 mg tablet Take three tablets by mouth every 8 hours as needed for Pain. Indications: pain 40 tablet 0 Past Week    acyclovir (ZOVIRAX) 400 mg tablet Take one tablet by mouth twice daily.   05/09/2023 Evening    albuterol sulfate (PROAIR HFA) 90 mcg/actuation HFA aerosol inhaler Inhale two puffs by mouth into the lungs every 6 hours as needed for Wheezing or Shortness of Breath.   Past Week    amiodarone (CORDARONE) 200 mg tablet Take one tablet by mouth daily. Indications: prevention of recurrent atrial fibrillation 90 tablet 1 05/09/2023 Morning    apixaban (ELIQUIS) 5 mg tablet Take one tablet by mouth twice daily. Indications: afib 60 tablet 0 05/09/2023 Evening    budesonide-formoterol HFA (SYMBICORT) 160-4.5 mcg/actuation aerosol inhaler Inhale two puffs by mouth into the lungs twice daily. Indications: bronchospasm prevention with COPD 30.6 g 0 05/09/2023 Evening    cholecalciferol (VITAMIN D) 1,000 units tablet Take one tablet by mouth daily.   05/09/2023 Morning    cyanocobalamin (VITAMIN B-12) 1,000 mcg tablet Take 5,000 Units by mouth daily.   05/09/2023 Morning    dexAMETHasone (DECADRON) 4 mg tablet Take two tablets by mouth every 7 days. Patient is taking 8mg  on Thursdays prior to chemo (Patient not taking: Reported on 05/10/2023)   Not Taking    dextromethorphan-guaiFENesin (ROBITUSSIN-DM) 10-100 mg/5 mL oral syrup Take 10 mL by mouth every 4 hours as needed. Indications: cough 120 mL 0 Past Month    EMU OIL (BULK) MISC Use  as directed. Apply topically as needed   Past Week    febuxostat (ULORIC) 40 mg tablet Take one-half tablet by mouth daily. Indications: taken on chemo days 45 tablet 0 05/09/2023 Morning    ferrous sulfate (FEOSOL) 325 mg (65 mg iron) tablet Take one tablet by mouth daily. Take on an empty stomach at least 1 hour before or 2 hours after food.   05/09/2023 Morning    magnesium oxide (MAGOX) 400 mg (241.3 mg magnesium) tablet Take one tablet by mouth at bedtime daily. Indications: low amount of magnesium in the blood 180 tablet 3 05/09/2023 Bedtime  milrinone (PRIMACOR) 200 mcg/mL infusion 0.3 mcg/kg/min ? 78.3 kg continuous  Indications: sudden and serious symptoms of heart failure called acute decompensated heart failure   05/10/2023 Morning    nitroglycerin (NITROSTAT) 0.4 mg tablet Place one tablet under tongue every 5 minutes as needed for Chest Pain.   Unknown    pantoprazole DR (PROTONIX) 40 mg tablet Take one tablet by mouth twice daily. Indications: gastritis 112 tablet 0 05/09/2023 Evening    potassium chloride SR (K-DUR) 20 mEq tablet Take two tablets by mouth twice daily. Indications: low amount of potassium in the blood 120 tablet 2 05/09/2023 Evening    rosuvastatin (CRESTOR) 20 mg tablet Take one-half tablet by mouth daily. Indications: excessive fat in the blood 90 tablet 0 05/09/2023 Morning    sertraline (ZOLOFT) 50 mg tablet Take one tablet by mouth daily.   05/09/2023 Morning    torsemide (DEMADEX) 20 mg tablet Take two tablets by mouth twice daily. Indications: accumulation of fluid resulting from chronic heart failure 120 tablet 0 05/09/2023 Morning                             Vital Signs:  Last Filed                Vital Signs: 24 Hour Range   BP: 119/68 (01/16 0600)  Temp: 36.2 ?C (97.1 ?F) (01/16 0400)  Pulse: 85 (01/16 0600)  Respirations: 18 PER MINUTE (01/16 0600)  SpO2: 97 % (01/16 0600)  O2 Device: Nasal cannula (01/16 0400)  O2 Liter Flow: 2 Lpm (01/16 0400)  Height: 157.5 cm (5' 2) (01/15 2200)  BP: (91-128)/(49-94)   ABP: (102)/(79)   Temp:  [36.2 ?C (97.1 ?F)-36.8 ?C (98.3 ?F)]   Pulse:  [85-93]   Respirations:  [8 PER MINUTE-30 PER MINUTE]   SpO2:  [92 %-100 %]   O2 Device: Nasal cannula  O2 Liter Flow: 2 Lpm             Intake/Output Summary:  (Last 24 hours)    Intake/Output Summary (Last 24 hours) at 05/13/2023 0604  Last data filed at 05/13/2023 0600  Gross per 24 hour   Intake 950.6 ml   Output 1000 ml   Net -49.4 ml       Wt Readings from Last 10 Encounters:   05/13/23 70.1 kg (154 lb 9.6 oz)   05/06/23 74.2 kg (163 lb 9.6 oz)   04/23/23 76 kg (167 lb 9.6 oz)   04/15/23 77.6 kg (171 lb)   04/06/23 75.9 kg (167 lb 5.3 oz)   03/26/23 74.2 kg (163 lb 9.3 oz)   03/19/23 77 kg (169 lb 12.8 oz)   02/16/23 75.3 kg (166 lb)   02/15/23 75.3 kg (166 lb)   01/04/23 75.2 kg (165 lb 12.8 oz)     I have seen, personally fully evaluated, and discussed this patient with Dr. Hedda Slade and the Advanced HF team.      Rodney Booze, APRN-NP  Cardiology Critical Care  Available on [facility]   05/13/2023

## 2023-05-13 NOTE — Progress Notes
Nephrology noted transition to [facility].  Will sign off at this time. Please re-engage [place] with questions.

## 2023-05-14 MED ORDER — SCOPOLAMINE BASE 1 MG OVER 3 DAYS TD PT3D
1 | TRANSDERMAL | 0 refills | Status: AC
Start: 2023-05-14 — End: ?

## 2023-05-14 MED ORDER — DEXTROMETHORPHAN-GUAIFENESIN 10-100 MG/5 ML PO SYRP
10 mL | ORAL | 0 refills | Status: AC | PRN
Start: 2023-05-14 — End: ?

## 2023-05-14 MED ORDER — ACETAMINOPHEN 500 MG PO TAB
1000 mg | ORAL | 0 refills | Status: AC | PRN
Start: 2023-05-14 — End: ?

## 2023-05-14 MED ORDER — HYDROMORPHONE 1 MG/ML PO LIQD
1 mg | ORAL | 0 refills | 8.00000 days | Status: AC | PRN
Start: 2023-05-14 — End: ?

## 2023-05-14 MED ORDER — GLYCOPYRROLATE 0.2 MG/ML IJ SOLN
.4 mg | INTRAVENOUS | 0 refills | 90.00000 days | Status: AC | PRN
Start: 2023-05-14 — End: ?

## 2023-05-14 MED ORDER — BUMETANIDE 0.25 MG/ML IJ SOLN
4 mg | Freq: Two times a day (BID) | INTRAVENOUS | 0 refills | Status: AC
Start: 2023-05-14 — End: ?

## 2023-05-14 MED ORDER — ALBUTEROL SULFATE 90 MCG/ACTUATION IN HFAA
2 | RESPIRATORY_TRACT | 0 refills | Status: AC | PRN
Start: 2023-05-14 — End: ?

## 2023-05-14 MED ORDER — LORAZEPAM 2 MG/ML IJ SOLN GROUP
.5 mg | INTRAVENOUS | 0 refills | 12.00000 days | Status: AC | PRN
Start: 2023-05-14 — End: ?

## 2023-05-14 MED ORDER — BUDESONIDE-FORMOTEROL 160-4.5 MCG/ACTUATION IN HFAA
2 | Freq: Every day | RESPIRATORY_TRACT | 0 refills | 30.00000 days | Status: AC
Start: 2023-05-14 — End: ?

## 2023-05-14 MED ORDER — POLYETHYLENE GLYCOL 3350 17 GRAM PO PWPK
17 g | Freq: Every day | ORAL | 0 refills | 22.00000 days | Status: AC | PRN
Start: 2023-05-14 — End: ?

## 2023-05-14 MED ORDER — MELATONIN 5 MG PO TAB
5 mg | Freq: Every evening | ORAL | 0 refills | 28.00000 days | Status: AC | PRN
Start: 2023-05-14 — End: ?

## 2023-05-14 MED ORDER — HALOPERIDOL LACTATE 2 MG/ML PO CONC
1 mg | ORAL | 0 refills | Status: AC | PRN
Start: 2023-05-14 — End: ?

## 2023-05-14 MED ORDER — HYDROMORPHONE (PF) 2 MG/ML IJ SYRG
.2 mg | INTRAVENOUS | 0 refills | 8.00000 days | Status: AC | PRN
Start: 2023-05-14 — End: ?

## 2023-05-14 MED ORDER — HALOPERIDOL LACTATE 5 MG/ML IJ SOLN
1 mg | INTRAVENOUS | 0 refills | Status: AC | PRN
Start: 2023-05-14 — End: ?

## 2023-05-14 MED ORDER — SENNOSIDES-DOCUSATE SODIUM 8.6-50 MG PO TAB
1 | Freq: Every day | ORAL | 0 refills | Status: AC | PRN
Start: 2023-05-14 — End: ?

## 2023-05-14 MED ORDER — ONDANSETRON 4 MG PO TBDI
4 mg | ORAL | 0 refills | 8.00000 days | Status: AC | PRN
Start: 2023-05-14 — End: ?

## 2023-05-14 NOTE — Progress Notes
Heart Failure Progress Note    NAME:Raven [name]                                                                   MRN: [id number]                 DOB:Aug 21, 1945          AGE: 78 y.o.  ADMISSION DATE: 05/10/2023             DAYS ADMITTED: LOS: 4 days      Chief Complaint:  Evaluation and recommendations re: heart failure.        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     She presented to the ED on 05/10/23 with complaints of not feeling well, dyspnea, dry cough, nausea/vomiting and dysuria.  Her NT-pro BNP was elevated at 15065 (previously 11379) and her troponins were slightly elevated at 49>40>39.  Her UA was suspicious for UTI, she was started on ceftriaxone.  CXR revealed volume overload with slightly improved pulmonary edema.  Her creatinine was slightly elevated from her baseline as well.  Unfortunately, this is her 6th hospitalization over the last 6 weeks and 9th admission over the past 9 months.  She was admitted to medicine with palliative and Heart Failure consults to assist with management.    She underwent RHC on 1/15 with RA 19, RV 72/7, PA 72/34(47), PCWP 36 v-wave 49, PA sat 38%, CO 3.25, CI 1.85 by fick. PAC was left in place and transferred to CICU for aggressive diuresis and symptom management.     Plan Today 05/14/2023:  Palliative care following. Pt CMO as of 1/16.  Dc to hospice house TBD.   ICD therapies off 1/16.    Milrinone discontinued on 1/16.  Continue IV diuresis for symptom management. IV Bumex 4 mg BID.  Cont CMO meds per Palliative care  Cont PTA amiodarone, inhalers and sertraline    CICU Course:  1/15 - Transfer to CICU with leave in [facility]; continue PTA milrinone, aggressive IV diuresis   1/16 - Remove PAC and sheath, Tele, re-engage hospice, IV diuresis, CMO, ICD therapies off, Milrinone off  1/17 - Cont CMO, hospice house referral sent    Assessment/Plan:   CARDIAC  Chronic cardiogenic shock  Acute on chronic systolic and diastolic HFrEF,  EF: 30%.  Ischemic cardiomyopathy  - Major Complications or Comorbidities [facility]): cardiogenic shock, acute/ acute on chronic systolic and/or diastolic heart failure, and cardiorenal syndrome (with documentation of CKD)  - NYHA functional class III (marked limitation of physical activity - comfortable at rest, but less than ordinary activity causes symptoms of HF e.g., getting dressed or standing from a sitting position),   - ACC Stage D (refractory HF requiring specialized interventions)  - Admission BNP:   Lab Results   Component Value Date    NTPROBNP 15,065 (H) 05/10/2023       Diuretic Therapy     Prior to admission dose    Torsemide 40mg  po BID   Given on admission    1/13: lasix 80mg  IV x 1; bumex 3mg  IV BID   Daily Dosing 1/14: hold with bump in creatinine  1/15: hold with further bump in creatinine, await RHC  1/15 PM: 4mg  IV Bumex  1/16: IV Bumex 4mg  BID, diuril 500 mg once  1/17: IV Bumex 4mg  BID      Guideline Directed Medical Therapy PTA Inpatient Changes   ACEI/ARB N/A-CKD IV      ARNI N/A-CKD IV      BB N/A-cardiogenic shock on home inotrope; previously tolerated Toprol XL which was discontinued recently     SGLT-2 Inhibitor None with CKD (dc'd mid-December), now with UTI     Mineralocorticoid Receptor Antagonist N/A-CKD IV      Hydralazine/Nitrate none     Ivabradine none     Heart Rhythm Management Therapy Yes (ICD)      Anticoagulation for Afib/flutter Elqiuis 5mg  po BID Held for RHC, resumed 1/15 PM  1/16 Dc w/ CMO   Cardiac Rehab Evaluation for LVEF < or equal to 40% Recently ordered 04/12/23     7-Day post hospital follow up scheduled within 24-48 hours of discharge No, expected to dc on Hospice           Testing/Procedures   05/12/23 RHC  HEMODYNAMICS:    Body surface area 1.75 sq m.  Hemoglobin 8.4 g/dL.  Heart rate 88 beats per minute.  Blood pressure 132/62 mmHg with mean pressure of 88 mmHg.     SATURATIONS:    AO saturation 97%.  RA saturation 39%.  PA saturation 38%.     PRESSURES:    RA pressure 19 mmHg.  RV pressure 72/7 mmHg with RVEDP of 19 mmHg.  PA pressure 72/34 mmHg with mean PA pressure of 47 mmHg.  Pulmonary capillary wedge pressure 36 mmHg with V-wave of 49 mmHg.  Transpulmonary gradient 11 mmHg.  Pulmonary vascular resistance 3.38 Wood units.  Systemic vascular resistance 1698 dynes/sec/cm -5.  Cardiac output utilizing Fick method was 3.25 L/minute and utilizing thermodilution method was 3.5 L/minute.  Cardiac index utilizing Fick method was 1.85 L/minute per sq m and utilizing thermodilution method was 2 L/minute per sq m.     FLUOROSCOPY TIME:  0.7 minutes.     AIR KERMA:  5.4 mGy.      My attending, Dr. Chales Abrahams, and interventional fellow, Dr. Carmelina Peal, were present for the entire case performing when necessary  key portions of the procedure.     FINAL IMPRESSION:    Significant elevated right and left-sided filling pressures.  Reduced cardiac indices.     RECOMMENDATIONS:  The results were conveyed to referring physician, Dr. [name], who recommended leaving the [facility] in and admitting the patient to the CICU for further optimization.      Last PTA Echo 04/12/2023  Eccentric hypertrophy of the left ventricle with severe dilatation of the left ventricular chamber  Left ventricular systolic function is moderately to severely impaired with an estimate ejection fraction of 30% and global hypokinesis  Right ventricle is moderately dilated with normal function  The left atrium is severely dilated  Pacemaker lead seen in the right atrium and right ventricle  Severe mitral valve insufficiency  Severe tricuspid valve insufficiency  No pericardial effusion  Severe pulmonary hypertension with estimated peak systolic pulmonary artery pressure of 73 mmHg     In comparison to previous echocardiogram from May 2024 the mitral insufficiency was graded as moderate to severe although on my visual comparison it appears similar (severe) to the present study        - 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 0.3 mcg/kg/min (this was started in May 2025 after low cardiac index on right heart cath)  - GDMT limited by CKD and shock  - Initially received IV diuretics, held due to bump in creatinine until results of cath  - RHC 1/15 with elevated filling pressures and low cardiac index  - 9 hospitalizations in last 9 months, with 6 of those in last 6 weeks  - Palliative following, she continues to have aggressive care goals, finds benefit from symptom management during hospitalizations  Plan:  > Diuresis as above  > GDMT as above   > Palliative care following. Pt CMO as of 1/16.  Dc to hospice house TBD.   ICD therapies off 1/16.    > Milrinone discontinued on 1/16.    Paroxysmal A-fib  ICD  - ICD interrogation 04/29/23: AP <1%/VP <1% with presenting rhythm AS-VS 90s with elevated HF diagnostics   > Cont PTA amiodarone  > Dc PTA Eliquis w/ CMO    CAD s/p CABG x 3 (2010)  HLD  > Dc statin w/ CMO  > ASA and Plavix DC'd due to GIB    NEURO   Acute metabolic encephalopathy, improving  - Family reports intermittent confusion during this hospitalization (forgetting family members names), possibly related to UTI  - MAE; follows commands  Plan:  > Maintain sleep wake cycle  > Can use Melatonin as needed     PULM  COPD  - Oxygenating well on 2L NC  Plan:  > O2 as needed for sat > 92%  > PTA albuterol, symbicort      GI  Elevated LFTs  Pancreatic Cyst  - possibly in setting of shock  - CT abdomen/pelvis from 04/02/2023 revealed mild increase in size of indeterminant large lobulated cystic mass in the tail of the pancreas   - CT Abdomen/Pelvis 1/13: Mild peripancreatic thickening/stranding that most likely represents acute pancreatitis. Moderate thickening/stranding has redeveloped adjacent to the region of the gastric outlet and duodenum, which was also present in May 2024 and which likely represents an additional manifestation of the pancreatitis.  - No abdominal pain, and lipase is normal.   - Per medicine on admission, defer treatment for pancreatitis at this time, in favor of treating suspected UTI and Acute on chronic HFrEF exacerbation.  Plan:  > Trend  CMP  > Continue PPI    RENAL  AKI on CKD IV  - Baseline creatinine ~2.4, noted creatinine up to 3.66   - Renal following  Plan:  > Diuresis as above     ID  C/f UTI  - Afebrile; WBC 10.3 on admit  - UA concerning for UTI on admission  - Urine culture with mixed contaminants   - Received ceftriaxone 1/13 -1/16  Plan:  > Culture with fever  > Urine culture unremarkable, dc rocephin  > Dc PTA acyclovir w/ CMO    HEME  Multiple Myeloma  Chronic Anemia  Chronic Anticoagulation  - Hgb 8.4, plts 272 on admission  - Follows at [facility] and currently on Daratumumab q28days   > States that she has missed 2-3 rounds of treatment dt hospitalization  - Continue PTA acyclovir for prophylaxis  Plan:  > Dc PTA acyclovir w/ CMO     ENDO  DM type II  - Most recent hemoglobin A1c from 04/20/2023 was 6.3  > Dc LDCF w/ CMO    SKIN/MSK    - No active concerns    FEN  > Magnesium goal >2.0, i-Cal goal > 1.0, Potassium goal >4.0 mEq/L; replace electrolytes as needed    Prophylaxis: None, CMO  Code: CMO   Disposition: M/s    Review of Systems:  Raven [name] resting comfortably at time of assessment. She required several of her PRN CMO medications to assist with pain management and secretions overnight.     A comprehensive review of systems was negative except for: As listed above.    Past Medical History:    Anemia    Anxiety    Arthritis    Asthma    Asymptomatic stenosis of right carotid artery    Atrial fibrillation (HCC)    Back pain    Cancer of uterus (HCC)    Chronic kidney disease    COPD (chronic obstructive pulmonary disease) (HCC)    Coronary artery disease    Diabetes mellitus (HCC)    Dyslipidemia    Flank mass    Fluid retention    GI bleed    Heart disease    Heart failure (HCC)    Hypertension    Multiple myeloma (HCC)    OSA on CPAP    Other malignant neoplasm without specification of site    Vision decreased     Surgical History:   Procedure Laterality Date    CORONARY ARTERY BYPASS GRAFT  2010    CABG 3 V    ROBOT ASSISTED LOW ANTERIOR RESECTION N/A 08/10/2022    Performed by Benetta Spar, MD at [facility] OR    ESOPHAGOGASTRODUODENOSCOPY WITH BIOPSY - FLEXIBLE N/A 09/17/2022    Performed by Tempie Hoist, DO at [facility] ENDO    CATHETERIZATION RIGHT HEART N/A 09/18/2022    Performed by Cath, Physician at [facility] CATH LAB    REMOVAL AND REPLACEMENT IMPLANTABLE DEFIBRILLATOR GENERATOR - SINGLE LEAD SYSTEM Left 11/04/2022    Performed by Kathreen Cornfield, MD at [facility] EP LAB    INSERTION/ REPLACEMENT PERMANENT PACEMAKER WITH ATRIAL LEAD Left 11/04/2022    Performed by Kathreen Cornfield, MD at [facility] EP LAB    Injection Venography Extremity Left 11/04/2022    Performed by Kathreen Cornfield, MD at [facility] EP LAB    ESOPHAGOGASTRODUODENOSCOPY WITH BIOPSY - FLEXIBLE N/A 03/29/2023    Performed by Buckles, Vinnie Level, MD at [facility] ENDO    ESOPHAGOGASTRODUODENOSCOPY  WITH SNARE REMOVAL TUMOR/ POLYP/ OTHER LESION - FLEXIBLE N/A 03/29/2023    Performed by Buckles, Vinnie Level, MD at [facility] ENDO    ESOPHAGOGASTRODUODENOSCOPY WITH CONTROL OF BLEEDING - FLEXIBLE N/A 04/04/2023    Performed by Buckles, Vinnie Level, MD at [facility] ENDO    COLONOSCOPY WITH BIOPSY - FLEXIBLE N/A 04/30/2023    Performed by Onnie Boer, MD at [facility] ENDO    CATHETERIZATION RIGHT HEART-possible leave in-please call Dr. [name] with pressures prior to pulling PA cath N/A 05/12/2023    Performed by Harley Alto, MD at [facility] CATH LAB    BONE MARROW BIOPSY      CARDIAC DEFIBRILLATOR PLACEMENT      [employer]    CAROTID ENDARDECTOMY Right     COLONOSCOPY      HX CORONARY STENT PLACEMENT      HX HEART CATHETERIZATION      HX HYSTERECTOMY      TUNNELED VENOUS PORT PLACEMENT Right     Chest     Family History   Problem Relation Name Age of Onset    Cancer Mother      Diabetes Mother      Hypertension Mother      Cancer-Breast Sister      Cancer-Ovarian Sister      Cancer Sister      Diabetes Sister      Heart Disease Sister      Hypertension Sister      Heart Disease Brother      Hypertension Brother      Stroke Brother       Social History     Socioeconomic History    Marital status: Widowed   Tobacco Use    Smoking status: Former     Current packs/day: 0.00     Average packs/day: 1 pack/day for 20.0 years (20.0 ttl pk-yrs)     Types: Cigarettes     Start date: 31     Quit date: 2010     Years since quitting: 15.0     Passive exposure: Never    Smokeless tobacco: Never   Vaping Use    Vaping status: Never Used   Substance and Sexual Activity    Alcohol use: Not Currently    Drug use: Not Currently      Physical Exam: General Appearance: no acute distress, overweight  Skin: warm and dry  Lips & Oral Mucosa: no pallor or cyanosis   Digits and Nails: normal color; smooth symmetric nails and digits  Eyes: conjunctivae and lids normal  Neck Veins: gauze and tegaderm present at prior right IJ PAC site  Auscultation/Percussion: breathing comfortably; lungs clear over dim bilaterally to auscultation; no rales, rhonchi, or wheezing  Cardiac Auscultation: Regular rhythm, S1, S2 normal; no S3 or S4; systolic murmur   Radial Arteries: normal symmetric radial pulses  Pedal Pulses: pulses 2+, symmetric  Lower Extremity Edema: Trace  Abdominal Exam: abdomen soft, non-tender; bowel sounds normoactive; no hepatomegaly  Gait & Station: Not observed  Orientation: clear historian, good insight    Objective:  Allergies:   Allergies   Allergen Reactions    Bortezomib RASH     Noted in Provider note 8/22 cancer treatment drug    Allopurinol HIVES    Sulfa (Sulfonamide Antibiotics) NAUSEA ONLY        Medications:  Scheduled Meds:albuterol sulfate (PROAIR HFA) inhaler 2 puff, 2 puff, Inhalation, BID & PRN  amiodarone (CORDARONE) tablet 200 mg, 200 mg, Oral, QDAY  benzonatate (TESSALON PERLES) capsule 100 mg, 100 mg, Oral, TID  budesonide-formoterol HFA (SYMBICORT) 160-4.5 mcg/actuation inhalation 2 puff, 2 puff, Inhalation, BID  bumetanide (BUMEX) injection 4 mg, 4 mg, Intravenous, BID(9-17)  lidocaine (LIDODERM) 5 % topical patch 1 patch, 1 patch, Topical, QDAY  scopolamine (TRANSDERM-SCOP) 1mg  over 3 days patch 1 patch, 1 patch, Transdermal, Q72H*   And  Verification of Patch Placement and Integrity - Scopolamine base 1 mg/3 days 1 patch, 1 patch, Transdermal, BID  sertraline (ZOLOFT) tablet 50 mg, 50 mg, Oral, QDAY    Continuous Infusions:      PRN and Respiratory Meds:acetaminophen Q8H PRN, dextromethorphan-guaiFENesin Q4H PRN, dextrose 50% (D50) IV PRN, diphenhydrAMINE HCL Q4H PRN **OR** diphenhydrAMINE HCL Q4H PRN, eucalyptus-menthoL Q2H PRN, glycopyrrolate Q30 MIN PRN, haloperidol  (HALDOL) oral solution Q2H PRN **OR** haloperidol lactate Q2H PRN, HYDROmorphone Q1H PRN **OR** HYDROmorphone (DILAUDID) injection Q30 MIN PRN, LORazepam Q2H PRN **OR** LORazepam  (ATIVAN)  injection Q2H PRN, melatonin QHS PRN, nitroglycerin Q5 MIN PRN, ondansetron Q6H PRN **OR** ondansetron (ZOFRAN) IV Q6H PRN, polyethylene glycol 3350 QDAY PRN, sennosides-docusate sodium QDAY PRN    Medications Prior to Admission   Medication Sig Dispense Refill Last Dose/Taking    acetaminophen (TYLENOL) 325 mg tablet Take three tablets by mouth every 8 hours as needed for Pain. Indications: pain 40 tablet 0 Past Week    acyclovir (ZOVIRAX) 400 mg tablet Take one tablet by mouth twice daily.   05/09/2023 Evening    albuterol sulfate (PROAIR HFA) 90 mcg/actuation HFA aerosol inhaler Inhale two puffs by mouth into the lungs every 6 hours as needed for Wheezing or Shortness of Breath.   Past Week    amiodarone (CORDARONE) 200 mg tablet Take one tablet by mouth daily. Indications: prevention of recurrent atrial fibrillation 90 tablet 1 05/09/2023 Morning    apixaban (ELIQUIS) 5 mg tablet Take one tablet by mouth twice daily. Indications: afib 60 tablet 0 05/09/2023 Evening    budesonide-formoterol HFA (SYMBICORT) 160-4.5 mcg/actuation aerosol inhaler Inhale two puffs by mouth into the lungs twice daily. Indications: bronchospasm prevention with COPD 30.6 g 0 05/09/2023 Evening    cholecalciferol (VITAMIN D) 1,000 units tablet Take one tablet by mouth daily.   05/09/2023 Morning    cyanocobalamin (VITAMIN B-12) 1,000 mcg tablet Take 5,000 Units by mouth daily.   05/09/2023 Morning    dexAMETHasone (DECADRON) 4 mg tablet Take two tablets by mouth every 7 days. Patient is taking 8mg  on Thursdays prior to chemo (Patient not taking: Reported on 05/10/2023)   Not Taking    dextromethorphan-guaiFENesin (ROBITUSSIN-DM) 10-100 mg/5 mL oral syrup Take 10 mL by mouth every 4 hours as needed. Indications: cough 120 mL 0 Past Month    EMU OIL (BULK) MISC Use  as directed. Apply topically as needed   Past Week    febuxostat (ULORIC) 40 mg tablet Take one-half tablet by mouth daily. Indications: taken on chemo days 45 tablet 0 05/09/2023 Morning    ferrous sulfate (FEOSOL) 325 mg (65 mg iron) tablet Take one tablet by mouth daily. Take on an empty stomach at least 1 hour before or 2 hours after food.   05/09/2023 Morning    magnesium oxide (MAGOX) 400 mg (241.3 mg magnesium) tablet Take one tablet by mouth at bedtime daily. Indications: low amount of magnesium in the blood 180 tablet 3 05/09/2023 Bedtime    milrinone (PRIMACOR) 200 mcg/mL infusion 0.3 mcg/kg/min ? 78.3 kg continuous  Indications: sudden and serious symptoms of heart failure called  acute decompensated heart failure   05/10/2023 Morning    nitroglycerin (NITROSTAT) 0.4 mg tablet Place one tablet under tongue every 5 minutes as needed for Chest Pain.   Unknown    pantoprazole DR (PROTONIX) 40 mg tablet Take one tablet by mouth twice daily. Indications: gastritis 112 tablet 0 05/09/2023 Evening    potassium chloride SR (K-DUR) 20 mEq tablet Take two tablets by mouth twice daily. Indications: low amount of potassium in the blood 120 tablet 2 05/09/2023 Evening    rosuvastatin (CRESTOR) 20 mg tablet Take one-half tablet by mouth daily. Indications: excessive fat in the blood 90 tablet 0 05/09/2023 Morning    sertraline (ZOLOFT) 50 mg tablet Take one tablet by mouth daily.   05/09/2023 Morning    torsemide (DEMADEX) 20 mg tablet Take two tablets by mouth twice daily. Indications: accumulation of fluid resulting from chronic heart failure 120 tablet 0 05/09/2023 Morning                           Vital Signs:  Last Filed                Vital Signs: 24 Hour Range   BP: 111/64 (01/17 0333)  Temp: 36.2 ?C (97.1 ?F) (01/17 1324)  Pulse: 80 (01/17 0333)  Respirations: 18 PER MINUTE (01/17 0333)  SpO2: 97 % (01/17 0444)  O2 Device: High flow nasal cannula (01/17 0444)  O2 Liter Flow: 2 Lpm (01/17 0444)  Height: 157.5 cm (5' 2) (01/17 0333)  BP: (106-117)/(59-74)   Temp:  [36.2 ?C (97.1 ?F)-36.5 ?C (97.7 ?F)]   Pulse:  [80-88]   Respirations:  [14 PER MINUTE-21 PER MINUTE]   SpO2:  [92 %-99 %]   O2 Device: High flow nasal cannula  O2 Liter Flow: 2 Lpm             Intake/Output Summary:  (Last 24 hours)    Intake/Output Summary (Last 24 hours) at 05/14/2023 0606  Last data filed at 05/14/2023 0333  Gross per 24 hour   Intake 686 ml   Output 1200 ml   Net -514 ml       Wt Readings from Last 10 Encounters:   05/14/23 70.1 kg (154 lb 8.7 oz)   05/06/23 74.2 kg (163 lb 9.6 oz)   04/23/23 76 kg (167 lb 9.6 oz)   04/15/23 77.6 kg (171 lb)   04/06/23 75.9 kg (167 lb 5.3 oz)   03/26/23 74.2 kg (163 lb 9.3 oz)   03/19/23 77 kg (169 lb 12.8 oz)   02/16/23 75.3 kg (166 lb)   02/15/23 75.3 kg (166 lb)   01/04/23 75.2 kg (165 lb 12.8 oz)     I have seen, personally fully evaluated, and discussed this patient with Dr. Hedda Slade and the Advanced HF team.      Rodney Booze, APRN-NP  Cardiology Critical Care  Available on [facility]   05/14/2023

## 2023-05-14 NOTE — Case Management (ED)
Case Management Progress Note    Palliative Care    NAME:Raven [name]                          MRN: [id number]              DOB:1945/06/02          AGE: 78 y.o.  ADMISSION DATE: 05/10/2023             DAYS ADMITTED: LOS: 4 days      Today's Date: 05/14/2023    PLAN: Patient will DC to [facility] today tonight at 8pm via [facility] stretcher transport [phone]     Expected Discharge Date: 05/14/2023   Is Patient Medically Stable: No, Please explain: Symptoms  Are there Barriers to Discharge? no    INTERVENTION/DISPOSITION:  Discharge Planning                 Palliative team rounded on Patient - no family at bedside.  Patient awakens to team - somewhat confused, denies major symptoms    Pall team PC with Patient's family - son and family request referral to [facility] house    SW submitted referral to [facility] - SW received confirmation that Patient is clinically accepted and can be accepted this evening    SW tasked CMA for delivery of transfer packet - appreciate support    SW tasked CMA to arrange stretcher transport - appreciate support.  [employer] to pay for transport, confirmed for 8pm tonight.  SW updated [employer] of DC time/plan    SW updated primary team of acceptance    Pall team PC with Patient's son - Son confirmed plan for Patient to transfer once room is available.  Pall team left signed DNR in Patient's paper chart for Patient's family to sign prior to DC.          Transportation              Does the Patient Need Case Management to Arrange Discharge Transport? (ex: facility, ambulance, wheelchair/stretcher, Medicaid, cab, other): No  Will the Patient Use Family Transport?: Yes  Support                     Info or Referral                 Positive SDOH Domains and Potential Barriers                   Medication Needs                                                 Financial                 Legal                 Other                 Discharge Disposition Selected Continued Care - Admitted Since 05/10/2023       [place] Destination Coordination complete.      Service Provider Services Address Phone Fax Patient Preferred    [facility] [address] [place] [address] [phone] --  Garnet Sierras, LMSW  [place]  Pager: [phone]  Office: [phone]

## 2023-05-14 NOTE — Discharge Instructions - Pharmacy
Discharge Summary      Name: Raven [name]  Medical Record Number: [id number]        Account Number:  [id number]  Date Of Birth:  1945-11-16                         Age:  78 y.o.  Admit date:  05/10/2023                     Discharge date: 05/14/2023      Discharge Attending: Dr. Lyndel Safe  Discharge Summary Completed By: Rodney Booze, APRN-NP    Service: Advanced Heart Failure Service - 2520    Reason for hospitalization:  UTI (urinary tract infection) [N39.0]    Primary Discharge Diagnosis:   Acute on Chronic HFrEF    Hospital Diagnoses:  Hospital Problems        Active Problems    * (Principal) Acute on chronic HFrEF (heart failure with reduced ejection   fraction) (HCC)    Multiple myeloma (HCC)    Pancreatic cyst    Receiving inotropic medication    Coronary artery disease involving native coronary artery of native heart   without angina pectoris    S/P drug eluting coronary stent placement    Type 2 diabetes mellitus without complications (HCC)    CKD (chronic kidney disease), stage IV (HCC)    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    Mitral valve insufficiency    Chronic obstructive pulmonary disease, unspecified (HCC)    Paroxysmal A-fib (HCC)    ICD (implantable cardioverter-defibrillator) in place    UTI (urinary tract infection)    Acute pancreatitis    AKI (acute kidney injury) (HCC)     Present on Admission:   UTI (urinary tract infection)   Multiple myeloma (HCC)   Pancreatic cyst   Receiving inotropic medication   S/P drug eluting coronary stent placement   Type 2 diabetes mellitus without complications (HCC)   CKD (chronic kidney disease), stage IV (HCC)   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   AKI (acute kidney injury) (HCC)   Coronary artery disease involving native coronary artery of native heart without angina pectoris   Mitral valve insufficiency   Chronic obstructive pulmonary disease, unspecified (HCC)   ICD (implantable cardioverter-defibrillator) in place Paroxysmal A-fib (HCC)   Acute on chronic HFrEF (heart failure with reduced ejection fraction) (HCC)      Significant Past Medical History        Anemia  Anxiety  Arthritis  Asthma  Asymptomatic stenosis of right carotid artery  Atrial fibrillation (HCC)  Back pain  Cancer of uterus (HCC)  Chronic kidney disease  COPD (chronic obstructive pulmonary disease) (HCC)  Coronary artery disease  Diabetes mellitus (HCC)  Dyslipidemia  Flank mass  Fluid retention  GI bleed      Comment:  multiple  Heart disease  Heart failure (HCC)  Hypertension  Multiple myeloma (HCC)  OSA on CPAP  Other malignant neoplasm without specification of site  Vision decreased    Allergies   Bortezomib, Allopurinol, and Sulfa (sulfonamide antibiotics)    Brief Hospital Course   The patient was admitted and the following issues were addressed during this hospitalization: (with pertinent details including admission exam/imaging/labs).      Raven [name] is a 78 y.o. 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      She presented to the ED on 05/10/23 with complaints of not feeling well, dyspnea, dry cough, nausea/vomiting and dysuria.  Her NT-pro BNP was elevated at 15065 (previously 11379) and her troponins were slightly elevated at 49>40>39.  Her UA was suspicious for UTI, she was started on ceftriaxone.  CXR revealed volume overload with slightly improved pulmonary edema.  Her creatinine was slightly elevated from her baseline as well.  Unfortunately, this is her 6th hospitalization over the last 6 weeks and 9th admission over the past 9 months.  She was admitted to medicine with palliative and Heart Failure consults to assist with management.     She underwent RHC on 1/15 with RA 19, RV 72/7, PA 72/34(47), PCWP 36 v-wave 49, PA sat 38%, CO 3.25, CI 1.85 by fick. PAC was left in place and transferred to CICU for aggressive diuresis and symptom management. Upon arrival to ICU, extensive discussion had regarded patient's recurrent admissions despite being on home inotrope and need for ongoing IV diuresis. The only procedures that could possibly contribute to better QOL are CRT-D upgrade or mitral valve intervention - neither of which the patient qualifies for. Her worsening kidney function also increases risk for complications on home milrinone including arrhythmia and hypotension.    Palliative care consulted. Patient and family deciding that she would no longer want to be hospitalized and pass peacefully whenever that time comes. On 1/16, [name] was made CMO, Milrinone discontinued and ICD therapies turned off. Her prognosis off of inotrope is days to weeks at this time. She is to discharge to [facility]. [facility] at 2000.     Last PTA Echo 04/12/2023  Eccentric hypertrophy of the left ventricle with severe dilatation of the left ventricular chamber  Left ventricular systolic function is moderately to severely impaired with an estimate ejection fraction of 30% and global hypokinesis  Right ventricle is moderately dilated with normal function  The left atrium is severely dilated  Pacemaker lead seen in the right atrium and right ventricle  Severe mitral valve insufficiency  Severe tricuspid valve insufficiency  No pericardial effusion  Severe pulmonary hypertension with estimated peak systolic pulmonary artery pressure of 73 mmHg     In comparison to previous echocardiogram from May 2024 the mitral insufficiency was graded as moderate to severe although on my visual comparison it appears similar (severe) to the present study    05/12/23 RHC  HEMODYNAMICS:    Body surface area 1.75 sq m.  Hemoglobin 8.4 g/dL.  Heart rate 88 beats per minute.  Blood pressure 132/62 mmHg with mean pressure of 88 mmHg.     SATURATIONS:    AO saturation 97%.  RA saturation 39%.  PA saturation 38%.     PRESSURES:    RA pressure 19 mmHg.  RV pressure 72/7 mmHg with RVEDP of 19 mmHg.  PA pressure 72/34 mmHg with mean PA pressure of 47 mmHg.  Pulmonary capillary wedge pressure 36 mmHg with V-wave of 49 mmHg.  Transpulmonary gradient 11 mmHg.  Pulmonary vascular resistance 3.38 Wood units.  Systemic vascular resistance 1698 dynes/sec/cm -5.  Cardiac output utilizing Fick method was 3.25 L/minute and utilizing thermodilution  method was 3.5 L/minute.  Cardiac index utilizing Fick method was 1.85 L/minute per sq m and utilizing thermodilution method was 2 L/minute per sq m.     FLUOROSCOPY TIME:  0.7 minutes.     AIR KERMA:  5.4 mGy.      My attending, Dr. Chales Abrahams, and interventional fellow, Dr. Carmelina Peal, were present for the entire case performing when necessary key portions of the procedure.     FINAL IMPRESSION:    Significant elevated right and left-sided filling pressures.  Reduced cardiac indices.     RECOMMENDATIONS:  The results were conveyed to referring physician, Dr. [name], who recommended leaving the [facility] in and admitting the patient to the CICU for further optimization.      Heart Failure Hospitalization Summary:     At Admission At Discharge   Weight 73.1 kg (161 lbs 3.2 oz) Wt Readings from Last 1 Encounters:   05/14/23 70.1 kg (154 lb 8.7 oz)      Diuretic Regimen PTA torsemide  40mg  twice a day : IV Bumex 4mg  BID      Creatinine 2.85 3.45   Net I/O for hospitalization   - 1,198 L   Inotrope therapy Milrinone @ 0.3 mcg/kg/min Milrinone dc 1/16          Guideline Directed Medical Therapy PTA Inpatient Changes   ACEI/ARB N/A-CKD IV      ARNI N/A-CKD IV      BB N/A-cardiogenic shock on home inotrope; previously tolerated Toprol XL which was discontinued recently     SGLT-2 Inhibitor None with CKD (dc'd mid-December), now with UTI     Mineralocorticoid Receptor Antagonist N/A-CKD IV      Hydralazine/Nitrate none     Ivabradine none     Heart Rhythm Management Therapy Yes (ICD)      Anticoagulation for Afib/flutter Elqiuis 5mg  po BID Held for RHC, resumed 1/15 PM  1/16 Dc w/ CMO   Cardiac Rehab Evaluation for LVEF < or equal to 40% Recently ordered 04/12/23     7-Day post hospital follow up scheduled within 24-48 hours of discharge No, expected to dc on Hospice           Items Needing Follow Up   Pending items or areas that need to be addressed at follow up: N/a    Pending Labs and Follow Up Radiology    Pending labs and/or radiology review at this time of discharge are listed below: Please note- any labs with collected status will not have a result; if this area is blank, there are no items for review.   Pending Labs       Order Current Status    POC GLUCOSE In process    POC GLUCOSE In process    POC GLUCOSE In process    POC GLUCOSE In process    POC GLUCOSE In process    POC GLUCOSE In process            Medications      Medication List      STOP taking these medications     acetaminophen 325 mg tablet; Commonly known as: TYLENOL   acyclovir 400 mg tablet; Commonly known as: ZOVIRAX   albuterol sulfate 90 mcg/actuation HFA aerosol inhaler; Commonly known   as: PROAIR HFA   amiodarone 200 mg tablet; Commonly known as: CORDARONE   apixaban 5 mg tablet; Commonly known as: ELIQUIS   budesonide-formoterol HFA 160-4.5 mcg/actuation aerosol inhaler;   Commonly known as: SYMBICORT  dexAMETHasone 4 mg tablet; Commonly known as: DECADRON   dextromethorphan-guaiFENesin 10-100 mg/5 mL oral syrup; Commonly known   as: ROBITUSSIN-DM   EMU OIL (BULK) MISC   febuxostat 40 mg tablet; Commonly known as: ULORIC   ferrous sulfate 325 mg (65 mg iron) tablet; Commonly known as: FEOSOL   magnesium oxide 400 mg (241.3 mg magnesium) tablet; Commonly known as:   MAGOX   milrinone 200 mcg/mL infusion; Commonly known as: PRIMACOR   nitroglycerin 0.4 mg tablet; Commonly known as: NITROSTAT   pantoprazole DR 40 mg tablet; Commonly known as: PROTONIX   potassium chloride SR 20 mEq tablet; Commonly known as: K-DUR   rosuvastatin 20 mg tablet; Commonly known as: CRESTOR   sertraline 50 mg tablet; Commonly known as: ZOLOFT   torsemide 20 mg tablet; Commonly known as: DEMADEX   VITAMIN B-12 1,000 mcg tablet; Generic drug: cyanocobalamin (vitamin   B-12)   VITAMIN D3 1,000 units tablet; Generic drug: CHOLEcalciferoL (vitamin   D3)       Return Appointments and Scheduled Appointments     Scheduled appointments:      May 21, 2023 10:45 AM  Follow-up visit with Fredricka Bonine, APRN-NP  Cardiovascular Medicine: [facility] (CVM Exam) [address]  [address]  [place] [address]  [phone]     Sep 16, 2023 1:30 PM  Office visit with Arneta Cliche, MD  Cardiovascular Medicine: [facility] (CVM Exam) [address]  [address]  [place] [address]  [phone]     Sep 17, 2023 2:30 PM  ICD Check with CMPB3 PACEMAKER  Cardiovascular Medicine: [facility], [address] (CVM Procedural) [phone] [address]  [address]  [place] [address]  [phone]     Sep 17, 2023 3:00 PM  Office visit with Smith Robert, MD  Cardiovascular Medicine: [facility], [address] (CVM Exam) [phone] [address]  [address]  [place] [address]  [phone]          Things you need to do       Call Barbaraann Rondo, MD in 3 days    As needed    Phone: [phone]    Where: [address], [place] [address]          Contact information for after-discharge care                [place] Destination       [facility]    Phone: [phone]    Fax: [phone]    Where: [address], [place] [address]    Service: Inpatient Hospice Consults, Procedures, Diagnostics, Micro, Pathology   Consults: Internal Medicine, Nephrology, and Palliative Care  Surgical Procedures & Dates: None  Significant Diagnostic Studies, Micro and Procedures: noted in brief hospital course  Significant Pathology: none                                   Discharge Disposition, Condition   Patient Disposition: Dc to [facility]  Condition at Discharge: Terminal, comfort measures    Code Status   DNAR-Comfort Measures Only    Patient Instructions     Activity       Activity as Tolerated   As directed      It is important to keep increasing your activity  level after you leave the hospital.  Moving around can help prevent blood clots, lung infection (pneumonia) and other problems.  Gradually increasing the number of times you are up moving around will help you return to your normal activity level more quickly.  Continue to increase the number of times you are up to the chair and walking daily to return to your normal activity level. Begin to work towards your normal activity level at discharge.          Diet       Regular   As directed               Discharge education provided to patient., Signs and Symptoms:   Report these signs and symptoms       Report These Signs and Symptoms   As directed      Please address concerns post-discharge with Hospice Team.        , Education: , and Others Instructions:   Other Orders       COVID-19 TESTING NOT REQUIRED   Complete by: As directed      Questions About Your Stay   Complete by: As directed      Discharging attending physician: Lyndel Safe    Order comments: For questions or concerns regarding your hospital stay:    - DURING BUSINESS HOURS (8:00 AM - 4:30 PM):  Call [phone] and asked to be transferred to your discharge attending physician.    - AFTER BUSINESS HOURS (4:30 PM - 8:00 AM, on weekends, or holidays):  Call [phone] and ask the operator to page the on-call doctor for the discharge attending physician.            Additional Orders: Case Management, Supplies, Home Health   N/a. Pt to dc to [facility]    Signed:  Rodney Booze, APRN-NP  05/14/2023      cc:  Primary Care Physician:  Barbaraann Rondo       Referring physicians:  Self, Referral   Additional provider(s):        Did we miss something? If additional records are needed, please fax a request on office letterhead to [phone]. Please include the patient's name, date of birth, fax number and type of information needed. Additional request can be made by email at [email or web address]. For general questions of information about electronic records sharing, call [phone].

## 2023-05-14 NOTE — Progress Notes
PALLIATIVE CARE INPATIENT NOTE     Name: Raven [name]            MRN: [id number]                DOB: April 16, 1946          Age: 78 y.o.  Admission Date: 05/10/2023             LOS: 4 days    ASSESSMENT/PLAN     Raven [name] is a 78 y.o. 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 Frequent hospitalizations here with worsening cough and SOA. Palliative Care consulted for complex medical decision making.     #Acute on chronic HFrE,  EF: 30%   - She follows with Dr. Herma Carson. [name] and [name], APRN  #Chronic cardiogenic shock on chronic milrinone  #Ischemic cardiomyopathy  #CAD s/p CABG and PCI  #CRT-D in situ  #PAF  #Acute pancreatitis/Pancreatic cystic mass   #UTI  #Multiple myeloma   - She follows with [facility] onc[facility] and currently on Daratumumab     #Goals of Care/Complex Medical Decision Making related to Heart Failure  Discussion:   Palliative team met with patient at bedside. She was very somnolent. She did wake up and answer yes/no questions, see below.     RECOMMENDATIONS:   DNAR-CMO plan of care - see ACP note  Continue Bumex 4mg  BID for SOA  Glyco prn secretions  Dilaudid 0.2mg  IV q43min prn   Haldol oral solution 1mg  q2h prn OR IV back up for restlessness  Ativan oral solution 0.5mg  q2h prn anxiety OR IV back up  Continue cough regimen of mucinex, robitussin, tessalon pearles  Signed OSH-DNR and left at bedside for son to sign when he arrives  ICD turned off 1/16  Discussed with [facility] medical director and he has accepted her for inpatient level of hospice care    PALLIATIVE CARE PLANNING     Advance Care Planning:   Identified Health Care Decision Maker -  Son Raven [name], Raven [name], Daughter Raven [name]   DPOA - Would benefit from completion of DPOA  TPOPP/OSHDNAR - Not discussed    PC Clinic - NA    Medication safety - NA    Disposition planning - [facility]    SUBJECTIVE     CC/Reason for Visit:goals, cough  Additional history from: chart    Interval Hx: Reports ongoing cough, and SOA. Controlled on current medications.       OBJECTIVE     Blood pressure 115/52, pulse 78, temperature 37.3 ?C (99.1 ?F), height 157.5 cm (5' 2), weight 70.1 kg (154 lb 8.7 oz), SpO2 97%.  Physical Exam  General: Somnolent, lethargic, lying in bed, appears chronically ill  HEENT: Eyes open, mostly closed during our visit  CV: Regular rate  Pulm: Non-labored  GI: Abd non-distended, non-tender to palpation, bowel sounds absent  Neuro: Alert, answering questions appropriately, no seizures, no myoclonus  Psych: Calm    Lab Results:  CBC   Lab Results   Component Value Date/Time    WBC 9.10 05/13/2023 03:49 AM    HGB 8.4 (L) 05/13/2023 03:49 AM  PLTCT 262 05/13/2023 03:49 AM     Lab Results   Component Value Date/Time    NEUT 66 05/11/2023 07:07 AM    ANC 8.2 (H) 05/13/2023 03:49 AM      Chemistries   Lab Results   Component Value Date/Time    NA 142 05/13/2023 03:49 AM    K 3.6 05/13/2023 03:49 AM    BUN 61 (H) 05/13/2023 03:49 AM    CR 3.45 (H) 05/13/2023 03:49 AM    GLU 160 (H) 05/13/2023 03:49 AM     Lab Results   Component Value Date/Time    CA 9.0 05/13/2023 03:49 AM    PO4 4.0 04/27/2023 06:29 PM    ALBUMIN 3.4 (L) 05/13/2023 03:49 AM    TOTPROT 5.6 (L) 05/13/2023 03:49 AM    ALKPHOS 115 (H) 05/13/2023 03:49 AM    AST 108 (H) 05/13/2023 03:49 AM    ALT 88 (H) 05/13/2023 03:49 AM    TOTBILI 0.8 05/13/2023 03:49 AM    GFR 13 (L) 05/13/2023 03:49 AM        Other Pertinent Diagnostic Results:           Palliative Care Data:  Patient Location at Time of Consultation: Hospital - General floor (includes step-down,  pre-op)  Primary Diagnosis: Cardiovascular: Heart Failure    High medical decision making due to the following:  1 or more chronic illness with severe exacerbation  Review of notes outside of my specialty and Review of each unique test and discussion of management or test interpretation with CICU APRN (physician(s) or other qualified health care professional outside of my specialty)  IV controlled substances

## 2023-05-14 NOTE — Progress Notes
HC5 END OF SHIFT/PLAN OF CARE NURSING NOTE   Nursing Shift: Night Shift 1900-0700    Acute events, nursing interventions, & communication with providers:     Patient transferred from [facility] to [facility] at shift change 1/16. Patient newly changed to CMO status. PRN Robinul, PRN Dilaudid given for secretions/productive cough.      Patient Goal(s)  Patient will Display signs of effective airway clearance by absence of dyspnea, respiratory rate within normal limits by the end of next shift.        Patient will  Report breathing has improved by discharge.   Admission Weight: Weight: 72.1 kg (159 lb)    Last 3 Weights:   Vitals:    05/12/23 0330 05/12/23 2200 05/13/23 0500   Weight: 74.1 kg (163 lb 6.4 oz) 76.1 kg (167 lb 12.3 oz) 70.1 kg (154 lb 9.6 oz)         Intake/Output Summary (Last 24 hours) at 05/14/2023 0028  Last data filed at 05/14/2023 0000  Gross per 24 hour   Intake 758 ml   Output 1600 ml   Net -842 ml     Last Bowel Movement Date:  (pta)    Fluid Restriction? No   Quality/Safety    Total Fall Risk Score: 14   Risk for Injury related to falls: Bones susceptible to fracture  Fall Risk Category:   History of More Than One Fall Within 6 Months Before Admission: No  Elimination, Bowel and Urine: N/A  Interventions: Commode at bedside  Medications: On 2 or more high fall risk drugs  Interventions: Bedside commode (i.e., urgency, frequency, dizziness) , Use of gait belt , and Bed/chair alarm (i.e., change in mental status)   Patient Care Equipment: Two present  Interventions: Ensure environment is free of clutter and walkways are clear from tripping hazards and Assess need for patient equipment and remove if not in use  Mobility: 2 - Assistance required, 2 - Unsteady gait  Interventions: Assist x2, Gait belt in use when ambulating, Utilize walker, cane, or additional walking aid for ambulation, and Elimination equipment at bedside (urinal or commode)   Cognition: 1 - Altered awareness of immediate physical environment  Interventions: Bed/Chair Alarm , Stay within arm's reach while patient ambulating/toileting/showering, and Increase frequency of purposeful rounding    Other safety precautions in place: HAPI Prevention Bundle    Restraints:  No      Patient Education  This RN provided education to Patient and Family today. The following education topics were reviewed:  Quality/Safety Education:   Fall risk, Pain scale, and Pressure injury prevention/care  Medication Education:   Pharmacological pain intervention  Education provided on the following medication(s): Dilaudid, Robinul  Cardiac - Specific Education:   Heart failure management  General Education:   Diet/nutrition and Discharge planning    The following teaching method(s) were used: Verbal  Response to learning: [employer]

## 2023-05-14 NOTE — Case Management (ED)
Case Management Progress Note    NAME:Raven [name]                          MRN: [id number]              DOB:Jul 02, 1945          AGE: 78 y.o.  ADMISSION DATE: 05/10/2023             DAYS ADMITTED: LOS: 4 days      Today's Date: 05/14/2023    PLAN: discharge tonight to [facility]. [facility].  Palliative SW arranging admission and transportation to [facility]    Expected Discharge Date: 05/14/2023   Is Patient Medically Stable: Yes   Are there Barriers to Discharge? no    INTERVENTION/DISPOSITION:  Discharge Planning              Discharge Planning: Home Health   NCM reviewed EmR and attended huddle.   NCM notified [facility] that patient would discharge to [facility]. [facility].  HH referral cancelled.    Transportation              Does the Patient Need Case Management to Arrange Discharge Transport? (ex: facility, ambulance, wheelchair/stretcher, Medicaid, cab, other): No  Will the Patient Use Family Transport?: Yes    Support                 Info or Referral                 Positive SDOH Domains and Potential Barriers                   Medication Needs                                Financial                 Legal                 Other                 Discharge Disposition                                             Selected Continued Care - Admitted Since 05/10/2023       [place] Destination Coordination complete.      Service Provider Services Address Phone Fax Patient Preferred    [facility] [address] [place] [address] [phone] --                  Rolan Lipa RN, MSN, CEN  Integrated Nurse Case Manager  [phone]  Office (preferred)  Available on [place] [phone]

## 2023-05-15 DIAGNOSIS — J4489 Other specified chronic obstructive pulmonary disease (HCC): Secondary | ICD-10-CM

## 2023-05-15 DIAGNOSIS — Z90710 Acquired absence of both cervix and uterus: Secondary | ICD-10-CM

## 2023-05-15 DIAGNOSIS — Z7901 Long term (current) use of anticoagulants: Secondary | ICD-10-CM

## 2023-05-15 DIAGNOSIS — I272 Pulmonary hypertension, unspecified: Secondary | ICD-10-CM

## 2023-05-15 DIAGNOSIS — I493 Ventricular premature depolarization: Secondary | ICD-10-CM

## 2023-05-15 DIAGNOSIS — I5043 Acute on chronic combined systolic (congestive) and diastolic (congestive) heart failure: Secondary | ICD-10-CM

## 2023-05-15 DIAGNOSIS — Z888 Allergy status to other drugs, medicaments and biological substances status: Secondary | ICD-10-CM

## 2023-05-15 DIAGNOSIS — Z87891 Personal history of nicotine dependence: Secondary | ICD-10-CM

## 2023-05-15 DIAGNOSIS — C9000 Multiple myeloma not having achieved remission: Secondary | ICD-10-CM

## 2023-05-15 DIAGNOSIS — D649 Anemia, unspecified: Secondary | ICD-10-CM

## 2023-05-15 DIAGNOSIS — Z823 Family history of stroke: Secondary | ICD-10-CM

## 2023-05-15 DIAGNOSIS — I081 Rheumatic disorders of both mitral and tricuspid valves: Secondary | ICD-10-CM

## 2023-05-15 DIAGNOSIS — Z8249 Family history of ischemic heart disease and other diseases of the circulatory system: Secondary | ICD-10-CM

## 2023-05-15 DIAGNOSIS — Z800 Family history of malignant neoplasm of digestive organs: Secondary | ICD-10-CM

## 2023-05-15 DIAGNOSIS — Z66 Do not resuscitate: Secondary | ICD-10-CM

## 2023-05-15 DIAGNOSIS — Z9049 Acquired absence of other specified parts of digestive tract: Secondary | ICD-10-CM

## 2023-05-15 DIAGNOSIS — N184 Chronic kidney disease, stage 4 (severe): Secondary | ICD-10-CM

## 2023-05-15 DIAGNOSIS — R570 Cardiogenic shock: Secondary | ICD-10-CM

## 2023-05-15 DIAGNOSIS — K862 Cyst of pancreas: Secondary | ICD-10-CM

## 2023-05-15 DIAGNOSIS — Z833 Family history of diabetes mellitus: Secondary | ICD-10-CM

## 2023-05-15 DIAGNOSIS — Z7951 Long term (current) use of inhaled steroids: Secondary | ICD-10-CM

## 2023-05-15 DIAGNOSIS — E1122 Type 2 diabetes mellitus with diabetic chronic kidney disease: Secondary | ICD-10-CM

## 2023-05-15 DIAGNOSIS — G9341 Metabolic encephalopathy: Secondary | ICD-10-CM

## 2023-05-15 DIAGNOSIS — Z515 Encounter for palliative care: Secondary | ICD-10-CM

## 2023-05-15 DIAGNOSIS — I255 Ischemic cardiomyopathy: Secondary | ICD-10-CM

## 2023-05-15 DIAGNOSIS — I130 Hypertensive heart and chronic kidney disease with heart failure and stage 1 through stage 4 chronic kidney disease, or unspecified chronic kidney disease: Secondary | ICD-10-CM

## 2023-05-15 DIAGNOSIS — Z951 Presence of aortocoronary bypass graft: Secondary | ICD-10-CM

## 2023-05-15 DIAGNOSIS — Z955 Presence of coronary angioplasty implant and graft: Secondary | ICD-10-CM

## 2023-05-15 DIAGNOSIS — E785 Hyperlipidemia, unspecified: Secondary | ICD-10-CM

## 2023-05-15 DIAGNOSIS — Z556 Problems related to health literacy: Secondary | ICD-10-CM

## 2023-05-15 DIAGNOSIS — I440 Atrioventricular block, first degree: Secondary | ICD-10-CM

## 2023-05-15 DIAGNOSIS — Z23 Encounter for immunization: Secondary | ICD-10-CM

## 2023-05-15 LAB — POC GLUCOSE: ~~LOC~~ BKR POC GLUCOSE: 155 mg/dL — ABNORMAL HIGH (ref 70–100)

## 2023-05-16 LAB — DEVICE EVALUATION - ICD (IMPLANTABLE CARDIOVERTER DEFIBRILLATOR)
EP A PACING%: 0 %
EP GENERATOR IMPLANT DATE: 202
EP GENERATOR SERIAL#: 810
EP RV PACING%: 0.5 %

## 2023-05-17 ENCOUNTER — Encounter: Admit: 2023-05-17 | Discharge: 2023-05-17 | Payer: MEDICARE

## 2023-05-18 ENCOUNTER — Encounter: Admit: 2023-05-18 | Discharge: 2023-05-18 | Payer: MEDICARE

## 2023-05-19 ENCOUNTER — Encounter: Admit: 2023-05-19 | Discharge: 2023-05-19 | Payer: MEDICARE

## 2023-05-20 ENCOUNTER — Encounter: Admit: 2023-05-20 | Discharge: 2023-05-20 | Payer: MEDICARE

## 2023-05-26 ENCOUNTER — Encounter: Admit: 2023-05-26 | Discharge: 2023-05-26 | Payer: MEDICARE

## 2023-05-28 ENCOUNTER — Encounter: Admit: 2023-05-28 | Discharge: 2023-05-28 | Payer: MEDICARE

## 2023-05-29 ENCOUNTER — Ambulatory Visit: Admit: 2023-05-29 | Discharge: 2023-05-29 | Payer: MEDICARE

## 2023-06-02 ENCOUNTER — Encounter: Admit: 2023-06-02 | Discharge: 2023-06-02 | Payer: MEDICARE

## 2023-06-02 NOTE — Progress Notes
Received fax from [facility] in right fax regarding home health orders. Signed and faxed back to fax number requested.

## 2023-06-15 ENCOUNTER — Encounter: Admit: 2023-06-15 | Discharge: 2023-06-15 | Payer: MEDICARE

## 2023-06-28 ENCOUNTER — Encounter: Admit: 2023-06-28 | Discharge: 2023-06-28 | Payer: MEDICARE

## 2023-06-30 ENCOUNTER — Encounter: Admit: 2023-06-30 | Discharge: 2023-06-30 | Payer: MEDICARE

## 2023-07-08 ENCOUNTER — Encounter: Admit: 2023-07-08 | Discharge: 2023-07-08 | Payer: MEDICARE

## 2023-07-08 NOTE — Progress Notes
 Received notification that  [employer] has not been connected since 07-05-2023. Patient was instructed to look at his/her transmitter to make sure that it is plugged into power and send a manual transmission to reconnect the transmitter. If he/she has any questions about how to send a transmission or if the transmitter does not appear to be working properly, they need to contact the device company directly. Patient was provided with that contact number. Requested the patient send [place] a MyChart message or contact our device nurses at [phone] to let us know after they have sent their transmission.MyChart sent CDJ     Note: Patient needs to send a manual remote interrogation to reestablish communication to his/her remote transmitter.

## 2023-07-14 ENCOUNTER — Encounter: Admit: 2023-07-14 | Discharge: 2023-07-14 | Payer: MEDICARE

## 2023-07-27 DEATH — deceased

## 2023-09-17 ENCOUNTER — Encounter: Admit: 2023-09-17 | Discharge: 2023-09-17 | Payer: MEDICARE
